# Patient Record
Sex: Female | Born: 1950 | Race: White | Hispanic: No | Marital: Married | Smoking: Current every day smoker
Health system: Southern US, Community
[De-identification: ages and names within clinical notes are randomized; demographics above are authoritative.]

## PROBLEM LIST (undated history)

## (undated) DIAGNOSIS — H9190 Unspecified hearing loss, unspecified ear: Secondary | ICD-10-CM

## (undated) DIAGNOSIS — I6529 Occlusion and stenosis of unspecified carotid artery: Secondary | ICD-10-CM

## (undated) DIAGNOSIS — K219 Gastro-esophageal reflux disease without esophagitis: Secondary | ICD-10-CM

## (undated) DIAGNOSIS — I639 Cerebral infarction, unspecified: Secondary | ICD-10-CM

## (undated) DIAGNOSIS — E785 Hyperlipidemia, unspecified: Secondary | ICD-10-CM

## (undated) DIAGNOSIS — D649 Anemia, unspecified: Secondary | ICD-10-CM

## (undated) DIAGNOSIS — G4730 Sleep apnea, unspecified: Secondary | ICD-10-CM

## (undated) DIAGNOSIS — I739 Peripheral vascular disease, unspecified: Secondary | ICD-10-CM

## (undated) DIAGNOSIS — E559 Vitamin D deficiency, unspecified: Secondary | ICD-10-CM

## (undated) DIAGNOSIS — E538 Deficiency of other specified B group vitamins: Secondary | ICD-10-CM

## (undated) HISTORY — DX: Peripheral vascular disease, unspecified: I73.9

## (undated) HISTORY — DX: Vitamin D deficiency, unspecified: E55.9

## (undated) HISTORY — PX: ABDOMINAL HYSTERECTOMY: SHX81

## (undated) HISTORY — DX: Gastro-esophageal reflux disease without esophagitis: K21.9

## (undated) HISTORY — DX: Anemia, unspecified: D64.9

## (undated) HISTORY — DX: Hyperlipidemia, unspecified: E78.5

## (undated) HISTORY — DX: Sleep apnea, unspecified: G47.30

## (undated) HISTORY — DX: Unspecified hearing loss, unspecified ear: H91.90

## (undated) HISTORY — PX: COLONOSCOPY: SHX174

## (undated) HISTORY — PX: TUBAL LIGATION: SHX77

## (undated) HISTORY — DX: Occlusion and stenosis of unspecified carotid artery: I65.29

## (undated) HISTORY — DX: Cerebral infarction, unspecified: I63.9

## (undated) HISTORY — DX: Deficiency of other specified B group vitamins: E53.8

---

## 2001-11-09 ENCOUNTER — Ambulatory Visit (HOSPITAL_COMMUNITY): Admission: RE | Admit: 2001-11-09 | Discharge: 2001-11-09 | Payer: Self-pay | Admitting: Orthopedic Surgery

## 2001-11-09 ENCOUNTER — Encounter: Payer: Self-pay | Admitting: Orthopedic Surgery

## 2015-05-09 ENCOUNTER — Encounter (HOSPITAL_COMMUNITY): Payer: Self-pay | Admitting: Anesthesiology

## 2015-05-09 ENCOUNTER — Emergency Department (HOSPITAL_COMMUNITY): Payer: Medicaid Other

## 2015-05-09 ENCOUNTER — Inpatient Hospital Stay (HOSPITAL_COMMUNITY): Payer: Medicaid Other

## 2015-05-09 ENCOUNTER — Encounter (HOSPITAL_COMMUNITY)
Admission: EM | Disposition: A | Discharge status: Rehabilitation Facility | Payer: Self-pay | Source: Home / Self Care | Attending: Neurology

## 2015-05-09 ENCOUNTER — Emergency Department (HOSPITAL_COMMUNITY): Payer: Medicaid Other | Admitting: Anesthesiology

## 2015-05-09 ENCOUNTER — Inpatient Hospital Stay (HOSPITAL_COMMUNITY)
Admission: EM | Admit: 2015-05-09 | Discharge: 2015-05-13 | DRG: 023 | Disposition: A | Discharge status: Rehabilitation Facility | Payer: Medicaid Other | Attending: Neurology | Admitting: Neurology

## 2015-05-09 DIAGNOSIS — I63512 Cerebral infarction due to unspecified occlusion or stenosis of left middle cerebral artery: Principal | ICD-10-CM | POA: Diagnosis present

## 2015-05-09 DIAGNOSIS — R29810 Facial weakness: Secondary | ICD-10-CM | POA: Diagnosis present

## 2015-05-09 DIAGNOSIS — I619 Nontraumatic intracerebral hemorrhage, unspecified: Secondary | ICD-10-CM

## 2015-05-09 DIAGNOSIS — I639 Cerebral infarction, unspecified: Secondary | ICD-10-CM

## 2015-05-09 DIAGNOSIS — E876 Hypokalemia: Secondary | ICD-10-CM | POA: Diagnosis not present

## 2015-05-09 DIAGNOSIS — I10 Essential (primary) hypertension: Secondary | ICD-10-CM | POA: Diagnosis present

## 2015-05-09 DIAGNOSIS — E669 Obesity, unspecified: Secondary | ICD-10-CM | POA: Diagnosis present

## 2015-05-09 DIAGNOSIS — I63312 Cerebral infarction due to thrombosis of left middle cerebral artery: Secondary | ICD-10-CM

## 2015-05-09 DIAGNOSIS — G8191 Hemiplegia, unspecified affecting right dominant side: Secondary | ICD-10-CM | POA: Diagnosis present

## 2015-05-09 DIAGNOSIS — J449 Chronic obstructive pulmonary disease, unspecified: Secondary | ICD-10-CM | POA: Diagnosis present

## 2015-05-09 DIAGNOSIS — Z6832 Body mass index (BMI) 32.0-32.9, adult: Secondary | ICD-10-CM | POA: Diagnosis not present

## 2015-05-09 DIAGNOSIS — R32 Unspecified urinary incontinence: Secondary | ICD-10-CM | POA: Diagnosis present

## 2015-05-09 DIAGNOSIS — Z4659 Encounter for fitting and adjustment of other gastrointestinal appliance and device: Secondary | ICD-10-CM

## 2015-05-09 DIAGNOSIS — G936 Cerebral edema: Secondary | ICD-10-CM | POA: Diagnosis present

## 2015-05-09 DIAGNOSIS — R739 Hyperglycemia, unspecified: Secondary | ICD-10-CM | POA: Diagnosis present

## 2015-05-09 DIAGNOSIS — F17210 Nicotine dependence, cigarettes, uncomplicated: Secondary | ICD-10-CM | POA: Diagnosis present

## 2015-05-09 DIAGNOSIS — R1319 Other dysphagia: Secondary | ICD-10-CM | POA: Diagnosis present

## 2015-05-09 DIAGNOSIS — Z789 Other specified health status: Secondary | ICD-10-CM

## 2015-05-09 DIAGNOSIS — J9601 Acute respiratory failure with hypoxia: Secondary | ICD-10-CM | POA: Diagnosis not present

## 2015-05-09 DIAGNOSIS — R4701 Aphasia: Secondary | ICD-10-CM | POA: Diagnosis present

## 2015-05-09 DIAGNOSIS — Z79899 Other long term (current) drug therapy: Secondary | ICD-10-CM

## 2015-05-09 DIAGNOSIS — J9600 Acute respiratory failure, unspecified whether with hypoxia or hypercapnia: Secondary | ICD-10-CM

## 2015-05-09 DIAGNOSIS — F17200 Nicotine dependence, unspecified, uncomplicated: Secondary | ICD-10-CM | POA: Diagnosis present

## 2015-05-09 DIAGNOSIS — J690 Pneumonitis due to inhalation of food and vomit: Secondary | ICD-10-CM | POA: Diagnosis not present

## 2015-05-09 DIAGNOSIS — T41295A Adverse effect of other general anesthetics, initial encounter: Secondary | ICD-10-CM | POA: Diagnosis not present

## 2015-05-09 DIAGNOSIS — R29721 NIHSS score 21: Secondary | ICD-10-CM | POA: Diagnosis present

## 2015-05-09 DIAGNOSIS — R159 Full incontinence of feces: Secondary | ICD-10-CM | POA: Diagnosis present

## 2015-05-09 DIAGNOSIS — J9690 Respiratory failure, unspecified, unspecified whether with hypoxia or hypercapnia: Secondary | ICD-10-CM

## 2015-05-09 DIAGNOSIS — I611 Nontraumatic intracerebral hemorrhage in hemisphere, cortical: Secondary | ICD-10-CM | POA: Diagnosis present

## 2015-05-09 DIAGNOSIS — R9089 Other abnormal findings on diagnostic imaging of central nervous system: Secondary | ICD-10-CM

## 2015-05-09 DIAGNOSIS — I952 Hypotension due to drugs: Secondary | ICD-10-CM | POA: Diagnosis not present

## 2015-05-09 DIAGNOSIS — K219 Gastro-esophageal reflux disease without esophagitis: Secondary | ICD-10-CM | POA: Diagnosis present

## 2015-05-09 DIAGNOSIS — I609 Nontraumatic subarachnoid hemorrhage, unspecified: Secondary | ICD-10-CM | POA: Diagnosis present

## 2015-05-09 DIAGNOSIS — I6523 Occlusion and stenosis of bilateral carotid arteries: Secondary | ICD-10-CM | POA: Diagnosis present

## 2015-05-09 DIAGNOSIS — E872 Acidosis: Secondary | ICD-10-CM | POA: Diagnosis present

## 2015-05-09 DIAGNOSIS — E870 Hyperosmolality and hypernatremia: Secondary | ICD-10-CM | POA: Diagnosis not present

## 2015-05-09 DIAGNOSIS — T45615A Adverse effect of thrombolytic drugs, initial encounter: Secondary | ICD-10-CM | POA: Diagnosis not present

## 2015-05-09 DIAGNOSIS — E785 Hyperlipidemia, unspecified: Secondary | ICD-10-CM | POA: Diagnosis present

## 2015-05-09 DIAGNOSIS — D649 Anemia, unspecified: Secondary | ICD-10-CM | POA: Diagnosis present

## 2015-05-09 DIAGNOSIS — Q251 Coarctation of aorta: Secondary | ICD-10-CM

## 2015-05-09 HISTORY — PX: RADIOLOGY WITH ANESTHESIA: SHX6223

## 2015-05-09 LAB — COMPREHENSIVE METABOLIC PANEL
ALT: 14 U/L (ref 14–54)
AST: 20 U/L (ref 15–41)
Albumin: 3.8 g/dL (ref 3.5–5.0)
Alkaline Phosphatase: 75 U/L (ref 38–126)
Anion gap: 11 (ref 5–15)
BUN: 9 mg/dL (ref 6–20)
CO2: 25 mmol/L (ref 22–32)
Calcium: 9.3 mg/dL (ref 8.9–10.3)
Chloride: 104 mmol/L (ref 101–111)
Creatinine, Ser: 0.85 mg/dL (ref 0.44–1.00)
GFR calc Af Amer: >60 mL/min (ref >60)
GFR calc non Af Amer: >60 mL/min (ref >60)
Glucose, Bld: 84 mg/dL (ref 65–99)
Potassium: 4.3 mmol/L (ref 3.5–5.1)
Sodium: 140 mmol/L (ref 135–145)
Total Bilirubin: 0.8 mg/dL (ref 0.3–1.2)
Total Protein: 6.6 g/dL (ref 6.5–8.1)

## 2015-05-09 LAB — PROTIME-INR
INR: 1.08 (ref 0.00–1.49)
Prothrombin Time: 14.2 seconds (ref 11.6–15.2)

## 2015-05-09 LAB — DIFFERENTIAL
Basophils Absolute: 0 10*3/uL (ref 0.0–0.1)
Basophils Relative: 0 %
Eosinophils Absolute: 0.1 10*3/uL (ref 0.0–0.7)
Eosinophils Relative: 1 %
Lymphocytes Relative: 38 %
Lymphs Abs: 3.5 10*3/uL (ref 0.7–4.0)
Monocytes Absolute: 0.9 10*3/uL (ref 0.1–1.0)
Monocytes Relative: 10 %
Neutro Abs: 4.6 10*3/uL (ref 1.7–7.7)
Neutrophils Relative %: 51 %

## 2015-05-09 LAB — URINALYSIS, ROUTINE W REFLEX MICROSCOPIC
Bilirubin Urine: NEGATIVE
Glucose, UA: NEGATIVE mg/dL
Hgb urine dipstick: NEGATIVE
Ketones, ur: NEGATIVE mg/dL
Leukocytes, UA: NEGATIVE
Nitrite: NEGATIVE
Protein, ur: NEGATIVE mg/dL
Specific Gravity, Urine: 1.01 (ref 1.005–1.030)
pH: 5.5 (ref 5.0–8.0)

## 2015-05-09 LAB — CBC
HCT: 47.1 % — ABNORMAL HIGH (ref 36.0–46.0)
Hemoglobin: 15.7 g/dL — ABNORMAL HIGH (ref 12.0–15.0)
MCH: 31.6 pg (ref 26.0–34.0)
MCHC: 33.3 g/dL (ref 30.0–36.0)
MCV: 94.8 fL (ref 78.0–100.0)
Platelets: 244 10*3/uL (ref 150–400)
RBC: 4.97 MIL/uL (ref 3.87–5.11)
RDW: 13.9 % (ref 11.5–15.5)
WBC: 9.1 10*3/uL (ref 4.0–10.5)

## 2015-05-09 LAB — I-STAT CHEM 8, ED
BUN: 11 mg/dL (ref 6–20)
Calcium, Ion: 1.12 mmol/L — ABNORMAL LOW (ref 1.13–1.30)
Chloride: 103 mmol/L (ref 101–111)
Creatinine, Ser: 0.9 mg/dL (ref 0.44–1.00)
Glucose, Bld: 79 mg/dL (ref 65–99)
HCT: 52 % — ABNORMAL HIGH (ref 36.0–46.0)
Hemoglobin: 17.7 g/dL — ABNORMAL HIGH (ref 12.0–15.0)
Potassium: 4.2 mmol/L (ref 3.5–5.1)
Sodium: 140 mmol/L (ref 135–145)
TCO2: 26 mmol/L (ref 0–100)

## 2015-05-09 LAB — POCT I-STAT 3, ART BLOOD GAS (G3+)
Acid-base deficit: 6 mmol/L — ABNORMAL HIGH (ref 0.0–2.0)
Bicarbonate: 20.9 mEq/L (ref 20.0–24.0)
O2 Saturation: 97 %
Patient temperature: 35.2
TCO2: 22 mmol/L (ref 0–100)
pCO2 arterial: 40.6 mmHg (ref 35.0–45.0)
pH, Arterial: 7.311 — ABNORMAL LOW (ref 7.350–7.450)
pO2, Arterial: 95 mmHg (ref 80.0–100.0)

## 2015-05-09 LAB — APTT: aPTT: 28 seconds (ref 24–37)

## 2015-05-09 LAB — RAPID URINE DRUG SCREEN, HOSP PERFORMED
Amphetamines: NOT DETECTED
Barbiturates: NOT DETECTED
Benzodiazepines: NOT DETECTED
Cocaine: NOT DETECTED
Opiates: NOT DETECTED
Tetrahydrocannabinol: NOT DETECTED

## 2015-05-09 LAB — I-STAT TROPONIN, ED: Troponin i, poc: 0 ng/mL (ref 0.00–0.08)

## 2015-05-09 LAB — MRSA PCR SCREENING: MRSA by PCR: NEGATIVE

## 2015-05-09 LAB — CBG MONITORING, ED: Glucose-Capillary: 76 mg/dL (ref 65–99)

## 2015-05-09 LAB — ETHANOL: Alcohol, Ethyl (B): <5 mg/dL (ref <5)

## 2015-05-09 LAB — GLUCOSE, CAPILLARY
Glucose-Capillary: 67 mg/dL (ref 65–99)
Glucose-Capillary: 81 mg/dL (ref 65–99)
Glucose-Capillary: 95 mg/dL (ref 65–99)

## 2015-05-09 SURGERY — RADIOLOGY WITH ANESTHESIA
Anesthesia: General

## 2015-05-09 MED ORDER — LACTATED RINGERS IV SOLN
INTRAVENOUS | Status: DC | PRN
  Administered 2015-05-09 (×2): via INTRAVENOUS

## 2015-05-09 MED ORDER — PROPOFOL 1000 MG/100ML IV EMUL
5.0000 ug/kg/min | INTRAVENOUS | Status: DC
  Administered 2015-05-09: 30 ug/kg/min via INTRAVENOUS
  Administered 2015-05-09 – 2015-05-10 (×2): 50 ug/kg/min via INTRAVENOUS
  Administered 2015-05-10: 15 ug/kg/min via INTRAVENOUS
  Administered 2015-05-10: 50 ug/kg/min via INTRAVENOUS
  Administered 2015-05-10: 30 ug/kg/min via INTRAVENOUS
  Administered 2015-05-11: 40 ug/kg/min via INTRAVENOUS
  Administered 2015-05-11: 45 ug/kg/min via INTRAVENOUS
  Filled 2015-05-09 (×9): qty 100

## 2015-05-09 MED ORDER — CEFAZOLIN SODIUM-DEXTROSE 2-3 GM-% IV SOLR
INTRAVENOUS | Status: AC
  Filled 2015-05-09: qty 50

## 2015-05-09 MED ORDER — FENTANYL CITRATE (PF) 250 MCG/5ML IJ SOLN
INTRAMUSCULAR | Status: AC
  Filled 2015-05-09: qty 5

## 2015-05-09 MED ORDER — ALTEPLASE (STROKE) FULL DOSE INFUSION
0.9000 mg/kg | Freq: Once | INTRAVENOUS | Status: AC
  Administered 2015-05-09: 75 mg via INTRAVENOUS
  Filled 2015-05-09: qty 100

## 2015-05-09 MED ORDER — ASPIRIN 325 MG PO TABS
325.0000 mg | ORAL_TABLET | Freq: Once | ORAL | Status: AC
  Administered 2015-05-09: 325 mg via ORAL

## 2015-05-09 MED ORDER — CLOPIDOGREL BISULFATE 300 MG PO TABS
300.0000 mg | ORAL_TABLET | Freq: Once | ORAL | Status: AC
  Administered 2015-05-09: 300 mg via ORAL

## 2015-05-09 MED ORDER — FENTANYL CITRATE (PF) 100 MCG/2ML IJ SOLN
25.0000 ug | INTRAMUSCULAR | Status: DC | PRN
  Administered 2015-05-09 – 2015-05-10 (×4): 50 ug via INTRAVENOUS
  Filled 2015-05-09 (×4): qty 2

## 2015-05-09 MED ORDER — ASPIRIN 325 MG PO TABS
ORAL_TABLET | ORAL | Status: AC
  Administered 2015-05-09: 325 mg via ORAL
  Filled 2015-05-09: qty 1

## 2015-05-09 MED ORDER — ACETAMINOPHEN 325 MG PO TABS: 650.0000 mg | ORAL_TABLET | ORAL | Status: DC | PRN

## 2015-05-09 MED ORDER — ETOMIDATE 2 MG/ML IV SOLN
INTRAVENOUS | Status: DC | PRN
  Administered 2015-05-09: 20 mg via INTRAVENOUS

## 2015-05-09 MED ORDER — ANTISEPTIC ORAL RINSE SOLUTION (CORINZ)
7.0000 mL | OROMUCOSAL | Status: DC
  Administered 2015-05-09 – 2015-05-11 (×15): 7 mL via OROMUCOSAL

## 2015-05-09 MED ORDER — CLOPIDOGREL BISULFATE 75 MG PO TABS
75.0000 mg | ORAL_TABLET | Freq: Every day | ORAL | Status: DC
  Administered 2015-05-10 – 2015-05-13 (×3): 75 mg via ORAL
  Filled 2015-05-09 (×4): qty 1

## 2015-05-09 MED ORDER — SODIUM CHLORIDE 0.9 % IV SOLN
INTRAVENOUS | Status: DC
  Administered 2015-05-09: 18:00:00 via INTRAVENOUS
  Administered 2015-05-10: 1000 mL via INTRAVENOUS
  Administered 2015-05-12: 100 mL/h via INTRAVENOUS
  Administered 2015-05-12 (×2): via INTRAVENOUS

## 2015-05-09 MED ORDER — NIMODIPINE 60 MG/20ML PO SOLN
30.0000 mg | Freq: Four times a day (QID) | ORAL | Status: DC
  Administered 2015-05-09 – 2015-05-11 (×7): 30 mg
  Filled 2015-05-09 (×7): qty 20

## 2015-05-09 MED ORDER — ACETAMINOPHEN 650 MG RE SUPP: 650.0000 mg | Freq: Four times a day (QID) | RECTAL | Status: DC | PRN

## 2015-05-09 MED ORDER — EPTIFIBATIDE 20 MG/10ML IV SOLN
INTRAVENOUS | Status: AC
  Administered 2015-05-09: 20 mg
  Filled 2015-05-09: qty 10

## 2015-05-09 MED ORDER — SODIUM CHLORIDE 0.9 % IV SOLN
3.0000 g | Freq: Four times a day (QID) | INTRAVENOUS | Status: DC
  Administered 2015-05-09 – 2015-05-13 (×15): 3 g via INTRAVENOUS
  Filled 2015-05-09 (×20): qty 3

## 2015-05-09 MED ORDER — CHLORHEXIDINE GLUCONATE 0.12% ORAL RINSE (MEDLINE KIT): 15.0000 mL | Freq: Two times a day (BID) | OROMUCOSAL | Status: DC

## 2015-05-09 MED ORDER — ASPIRIN 325 MG PO TABS
325.0000 mg | ORAL_TABLET | Freq: Every day | ORAL | Status: DC
  Administered 2015-05-10 – 2015-05-13 (×3): 325 mg via ORAL
  Filled 2015-05-09 (×4): qty 1

## 2015-05-09 MED ORDER — STROKE: EARLY STAGES OF RECOVERY BOOK
Freq: Once | Status: AC
  Administered 2015-05-09: 18:00:00
  Filled 2015-05-09: qty 1

## 2015-05-09 MED ORDER — IOHEXOL 300 MG/ML  SOLN
400.0000 mL | Freq: Once | INTRAMUSCULAR | Status: AC | PRN
  Administered 2015-05-09: 225 mL via INTRA_ARTERIAL

## 2015-05-09 MED ORDER — DEXTROSE 50 % IV SOLN
25.0000 mL | Freq: Once | INTRAVENOUS | Status: AC
  Administered 2015-05-09: 25 mL via INTRAVENOUS

## 2015-05-09 MED ORDER — ALTEPLASE (STROKE) FULL DOSE INFUSION
0.9000 mg/kg | Freq: Once | INTRAVENOUS | Status: DC
  Filled 2015-05-09: qty 100

## 2015-05-09 MED ORDER — DEXTROSE 5 % IV SOLN
0.0000 ug/min | INTRAVENOUS | Status: DC
  Administered 2015-05-09: 20 ug/min via INTRAVENOUS
  Administered 2015-05-10: 25 ug/min via INTRAVENOUS
  Administered 2015-05-10: 40 ug/min via INTRAVENOUS
  Administered 2015-05-10: 45 ug/min via INTRAVENOUS
  Administered 2015-05-11: 2 ug/min via INTRAVENOUS
  Filled 2015-05-09 (×5): qty 1

## 2015-05-09 MED ORDER — MIDAZOLAM HCL 2 MG/2ML IJ SOLN: 1.0000 mg | INTRAMUSCULAR | Status: DC | PRN

## 2015-05-09 MED ORDER — SODIUM CHLORIDE 0.9 % IV SOLN: INTRAVENOUS | Status: DC

## 2015-05-09 MED ORDER — PROPOFOL 10 MG/ML IV BOLUS
INTRAVENOUS | Status: DC | PRN
  Administered 2015-05-09: 30 mg via INTRAVENOUS

## 2015-05-09 MED ORDER — EPHEDRINE SULFATE 50 MG/ML IJ SOLN
INTRAMUSCULAR | Status: DC | PRN
  Administered 2015-05-09 (×2): 10 mg via INTRAVENOUS
  Administered 2015-05-09 (×3): 5 mg via INTRAVENOUS

## 2015-05-09 MED ORDER — ROCURONIUM BROMIDE 100 MG/10ML IV SOLN
INTRAVENOUS | Status: DC | PRN
  Administered 2015-05-09: 40 mg via INTRAVENOUS
  Administered 2015-05-09: 50 mg via INTRAVENOUS
  Administered 2015-05-09: 10 mg via INTRAVENOUS

## 2015-05-09 MED ORDER — CEFAZOLIN SODIUM-DEXTROSE 2-3 GM-% IV SOLR
INTRAVENOUS | Status: DC | PRN
  Administered 2015-05-09 (×2): 2 g via INTRAVENOUS

## 2015-05-09 MED ORDER — PROPOFOL 500 MG/50ML IV EMUL
INTRAVENOUS | Status: DC | PRN
  Administered 2015-05-09: 50 ug/kg/min via INTRAVENOUS

## 2015-05-09 MED ORDER — ACETAMINOPHEN 500 MG PO TABS: 1000.0000 mg | ORAL_TABLET | Freq: Four times a day (QID) | ORAL | Status: DC | PRN

## 2015-05-09 MED ORDER — IOHEXOL 350 MG/ML SOLN
100.0000 mL | Freq: Once | INTRAVENOUS | Status: AC | PRN
  Administered 2015-05-09: 100 mL via INTRAVENOUS

## 2015-05-09 MED ORDER — DEXTROSE 50 % IV SOLN
INTRAVENOUS | Status: DC | PRN
  Administered 2015-05-09: 25 g via INTRAVENOUS

## 2015-05-09 MED ORDER — ANTISEPTIC ORAL RINSE SOLUTION (CORINZ): 7.0000 mL | Freq: Four times a day (QID) | OROMUCOSAL | Status: DC

## 2015-05-09 MED ORDER — LABETALOL HCL 5 MG/ML IV SOLN: 10.0000 mg | INTRAVENOUS | Status: DC | PRN

## 2015-05-09 MED ORDER — ROCURONIUM BROMIDE 50 MG/5ML IV SOLN
INTRAVENOUS | Status: DC | PRN
  Administered 2015-05-09: 100 mg via INTRAVENOUS

## 2015-05-09 MED ORDER — CEFAZOLIN SODIUM-DEXTROSE 2-3 GM-% IV SOLR
INTRAVENOUS | Status: AC
Start: 2015-05-09 — End: 2015-05-10
  Filled 2015-05-09: qty 50

## 2015-05-09 MED ORDER — NICARDIPINE HCL IN NACL 20-0.86 MG/200ML-% IV SOLN: 5.0000 mg/h | INTRAVENOUS | Status: DC

## 2015-05-09 MED ORDER — PHENYLEPHRINE HCL 10 MG/ML IJ SOLN
10.0000 mg | INTRAVENOUS | Status: DC | PRN
  Administered 2015-05-09: 40 ug/min via INTRAVENOUS

## 2015-05-09 MED ORDER — PANTOPRAZOLE SODIUM 40 MG IV SOLR
40.0000 mg | Freq: Every day | INTRAVENOUS | Status: DC
  Administered 2015-05-09 – 2015-05-12 (×4): 40 mg via INTRAVENOUS
  Filled 2015-05-09 (×4): qty 40

## 2015-05-09 MED ORDER — SODIUM CHLORIDE 0.9 % IV SOLN: 50.0000 mL | Freq: Once | INTRAVENOUS | Status: AC

## 2015-05-09 MED ORDER — NITROGLYCERIN 1 MG/10 ML FOR IR/CATH LAB
INTRA_ARTERIAL | Status: AC
  Administered 2015-05-09: 75 ug
  Administered 2015-05-09 (×3): 25 ug
  Administered 2015-05-09: 50 ug
  Filled 2015-05-09: qty 10

## 2015-05-09 MED ORDER — ALTEPLASE 30 MG/30 ML FOR INTERV. RAD
1.0000 mg | INTRA_ARTERIAL | Status: AC | PRN
  Administered 2015-05-09 (×3): 5 mg via INTRA_ARTERIAL
  Filled 2015-05-09: qty 30

## 2015-05-09 MED ORDER — SENNOSIDES-DOCUSATE SODIUM 8.6-50 MG PO TABS: 1.0000 | ORAL_TABLET | Freq: Every evening | ORAL | Status: DC | PRN

## 2015-05-09 MED ORDER — CHLORHEXIDINE GLUCONATE 0.12% ORAL RINSE (MEDLINE KIT)
15.0000 mL | Freq: Two times a day (BID) | OROMUCOSAL | Status: DC
  Administered 2015-05-09 – 2015-05-13 (×8): 15 mL via OROMUCOSAL

## 2015-05-09 MED ORDER — FENTANYL CITRATE (PF) 100 MCG/2ML IJ SOLN
INTRAMUSCULAR | Status: DC | PRN
  Administered 2015-05-09: 50 ug via INTRAVENOUS
  Administered 2015-05-09: 100 ug via INTRAVENOUS
  Administered 2015-05-09: 50 ug via INTRAVENOUS

## 2015-05-09 MED ORDER — SODIUM CHLORIDE 0.9 % IV BOLUS (SEPSIS)
500.0000 mL | Freq: Once | INTRAVENOUS | Status: AC
  Administered 2015-05-09: 500 mL via INTRAVENOUS

## 2015-05-09 MED ORDER — CLOPIDOGREL BISULFATE 75 MG PO TABS
ORAL_TABLET | ORAL | Status: AC
  Administered 2015-05-09: 300 mg via ORAL
  Filled 2015-05-09: qty 4

## 2015-05-09 MED ORDER — DEXTROSE 50 % IV SOLN
25.0000 mL | Freq: Once | INTRAVENOUS | Status: AC
  Administered 2015-05-10: 25 mL via INTRAVENOUS
  Filled 2015-05-09 (×2): qty 50

## 2015-05-09 MED ORDER — ABCIXIMAB 2 MG/ML IV SOLN
10.0000 mg | INTRAVENOUS | Status: AC
  Administered 2015-05-09: 4 mg via INTRAVENOUS
  Administered 2015-05-09: 2 mg via INTRAVENOUS
  Filled 2015-05-09: qty 5

## 2015-05-09 MED ORDER — ONDANSETRON HCL 4 MG/2ML IJ SOLN
4.0000 mg | Freq: Four times a day (QID) | INTRAMUSCULAR | Status: DC | PRN
Start: 2015-05-09 — End: 2015-05-13
  Administered 2015-05-10: 4 mg via INTRAVENOUS
  Filled 2015-05-09: qty 2

## 2015-05-09 MED ORDER — ACETAMINOPHEN 650 MG RE SUPP
650.0000 mg | RECTAL | Status: DC | PRN
  Administered 2015-05-10: 650 mg via RECTAL
  Filled 2015-05-09: qty 1

## 2015-05-09 NOTE — Procedures (Signed)
S/P innominate artery angiogram,Lt common carotid arteriogram,Lt Vert angiogram,followed by complete revascularization of occluded sup division Lt ICA using 10 mg of superselective intracranial tpa and x 1 pass with Solitaire 51mm x 45mm retrieval device ,and complete revascularization of symptomatic occluded  LT ICA prox with stentassisted angioplasty and rescue fibrinilysis of intrastent thrombus with 20 mg  Of  IA Integrelin ,5mg  of IA TPA and 6 mg of IA Reopro

## 2015-05-09 NOTE — Progress Notes (Signed)
[place] Progress Note Patient Name: Monica Pugh DOB: [birth year] MRN: [id number]   Date of Service  05/09/2015  HPI/Events of Note  Camera check on New Admission to neuro intensive care unit. Patient experienced hypotension with propofol infusion. Only has peripheral IV access at this time. Propofol infusion stopped by nursing staff at bedside.  eICU Interventions  1. Continue to hold propofol infusion 2. Versed & fentanyl IV when necessary for sedation 3. Normal saline 500 mL bolus IV     Intervention Category Major Interventions: Hypotension - evaluation and management  Tera Partridge 05/09/2015, 5:59 PM

## 2015-05-09 NOTE — Transfer of Care (Signed)
Immediate Anesthesia Transfer of Care Note  Patient: Monica Pugh  Procedure(s) Performed: Procedure(s): RADIOLOGY WITH ANESTHESIA (N/A)  Patient Location: ICU  Anesthesia Type:General  Level of Consciousness: sedated, unresponsive and Patient remains intubated per anesthesia plan  Airway & Oxygen Therapy: Patient remains intubated per anesthesia plan and Patient placed on Ventilator (see vital sign flow sheet for setting)  Post-op Assessment: Report given to RN and Post -op Vital signs reviewed and stable  Post vital signs: Reviewed and stable  Last Vitals:  Filed Vitals:   05/09/15 1230 05/09/15 1245  BP: 134/76 110/78  Pulse: 81 88  Temp: 35.7 C   Resp: 17 14    Complications: No apparent anesthesia complications

## 2015-05-09 NOTE — ED Provider Notes (Signed)
CSN: [id number]     Arrival date & time 05/09/15  1137 History   First MD Initiated Contact with Patient 05/09/15 1150     Chief Complaint  Patient presents with  . Code Stroke     (Consider location/radiation/quality/duration/timing/severity/associated sxs/prior Treatment) HPI Comments: Level V caveat for altered mental status. Code stroke brought in by EMS. Last seen normal around 10 AM. Patient is aphasic and not responding to pain. She's had no spontaneous movement for EMS will not follow commands. CBG was 97. Only history is acid reflux. No fever or recent illnesses.  The history is provided by the patient and the EMS personnel. The history is limited by the condition of the patient.    No past medical history on file. No past surgical history on file. No family history on file. Social History  Substance Use Topics  . Smoking status: Not on file  . Smokeless tobacco: Not on file  . Alcohol Use: Not on file   OB History    No data available     Review of Systems  Unable to perform ROS: Acuity of condition      Allergies  Review of patient's allergies indicates not on file.  Home Medications   Prior to Admission medications   Medication Sig Start Date End Date Taking? Authorizing Provider  acetaminophen (TYLENOL) 325 MG tablet Take 650 mg by mouth every 6 (six) hours as needed.   Yes Historical Provider, MD  pantoprazole (PROTONIX) 40 MG tablet Take 40 mg by mouth 2 (two) times daily. 03/24/15  Yes Historical Provider, MD   BP 134/76 mmHg  Pulse 81  Temp(Src) 96.3 F (35.7 C) (Core (Comment))  Resp 17  Ht 5\' 6"  (1.676 m)  Wt 184 lb 1.4 oz (83.5 kg)  BMI 29.73 kg/m2  SpO2 100% Physical Exam  Constitutional: She appears well-developed and well-nourished. No distress.  Obtunded, does not respond to pain or follow commands  HENT:  Head: Normocephalic and atraumatic.  Mouth/Throat: Oropharynx is clear and moist. No oropharyngeal exudate.  Eyes: Conjunctivae and  EOM are normal. Pupils are equal, round, and reactive to light.  Neck: Normal range of motion. Neck supple.  Cardiovascular: Normal rate, regular rhythm and normal heart sounds.   No murmur heard. Pulmonary/Chest: Effort normal and breath sounds normal. She exhibits no tenderness.  Abdominal: Soft. There is no tenderness. There is no rebound and no guarding.  Genitourinary:  Incontinent of stool and urine  Musculoskeletal: Normal range of motion. She exhibits no edema.  Neurological: She is alert.  Aphasic, minimal response to painful stimuli, no spontaneous movement. Does not localize to pain. Does not follow commands    ED Course  .Intubation Date/Time: 05/09/2015 12:13 PM Performed by: Ezequiel Essex Authorized by: Ezequiel Essex Consent: The procedure was performed in an emergent situation. Verbal consent obtained. Risks and benefits: risks, benefits and alternatives were discussed Consent given by: patient and spouse Patient understanding: patient states understanding of the procedure being performed Patient identity confirmed: provided demographic data and verbally with patient Indications: respiratory failure and  airway protection Patient status: paralyzed (RSI) Sedatives: etomidate Paralytic: rocuronium Laryngoscope size: Mac 4 Tube size: 7.5 mm Tube type: cuffed Number of attempts: 1 Cricoid pressure: no Cords visualized: yes Post-procedure assessment: chest rise Breath sounds: equal Cuff inflated: yes ETT to lip: 24 cm Tube secured with: ETT holder Chest x-ray findings: endotracheal tube in appropriate position Patient tolerance: Patient tolerated the procedure well with no immediate complications   (including  critical care time) Labs Review Labs Reviewed  CBC - Abnormal; Notable for the following:    Hemoglobin 15.7 (*)    HCT 47.1 (*)    All other components within normal limits  I-STAT CHEM 8, ED - Abnormal; Notable for the following:    Calcium, Ion  1.12 (*)    Hemoglobin 17.7 (*)    HCT 52.0 (*)    All other components within normal limits  ETHANOL  DIFFERENTIAL  COMPREHENSIVE METABOLIC PANEL  URINE RAPID DRUG SCREEN, HOSP PERFORMED  URINALYSIS, ROUTINE W REFLEX MICROSCOPIC (NOT AT [facility])  PROTIME-INR  APTT  I-STAT TROPOININ, ED  CBG MONITORING, ED    Imaging Review Ct Head Wo Contrast  05/09/2015  CLINICAL DATA:  Unresponsive/altered mental status EXAM: CT HEAD WITHOUT CONTRAST TECHNIQUE: Contiguous axial images were obtained from the base of the skull through the vertex without intravenous contrast. COMPARISON:  June 07, 2013 FINDINGS: The ventricles are normal in size and configuration. There is a focal somewhat ovoid area of decreased attenuation in the medial left frontal region, appreciable on axial slices 21, 22, 23, and 24 series 2. This area measures approximately 2 x 1 cm. It is difficult to ascertain whether this area represents a somewhat atypical infarct versus small mass. This finding was not present on prior study. There is no hemorrhage, extra-axial fluid collection, or midline shift. Elsewhere, there is minimal periventricular small vessel disease in the centra semiovale bilaterally. There is increased attenuation in the left middle cerebral artery compared to the right, concerning for potential thrombus in this area. The bony calvarium appears intact. The mastoid air cells are clear. There is opacification of a posterior ethmoid air cell on the left. No intraorbital lesions are identified. IMPRESSION: Increased attenuation in the left middle cerebral artery compared to the right side. This finding is concerning for early acute infarct in the left middle cerebral artery distribution. Somewhat unusual appearing area of decreased attenuation in the medial left frontal lobe. This area may warrant pre and post intravenous contrast brain MRI to assess for possible atypical mass in this area. Atypical infarct in this area is  possible, but the distribution and appearance is somewhat unusual for infarct. Critical Value/emergent results were called by telephone at the time of interpretation on 05/09/2015 at 12:01 pm to Dr. Silverio Decamp, neurology, who verbally acknowledged these results. Electronically Signed   By: Lowella Grip III M.D.   On: 05/09/2015 12:02   Dg Chest Portable 1 View  05/09/2015  CLINICAL DATA:  Evaluate ETT. EXAM: PORTABLE CHEST 1 VIEW COMPARISON:  June 07, 2013 FINDINGS: The ETT terminates 1 cm above the carina. Recommend withdrawing 1.5 cm. No pneumothorax. NG tube terminates below today's film. No pulmonary nodules or masses. No focal infiltrates. Stable cardiomediastinal silhouette. Mild prominence of the interstitium could be due to low volumes or portable technique. A PA and lateral chest x-ray could better evaluate. IMPRESSION: 1. The ET tube is low lying but does not extend into either mainstem bronchus. Recommend withdrawing 1 or 1.5 cm. 2. Mild interstitial prominence may be due to technique. Recommend attention on follow-up Electronically Signed   By: Dorise Bullion III M.D   On: 05/09/2015 12:54   Dg Abd Portable 1v  05/09/2015  CLINICAL DATA:  Enteric tube placement EXAM: PORTABLE ABDOMEN - 1 VIEW COMPARISON:  01/31/2014 CT abdomen/pelvis FINDINGS: Enteric tube terminates in the gastric fundus. No dilated small bowel loops. No evidence of pneumatosis or pneumoperitoneum. No pathologic soft tissue calcifications. IMPRESSION:  Enteric tube terminates in the gastric fundus. Nonobstructive bowel gas pattern. Electronically Signed   By: Ilona Sorrel M.D.   On: 05/09/2015 12:50   I have personally reviewed and evaluated these images and lab results as part of my medical decision-making.   EKG Interpretation   Date/Time:  Saturday May 09 2015 12:12:52 EDT Ventricular Rate:  105 PR Interval:  166 QRS Duration: 73 QT Interval:  342 QTC Calculation: 452 R Axis:   77 Text Interpretation:  Sinus  tachycardia Probable left atrial enlargement  Repol abnrm suggests ischemia, inferior leads Borderline ST elevation,  lateral leads Baseline wander in lead(s) II III aVF No previous ECGs  available Artifact Confirmed by Wyvonnia Dusky  MD, [place] [phone]) on 05/09/2015  12:27:41 PM      MDM   Final diagnoses:  Acute ischemic left MCA stroke ([place])  Acute respiratory failure with hypoxia ([facility])   Code stroke on arrival. Patient protecting airway on arrival with some localization of pain. Taken to CT scan. there is a acute left MCA stroke without hemorrhage.   Patient is a TPA candidate. LSN 10 am. Dr. Silverio Decamp d/w family at bedside.  They're agreeable to TPA. Patient is less responsive after returning from CT. CBG 79. She is having some trouble with her secretions. She is intubated for airway protection. Neurology plans to take to CT angiogram and then interventional radiology.   Discussed with Dr. Lamonte Sakai from critical care who will admit patient to ICU after she returns from IR. Husband and family updated.  BP stable in the ED.  CRITICAL CARE Performed by: Ezequiel Essex Total critical care time: 42 minutes Critical care time was exclusive of separately billable procedures and treating other patients. Critical care was necessary to treat or prevent imminent or life-threatening deterioration. Critical care was time spent personally by me on the following activities: development of treatment plan with patient and/or surrogate as well as nursing, discussions with consultants, evaluation of patient's response to treatment, examination of patient, obtaining history from patient or surrogate, ordering and performing treatments and interventions, ordering and review of laboratory studies, ordering and review of radiographic studies, pulse oximetry and re-evaluation of patient's condition.   Ezequiel Essex, MD 05/09/15 [phone]

## 2015-05-09 NOTE — Progress Notes (Signed)
Orthopedic Tech Progress Note Patient Details:  Monica Pugh November 07, 1950 [id number]  Ortho Devices Type of Ortho Device: Soft collar Ortho Device/Splint Location: neck Ortho Device/Splint Interventions: Ordered, Application   Braulio Bosch 05/09/2015, 5:54 PM

## 2015-05-09 NOTE — Anesthesia Preprocedure Evaluation (Signed)
Anesthesia Evaluation  Patient identified by MRN, date of birth, ID band Patient unresponsive  Preop documentation limited or incomplete due to emergent nature of procedure.  Airway Mallampati: Intubated       Dental   Pulmonary    breath sounds clear to auscultation       Cardiovascular  Rhythm:Regular Rate:Normal     Neuro/Psych    GI/Hepatic GERD  Medicated,  Endo/Other    Renal/GU      Musculoskeletal   Abdominal   Peds  Hematology   Anesthesia Other Findings Pt brought to the Neuro radiology suite intubated.  Reproductive/Obstetrics                             Anesthesia Physical Anesthesia Plan  ASA: IV  Anesthesia Plan: General   Post-op Pain Management:    Induction: Intravenous  Airway Management Planned: Oral ETT  Additional Equipment: Arterial line  Intra-op Plan:   Post-operative Plan: Post-operative intubation/ventilation  Informed Consent: I have reviewed the patients History and Physical, chart, labs and discussed the procedure including the risks, benefits and alternatives for the proposed anesthesia with the patient or authorized representative who has indicated his/her understanding and acceptance.     Plan Discussed with: CRNA, Anesthesiologist and Surgeon  Anesthesia Plan Comments: (Pt family unavailable. Will proceed with anesthesia plan for emergent intervention.)        Anesthesia Quick Evaluation

## 2015-05-09 NOTE — Progress Notes (Signed)
R. Femoral Art line not transducing properly. Waveform inappropriate. Rezeroed, flushed. No improvement.  Notified Dr. Estanislado Pandy. Site clean/dry/intact. No signs of bleeding. Level 0. R. Dorsalis pedis pulse +1 and leg warm.   Also notified Dr. Estanislado Pandy that a-line and BP cuff not correlating. Orders given to go by a-line at this time.   Will continue to monitor.

## 2015-05-09 NOTE — Progress Notes (Signed)
RT note-ETT original placement 25cm, post xray, ETT pulled back to 23cm. BBS equal, patient taken to IR with ventilator on standby.

## 2015-05-09 NOTE — H&P (Signed)
Requesting Physician: Dr.  Wyvonnia Dusky    Reason for consultation: Stroke code  HPI:                                                                                                                                         Monica Pugh is an 65 y.o. female patient who was brought in via EMS for evaluation of altered mental status and unresponsiveness. Patient with no symptom past medical history. Does not take any medications at home except for occasional acid pills. Her has been last spoke to the patient at 10 AM, which is the time she was last seen normal. When he returned home after some shopping errands, he found her unresponsive, nonverbal and not following commands. EMS were called and brought her to the ER.   EMS Arrival date & time 05/09/15 1137 Patient arrived in the ER room after CT at 1155 IV TPA initiated at 1205   Date last known well:  05/09/2015 Time last known well:  10 am tPA Given: Yes  Stroke Risk Factors - none  Past Medical History: No past medical history on file.  No past surgical history on file.  Family History: No family history on file.  Social History:   has no tobacco, alcohol, and drug history on file.  Allergies:  Allergies not on file   Medications:                                                                                                                         Current facility-administered medications:  .  alteplase (ACTIVASE) 1 mg/mL infusion 75 mg, 0.9 mg/kg, Intravenous, Once, Ram Fuller Mandril, MD  Current outpatient prescriptions:  .  acetaminophen (TYLENOL) 325 MG tablet, Take 650 mg by mouth every 6 (six) hours as needed., Disp: , Rfl:  .  pantoprazole (PROTONIX) 40 MG tablet, Take 40 mg by mouth 2 (two) times daily., Disp: , Rfl: 2   ROS:  History unobtainable from patient due to mental  status  Neurologic Examination:                                                                                                    Today's Vitals   05/09/15 1100  Weight: 83.5 kg (184 lb 1.4 oz)    Evaluation of higher integrative functions including: Level of alertness: Very drowsy, right neglect Not  Oriented to time, place and person Speech: Nonverbal, global aphasia   Test the following cranial nerves: Limited evaluation, no gross abnormality is noted , she does have a left gaze preference.  Motor examination: Moderate severe weakness in right upper extremity, able to sustain antigravity , easily withdraws in in left upper and lower extremities,  asymmetric reduced withdrawal response in the right lower extremity Examination of sensation : Reduced response to stimulation in the right lower extent compared to left side  Lab Results: Basic Metabolic Panel:  Recent Labs Lab 05/09/15 1201  NA 140  K 4.2  CL 103  GLUCOSE 79  BUN 11  CREATININE 0.90    Liver Function Tests: No results for input(s): AST, ALT, ALKPHOS, BILITOT, PROT, ALBUMIN in the last 168 hours. No results for input(s): LIPASE, AMYLASE in the last 168 hours. No results for input(s): AMMONIA in the last 168 hours.  CBC:  Recent Labs Lab 05/09/15 1152 05/09/15 1201  WBC 9.1  --   NEUTROABS 4.6  --   HGB 15.7* 17.7*  HCT 47.1* 52.0*  MCV 94.8  --   PLT 244  --     Cardiac Enzymes: No results for input(s): CKTOTAL, CKMB, CKMBINDEX, TROPONINI in the last 168 hours.  Lipid Panel: No results for input(s): CHOL, TRIG, HDL, CHOLHDL, VLDL, LDLCALC in the last 168 hours.  CBG: No results for input(s): GLUCAP in the last 168 hours.  Microbiology: No results found for this or any previous visit.   Imaging: No results found.    Assessment and plan:   Monica Pugh is an 65 y.o. female patient who presented withWith symptoms of aphasia, right neglect and evidence of right sensorimotor deficits, all of  which are suggestive for left MCA stroke. CT of the head was negative for any acute bleed. No contraindications to IV TPA as per history obtained from her husband.  She is within the TPA window. Risks/benefits were discussed with patient's husband who agreed to proceed with the TPA. Patient had difficulty protecting her airway. She was intubated prior to IV TPA, obtained to peripheral IV lines and Foley was placed. Following these procedures, IV TPA was initiated at 12:05 PM at about 2 hours and 5 minutes since last normal. NIHSS 21. Door to needle time of about 28 minutes.   Following this, she was taken for a stat CT angiogram of the head and neck with perfusion study, which showed chronic right ICA occlusion, possible acute proximal left ICA occlusion with reconstitution of the proximal left MCA and occlusion of the left M2 branch, with a perfusion that should noted on CT perfusion study. She was taken for IR  thrombectomy procedure. Complete recannulization of the left internal carotid and left MCA were obtained with a stent placed in the proximal internal carotid.   Postprocedure CT of the brain showed a left medial frontal intraparenchymal hemorrhage with left frontal subarachnoid hemorrhage.  Patient is transported to neuro ICU, intubated and sedated with propofol goal systolic blood pressure will be between 120 to 130, she is on IV fluids currently Due to subarachnoid hemorrhage noted on CT brain, recommend starting him on to pain 30 mg every 6 hours.  Frequent neuro checks, and post TPA care per protocol.  We will obtain brain MRI study in stable tomorrow, and post TPA 24 hrs CT scan of the head.   Stroke team will continue to follow starting tomorrow morning.

## 2015-05-09 NOTE — Consult Note (Signed)
PULMONARY / CRITICAL CARE MEDICINE   Name: Monica Pugh MRN: [id number] DOB: 1951/01/20    ADMISSION DATE:  05/09/2015 CONSULTATION DATE:  05/09/15  REFERRING MD:  Silverio Decamp  CHIEF COMPLAINT:  VDRF  HISTORY OF PRESENT ILLNESS:   65 yo woman, very little available currently about her hx. She was last seen normal around 10:00, was then found aphasic, unresponsive with facial droop and difficulty managing secretions by husband at 11:15. Was taken to [place] ED and then tx to [facility]. t-PA was started at 12:05. She was intubated in ED for airway protection, went to IR for angio and clot retrieval.   PAST MEDICAL HISTORY :  She  has no past medical history on file.  PAST SURGICAL HISTORY: She  has no past surgical history on file.  Not on File  No current facility-administered medications on file prior to encounter.   No current outpatient prescriptions on file prior to encounter.    FAMILY HISTORY:  Her has no family status information on file.   SOCIAL HISTORY: She    REVIEW OF SYSTEMS:   Unable to obtain  SUBJECTIVE:  Unable to obtain  VITAL SIGNS: BP 111/39 mmHg  Pulse 78  Temp(Src) 95.4 F (35.2 C) (Core (Comment))  Resp 16  Ht 5\' 6"  (1.676 m)  Wt 84.9 kg (187 lb 2.7 oz)  BMI 30.22 kg/m2  SpO2 100%  HEMODYNAMICS:    VENTILATOR SETTINGS: Vent Mode:  [-] PRVC FiO2 (%):  [50 %] 50 % Set Rate:  [14 bmp] 14 bmp Vt Set:  [500 mL] 500 mL PEEP:  [5 cmH20] 5 cmH20 Plateau Pressure:  [17 cmH20-21 cmH20] 21 cmH20  INTAKE / OUTPUT:    PHYSICAL EXAMINATION: General:  Elderly woman,  Neuro:  Sedated, grimace w stim and suctioning HEENT:  Soft C collar on, ETT in place, PERRL Cardiovascular:  Regular, no M Lungs:  Coarse B  Abdomen:  Soft, benign, + BS Musculoskeletal:  No lesions noted Skin:  Warm, no rash  LABS:  BMET  Recent Labs Lab 05/09/15 1152 05/09/15 1201  NA 140 140  K 4.3 4.2  CL 104 103  CO2 25  --   BUN 9 11  CREATININE 0.85 0.90  GLUCOSE  84 79    Electrolytes  Recent Labs Lab 05/09/15 1152  CALCIUM 9.3    CBC  Recent Labs Lab 05/09/15 1152 05/09/15 1201  WBC 9.1  --   HGB 15.7* 17.7*  HCT 47.1* 52.0*  PLT 244  --     Coag's  Recent Labs Lab 05/09/15 1218  APTT 28  INR 1.08    Sepsis Markers No results for input(s): LATICACIDVEN, PROCALCITON, O2SATVEN in the last 168 hours.  ABG  Recent Labs Lab 05/09/15 1801  PHART 7.311*  PCO2ART 40.6  PO2ART 95.0    Liver Enzymes  Recent Labs Lab 05/09/15 1152  AST 20  ALT 14  ALKPHOS 75  BILITOT 0.8  ALBUMIN 3.8    Cardiac Enzymes No results for input(s): TROPONINI, PROBNP in the last 168 hours.  Glucose  Recent Labs Lab 05/09/15 1159  GLUCAP 76    Imaging Ct Angio Head W/cm &/or Wo Cm  05/09/2015  CLINICAL DATA:  Stroke. EXAM: CT ANGIOGRAPHY HEAD AND NECK TECHNIQUE: Multidetector CT imaging of the head and neck was performed using the standard protocol during bolus administration of intravenous contrast. Multiplanar CT image reconstructions and MIPs were obtained to evaluate the vascular anatomy. Carotid stenosis measurements (when applicable) are obtained utilizing NASCET  criteria, using the distal internal carotid diameter as the denominator. CONTRAST:  175mL OMNIPAQUE IOHEXOL 350 MG/ML SOLN COMPARISON:  CT head 05/09/2015 FINDINGS: CTA NECK Aortic arch: Mild atherosclerotic disease aortic arch. No dissection. Critical stenosis of the innominate artery. Right subclavian artery patent. Diffuse disease in the proximal right common carotid artery which occludes proximally. This may be chronic. Proximal left subclavian artery patent. Proximal left common carotid artery patent with mild atherosclerotic disease. Mild atelectasis or infiltrate in the posterior lungs bilaterally. The patient is intubated with the endotracheal tube in the right main bronchus. Recommend withdrawal 3 cm. NG tube in the esophagus. Right carotid system: Right common  carotid artery is extensively diseased and occludes proximally. Right external carotid branches reconstitute via collaterals. Right internal carotid artery is occluded through the cavernous segment. Left carotid system: Left common carotid artery patent with mild atherosclerotic disease. Occlusion of the proximal left internal carotid artery. Moderate stenosis of the origin of the left external carotid artery. Reconstitution of the left internal carotid artery at the skullbase via collaterals. Diffusely diseased left cavernous carotid which is narrowed but patent. Vertebral arteries:Both vertebral arteries patent to the basilar without significant stenosis. Skeleton: No acute skeletal abnormality. Other neck: Negative for mass or adenopathy in the neck. CTA HEAD Anterior circulation: Right internal carotid artery is occluded through the cavernous segment with supraclinoid reconstitution. Right middle cerebral artery and right MCA branches patent. Right anterior cerebral artery patent. Left internal carotid artery occluded in the neck with reconstitution at the skullbase. Left cavernous carotid is narrowed but patent. Left anterior cerebral artery patent. Left M1 segment patent. Early branch to the left parietal lobe patent. Left temporal branch is occluded with irregular thrombus present. This appears acute Posterior circulation: Both vertebral arteries patent to the basilar. PICA patent bilaterally. Basilar patent. Superior cerebellar and posterior cerebral arteries patent bilaterally. Posterior communicating arteries patent bilaterally. Venous sinuses: Patent Anatomic variants: None Delayed phase: Not performed IMPRESSION: Acute thrombus left M2 segment supplying the left temporal lobe. Occlusion of the proximal left internal carotid carotid artery which is presumably acute. 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 This may be chronic. There is  reconstitution of the right internal carotid artery in the supraclinoid segment. Right anterior and middle cerebral arteries are patent bilaterally. Endotracheal tube right main bronchus.  Recommend withdrawal 3 cm. These results were called by telephone at the time of interpretation on 05/09/2015 at 1:28 pm to Dr. Audria Nine , who verbally acknowledged these results. Electronically Signed   By: Franchot Gallo M.D.   On: 05/09/2015 13:28   Ct Head Wo Contrast  05/09/2015  CLINICAL DATA:  Unresponsive/altered mental status EXAM: CT HEAD WITHOUT CONTRAST TECHNIQUE: Contiguous axial images were obtained from the base of the skull through the vertex without intravenous contrast. COMPARISON:  June 07, 2013 FINDINGS: The ventricles are normal in size and configuration. There is a focal somewhat ovoid area of decreased attenuation in the medial left frontal region, appreciable on axial slices 21, 22, 23, and 24 series 2. This area measures approximately 2 x 1 cm. It is difficult to ascertain whether this area represents a somewhat atypical infarct versus small mass. This finding was not present on prior study. There is no hemorrhage, extra-axial fluid collection, or midline shift. Elsewhere, there is minimal periventricular small vessel disease in the centra semiovale bilaterally. There is increased attenuation in the left middle cerebral artery compared to the right, concerning for potential thrombus  in this area. The bony calvarium appears intact. The mastoid air cells are clear. There is opacification of a posterior ethmoid air cell on the left. No intraorbital lesions are identified. IMPRESSION: Increased attenuation in the left middle cerebral artery compared to the right side. This finding is concerning for early acute infarct in the left middle cerebral artery distribution. Somewhat unusual appearing area of decreased attenuation in the medial left frontal lobe. This area may warrant pre and post intravenous  contrast brain MRI to assess for possible atypical mass in this area. Atypical infarct in this area is possible, but the distribution and appearance is somewhat unusual for infarct. Critical Value/emergent results were called by telephone at the time of interpretation on 05/09/2015 at 12:01 pm to Dr. Silverio Decamp, neurology, who verbally acknowledged these results. Electronically Signed   By: Lowella Grip III M.D.   On: 05/09/2015 12:02   Ct Angio Neck W/cm &/or Wo/cm  05/09/2015  CLINICAL DATA:  Stroke. EXAM: CT ANGIOGRAPHY HEAD AND NECK TECHNIQUE: Multidetector CT imaging of the head and neck was performed using the standard protocol during bolus administration of intravenous contrast. Multiplanar CT image reconstructions and MIPs were obtained to evaluate the vascular anatomy. Carotid stenosis measurements (when applicable) are obtained utilizing NASCET criteria, using the distal internal carotid diameter as the denominator. CONTRAST:  175mL OMNIPAQUE IOHEXOL 350 MG/ML SOLN COMPARISON:  CT head 05/09/2015 FINDINGS: CTA NECK Aortic arch: Mild atherosclerotic disease aortic arch. No dissection. Critical stenosis of the innominate artery. Right subclavian artery patent. Diffuse disease in the proximal right common carotid artery which occludes proximally. This may be chronic. Proximal left subclavian artery patent. Proximal left common carotid artery patent with mild atherosclerotic disease. Mild atelectasis or infiltrate in the posterior lungs bilaterally. The patient is intubated with the endotracheal tube in the right main bronchus. Recommend withdrawal 3 cm. NG tube in the esophagus. 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 Right external carotid branches reconstitute via collaterals. Right internal carotid artery is occluded through the cavernous segment. Left carotid system: Left common carotid artery patent with mild atherosclerotic disease.  Occlusion of the proximal left internal carotid artery. Moderate stenosis of the origin of the left external carotid artery. Reconstitution of the left internal carotid artery at the skullbase via collaterals. Diffusely diseased left cavernous carotid which is narrowed but patent. Vertebral arteries:Both vertebral arteries patent to the basilar without significant stenosis. Skeleton: No acute skeletal abnormality. Other neck: Negative for mass or adenopathy in the neck. CTA HEAD Anterior circulation: Right internal carotid artery is occluded through the cavernous segment with supraclinoid reconstitution. Right middle cerebral artery and right MCA branches patent. Right anterior cerebral artery patent. Left internal carotid artery occluded in the neck with reconstitution at the skullbase. Left cavernous carotid is narrowed but patent. Left anterior cerebral artery patent. Left M1 segment patent. Early branch to the left parietal lobe patent. Left temporal branch is occluded with irregular thrombus present. This appears acute Posterior circulation: Both vertebral arteries patent to the basilar. PICA patent bilaterally. Basilar patent. Superior cerebellar and posterior cerebral arteries patent bilaterally. Posterior communicating arteries patent bilaterally. Venous sinuses: Patent Anatomic variants: None Delayed phase: Not performed IMPRESSION: Acute thrombus left M2 segment supplying the left temporal lobe. Occlusion of the proximal left internal carotid carotid artery which is presumably acute. 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 This may be chronic. There is reconstitution of the right internal carotid artery  in the supraclinoid segment. Right anterior and middle cerebral arteries are patent bilaterally. Endotracheal tube right main bronchus.  Recommend withdrawal 3 cm. These results were called by telephone at the time of interpretation on 05/09/2015 at  1:28 pm to Dr. Audria Nine , who verbally acknowledged these results. Electronically Signed   By: Franchot Gallo M.D.   On: 05/09/2015 13:28   Ct Cerebral Perfusion W/cm  05/09/2015  CLINICAL DATA:  Stroke EXAM: CT CEREBRAL PERFUSION WITH CONTRAST TECHNIQUE: CT perfusion performed through the circle Willis during contrast infusion. Post processing performed to evaluate for cerebral blood flow. CONTRAST:  169mL OMNIPAQUE IOHEXOL 350 MG/ML SOLN COMPARISON:  CT head 05/09/2015 FINDINGS: CTA head and neck reported separately. Note is made of acute occlusion left M2 segment supplying the left anterior temporal lobe. Decreased cerebral blood volume in the left basal ganglia and anterior temporal lobe compatible with core infarct. This extends into the frontal lobe. There is an area of at risk penumbra in the left temporal and frontal lobe which is larger than the core infarct. This is less than 1/3 of the left MCA territory. IMPRESSION: Core infarct in the left basal ganglia and frontal temporal lobe. There is an area of penumbra which is larger than the core infarct involves the left temporal and frontal lobe. Less than 1/3 of MCA territory involved. Electronically Signed   By: Franchot Gallo M.D.   On: 05/09/2015 13:31   Portable Chest Xray  05/09/2015  CLINICAL DATA:  Respiratory failure.  CVA. EXAM: PORTABLE CHEST 1 VIEW COMPARISON:  Chest radiograph from earlier today. FINDINGS: Endotracheal tube tip is 4.3 cm above the carina. Enteric tube enters stomach with the tip not seen on this image. Stable cardiomediastinal silhouette with top-normal heart size. No pneumothorax. No pleural effusion. New hazy opacities in the right mid lung. Increased mild opacity at the left lung base. New partially visualized stent graft in the left lower neck. IMPRESSION: 1. Well-positioned support structures as described. 2. New hazy opacity in the right mid lung, probably atelectasis, cannot exclude mild pulmonary edema or  aspiration. 3. Increased left basilar opacity, probably atelectasis. Electronically Signed   By: Ilona Sorrel M.D.   On: 05/09/2015 18:11   Dg Chest Portable 1 View  05/09/2015  CLINICAL DATA:  Evaluate ETT. EXAM: PORTABLE CHEST 1 VIEW COMPARISON:  June 07, 2013 FINDINGS: The ETT terminates 1 cm above the carina. Recommend withdrawing 1.5 cm. No pneumothorax. NG tube terminates below today's film. No pulmonary nodules or masses. No focal infiltrates. Stable cardiomediastinal silhouette. Mild prominence of the interstitium could be due to low volumes or portable technique. A PA and lateral chest x-ray could better evaluate. IMPRESSION: 1. The ET tube is low lying but does not extend into either mainstem bronchus. Recommend withdrawing 1 or 1.5 cm. 2. Mild interstitial prominence may be due to technique. Recommend attention on follow-up Electronically Signed   By: Dorise Bullion III M.D   On: 05/09/2015 12:54   Dg Abd Portable 1v  05/09/2015  CLINICAL DATA:  Enteric tube placement EXAM: PORTABLE ABDOMEN - 1 VIEW COMPARISON:  01/31/2014 CT abdomen/pelvis FINDINGS: Enteric tube terminates in the gastric fundus. No dilated small bowel loops. No evidence of pneumatosis or pneumoperitoneum. No pathologic soft tissue calcifications. IMPRESSION: Enteric tube terminates in the gastric fundus. Nonobstructive bowel gas pattern. Electronically Signed   By: Ilona Sorrel M.D.   On: 05/09/2015 12:50     STUDIES:  Head Ct 3/18 >>  CT angio Head  and neck 3/18 >>   CULTURES: Resp 3/18 >>   ANTIBIOTICS: unasyn 3/18 >>   SIGNIFICANT EVENTS: S/p IR for clot retrieval 3/18  LINES/TUBES: ETT 3/18 >>  Femoral art sheath 3/18 >>  Radial art 3/18 >>   DISCUSSION: 65 yo woman, admitted with acute L temporal CVA, s/p t-PA and then IR retrieval clot L ICA  ASSESSMENT / PLAN:  PULMONARY A: Acute respiratory failure due to neuro status Possible RML aspiration PNA P:   PRVC w 8cc/kg Assess for SBT and  possible extubation after sheath removed, MRI done on 3/19 abx as below   CARDIOVASCULAR A:  Relative hypotension on sedation P:  Start phenylephrine to maintain goal SBP 120-140 TTE on 3/19  RENAL A:   No acute issues P:    GASTROINTESTINAL A:   SUP P:   PPI  HEMATOLOGIC A:   S/p t-PA P:  Bleeding precautions Follow CBC  INFECTIOUS A:   Suspected RML aspiration PNA P:   Start unasyn 3/18 Send resp cx  ENDOCRINE A:   Hyperglycemia  P:   Follow CBG; no information yet on whether she may be a diabetic  NEUROLOGIC A:   Acute L ICA CVA s/p t-PA and IR retrieval P:   RASS goal: -2 Propofol + fentanyl prn   FAMILY  - Updates:   - Inter-disciplinary family meet or Palliative Care meeting due by:  04/18/15   Independent CC time 43 minutes   Baltazar Apo, MD, PhD 05/09/2015, 6:38 PM [facility] [phone] or if no answer [phone]

## 2015-05-09 NOTE — Anesthesia Postprocedure Evaluation (Signed)
Anesthesia Post Note  Patient: Monica Pugh  Procedure(s) Performed: Procedure(s) (LRB): RADIOLOGY WITH ANESTHESIA (N/A)  Patient location during evaluation: ICU Anesthesia Type: General Level of consciousness: sedated and patient remains intubated per anesthesia plan Pain management: pain level controlled Vital Signs Assessment: post-procedure vital signs reviewed and stable Respiratory status: patient remains intubated per anesthesia plan and patient on ventilator - see flowsheet for VS Cardiovascular status: blood pressure returned to baseline and stable Anesthetic complications: no    Last Vitals:  Filed Vitals:   05/09/15 1230 05/09/15 1245  BP: 134/76 110/78  Pulse: 81 88  Temp: 35.7 C   Resp: 17 14    Last Pain: There were no vitals filed for this visit.               CREWS,DAVID A

## 2015-05-09 NOTE — ED Notes (Signed)
No history on file; family not present at this time.

## 2015-05-09 NOTE — ED Notes (Signed)
Going from CT to IR; with RR nurse Langley Gauss and IR team at this time.

## 2015-05-09 NOTE — ED Notes (Signed)
Husband returned from an errand and found her unresponsive. Last seen normal self at 1000. Arrives to Trauma A from CT at 1155.

## 2015-05-09 NOTE — Anesthesia Postprocedure Evaluation (Signed)
Anesthesia Post Note  Patient: Jacalyn Soulsby  Procedure(s) Performed: Procedure(s) (LRB): RADIOLOGY WITH ANESTHESIA (N/A)  Patient location during evaluation: ICU Anesthesia Type: General Level of consciousness: sedated, obtunded/minimal responses and patient remains intubated per anesthesia plan Vital Signs Assessment: post-procedure vital signs reviewed and stable Respiratory status: patient remains intubated per anesthesia plan and patient on ventilator - see flowsheet for VS Cardiovascular status: stable Anesthetic complications: no    Last Vitals:  Filed Vitals:   05/09/15 1230 05/09/15 1245  BP: 134/76 110/78  Pulse: 81 88  Temp: 35.7 C   Resp: 17 14    Last Pain: There were no vitals filed for this visit.               Marinda Elk

## 2015-05-10 ENCOUNTER — Inpatient Hospital Stay (HOSPITAL_COMMUNITY): Payer: Medicaid Other

## 2015-05-10 ENCOUNTER — Encounter (HOSPITAL_COMMUNITY): Payer: Self-pay | Admitting: Radiology

## 2015-05-10 DIAGNOSIS — I63512 Cerebral infarction due to unspecified occlusion or stenosis of left middle cerebral artery: Secondary | ICD-10-CM

## 2015-05-10 DIAGNOSIS — J690 Pneumonitis due to inhalation of food and vomit: Secondary | ICD-10-CM

## 2015-05-10 DIAGNOSIS — I6789 Other cerebrovascular disease: Secondary | ICD-10-CM

## 2015-05-10 LAB — GLUCOSE, CAPILLARY
Glucose-Capillary: 84 mg/dL (ref 65–99)
Glucose-Capillary: 72 mg/dL (ref 65–99)
Glucose-Capillary: 96 mg/dL (ref 65–99)
Glucose-Capillary: 83 mg/dL (ref 65–99)
Glucose-Capillary: 74 mg/dL (ref 65–99)
Glucose-Capillary: 71 mg/dL (ref 65–99)
Glucose-Capillary: 63 mg/dL — ABNORMAL LOW (ref 65–99)
Glucose-Capillary: 94 mg/dL (ref 65–99)

## 2015-05-10 LAB — RENAL FUNCTION PANEL
Albumin: 2.7 g/dL — ABNORMAL LOW (ref 3.5–5.0)
Anion gap: 8 (ref 5–15)
BUN: 6 mg/dL (ref 6–20)
CO2: 20 mmol/L — ABNORMAL LOW (ref 22–32)
Calcium: 7.4 mg/dL — ABNORMAL LOW (ref 8.9–10.3)
Chloride: 110 mmol/L (ref 101–111)
Creatinine, Ser: 0.85 mg/dL (ref 0.44–1.00)
GFR calc Af Amer: >60 mL/min (ref >60)
GFR calc non Af Amer: >60 mL/min (ref >60)
Glucose, Bld: 111 mg/dL — ABNORMAL HIGH (ref 65–99)
Phosphorus: 3.7 mg/dL (ref 2.5–4.6)
Potassium: 3.6 mmol/L (ref 3.5–5.1)
Sodium: 138 mmol/L (ref 135–145)

## 2015-05-10 LAB — CBC WITH DIFFERENTIAL/PLATELET
Basophils Absolute: 0 10*3/uL (ref 0.0–0.1)
Basophils Relative: 0 %
Eosinophils Absolute: 0.1 10*3/uL (ref 0.0–0.7)
Eosinophils Relative: 0 %
HCT: 36.3 % (ref 36.0–46.0)
Hemoglobin: 12.2 g/dL (ref 12.0–15.0)
Lymphocytes Relative: 25 %
Lymphs Abs: 3.2 10*3/uL (ref 0.7–4.0)
MCH: 32.1 pg (ref 26.0–34.0)
MCHC: 33.6 g/dL (ref 30.0–36.0)
MCV: 95.5 fL (ref 78.0–100.0)
Monocytes Absolute: 1.3 10*3/uL — ABNORMAL HIGH (ref 0.1–1.0)
Monocytes Relative: 11 %
Neutro Abs: 8.1 10*3/uL — ABNORMAL HIGH (ref 1.7–7.7)
Neutrophils Relative %: 64 %
Platelets: 217 10*3/uL (ref 150–400)
RBC: 3.8 MIL/uL — ABNORMAL LOW (ref 3.87–5.11)
RDW: 14.5 % (ref 11.5–15.5)
WBC: 12.7 10*3/uL — ABNORMAL HIGH (ref 4.0–10.5)

## 2015-05-10 LAB — LIPID PANEL
Cholesterol: 144 mg/dL (ref 0–200)
HDL: 29 mg/dL — ABNORMAL LOW (ref >40)
LDL Cholesterol: 82 mg/dL (ref 0–99)
Total CHOL/HDL Ratio: 5 RATIO
Triglycerides: 165 mg/dL — ABNORMAL HIGH (ref <150)
VLDL: 33 mg/dL (ref 0–40)

## 2015-05-10 LAB — TRIGLYCERIDES: Triglycerides: 172 mg/dL — ABNORMAL HIGH (ref <150)

## 2015-05-10 LAB — MAGNESIUM: Magnesium: 1.4 mg/dL — ABNORMAL LOW (ref 1.7–2.4)

## 2015-05-10 LAB — PLATELET INHIBITION P2Y12: Platelet Function  P2Y12: 210 [PRU] (ref 194–418)

## 2015-05-10 MED ORDER — DEXTROSE 50 % IV SOLN
INTRAVENOUS | Status: AC
  Administered 2015-05-10: 25 mL
  Filled 2015-05-10: qty 50

## 2015-05-10 MED ORDER — GADOBENATE DIMEGLUMINE 529 MG/ML IV SOLN
18.0000 mL | Freq: Once | INTRAVENOUS | Status: AC | PRN
  Administered 2015-05-10: 18 mL via INTRAVENOUS

## 2015-05-10 NOTE — Progress Notes (Signed)
PT Cancellation Note  Patient Details Name: Monica Pugh MRN: [id number] DOB: [birth year]   Cancelled Treatment:    Reason Eval/Treat Not Completed: Patient not medically ready.  Bedrest orders until pt extubated.  Will hold PT for now.  PT will continue to follow acutely.  Collie Siad PT, DPT  Pager: [phone] Phone: [phone] 05/10/2015, 8:37 AM

## 2015-05-10 NOTE — Progress Notes (Signed)
SLP Cancellation Note  Patient Details Name: Monica Pugh MRN: [id number] DOB: [birth year]   Cancelled treatment:       Reason Eval/Treat Not Completed: Medical issues which prohibited therapy.  The patient is currently intubated and unable to participate in ST evaluation.  ST will continue efforts.    Shelly Flatten, MA, [facility] SLP [phone] Lamar Sprinkles 05/10/2015, 11:41 AM

## 2015-05-10 NOTE — Progress Notes (Signed)
Spoke with Dr. Lamonte Sakai regarding patient's cuff pressure not correlating with the arterial line. Left radial arterial line responds to titration of phenylephrine, while cuff pressure remains unchanged. Was instructed to titrate the phenylephrine and propofol according to the arterial blood pressure.

## 2015-05-10 NOTE — Procedures (Signed)
Central Venous Catheter Insertion Procedure Note Elvera Smaltz [id number] November 21, 1950  Procedure: Insertion of Central Venous Catheter Indications: Drug and/or fluid administration  Procedure Details Consent: Risks of procedure as well as the alternatives and risks of each were explained to the (patient/caregiver).  Consent for procedure obtained. Time Out: Verified patient identification, verified procedure, site/side was marked, verified correct patient position, special equipment/implants available, medications/allergies/relevent history reviewed, required imaging and test results available.  Performed  Maximum sterile technique was used including antiseptics, cap, gloves, gown, hand hygiene, mask and sheet. Skin prep: Chlorhexidine; local anesthetic administered A antimicrobial bonded/coated triple lumen catheter was placed in the left femoral vein (due to poor upper extremity vasculature, soft cervical collar impeding IJ access, positive pressure ventilation and c-collar impeding subclavian access) using the Seldinger technique.  After local anesthesia, the finder needle was advanced into the vessel under ultrasound guidance. Dark red non-pulsatile blood was aspirated. The wire was threaded into the vessel and needle removed. The wire was confirmed in the vein with ultrasound. A nick was made in the skin and the tract was dilated. The catheter was advanced over the wire into the vessel. The wire was removed. All ports aspirated and flushed easily. The catheter was sutured in place and biopatch and dressing were applied. No complications. EBL <5cc.    Evaluation Blood flow good Complications: No apparent complications Patient did tolerate procedure well.  Yisroel Ramming, MD Pulmonary and Critical Care 05/10/2015 11:18 PM

## 2015-05-10 NOTE — Progress Notes (Signed)
[place] Progress Note Patient Name: Annette Plimpton DOB: 09/14/1950 MRN: [id number]   Date of Service  05/10/2015  HPI/Events of Note   Recent Labs Lab 05/09/15 1152 05/09/15 1201  HGB 15.7* 17.7*   Latest hgb is 6.3 per rN but patient not bleeding and on stable neo 46mcg  Doubt true hgb drop  eICU Interventions  Repeat cbc stat  Type and screen     Intervention Category Intermediate Interventions: Diagnostic test evaluation  RAMASWAMY,MURALI 05/10/2015, 5:54 AM

## 2015-05-10 NOTE — Progress Notes (Signed)
Spoke with Dr. Joya Gaskins via [place] regarding patient's blood pressure readings. Titrated down on propofol and had the patient begin to wean on the ventilator. Instructed again to follow the patient's arterial blood pressure reading as the cuff blood pressure readings do not fluctuate with titration of phenylephrine.

## 2015-05-10 NOTE — Progress Notes (Signed)
Referring Physician(s): Ezequiel Essex Code Stroke  Supervising Physician: Public librarian  Chief Complaint:  S/P innominate artery angiogram,Lt common carotid arteriogram,Lt Vert angiogram,followed by complete revascularization of occluded sup division Lt ICA using 10 mg of superselective intracranial tpa and x 1 pass with Solitaire 62mm x 31mm retrieval device ,and complete revascularization of symptomatic occluded LT ICA prox with stent assisted angioplasty and rescue fibrinilysis of intrastent thrombus with 20 mg Of IA Integrelin ,5mg  of IA TPA and 6 mg of IA Reopro  Subjective:  Patient is intubated and sedated.  Allergies: Review of patient's allergies indicates no known allergies.  Medications: Prior to Admission medications   Medication Sig Start Date End Date Taking? Authorizing Provider  acetaminophen (TYLENOL) 325 MG tablet Take 650 mg by mouth every 6 (six) hours as needed.   Yes Historical Provider, MD  pantoprazole (PROTONIX) 40 MG tablet Take 40 mg by mouth 2 (two) times daily. 03/24/15  Yes Historical Provider, MD     Vital Signs: BP 94/45 mmHg  Pulse 56  Temp(Src) 99.5 F (37.5 C) (Core (Comment))  Resp 14  Ht 5\' 6"  (1.676 m)  Wt 187 lb 2.7 oz (84.9 kg)  BMI 30.22 kg/m2  SpO2 100%  Physical Exam  Sedated = unable to perform neuro exam Intubated on vent Sheath remains in place. Heart RRR Lungs with rhonchi bilaterally Abdomen soft.  Imaging: Ct Angio Head W/cm &/or Wo Cm  05/09/2015  CLINICAL DATA:  Stroke. EXAM: CT ANGIOGRAPHY HEAD AND NECK TECHNIQUE: Multidetector CT imaging of the head and neck was performed using the standard protocol during bolus administration of intravenous contrast. Multiplanar CT image reconstructions and MIPs were obtained to evaluate the vascular anatomy. Carotid stenosis measurements (when applicable) are obtained utilizing NASCET criteria, using the distal internal carotid diameter as the denominator. CONTRAST:  179mL  OMNIPAQUE IOHEXOL 350 MG/ML SOLN COMPARISON:  CT head 05/09/2015 FINDINGS: CTA NECK Aortic arch: Mild atherosclerotic disease aortic arch. No dissection. Critical stenosis of the innominate artery. Right subclavian artery patent. Diffuse disease in the proximal right common carotid artery which occludes proximally. This may be chronic. Proximal left subclavian artery patent. Proximal left common carotid artery patent with mild atherosclerotic disease. Mild atelectasis or infiltrate in the posterior lungs bilaterally. The patient is intubated with the endotracheal tube in the right main bronchus. Recommend withdrawal 3 cm. NG tube in the esophagus. 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 Right external carotid branches reconstitute via collaterals. Right internal carotid artery is occluded through the cavernous segment. Left carotid system: Left common carotid artery patent with mild atherosclerotic disease. Occlusion of the proximal left internal carotid artery. Moderate stenosis of the origin of the left external carotid artery. Reconstitution of the left internal carotid artery at the skullbase via collaterals. Diffusely diseased left cavernous carotid which is narrowed but patent. Vertebral arteries:Both vertebral arteries patent to the basilar without significant stenosis. Skeleton: No acute skeletal abnormality. Other neck: Negative for mass or adenopathy in the neck. CTA HEAD Anterior circulation: Right internal carotid artery is occluded through the cavernous segment with supraclinoid reconstitution. Right middle cerebral artery and right MCA branches patent. Right anterior cerebral artery patent. Left internal carotid artery occluded in the neck with reconstitution at the skullbase. Left cavernous carotid is narrowed but patent. Left anterior cerebral artery patent. Left M1 segment patent. Early branch to the left parietal lobe patent. Left temporal  branch is occluded with irregular thrombus present. This appears acute Posterior  circulation: Both vertebral arteries patent to the basilar. PICA patent bilaterally. Basilar patent. Superior cerebellar and posterior cerebral arteries patent bilaterally. Posterior communicating arteries patent bilaterally. Venous sinuses: Patent Anatomic variants: None Delayed phase: Not performed IMPRESSION: Acute thrombus left M2 segment supplying the left temporal lobe. Occlusion of the proximal left internal carotid carotid artery which is presumably acute. 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 This may be chronic. There is reconstitution of the right internal carotid artery in the supraclinoid segment. Right anterior and middle cerebral arteries are patent bilaterally. Endotracheal tube right main bronchus.  Recommend withdrawal 3 cm. These results were called by telephone at the time of interpretation on 05/09/2015 at 1:28 pm to Dr. Audria Nine , who verbally acknowledged these results. Electronically Signed   By: Franchot Gallo M.D.   On: 05/09/2015 13:28   Ct Head Wo Contrast  05/09/2015  CLINICAL DATA:  Stroke. Post IR procedure. EXAM: CT HEAD WITHOUT CONTRAST TECHNIQUE: Contiguous axial images were obtained from the base of the skull through the vertex without intravenous contrast. COMPARISON:  Multiple prior studies FINDINGS: There is high attenuation material throughout the subarachnoid space of the left hemisphere from the level of the ventricles to the vertex. This involves the frontal lobe and high left parietal lobe primarily. A 6 mm oval focus of hyper attenuation left frontal lobe image number 21 is noted. There is increased attenuation in the falx and tentorium. No hydrocephalus. No significant intraventricular hemorrhage. Known left-sided infarct seen on CT perfusion not apparent on the current study. IMPRESSION: Contrast opacified acute left  subarachnoid bleed as described above. Critical Value/emergent results were called by telephone at the time of interpretation on 05/09/2015 at 6:25 pm to [facility], the patient's nurse, who verbally acknowledged these results.She also explained that the referring physician is aware of this finding, having viewed the scan images himself. Electronically Signed   By: Skipper Cliche M.D.   On: 05/09/2015 18:26   Ct Head Wo Contrast  05/09/2015  CLINICAL DATA:  Unresponsive/altered mental status EXAM: CT HEAD WITHOUT CONTRAST TECHNIQUE: Contiguous axial images were obtained from the base of the skull through the vertex without intravenous contrast. COMPARISON:  June 07, 2013 FINDINGS: The ventricles are normal in size and configuration. There is a focal somewhat ovoid area of decreased attenuation in the medial left frontal region, appreciable on axial slices 21, 22, 23, and 24 series 2. This area measures approximately 2 x 1 cm. It is difficult to ascertain whether this area represents a somewhat atypical infarct versus small mass. This finding was not present on prior study. There is no hemorrhage, extra-axial fluid collection, or midline shift. Elsewhere, there is minimal periventricular small vessel disease in the centra semiovale bilaterally. There is increased attenuation in the left middle cerebral artery compared to the right, concerning for potential thrombus in this area. The bony calvarium appears intact. The mastoid air cells are clear. There is opacification of a posterior ethmoid air cell on the left. No intraorbital lesions are identified. IMPRESSION: Increased attenuation in the left middle cerebral artery compared to the right side. This finding is concerning for early acute infarct in the left middle cerebral artery distribution. Somewhat unusual appearing area of decreased attenuation in the medial left frontal lobe. This area may warrant pre and post intravenous contrast brain MRI to assess for  possible atypical mass in this area. Atypical infarct in this area is possible, but the distribution and appearance is somewhat  unusual for infarct. Critical Value/emergent results were called by telephone at the time of interpretation on 05/09/2015 at 12:01 pm to Dr. Silverio Decamp, neurology, who verbally acknowledged these results. Electronically Signed   By: Lowella Grip III M.D.   On: 05/09/2015 12:02   Ct Angio Neck W/cm &/or Wo/cm  05/09/2015  CLINICAL DATA:  Stroke. EXAM: CT ANGIOGRAPHY HEAD AND NECK TECHNIQUE: Multidetector CT imaging of the head and neck was performed using the standard protocol during bolus administration of intravenous contrast. Multiplanar CT image reconstructions and MIPs were obtained to evaluate the vascular anatomy. Carotid stenosis measurements (when applicable) are obtained utilizing NASCET criteria, using the distal internal carotid diameter as the denominator. CONTRAST:  133mL OMNIPAQUE IOHEXOL 350 MG/ML SOLN COMPARISON:  CT head 05/09/2015 FINDINGS: CTA NECK Aortic arch: Mild atherosclerotic disease aortic arch. No dissection. Critical stenosis of the innominate artery. Right subclavian artery patent. Diffuse disease in the proximal right common carotid artery which occludes proximally. This may be chronic. Proximal left subclavian artery patent. Proximal left common carotid artery patent with mild atherosclerotic disease. Mild atelectasis or infiltrate in the posterior lungs bilaterally. The patient is intubated with the endotracheal tube in the right main bronchus. Recommend withdrawal 3 cm. NG tube in the esophagus. 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 Right external carotid branches reconstitute via collaterals. Right internal carotid artery is occluded through the cavernous segment. Left carotid system: Left common carotid artery patent with mild atherosclerotic disease. Occlusion of the proximal left internal  carotid artery. Moderate stenosis of the origin of the left external carotid artery. Reconstitution of the left internal carotid artery at the skullbase via collaterals. Diffusely diseased left cavernous carotid which is narrowed but patent. Vertebral arteries:Both vertebral arteries patent to the basilar without significant stenosis. Skeleton: No acute skeletal abnormality. Other neck: Negative for mass or adenopathy in the neck. CTA HEAD Anterior circulation: Right internal carotid artery is occluded through the cavernous segment with supraclinoid reconstitution. Right middle cerebral artery and right MCA branches patent. Right anterior cerebral artery patent. Left internal carotid artery occluded in the neck with reconstitution at the skullbase. Left cavernous carotid is narrowed but patent. Left anterior cerebral artery patent. Left M1 segment patent. Early branch to the left parietal lobe patent. Left temporal branch is occluded with irregular thrombus present. This appears acute Posterior circulation: Both vertebral arteries patent to the basilar. PICA patent bilaterally. Basilar patent. Superior cerebellar and posterior cerebral arteries patent bilaterally. Posterior communicating arteries patent bilaterally. Venous sinuses: Patent Anatomic variants: None Delayed phase: Not performed IMPRESSION: Acute thrombus left M2 segment supplying the left temporal lobe. Occlusion of the proximal left internal carotid carotid artery which is presumably acute. 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 This may be chronic. There is reconstitution of the right internal carotid artery in the supraclinoid segment. Right anterior and middle cerebral arteries are patent bilaterally. Endotracheal tube right main bronchus.  Recommend withdrawal 3 cm. These results were called by telephone at the time of interpretation on 05/09/2015 at 1:28 pm to Dr. Audria Nine , who  verbally acknowledged these results. Electronically Signed   By: Franchot Gallo M.D.   On: 05/09/2015 13:28   Ct Cerebral Perfusion W/cm  05/09/2015  CLINICAL DATA:  Stroke EXAM: CT CEREBRAL PERFUSION WITH CONTRAST TECHNIQUE: CT perfusion performed through the circle Willis during contrast infusion. Post processing performed to evaluate for cerebral blood flow. CONTRAST:  158mL OMNIPAQUE IOHEXOL  350 MG/ML SOLN COMPARISON:  CT head 05/09/2015 FINDINGS: CTA head and neck reported separately. Note is made of acute occlusion left M2 segment supplying the left anterior temporal lobe. Decreased cerebral blood volume in the left basal ganglia and anterior temporal lobe compatible with core infarct. This extends into the frontal lobe. There is an area of at risk penumbra in the left temporal and frontal lobe which is larger than the core infarct. This is less than 1/3 of the left MCA territory. IMPRESSION: Core infarct in the left basal ganglia and frontal temporal lobe. There is an area of penumbra which is larger than the core infarct involves the left temporal and frontal lobe. Less than 1/3 of MCA territory involved. Electronically Signed   By: Franchot Gallo M.D.   On: 05/09/2015 13:31   Dg Chest Port 1 View  05/10/2015  CLINICAL DATA:  Acute respiratory failure EXAM: PORTABLE CHEST 1 VIEW COMPARISON:  Chest radiograph from one day prior. FINDINGS: Endotracheal tube tip is 5 cm above the carina. Enteric tube enters stomach with the tip not seen on this image. Stable cardiomediastinal silhouette with top-normal heart size. No pneumothorax. Possible trace left pleural effusion. Decreased right mid lung opacity. Stable left lung base opacity. No overt pulmonary edema. IMPRESSION: 1. Endotracheal tube tip is 5 cm above the carina. 2. Possible trace left pleural effusion. 3. Decreased right mid lung opacity, likely decreased atelectasis. 4. Stable left lung base opacity, either atelectasis or pneumonia. Electronically  Signed   By: Ilona Sorrel M.D.   On: 05/10/2015 09:09   Portable Chest Xray  05/09/2015  CLINICAL DATA:  Respiratory failure.  CVA. EXAM: PORTABLE CHEST 1 VIEW COMPARISON:  Chest radiograph from earlier today. FINDINGS: Endotracheal tube tip is 4.3 cm above the carina. Enteric tube enters stomach with the tip not seen on this image. Stable cardiomediastinal silhouette with top-normal heart size. No pneumothorax. No pleural effusion. New hazy opacities in the right mid lung. Increased mild opacity at the left lung base. New partially visualized stent graft in the left lower neck. IMPRESSION: 1. Well-positioned support structures as described. 2. New hazy opacity in the right mid lung, probably atelectasis, cannot exclude mild pulmonary edema or aspiration. 3. Increased left basilar opacity, probably atelectasis. Electronically Signed   By: Ilona Sorrel M.D.   On: 05/09/2015 18:11   Dg Chest Portable 1 View  05/09/2015  CLINICAL DATA:  Evaluate ETT. EXAM: PORTABLE CHEST 1 VIEW COMPARISON:  June 07, 2013 FINDINGS: The ETT terminates 1 cm above the carina. Recommend withdrawing 1.5 cm. No pneumothorax. NG tube terminates below today's film. No pulmonary nodules or masses. No focal infiltrates. Stable cardiomediastinal silhouette. Mild prominence of the interstitium could be due to low volumes or portable technique. A PA and lateral chest x-ray could better evaluate. IMPRESSION: 1. The ET tube is low lying but does not extend into either mainstem bronchus. Recommend withdrawing 1 or 1.5 cm. 2. Mild interstitial prominence may be due to technique. Recommend attention on follow-up Electronically Signed   By: Dorise Bullion III M.D   On: 05/09/2015 12:54   Dg Abd Portable 1v  05/09/2015  CLINICAL DATA:  Orogastric tube placement. EXAM: PORTABLE ABDOMEN - 1 VIEW COMPARISON:  Earlier today. FINDINGS: Paucity of intestinal gas. Orogastric tube tip in the mid stomach. Small amount of excreted contrast in renal  collecting systems. Stable elevated left hemidiaphragm and left basilar opacity. IMPRESSION: Orogastric tube tip in the mid stomach. Electronically Signed   By: Remo Lipps  Joneen Caraway M.D.   On: 05/09/2015 18:15   Dg Abd Portable 1v  05/09/2015  CLINICAL DATA:  Enteric tube placement EXAM: PORTABLE ABDOMEN - 1 VIEW COMPARISON:  01/31/2014 CT abdomen/pelvis FINDINGS: Enteric tube terminates in the gastric fundus. No dilated small bowel loops. No evidence of pneumatosis or pneumoperitoneum. No pathologic soft tissue calcifications. IMPRESSION: Enteric tube terminates in the gastric fundus. Nonobstructive bowel gas pattern. Electronically Signed   By: Ilona Sorrel M.D.   On: 05/09/2015 12:50    Labs:  CBC:  Recent Labs  05/09/15 1152 05/09/15 1201 05/10/15 0525  WBC 9.1  --  12.7*  HGB 15.7* 17.7* 12.2  HCT 47.1* 52.0* 36.3  PLT 244  --  217    COAGS:  Recent Labs  05/09/15 1218  INR 1.08  APTT 28    BMP:  Recent Labs  05/09/15 1152 05/09/15 1201 05/10/15 0525  NA 140 140 138  K 4.3 4.2 3.6  CL 104 103 110  CO2 25  --  20*  GLUCOSE 84 79 111*  BUN 9 11 6   CALCIUM 9.3  --  7.4*  CREATININE 0.85 0.90 0.85  GFRNONAA >60  --  >60  GFRAA >60  --  >60    LIVER FUNCTION TESTS:  Recent Labs  05/09/15 1152 05/10/15 0525  BILITOT 0.8  --   AST 20  --   ALT 14  --   ALKPHOS 75  --   PROT 6.6  --   ALBUMIN 3.8 2.7*    Assessment and Plan:  S/P Cerebral angiogram with left ICA revascularization by Dr. Estanislado Pandy 05/09/2015  Sheath removal orders written  For MRI today.  Will continue to follow.  Electronically Signed: Murrell Redden PA-C 05/10/2015, 10:36 AM   I spent a total of 15 Minutes at the the patient's bedside AND on the patient's hospital floor or unit, greater than 50% of which was counseling/coordinating care for s/p cerebral angio.

## 2015-05-10 NOTE — Progress Notes (Signed)
Right 9 french sheath pull:  Bruising prior to sheath pull, soft and tender.  7 french exoseal deployed with no complications.  Held manual pressure for 25 min with a V pad.  Tegaderm, pressure dressing and sandbag applied.  Verified site with Tammy RN.    Karenann Cai RT R , [place] RT R

## 2015-05-10 NOTE — Progress Notes (Signed)
STROKE TEAM PROGRESS NOTE   HISTORY OF PRESENT ILLNESS Monica Pugh is an 65 y.o. female patient who was brought in via EMS for evaluation of altered mental status and unresponsiveness. Patient with no symptom past medical history. Does not take any medications at home except for occasional acid pills. Her has been last spoke to the patient at 10 AM, which is the time she was last seen normal. When he returned home after some shopping errands, he found her unresponsive, nonverbal and not following commands. EMS were called and brought her to the ER.   EMS Arrival date & time 05/09/15 1137 Patient arrived in the ER room after CT at 1155 IV TPA initiated at 1205   Date last known well: 05/09/2015 Time last known well: 10 am tPA Given: Yes   SUBJECTIVE (INTERVAL HISTORY) No family at bedside. Patient is intubated and sedated.   OBJECTIVE Temp:  [95.4 F (35.2 C)-100.8 F (38.2 C)] 99.1 F (37.3 C) (03/19 1330) Pulse Rate:  [32-93] 75 (03/19 1330) Cardiac Rhythm:  [-] Sinus bradycardia (03/19 1200) Resp:  [12-31] 23 (03/19 1330) BP: (65-223)/(35-182) 85/59 mmHg (03/19 1330) SpO2:  [93 %-100 %] 93 % (03/19 1330) Arterial Line BP: (110-181)/(38-67) 124/49 mmHg (03/19 1330) FiO2 (%):  [40 %-100 %] 40 % (03/19 1200) Weight:  [84.9 kg (187 lb 2.7 oz)] 84.9 kg (187 lb 2.7 oz) (03/18 1749)  CBC:  Recent Labs Lab 05/09/15 1152 05/09/15 1201 05/10/15 0525  WBC 9.1  --  12.7*  NEUTROABS 4.6  --  8.1*  HGB 15.7* 17.7* 12.2  HCT 47.1* 52.0* 36.3  MCV 94.8  --  95.5  PLT 244  --  [id number]    Basic Metabolic Panel:  Recent Labs Lab 05/09/15 1152 05/09/15 1201 05/10/15 0525  NA 140 140 138  K 4.3 4.2 3.6  CL 104 103 110  CO2 25  --  20*  GLUCOSE 84 79 111*  BUN 9 11 6   CREATININE 0.85 0.90 0.85  CALCIUM 9.3  --  7.4*  MG  --   --  1.4*  PHOS  --   --  3.7    Lipid Panel:    Component Value Date/Time   CHOL 144 05/10/2015 0525   TRIG 165* 05/10/2015 0525   TRIG 172*  05/10/2015 0525   HDL 29* 05/10/2015 0525   CHOLHDL 5.0 05/10/2015 0525   VLDL 33 05/10/2015 0525   LDLCALC 82 05/10/2015 0525   HgbA1c: No results found for: HGBA1C Urine Drug Screen:    Component Value Date/Time   LABOPIA NONE DETECTED 05/09/2015 1224   COCAINSCRNUR NONE DETECTED 05/09/2015 1224   LABBENZ NONE DETECTED 05/09/2015 1224   AMPHETMU NONE DETECTED 05/09/2015 1224   THCU NONE DETECTED 05/09/2015 1224   LABBARB NONE DETECTED 05/09/2015 1224      IMAGING  Ct Angio Head and Neck W/cm &/or Wo Cm 05/09/2015   Acute thrombus left M2 segment supplying the left temporal lobe. Occlusion of the proximal left internal carotid carotid artery which is presumably acute. 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 This may be chronic. There is reconstitution of the right internal carotid artery in the supraclinoid segment. Right anterior and middle cerebral arteries are patent bilaterally. Endotracheal tube right main bronchus.  Recommend withdrawal 3 cm.     Ct Head Wo Contrast 05/09/2015   Contrast opacified acute left subarachnoid bleed as described above.     Ct Head Wo Contrast 05/09/2015  Increased attenuation in the left middle cerebral artery compared to the right side. This finding is concerning for early acute infarct in the left middle cerebral artery distribution. Somewhat unusual appearing area of decreased attenuation in the medial left frontal lobe. This area may warrant pre and post intravenous contrast brain MRI to assess for possible atypical mass in this area. Atypical infarct in this area is possible, but the distribution and appearance is somewhat unusual for infarct.     [name] Contrast 05/10/2015   Subarachnoid hemorrhage on the left. Left frontal hematoma likely related to hemorrhagic infarction. Acute infarct in the left caudate and putamen. Scattered small areas of acute infarct involving the  left gyrus rectus and insular cortex. Scattered small areas of acute infarct in the left frontal parietal cortex and in the right frontal cortex. The right internal carotid artery remains occluded. Left internal carotid artery now appears patent through the cavernous segment.     Ct Cerebral Perfusion W/cm 05/09/2015   Core infarct in the left basal ganglia and frontal temporal lobe. There is an area of penumbra which is larger than the core infarct involves the left temporal and frontal lobe. Less than 1/3 of MCA territory involved.     Dg Chest Port 1 View 05/10/2015   1. Endotracheal tube tip is 5 cm above the carina.  2. Possible trace left pleural effusion.  3. Decreased right mid lung opacity, likely decreased atelectasis.  4. Stable left lung base opacity, either atelectasis or pneumonia.    Portable Chest Xray 05/09/2015   1. Well-positioned support structures as described.  2. New hazy opacity in the right mid lung, probably atelectasis, cannot exclude mild pulmonary edema or aspiration.  3. Increased left basilar opacity, probably atelectasis.    Dg Chest Portable 1 View 05/09/2015   1. The ET tube is low lying but does not extend into either mainstem bronchus. Recommend withdrawing 1 or 1.5 cm.  2. Mild interstitial prominence may be due to technique. Recommend attention on follow-up    Dg Abd Portable 1v 05/09/2015   Orogastric tube tip in the mid stomach.    Dg Abd Portable 1v 05/09/2015   Enteric tube terminates in the gastric fundus. Nonobstructive bowel gas pattern.     CEREBRAL ANGIOGRAM [id number] (Custom)]      Expand All Collapse All   S/P innominate artery angiogram,Lt common carotid arteriogram,Lt Vert angiogram,followed by complete revascularization of occluded sup division Lt ICA using 10 mg of superselective intracranial tpa and x 1 pass with Solitaire 19mm x 72mm retrieval device ,and complete revascularization of symptomatic occluded LT ICA prox with  stentassisted angioplasty and rescue fibrinilysis of intrastent thrombus with 20 mg Of IA Integrelin ,5mg  of IA TPA and 6 mg of IA Reopro      PHYSICAL EXAM 65 year old intubated female. Afebrile. Head is nontraumatic. Neck is supple without bruit. Cardiac exam no murmur or gallop.  Skin is warm and distally well perfused. Neurological Exam : Intubated. Does not follow exams. Patient is sedated due to agitation with right groin sheath in place. Does not open eyes to sternal rub but grimaces. Pupils equally round and reactive to light. Conjugate midline gaze. Intact cough and gag. Intact oculocephalic reflex. Difficult to assess facial symmetry due to intubation. Withdraws bilaterally in the lowers but not the uppers. Right toe is up and left toe is down.    ASSESSMENT/PLAN Monica Pugh is a 65 y.o. female with no significant past medical history  presenting with unresponsiveness and aphasia.  She received IV TPA  at 1205 on 02/08/2016 with subsequent hemorrhage.   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  MRI  Subarachnoid hemorrhage on the left. Left frontal hematoma likely related to hemorrhagic infarction.Acute infarct in the left caudate and putamen. Scattered small areas of acute infarct involving the left gyrus rectus and insular cortex. Scattered small areas of acute infarct in the left frontal parietal cortex and in the right frontal cortex. The right internal carotid artery remains occluded. Left internal carotid artery now appears patent through the cavernous segment.     MRA  not performed  Carotid Doppler refer to CTA of the neck  CTA - see above  2D Echo  pending  LDL 82  HgbA1c pending  VTE prophylaxis - SCDs  Diet NPO time specified  No antithrombotic prior to admission, now on aspirin 325 milligrams daily with Plavix 75 mg daily (NPO)  Ongoing  aggressive stroke risk factor management  Therapy recommendations:  Pending  Disposition:  Pending  Hypertension  Blood pressure running low  Permissive hypertension (OK if < 220/120) but gradually normalize in 5-7 days. Due to interventional procedure, blood pressure is recommended 0000000 systolic.   Hyperlipidemia  Home meds:   No lipid lowering medications prior to admission  LDL 82, goal < 70  Add low-dose Lipitor when taking POs  Continue statin at discharge   Other Stroke Risk Factors  Advanced age  Obesity, Body mass index is 30.22 kg/(m^2).    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  IV TPA was administered. Following this she was taken for stat CT angiogram of the head and neck with perfusion study which showed a chronic right ICA occlusion and possible acute left ICA occlusion. She was taken prior thrombectomy procedure was complete revascularization of occluded superior division left ICA. CT of the brain following showed a left medial frontal intraparenchymal hemorrhage with left frontal subarachnoid hemorrhage. She was transported to the neuro ICU. Follow-up MRI of the brain showed that the right internal carotid artery remains occluded. Left internal carotid artery now appears patent through the cavernous segment.   This patient is critically ill and at significant risk of neurological worsening, death and care requires constant monitoring of vital signs, hemodynamics,respiratory and cardiac monitoring,review of multiple databases, neurological assessment, discussion with family, other specialists and medical decision making of high complexity.I  I spent 30 minutes of neurocritical care time in the care of this patient.  Sarina Ill, MD [facility] Pager: [phone] 05/16/2014 5:09 PM     To contact Stroke Continuity provider, please refer to [email or web address] After hours, contact General Neurology

## 2015-05-10 NOTE — Progress Notes (Signed)
RT called to room due to pt vomiting. Pt placed back on full support. ETT advanced to 23 per previous placement. RT suctioned moderate amounts on thick tan secretions. Pt tolerating well at this time

## 2015-05-10 NOTE — Progress Notes (Signed)
Pt vomited a small amount on the way to MRI. Mouth was suctioned as soon as pt was transferred to the MRI table. Pt's OG tube was connected to low continuous suction while MRI was completed in order to prevent another vomiting episode. Pt continuously monitored while MRI was completed. Vital signs and neuro exam remain unchanged.

## 2015-05-10 NOTE — Progress Notes (Signed)
  Echocardiogram 2D Echocardiogram has been performed.  Bobbye Charleston 05/10/2015, 3:25 PM

## 2015-05-10 NOTE — Progress Notes (Signed)
PULMONARY / CRITICAL CARE MEDICINE   Name: Monica Pugh MRN: [id number] DOB: [birth year]    ADMISSION DATE:  05/09/2015 CONSULTATION DATE:  05/09/15  REFERRING MD:  Silverio Decamp  CHIEF COMPLAINT:  VDRF  HISTORY OF PRESENT ILLNESS:   65 yo woman, very little available currently about her hx. She was last seen normal around 10:00, was then found aphasic, unresponsive with facial droop and difficulty managing secretions by husband at 11:15. Was taken to [place] ED and then tx to [facility]. t-PA was started at 12:05. She was intubated in ED for airway protection, went to IR for angio and clot retrieval.   SUBJECTIVE:    VITAL SIGNS: BP 94/45 mmHg  Pulse 56  Temp(Src) 99.5 F (37.5 C) (Core (Comment))  Resp 14  Ht 5\' 6"  (1.676 m)  Wt 84.9 kg (187 lb 2.7 oz)  BMI 30.22 kg/m2  SpO2 100%  HEMODYNAMICS:    VENTILATOR SETTINGS: Vent Mode:  [-] PRVC FiO2 (%):  [40 %-50 %] 40 % Set Rate:  [14 bmp] 14 bmp Vt Set:  [50 mL-500 mL] 500 mL PEEP:  [5 cmH20] 5 cmH20 Plateau Pressure:  [16 cmH20-21 cmH20] 16 cmH20  INTAKE / OUTPUT: I/O last 3 completed shifts: In: 3991.4 [I.V.:3201.4; NG/GT:90; IV Piggyback:700] Out: 1750 [Urine:1750]  PHYSICAL EXAMINATION: General:  Elderly woman,  Neuro:  Sedated, grimace w stim and suctioning HEENT:  Soft C collar on, ETT in place, PERRL Cardiovascular:  Regular, no M Lungs:  Coarse B  Abdomen:  Soft, benign, + BS Musculoskeletal:  No lesions noted Skin:  Warm, no rash  LABS:  BMET  Recent Labs Lab 05/09/15 1152 05/09/15 1201 05/10/15 0525  NA 140 140 138  K 4.3 4.2 3.6  CL 104 103 110  CO2 25  --  20*  BUN 9 11 6   CREATININE 0.85 0.90 0.85  GLUCOSE 84 79 111*    Electrolytes  Recent Labs Lab 05/09/15 1152 05/10/15 0525  CALCIUM 9.3 7.4*  MG  --  1.4*  PHOS  --  3.7    CBC  Recent Labs Lab 05/09/15 1152 05/09/15 1201 05/10/15 0525  WBC 9.1  --  12.7*  HGB 15.7* 17.7* 12.2  HCT 47.1* 52.0* 36.3  PLT 244  --  217     Coag's  Recent Labs Lab 05/09/15 1218  APTT 28  INR 1.08    Sepsis Markers No results for input(s): LATICACIDVEN, PROCALCITON, O2SATVEN in the last 168 hours.  ABG  Recent Labs Lab 05/09/15 1801  PHART 7.311*  PCO2ART 40.6  PO2ART 95.0    Liver Enzymes  Recent Labs Lab 05/09/15 1152 05/10/15 0525  AST 20  --   ALT 14  --   ALKPHOS 75  --   BILITOT 0.8  --   ALBUMIN 3.8 2.7*    Cardiac Enzymes No results for input(s): TROPONINI, PROBNP in the last 168 hours.  Glucose  Recent Labs Lab 05/09/15 2314 05/09/15 2350 05/10/15 0221 05/10/15 0406 05/10/15 0417 05/10/15 0735  GLUCAP 67 95 84 72 96 83    Imaging Ct Angio Head W/cm &/or Wo Cm  05/09/2015  CLINICAL DATA:  Stroke. EXAM: CT ANGIOGRAPHY HEAD AND NECK TECHNIQUE: Multidetector CT imaging of the head and neck was performed using the standard protocol during bolus administration of intravenous contrast. Multiplanar CT image reconstructions and MIPs were obtained to evaluate the vascular anatomy. Carotid stenosis measurements (when applicable) are obtained utilizing NASCET criteria, using the distal internal carotid diameter as the denominator. CONTRAST:  132mL OMNIPAQUE IOHEXOL 350 MG/ML SOLN COMPARISON:  CT head 05/09/2015 FINDINGS: CTA NECK Aortic arch: Mild atherosclerotic disease aortic arch. No dissection. Critical stenosis of the innominate artery. Right subclavian artery patent. Diffuse disease in the proximal right common carotid artery which occludes proximally. This may be chronic. Proximal left subclavian artery patent. Proximal left common carotid artery patent with mild atherosclerotic disease. Mild atelectasis or infiltrate in the posterior lungs bilaterally. The patient is intubated with the endotracheal tube in the right main bronchus. Recommend withdrawal 3 cm. NG tube in the esophagus. 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 Right  external carotid branches reconstitute via collaterals. Right internal carotid artery is occluded through the cavernous segment. Left carotid system: Left common carotid artery patent with mild atherosclerotic disease. Occlusion of the proximal left internal carotid artery. Moderate stenosis of the origin of the left external carotid artery. Reconstitution of the left internal carotid artery at the skullbase via collaterals. Diffusely diseased left cavernous carotid which is narrowed but patent. Vertebral arteries:Both vertebral arteries patent to the basilar without significant stenosis. Skeleton: No acute skeletal abnormality. Other neck: Negative for mass or adenopathy in the neck. CTA HEAD Anterior circulation: Right internal carotid artery is occluded through the cavernous segment with supraclinoid reconstitution. Right middle cerebral artery and right MCA branches patent. Right anterior cerebral artery patent. Left internal carotid artery occluded in the neck with reconstitution at the skullbase. Left cavernous carotid is narrowed but patent. Left anterior cerebral artery patent. Left M1 segment patent. Early branch to the left parietal lobe patent. Left temporal branch is occluded with irregular thrombus present. This appears acute Posterior circulation: Both vertebral arteries patent to the basilar. PICA patent bilaterally. Basilar patent. Superior cerebellar and posterior cerebral arteries patent bilaterally. Posterior communicating arteries patent bilaterally. Venous sinuses: Patent Anatomic variants: None Delayed phase: Not performed IMPRESSION: Acute thrombus left M2 segment supplying the left temporal lobe. Occlusion of the proximal left internal carotid carotid artery which is presumably acute. 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 This may be chronic. There is reconstitution of the right internal carotid artery in the supraclinoid segment.  Right anterior and middle cerebral arteries are patent bilaterally. Endotracheal tube right main bronchus.  Recommend withdrawal 3 cm. These results were called by telephone at the time of interpretation on 05/09/2015 at 1:28 pm to Dr. Audria Nine , who verbally acknowledged these results. Electronically Signed   By: Franchot Gallo M.D.   On: 05/09/2015 13:28   Ct Head Wo Contrast  05/09/2015  CLINICAL DATA:  Stroke. Post IR procedure. EXAM: CT HEAD WITHOUT CONTRAST TECHNIQUE: Contiguous axial images were obtained from the base of the skull through the vertex without intravenous contrast. COMPARISON:  Multiple prior studies FINDINGS: There is high attenuation material throughout the subarachnoid space of the left hemisphere from the level of the ventricles to the vertex. This involves the frontal lobe and high left parietal lobe primarily. A 6 mm oval focus of hyper attenuation left frontal lobe image number 21 is noted. There is increased attenuation in the falx and tentorium. No hydrocephalus. No significant intraventricular hemorrhage. Known left-sided infarct seen on CT perfusion not apparent on the current study. IMPRESSION: Contrast opacified acute left subarachnoid bleed as described above. Critical Value/emergent results were called by telephone at the time of interpretation on 05/09/2015 at 6:25 pm to [facility], the patient's nurse, who verbally acknowledged these results.She also explained that the referring  physician is aware of this finding, having viewed the scan images himself. Electronically Signed   By: Skipper Cliche M.D.   On: 05/09/2015 18:26   Ct Head Wo Contrast  05/09/2015  CLINICAL DATA:  Unresponsive/altered mental status EXAM: CT HEAD WITHOUT CONTRAST TECHNIQUE: Contiguous axial images were obtained from the base of the skull through the vertex without intravenous contrast. COMPARISON:  June 07, 2013 FINDINGS: The ventricles are normal in size and configuration. There is a focal somewhat  ovoid area of decreased attenuation in the medial left frontal region, appreciable on axial slices 21, 22, 23, and 24 series 2. This area measures approximately 2 x 1 cm. It is difficult to ascertain whether this area represents a somewhat atypical infarct versus small mass. This finding was not present on prior study. There is no hemorrhage, extra-axial fluid collection, or midline shift. Elsewhere, there is minimal periventricular small vessel disease in the centra semiovale bilaterally. There is increased attenuation in the left middle cerebral artery compared to the right, concerning for potential thrombus in this area. The bony calvarium appears intact. The mastoid air cells are clear. There is opacification of a posterior ethmoid air cell on the left. No intraorbital lesions are identified. IMPRESSION: Increased attenuation in the left middle cerebral artery compared to the right side. This finding is concerning for early acute infarct in the left middle cerebral artery distribution. Somewhat unusual appearing area of decreased attenuation in the medial left frontal lobe. This area may warrant pre and post intravenous contrast brain MRI to assess for possible atypical mass in this area. Atypical infarct in this area is possible, but the distribution and appearance is somewhat unusual for infarct. Critical Value/emergent results were called by telephone at the time of interpretation on 05/09/2015 at 12:01 pm to Dr. Silverio Decamp, neurology, who verbally acknowledged these results. Electronically Signed   By: Lowella Grip III M.D.   On: 05/09/2015 12:02   Ct Angio Neck W/cm &/or Wo/cm  05/09/2015  CLINICAL DATA:  Stroke. EXAM: CT ANGIOGRAPHY HEAD AND NECK TECHNIQUE: Multidetector CT imaging of the head and neck was performed using the standard protocol during bolus administration of intravenous contrast. Multiplanar CT image reconstructions and MIPs were obtained to evaluate the vascular anatomy. Carotid  stenosis measurements (when applicable) are obtained utilizing NASCET criteria, using the distal internal carotid diameter as the denominator. CONTRAST:  162mL OMNIPAQUE IOHEXOL 350 MG/ML SOLN COMPARISON:  CT head 05/09/2015 FINDINGS: CTA NECK Aortic arch: Mild atherosclerotic disease aortic arch. No dissection. Critical stenosis of the innominate artery. Right subclavian artery patent. Diffuse disease in the proximal right common carotid artery which occludes proximally. This may be chronic. Proximal left subclavian artery patent. Proximal left common carotid artery patent with mild atherosclerotic disease. Mild atelectasis or infiltrate in the posterior lungs bilaterally. The patient is intubated with the endotracheal tube in the right main bronchus. Recommend withdrawal 3 cm. NG tube in the esophagus. 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 Right external carotid branches reconstitute via collaterals. Right internal carotid artery is occluded through the cavernous segment. Left carotid system: Left common carotid artery patent with mild atherosclerotic disease. Occlusion of the proximal left internal carotid artery. Moderate stenosis of the origin of the left external carotid artery. Reconstitution of the left internal carotid artery at the skullbase via collaterals. Diffusely diseased left cavernous carotid which is narrowed but patent. Vertebral arteries:Both vertebral arteries patent to the basilar without significant stenosis. Skeleton: No acute skeletal  abnormality. Other neck: Negative for mass or adenopathy in the neck. CTA HEAD Anterior circulation: Right internal carotid artery is occluded through the cavernous segment with supraclinoid reconstitution. Right middle cerebral artery and right MCA branches patent. Right anterior cerebral artery patent. Left internal carotid artery occluded in the neck with reconstitution at the skullbase. Left  cavernous carotid is narrowed but patent. Left anterior cerebral artery patent. Left M1 segment patent. Early branch to the left parietal lobe patent. Left temporal branch is occluded with irregular thrombus present. This appears acute Posterior circulation: Both vertebral arteries patent to the basilar. PICA patent bilaterally. Basilar patent. Superior cerebellar and posterior cerebral arteries patent bilaterally. Posterior communicating arteries patent bilaterally. Venous sinuses: Patent Anatomic variants: None Delayed phase: Not performed IMPRESSION: Acute thrombus left M2 segment supplying the left temporal lobe. Occlusion of the proximal left internal carotid carotid artery which is presumably acute. 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 This may be chronic. There is reconstitution of the right internal carotid artery in the supraclinoid segment. Right anterior and middle cerebral arteries are patent bilaterally. Endotracheal tube right main bronchus.  Recommend withdrawal 3 cm. These results were called by telephone at the time of interpretation on 05/09/2015 at 1:28 pm to Dr. Audria Nine , who verbally acknowledged these results. Electronically Signed   By: Franchot Gallo M.D.   On: 05/09/2015 13:28   Ct Cerebral Perfusion W/cm  05/09/2015  CLINICAL DATA:  Stroke EXAM: CT CEREBRAL PERFUSION WITH CONTRAST TECHNIQUE: CT perfusion performed through the circle Willis during contrast infusion. Post processing performed to evaluate for cerebral blood flow. CONTRAST:  150mL OMNIPAQUE IOHEXOL 350 MG/ML SOLN COMPARISON:  CT head 05/09/2015 FINDINGS: CTA head and neck reported separately. Note is made of acute occlusion left M2 segment supplying the left anterior temporal lobe. Decreased cerebral blood volume in the left basal ganglia and anterior temporal lobe compatible with core infarct. This extends into the frontal lobe. There is an area of at risk  penumbra in the left temporal and frontal lobe which is larger than the core infarct. This is less than 1/3 of the left MCA territory. IMPRESSION: Core infarct in the left basal ganglia and frontal temporal lobe. There is an area of penumbra which is larger than the core infarct involves the left temporal and frontal lobe. Less than 1/3 of MCA territory involved. Electronically Signed   By: Franchot Gallo M.D.   On: 05/09/2015 13:31   Dg Chest Port 1 View  05/10/2015  CLINICAL DATA:  Acute respiratory failure EXAM: PORTABLE CHEST 1 VIEW COMPARISON:  Chest radiograph from one day prior. FINDINGS: Endotracheal tube tip is 5 cm above the carina. Enteric tube enters stomach with the tip not seen on this image. Stable cardiomediastinal silhouette with top-normal heart size. No pneumothorax. Possible trace left pleural effusion. Decreased right mid lung opacity. Stable left lung base opacity. No overt pulmonary edema. IMPRESSION: 1. Endotracheal tube tip is 5 cm above the carina. 2. Possible trace left pleural effusion. 3. Decreased right mid lung opacity, likely decreased atelectasis. 4. Stable left lung base opacity, either atelectasis or pneumonia. Electronically Signed   By: Ilona Sorrel M.D.   On: 05/10/2015 09:09   Portable Chest Xray  05/09/2015  CLINICAL DATA:  Respiratory failure.  CVA. EXAM: PORTABLE CHEST 1 VIEW COMPARISON:  Chest radiograph from earlier today. FINDINGS: Endotracheal tube tip is 4.3 cm above the carina. Enteric tube enters stomach with the tip not seen  on this image. Stable cardiomediastinal silhouette with top-normal heart size. No pneumothorax. No pleural effusion. New hazy opacities in the right mid lung. Increased mild opacity at the left lung base. New partially visualized stent graft in the left lower neck. IMPRESSION: 1. Well-positioned support structures as described. 2. New hazy opacity in the right mid lung, probably atelectasis, cannot exclude mild pulmonary edema or  aspiration. 3. Increased left basilar opacity, probably atelectasis. Electronically Signed   By: Ilona Sorrel M.D.   On: 05/09/2015 18:11   Dg Chest Portable 1 View  05/09/2015  CLINICAL DATA:  Evaluate ETT. EXAM: PORTABLE CHEST 1 VIEW COMPARISON:  June 07, 2013 FINDINGS: The ETT terminates 1 cm above the carina. Recommend withdrawing 1.5 cm. No pneumothorax. NG tube terminates below today's film. No pulmonary nodules or masses. No focal infiltrates. Stable cardiomediastinal silhouette. Mild prominence of the interstitium could be due to low volumes or portable technique. A PA and lateral chest x-ray could better evaluate. IMPRESSION: 1. The ET tube is low lying but does not extend into either mainstem bronchus. Recommend withdrawing 1 or 1.5 cm. 2. Mild interstitial prominence may be due to technique. Recommend attention on follow-up Electronically Signed   By: Dorise Bullion III M.D   On: 05/09/2015 12:54   Dg Abd Portable 1v  05/09/2015  CLINICAL DATA:  Orogastric tube placement. EXAM: PORTABLE ABDOMEN - 1 VIEW COMPARISON:  Earlier today. FINDINGS: Paucity of intestinal gas. Orogastric tube tip in the mid stomach. Small amount of excreted contrast in renal collecting systems. Stable elevated left hemidiaphragm and left basilar opacity. IMPRESSION: Orogastric tube tip in the mid stomach. Electronically Signed   By: Claudie Revering M.D.   On: 05/09/2015 18:15   Dg Abd Portable 1v  05/09/2015  CLINICAL DATA:  Enteric tube placement EXAM: PORTABLE ABDOMEN - 1 VIEW COMPARISON:  01/31/2014 CT abdomen/pelvis FINDINGS: Enteric tube terminates in the gastric fundus. No dilated small bowel loops. No evidence of pneumatosis or pneumoperitoneum. No pathologic soft tissue calcifications. IMPRESSION: Enteric tube terminates in the gastric fundus. Nonobstructive bowel gas pattern. Electronically Signed   By: Ilona Sorrel M.D.   On: 05/09/2015 12:50     STUDIES:  Head Ct 3/18 >> probable early L MCA infarct CT  angio Head and neck 3/18 >> acute thrombus L M2 segment, occlusion L ICA, prox stenosis innominate, chronic occlusion R ICA with collaterals Brain MRI 3/19 >>   CULTURES: Resp 3/18 >>   ANTIBIOTICS: unasyn 3/18 >>   SIGNIFICANT EVENTS: S/p IR for clot retrieval 3/18  LINES/TUBES: ETT 3/18 >>  Femoral art sheath 3/18 >>  Radial art 3/18 >>   DISCUSSION: 65 yo woman, admitted with acute L temporal CVA, s/p t-PA and then IR retrieval clot L ICA  ASSESSMENT / PLAN:  PULMONARY A: Acute respiratory failure due to neuro status Possible RML aspiration PNA P:   PRVC w 8cc/kg Assess for SBT and possible extubation after sheath removed, MRI done on 3/19 abx as below   CARDIOVASCULAR A:  Relative hypotension on sedation P:  Continue phenylephrine to maintain goal SBP 120-140. Anticipate this will wean to off once propofol lifted TTE on 3/19  RENAL A:   Non-AG acidosis P:   Follow BMP  GASTROINTESTINAL A:   SUP P:   PPI  HEMATOLOGIC A:   S/p t-PA P:  Bleeding precautions Follow CBC  INFECTIOUS A:   Suspected RML aspiration PNA P:   Unasyn 3/18 Follow resp cx  ENDOCRINE A:  Hyperglycemia  P:   Follow CBG  NEUROLOGIC A:   Acute L ICA CVA s/p t-PA and IR retrieval P:   RASS goal: -2 Propofol + fentanyl prn MRI brain planned for 3/19 Sheath out 3/19   FAMILY  - Updates:   - Inter-disciplinary family meet or Palliative Care meeting due by:  04/18/15   Independent CC time 35 minutes   Baltazar Apo, MD, PhD 05/10/2015, 9:46 AM [facility] [phone] or if no answer [phone]

## 2015-05-11 ENCOUNTER — Encounter (HOSPITAL_COMMUNITY): Payer: Self-pay | Admitting: Interventional Radiology

## 2015-05-11 ENCOUNTER — Inpatient Hospital Stay (HOSPITAL_COMMUNITY): Payer: Medicaid Other

## 2015-05-11 DIAGNOSIS — G936 Cerebral edema: Secondary | ICD-10-CM

## 2015-05-11 DIAGNOSIS — I610 Nontraumatic intracerebral hemorrhage in hemisphere, subcortical: Secondary | ICD-10-CM

## 2015-05-11 DIAGNOSIS — J9620 Acute and chronic respiratory failure, unspecified whether with hypoxia or hypercapnia: Secondary | ICD-10-CM

## 2015-05-11 LAB — GLUCOSE, CAPILLARY
Glucose-Capillary: 141 mg/dL — ABNORMAL HIGH (ref 65–99)
Glucose-Capillary: 148 mg/dL — ABNORMAL HIGH (ref 65–99)
Glucose-Capillary: 54 mg/dL — ABNORMAL LOW (ref 65–99)
Glucose-Capillary: 58 mg/dL — ABNORMAL LOW (ref 65–99)
Glucose-Capillary: 62 mg/dL — ABNORMAL LOW (ref 65–99)
Glucose-Capillary: 76 mg/dL (ref 65–99)
Glucose-Capillary: 81 mg/dL (ref 65–99)
Glucose-Capillary: 83 mg/dL (ref 65–99)
Glucose-Capillary: 90 mg/dL (ref 65–99)

## 2015-05-11 LAB — HEMOGLOBIN A1C
Hgb A1c MFr Bld: 5.7 % — ABNORMAL HIGH (ref 4.8–5.6)
Mean Plasma Glucose: 117 mg/dL

## 2015-05-11 LAB — TRIGLYCERIDES: Triglycerides: 149 mg/dL (ref <150)

## 2015-05-11 LAB — POCT ACTIVATED CLOTTING TIME: Activated Clotting Time: 172 seconds

## 2015-05-11 LAB — ECHOCARDIOGRAM COMPLETE
Height: 66 in
Weight: 2994.73 oz

## 2015-05-11 MED ORDER — DEXTROSE 50 % IV SOLN
1.0000 | Freq: Once | INTRAVENOUS | Status: AC
  Administered 2015-05-11: 50 mL via INTRAVENOUS

## 2015-05-11 MED ORDER — DEXTROSE 50 % IV SOLN
50.0000 mL | Freq: Once | INTRAVENOUS | Status: AC
  Administered 2015-05-11: 50 mL via INTRAVENOUS

## 2015-05-11 MED ORDER — DEXTROSE 50 % IV SOLN
INTRAVENOUS | Status: AC
  Filled 2015-05-11: qty 50

## 2015-05-11 MED ORDER — ANTISEPTIC ORAL RINSE SOLUTION (CORINZ)
7.0000 mL | Freq: Four times a day (QID) | OROMUCOSAL | Status: DC
  Administered 2015-05-11 – 2015-05-13 (×10): 7 mL via OROMUCOSAL

## 2015-05-11 NOTE — Clinical Social Work Note (Signed)
Clinical Social Worker received referral for health insurance/financial concerns, however patient currently remains on the ventilator and not following commands.  CSW will notify financial counseling of potential concerns.  Clinical Social Worker will sign off for now as social work intervention is no longer needed. Please consult [place] again if new need arises.  Barbette Or, [place]

## 2015-05-11 NOTE — Progress Notes (Signed)
STROKE TEAM PROGRESS NOTE   HISTORY OF PRESENT ILLNESS Monica Pugh is an 65 y.o. female patient who was brought in via EMS for evaluation of altered mental status and unresponsiveness. Patient with no symptom past medical history. Does not take any medications at home except for occasional acid pills. Her has been last spoke to the patient at 10 AM, which is the time she was last seen normal. When he returned home after some shopping errands, he found her unresponsive, nonverbal and not following commands. EMS were called and brought her to the ER.   EMS Arrival date & time 05/09/15 1137 Patient arrived in the ER room after CT at 1155 IV TPA initiated at 1205   Date last known well: 05/09/2015 Time last known well: 10 am tPA Given: Yes   SUBJECTIVE (INTERVAL HISTORY)  family  Is at bedside. Patient is intubated and sedated And plan for extubation shortly.   OBJECTIVE Temp:  [98.8 F (37.1 C)-100.6 F (38.1 C)] 99.3 F (37.4 C) (03/20 1600) Pulse Rate:  [54-89] 79 (03/20 1600) Cardiac Rhythm:  [-] Normal sinus rhythm;Sinus bradycardia (03/20 0800) Resp:  [14-29] 24 (03/20 1600) BP: (71-106)/(38-82) 84/43 mmHg (03/20 1600) SpO2:  [86 %-100 %] 94 % (03/20 1600) Arterial Line BP: (99-172)/(35-52) 168/41 mmHg (03/20 1600) FiO2 (%):  [40 %] 40 % (03/20 0825)  CBC:   Recent Labs Lab 05/09/15 1152 05/09/15 1201 05/10/15 0525  WBC 9.1  --  12.7*  NEUTROABS 4.6  --  8.1*  HGB 15.7* 17.7* 12.2  HCT 47.1* 52.0* 36.3  MCV 94.8  --  95.5  PLT 244  --  [id number]    Basic Metabolic Panel:   Recent Labs Lab 05/09/15 1152 05/09/15 1201 05/10/15 0525  NA 140 140 138  K 4.3 4.2 3.6  CL 104 103 110  CO2 25  --  20*  GLUCOSE 84 79 111*  BUN 9 11 6   CREATININE 0.85 0.90 0.85  CALCIUM 9.3  --  7.4*  MG  --   --  1.4*  PHOS  --   --  3.7    Lipid Panel:     Component Value Date/Time   CHOL 144 05/10/2015 0525   TRIG 149 05/11/2015 0635   HDL 29* 05/10/2015 0525   CHOLHDL 5.0  05/10/2015 0525   VLDL 33 05/10/2015 0525   LDLCALC 82 05/10/2015 0525   HgbA1c:  Lab Results  Component Value Date   HGBA1C 5.7* 05/10/2015   Urine Drug Screen:     Component Value Date/Time   LABOPIA NONE DETECTED 05/09/2015 1224   COCAINSCRNUR NONE DETECTED 05/09/2015 1224   LABBENZ NONE DETECTED 05/09/2015 1224   AMPHETMU NONE DETECTED 05/09/2015 1224   THCU NONE DETECTED 05/09/2015 1224   LABBARB NONE DETECTED 05/09/2015 1224      IMAGING  Ct Angio Head and Neck W/cm &/or Wo Cm 05/09/2015   Acute thrombus left M2 segment supplying the left temporal lobe. Occlusion of the proximal left internal carotid carotid artery which is presumably acute. 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 This may be chronic. There is reconstitution of the right internal carotid artery in the supraclinoid segment. Right anterior and middle cerebral arteries are patent bilaterally. Endotracheal tube right main bronchus.  Recommend withdrawal 3 cm.     Ct Head Wo Contrast 05/09/2015   Contrast opacified acute left subarachnoid bleed as described above.     Ct Head Wo Contrast 05/09/2015  Increased attenuation in the left middle cerebral artery compared to the right side. This finding is concerning for early acute infarct in the left middle cerebral artery distribution. Somewhat unusual appearing area of decreased attenuation in the medial left frontal lobe. This area may warrant pre and post intravenous contrast brain MRI to assess for possible atypical mass in this area. Atypical infarct in this area is possible, but the distribution and appearance is somewhat unusual for infarct.     [name] Contrast 05/10/2015   Subarachnoid hemorrhage on the left. Left frontal hematoma likely related to hemorrhagic infarction. Acute infarct in the left caudate and putamen. Scattered small areas of acute infarct involving the left gyrus rectus and  insular cortex. Scattered small areas of acute infarct in the left frontal parietal cortex and in the right frontal cortex. The right internal carotid artery remains occluded. Left internal carotid artery now appears patent through the cavernous segment.     Ct Cerebral Perfusion W/cm 05/09/2015   Core infarct in the left basal ganglia and frontal temporal lobe. There is an area of penumbra which is larger than the core infarct involves the left temporal and frontal lobe. Less than 1/3 of MCA territory involved.     Dg Chest Port 1 View 05/10/2015   1. Endotracheal tube tip is 5 cm above the carina.  2. Possible trace left pleural effusion.  3. Decreased right mid lung opacity, likely decreased atelectasis.  4. Stable left lung base opacity, either atelectasis or pneumonia.    Portable Chest Xray 05/09/2015   1. Well-positioned support structures as described.  2. New hazy opacity in the right mid lung, probably atelectasis, cannot exclude mild pulmonary edema or aspiration.  3. Increased left basilar opacity, probably atelectasis.    Dg Chest Portable 1 View 05/09/2015   1. The ET tube is low lying but does not extend into either mainstem bronchus. Recommend withdrawing 1 or 1.5 cm.  2. Mild interstitial prominence may be due to technique. Recommend attention on follow-up    Dg Abd Portable 1v 05/09/2015   Orogastric tube tip in the mid stomach.    Dg Abd Portable 1v 05/09/2015   Enteric tube terminates in the gastric fundus. Nonobstructive bowel gas pattern.     CEREBRAL ANGIOGRAM [id number] (Custom)]      Expand All Collapse All   S/P innominate artery angiogram,Lt common carotid arteriogram,Lt Vert angiogram,followed by complete revascularization of occluded sup division Lt ICA using 10 mg of superselective intracranial tpa and x 1 pass with Solitaire 42mm x 104mm retrieval device ,and complete revascularization of symptomatic occluded LT ICA prox with stentassisted  angioplasty and rescue fibrinilysis of intrastent thrombus with 20 mg Of IA Integrelin ,5mg  of IA TPA and 6 mg of IA Reopro      PHYSICAL EXAM 65 year old intubated female. Afebrile. Head is nontraumatic. Neck is supple without bruit. Cardiac exam no murmur or gallop.  Skin is warm and distally well perfused. Neurological Exam : Intubated. Does   follow commands awake interactive. Pupils equally round and reactive to light. Conjugate midline gaze. Intact cough and gag. Intact oculocephalic reflex. Difficult to assess facial symmetry due to intubation. Withdraws bilaterally in the lowers but not the uppers left more than right side. Right toe is up and left toe is down.    ASSESSMENT/PLAN Ms. Monica Pugh is a 65 y.o. female with no significant past medical history presenting with unresponsiveness and aphasia.  She received IV TPA  at [facility]  on 02/08/2016 with subsequent hemorrhage.   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  MRI  Subarachnoid hemorrhage on the left. Left frontal hematoma likely related to hemorrhagic infarction versus post TPA hemorrhage.Acute infarct in the left caudate and putamen. Scattered small areas of acute infarct involving the left gyrus rectus and insular cortex. Scattered small areas of acute infarct in the left frontal parietal cortex and in the right frontal cortex. The right internal carotid artery remains occluded. Left internal carotid artery now appears patent through the cavernous segment.     MRA  not performed  Carotid Doppler refer to CTA of the neck  CTA - see above 2D Echo   Left ventricle: The cavity size was normal. Wall thickness was  increased in a pattern of mild LVH. Systolic function was normal.  The estimated ejection fraction was in the range of 55% to 60%.  Wall motion was normal; there were no  regional wall motion   abnormalities  LDL 82  HgbA1c pending  VTE prophylaxis - SCDs Diet NPO time specified  No antithrombotic prior to admission, now on aspirin 325 milligrams daily with Plavix 75 mg daily (NPO)  Ongoing aggressive stroke risk factor management  Therapy recommendations:  Pending  Disposition:  Pending  Hypertension  Blood pressure running low  Permissive hypertension (OK if < 220/120) but gradually normalize in 5-7 days. Due to interventional procedure, blood pressure is recommended 0000000 systolic.   Hyperlipidemia  Home meds:   No lipid lowering medications prior to admission  LDL 82, goal < 70  Add low-dose Lipitor when taking POs  Continue statin at discharge   Other Stroke Risk Factors  Advanced age  Obesity, Body mass index is 30.22 kg/(m^2).    Other Active Problems  Mild leukocytosis  VDRF  Hyportension  Suspected aspiration pneumonia   Hospital day # 2  This is a 65 year old female with a chronically occluded right carotid artery who was found aphasic and unresponsive.  IV TPA was administered. Following this she was taken for stat CT angiogram of the head and neck with perfusion study which showed a chronic right ICA occlusion and possible acute left ICA occlusion. She was taken prior thrombectomy procedure was complete revascularization of occluded superior division left ICA. CT of the brain following showed a left medial frontal intraparenchymal hemorrhage with left frontal subarachnoid hemorrhage. She was transported to the neuro ICU. Follow-up MRI of the brain showed that the right internal carotid artery remains occluded. Left internal carotid artery now appears patent through the cavernous segment.   This patient is critically ill and at significant risk of neurological worsening, death and care requires constant monitoring of vital signs, hemodynamics,respiratory and cardiac monitoring,review of multiple databases,  neurological assessment, discussion with family, other specialists and medical decision making of high complexity.I I spent 35 minutes of neurocritical care time in the care of this patient. Plan to extubate the patient today. Check swallow eval after extubation. Physical occupational therapy consults. I had a long discussion with the patient's husband and son at the bedside and answered questions about her prognosis, stroke evaluation and answered questions. Antony Contras, MD [facility] Pager: [phone] 05/16/2014 5:09 PM     To contact Stroke Continuity provider, please refer to [email or web address] After hours, contact General Neurology

## 2015-05-11 NOTE — Progress Notes (Signed)
PULMONARY / CRITICAL CARE MEDICINE   Name: Monica Pugh MRN: [id number] DOB: [birth year]    ADMISSION DATE:  05/09/2015 CONSULTATION DATE:  05/09/15  REFERRING MD:  Silverio Decamp  CHIEF COMPLAINT:  VDRF  HISTORY OF PRESENT ILLNESS:   65 yo woman, very little available currently about her hx. She was last seen normal around 10:00, was then found aphasic, unresponsive with facial droop and difficulty managing secretions by husband at 11:15. Was taken to [place] ED and then tx to [facility]. t-PA was started at 12:05. She was intubated in ED for airway protection, went to IR for angio and clot retrieval.   SUBJECTIVE:    VITAL SIGNS: BP 89/40 mmHg  Pulse 65  Temp(Src) 99.7 F (37.6 C) (Core (Comment))  Resp 21  Ht 5\' 6"  (1.676 m)  Wt 187 lb 2.7 oz (84.9 kg)  BMI 30.22 kg/m2  SpO2 98%  HEMODYNAMICS:    VENTILATOR SETTINGS: Vent Mode:  [-] PSV;CPAP FiO2 (%):  [40 %-100 %] 40 % Set Rate:  [14 bmp] 14 bmp Vt Set:  [500 mL] 500 mL PEEP:  [5 cmH20] 5 cmH20 Pressure Support:  [5 cmH20-10 cmH20] 5 cmH20 Plateau Pressure:  [17 cmH20] 17 cmH20  INTAKE / OUTPUT: I/O last 3 completed shifts: In: 5016.4 [I.V.:4396.4; NG/GT:220; IV Piggyback:400] Out: 1905 [Urine:1905]  PHYSICAL EXAMINATION: General:  Elderly woman,  Neuro:  Sedated, grimace w stim and suctioning HEENT:  Soft C collar on, ETT in place, PERRL Cardiovascular:  Regular, no M Lungs:  Coarse B  Abdomen:  Soft, benign, + BS Musculoskeletal:  No lesions noted Skin:  Warm, no rash  LABS:  BMET  Recent Labs Lab 05/09/15 1152 05/09/15 1201 05/10/15 0525  NA 140 140 138  K 4.3 4.2 3.6  CL 104 103 110  CO2 25  --  20*  BUN 9 11 6   CREATININE 0.85 0.90 0.85  GLUCOSE 84 79 111*    Electrolytes  Recent Labs Lab 05/09/15 1152 05/10/15 0525  CALCIUM 9.3 7.4*  MG  --  1.4*  PHOS  --  3.7    CBC  Recent Labs Lab 05/09/15 1152 05/09/15 1201 05/10/15 0525  WBC 9.1  --  12.7*  HGB 15.7* 17.7* 12.2  HCT 47.1*  52.0* 36.3  PLT 244  --  217    Coag's  Recent Labs Lab 05/09/15 1218  APTT 28  INR 1.08    Sepsis Markers No results for input(s): LATICACIDVEN, PROCALCITON, O2SATVEN in the last 168 hours.  ABG  Recent Labs Lab 05/09/15 1801  PHART 7.311*  PCO2ART 40.6  PO2ART 95.0    Liver Enzymes  Recent Labs Lab 05/09/15 1152 05/10/15 0525  AST 20  --   ALT 14  --   ALKPHOS 75  --   BILITOT 0.8  --   ALBUMIN 3.8 2.7*    Cardiac Enzymes No results for input(s): TROPONINI, PROBNP in the last 168 hours.  Glucose  Recent Labs Lab 05/10/15 1959 05/10/15 2030 05/10/15 2339 05/11/15 0405 05/11/15 0439 05/11/15 0818  GLUCAP 63* 94 90 58* 81 83    Imaging Mr Brain W Wo Contrast  05/10/2015  CLINICAL DATA:  Acute ischemic stroke post revascularization. EXAM: MRI HEAD WITHOUT AND WITH CONTRAST TECHNIQUE: Multiplanar, multiecho pulse sequences of the brain and surrounding structures were obtained without and with intravenous contrast. CONTRAST:  48mL MULTIHANCE GADOBENATE DIMEGLUMINE 529 MG/ML IV SOLN COMPARISON:  CT head 05/09/2015 FINDINGS: Subarachnoid hemorrhage over the left convexity as noted on CT. Left  frontal parenchymal hematoma measuring 30 x 17 mm in the anterior cerebral artery territory. This also is noted on CT and similar in size. Acute infarct involving the left caudate and left putamen. Very small areas of acute infarct involving the insular cortex on the left as well as the left frontal and parietal lobes. 5 mm acute infarct right frontal cortex over the convexity. Brainstem and cerebellum intact. Small area of acute infarct in the gyrus rectus on the left. Ventricle size normal. No shift of the midline structures. No mass lesion. Occluded right internal carotid artery is unchanged. The left internal carotid artery now patent with normal flow void. Both vertebral arteries and the basilar are patent. Postcontrast imaging reveals no enhancing mass lesion. Mild  vascular enhancement in the region of the left frontal hematoma which is likely a hemorrhagic infarction. Dural sinuses are patent. IMPRESSION: Subarachnoid hemorrhage on the left. Left frontal hematoma likely related to hemorrhagic infarction. Acute infarct in the left caudate and putamen. Scattered small areas of acute infarct involving the left gyrus rectus and insular cortex. Scattered small areas of acute infarct in the left frontal parietal cortex and in the right frontal cortex. The right internal carotid artery remains occluded. Left internal carotid artery now appears patent through the cavernous segment. Electronically Signed   By: Franchot Gallo M.D.   On: 05/10/2015 13:49     STUDIES:  Head Ct 3/18 >> probable early L MCA infarct CT angio Head and neck 3/18 >> acute thrombus L M2 segment, occlusion L ICA, prox stenosis innominate, chronic occlusion R ICA with collaterals Brain MRI 3/19 >> Subarachnoid hemorrhage on the left. Left frontal hematoma likely related to hemorrhagic infarction.  Acute infarct in the left caudate and putamen. Scattered small areas of acute infarct  RICA occluded Echo 3/19 nml LV fn  CULTURES: Resp 3/18 >>   ANTIBIOTICS: unasyn 3/18 >>   SIGNIFICANT EVENTS: S/p IR for clot retrieval 3/18  LINES/TUBES: ETT 3/18 >>  Femoral art sheath 3/18 >>  Radial art 3/18 >>   DISCUSSION: 65 yo woman, admitted with acute L temporal CVA, s/p t-PA and then IR retrieval clot L ICA  ASSESSMENT / PLAN:  PULMONARY A: Acute respiratory failure due to neuro status Possible RML aspiration PNA P:    SBts with goal extubation  CARDIOVASCULAR A:  Relative hypotension on sedation - A -line reads much higher than cuff Echo - nml LV fn P:  Continue phenylephrine to maintain goal SBP 120-140. Anticipate this will wean to off once propofol off    RENAL A:   Non-AG acidosis hypomag P:   Replete lytes as needed  GASTROINTESTINAL A:   SUP P:    PPI  HEMATOLOGIC A:   S/p t-PA P:  Bleeding precautions Follow CBC  INFECTIOUS A:   Suspected RML aspiration PNA P:   Unasyn 3/18 Follow resp cx -can stop if neg  ENDOCRINE A:   Hyperglycemia  P:   Follow CBG  NEUROLOGIC A:   Acute L ICA CVA s/p t-PA and IR retrieval P:   RASS goal: 0 Propofol + fentanyl prn    FAMILY  - Updates: husband 3/20  - Inter-disciplinary family meet or Palliative Care meeting due by:  04/18/15   Independent CC time 35 minutes  Kara Mead MD. [facility]. [place] Pulmonary & Critical care Pager [phone] If no response call [phone]   05/11/2015     05/11/2015, 9:31 AM

## 2015-05-11 NOTE — Progress Notes (Signed)
OT Cancellation Note  Patient Details Name: Maraina Grosz MRN: [id number] DOB: August 15, 1950   Cancelled Treatment:    Reason Eval/Treat Not Completed: Patient not medically ready.  Darlina Rumpf Grimsley, OTR/L [id number]  05/11/2015, 9:53 AM

## 2015-05-11 NOTE — Procedures (Signed)
Extubation Procedure Note  Patient Details:   Name: Monica Pugh DOB: 1950/08/01 MRN: [id number]   Airway Documentation:     Evaluation  O2 sats: stable throughout Complications: No apparent complications Patient did tolerate procedure well. Bilateral Breath Sounds: Clear, Diminished Suctioning: Airway, Oral No  Patient tolerated wean. MD ordered to extubate. Positive for cuff leak. Patient extubated to a 3 Lpm Nasal cannula. No signs of dyspnea. A couple of stridorous breaths noted but then subsided. RN and MD aware. Not able to follow commands at this time for IS. Will continue to monitor.   Myrtie Neither 05/11/2015, 11:00 AM

## 2015-05-11 NOTE — Progress Notes (Signed)
PT Cancellation Note  Patient Details Name: Monica Pugh MRN: [id number] DOB: 21-Mar-1950   Cancelled Treatment:    Reason Eval/Treat Not Completed: Patient not medically ready.  Spoke with RN who indicates pt alert and not ready for PT at this time.  Will f/u as appropriate.     Catarina Hartshorn, [place] 05/11/2015, 9:03 AM

## 2015-05-12 ENCOUNTER — Telehealth: Payer: Self-pay | Admitting: *Deleted

## 2015-05-12 ENCOUNTER — Encounter (HOSPITAL_COMMUNITY): Payer: Self-pay

## 2015-05-12 ENCOUNTER — Ambulatory Visit (HOSPITAL_COMMUNITY): Payer: Medicaid Other

## 2015-05-12 DIAGNOSIS — I63512 Cerebral infarction due to unspecified occlusion or stenosis of left middle cerebral artery: Secondary | ICD-10-CM | POA: Insufficient documentation

## 2015-05-12 DIAGNOSIS — I9589 Other hypotension: Secondary | ICD-10-CM

## 2015-05-12 DIAGNOSIS — Z0289 Encounter for other administrative examinations: Secondary | ICD-10-CM

## 2015-05-12 LAB — GLUCOSE, CAPILLARY
Glucose-Capillary: 109 mg/dL — ABNORMAL HIGH (ref 65–99)
Glucose-Capillary: 66 mg/dL (ref 65–99)
Glucose-Capillary: 71 mg/dL (ref 65–99)
Glucose-Capillary: 73 mg/dL (ref 65–99)
Glucose-Capillary: 78 mg/dL (ref 65–99)
Glucose-Capillary: 81 mg/dL (ref 65–99)
Glucose-Capillary: 83 mg/dL (ref 65–99)
Glucose-Capillary: 91 mg/dL (ref 65–99)

## 2015-05-12 LAB — TRIGLYCERIDES: Triglycerides: 88 mg/dL (ref <150)

## 2015-05-12 LAB — CULTURE, RESPIRATORY W GRAM STAIN: Culture: NORMAL

## 2015-05-12 MED ORDER — IPRATROPIUM-ALBUTEROL 0.5-2.5 (3) MG/3ML IN SOLN
3.0000 mL | RESPIRATORY_TRACT | Status: DC | PRN
  Administered 2015-05-13: 3 mL via RESPIRATORY_TRACT
  Filled 2015-05-12: qty 3

## 2015-05-12 MED ORDER — CHLORHEXIDINE GLUCONATE 0.12 % MT SOLN
OROMUCOSAL | Status: AC
  Administered 2015-05-12: 15 mL
  Filled 2015-05-12: qty 15

## 2015-05-12 MED ORDER — CHLORHEXIDINE GLUCONATE 0.12 % MT SOLN
OROMUCOSAL | Status: AC
  Filled 2015-05-12: qty 15

## 2015-05-12 MED ORDER — RESOURCE THICKENUP CLEAR PO POWD
Freq: Once | ORAL | Status: DC
  Filled 2015-05-12: qty 125

## 2015-05-12 MED ORDER — DEXTROSE 50 % IV SOLN: 50.0000 mL | Freq: Once | INTRAVENOUS | Status: AC

## 2015-05-12 MED ORDER — DEXTROSE 50 % IV SOLN
INTRAVENOUS | Status: AC
  Filled 2015-05-12: qty 50

## 2015-05-12 NOTE — Progress Notes (Signed)
Rehab Admissions Coordinator Note:  Patient was screened by Cleatrice Burke for appropriateness for an Inpatient Acute Rehab Consult per OT recommendation.   At this time, we are recommending Inpatient Rehab consult. Please place order when you feel pt medically appropriate.  Cleatrice Burke 05/12/2015, 2:48 PM  I can be reached at [phone].

## 2015-05-12 NOTE — Progress Notes (Addendum)
PULMONARY / CRITICAL CARE MEDICINE   Name: Monica Pugh MRN: [id number] DOB: 06/11/1950    ADMISSION DATE:  05/09/2015 CONSULTATION DATE:  05/09/15  REFERRING MD:  Silverio Decamp  CHIEF COMPLAINT:  VDRF  HISTORY OF PRESENT ILLNESS:   65 yo woman, very little available currently about her hx. She was last seen normal around 10:00, was then found aphasic, unresponsive with facial droop and difficulty managing secretions by husband at 11:15. Was taken to  ED and then tx to [facility]. t-PA was started at 12:05. She was intubated in ED for airway protection, went to IR for angio and clot retrieval.   SUBJECTIVE:  On low dose neo gtt Wheezing overnight  VITAL SIGNS: BP 123/53 mmHg  Pulse 72  Temp(Src) 99 F (37.2 C) (Core (Comment))  Resp 25  Ht 5\' 6"  (1.676 m)  Wt 187 lb 2.7 oz (84.9 kg)  BMI 30.22 kg/m2  SpO2 95%  HEMODYNAMICS:    VENTILATOR SETTINGS:    INTAKE / OUTPUT: I/O last 3 completed shifts: In: 4510.4 [I.V.:4010.4; IV Piggyback:500] Out: 1475 [id number]  PHYSICAL EXAMINATION: General:  Elderly woman,  Neuro:  HEENT:  No JVD, PERRL Cardiovascular:  Regular, no M Lungs:  Coarse B  Abdomen:  Soft, benign, + BS Musculoskeletal:  No lesions noted Skin:  Warm, no rash  LABS:  BMET  Recent Labs Lab 05/09/15 1152 05/09/15 1201 05/10/15 0525  NA 140 140 138  K 4.3 4.2 3.6  CL 104 103 110  CO2 25  --  20*  BUN 9 11 6   CREATININE 0.85 0.90 0.85  GLUCOSE 84 79 111*    Electrolytes  Recent Labs Lab 05/09/15 1152 05/10/15 0525  CALCIUM 9.3 7.4*  MG  --  1.4*  PHOS  --  3.7    CBC  Recent Labs Lab 05/09/15 1152 05/09/15 1201 05/10/15 0525  WBC 9.1  --  12.7*  HGB 15.7* 17.7* 12.2  HCT 47.1* 52.0* 36.3  PLT 244  --  217    Coag's  Recent Labs Lab 05/09/15 1218  APTT 28  INR 1.08    Sepsis Markers No results for input(s): LATICACIDVEN, PROCALCITON, O2SATVEN in the last 168 hours.  ABG  Recent Labs Lab 05/09/15 1801  PHART  7.311*  PCO2ART 40.6  PO2ART 95.0    Liver Enzymes  Recent Labs Lab 05/09/15 1152 05/10/15 0525  AST 20  --   ALT 14  --   ALKPHOS 75  --   BILITOT 0.8  --   ALBUMIN 3.8 2.7*    Cardiac Enzymes No results for input(s): TROPONINI, PROBNP in the last 168 hours.  Glucose  Recent Labs Lab 05/11/15 1609 05/11/15 2004 05/11/15 2038 05/11/15 2339 05/12/15 0331 05/12/15 0808  GLUCAP 148* 54* 141* 78 83 81    Imaging Ct Head Wo Contrast  05/11/2015  CLINICAL DATA:  Continued surveillance intracranial hemorrhage and cerebral infarction. EXAM: CT HEAD WITHOUT CONTRAST TECHNIQUE: Contiguous axial images were obtained from the base of the skull through the vertex without intravenous contrast. COMPARISON:  Multiple priors. Most recent CT 05/09/2015. Most recent MR 05/10/2015. FINDINGS: Nonhemorrhagic cerebral infarction most notable in the LEFT lentiform nucleus. Secondary involvement of the LEFT caudate, and surrounding white matter. Hemorrhagic infarct affects the LEFT medial frontal lobe in conjunction with diffuse subarachnoid hemorrhage. The subarachnoid hemorrhage has normalized slightly in comparison with 3/18, but the cytotoxic edema in the LEFT basal ganglia is worse. The area of cytotoxic edema within the hemorrhagic LEFT ACA territory infarct  is also worse. No new areas of infarction are seen.  Calvarium intact. IMPRESSION: Improving subarachnoid hemorrhage. Progression of cytotoxic edema in the infarcted tissue of the LEFT hemisphere is described. No new areas of involvement. Electronically Signed   By: Staci Righter M.D.   On: 05/11/2015 15:36     STUDIES:  Head Ct 3/18 >> probable early L MCA infarct CT angio Head and neck 3/18 >> acute thrombus L M2 segment, occlusion L ICA, prox stenosis innominate, chronic occlusion R ICA with collaterals Brain MRI 3/19 >> Subarachnoid hemorrhage on the left. Left frontal hematoma likely related to hemorrhagic infarction.  Acute  infarct in the left caudate and putamen. Scattered small areas of acute infarct  RICA occluded Echo 3/19 nml LV fn  CULTURES: Resp 3/18 >> nml flora  ANTIBIOTICS: unasyn 3/18 >>   SIGNIFICANT EVENTS: S/p IR for clot retrieval 3/18  LINES/TUBES: ETT 3/18 >> 3/21 Femoral art sheath 3/18 >>  Radial art 3/18 >>   DISCUSSION: 65 yo woman, admitted with acute L temporal CVA, s/p t-PA and then IR retrieval clot L ICA  ASSESSMENT / PLAN:  PULMONARY A: Acute respiratory failure due to neuro status Possible RML aspiration PNA Heavy smoker, prob COPD P:   duonebs  extubated  CARDIOVASCULAR A:  Relative hypotension on sedation - A -line reads much higher than cuff Echo - nml LV fn P:  Continue phenylephrine to maintain goal SBP 110-140. Anticipate this will wean to off  Now that parameters lowered   RENAL A:   Non-AG acidosis hypomag P:   Replete lytes as needed  GASTROINTESTINAL A:   SUP P:   PPI Swallow eval  HEMATOLOGIC A:   S/p t-PA P:  Bleeding precautions Follow CBC  INFECTIOUS A:   Suspected RML aspiration PNA P:   Unasyn 3/18 Can stop if CXR clear on 3/22  ENDOCRINE A:   Hyperglycemia  P:   Follow CBG  NEUROLOGIC A:   Acute L ICA CVA s/p t-PA and IR retrieval P:   rehab    FAMILY  - Updates: husband 3/20  - Inter-disciplinary family meet or Palliative Care meeting due by: NA     Kara Mead MD. [facility]P. [place] [facility] Pager [phone] If no response call [phone]   05/12/2015     05/12/2015, 10:11 AM

## 2015-05-12 NOTE — Progress Notes (Signed)
eLink Physician-Brief Progress Note Patient Name: Monica Pugh DOB: 05/03/1950 MRN: [id number]   Date of Service  05/12/2015  HPI/Events of Note  Camera check on patient postextubation. Resting comfortably in bed on room air. Saturation 91-94%. Nurse at bedside reports patient is wheezing. Admission H&P shows no history of tobacco use on file. Nurpatient does chronically smoke 1.5 packs per day.  eICU Interventions  Starting DuoNeb every 4 hours when necessary     Intervention Category Major Interventions: Respiratory failure - evaluation and management  Tera Partridge 05/12/2015, 3:03 AM

## 2015-05-12 NOTE — Evaluation (Signed)
Physical Therapy Evaluation Patient Details Name: Monica Pugh MRN: [id number] DOB: 1950/03/20 Today's Date: 05/12/2015   History of Present Illness  65 y.o. female admitted with symptoms of aphasia, Rt neglect, and right sensorimotor deficits.  TPA was administered. She was intubated to protect her airway (extubated 3/20).  CT angio showed Rt ICA occlusion; IR thrombectomy with revascularization of the Lt ICA and MCA, and stent placed proximal ICA.  Post procedure showed left medial frontal intraparenchymal with left frontal subarachnoid hemmorhage.  No pertinent PMH    Clinical Impression  Pt admitted with above diagnosis. Pt currently with functional limitations due to the deficits listed below (see PT Problem List). Monica Pugh was Ind PTA.  She presents w/ impaired balance and cognition as well as decreased strength.  She currently requires +2 assist for transfers.  Pt would be a fantastic candidate for CIR. Pt will benefit from skilled PT to increase their independence and safety with mobility to allow discharge to the venue listed below.      Follow Up Recommendations CIR;Supervision/Assistance - 24 hour    Equipment Recommendations  Other (comment) (TBD)    Recommendations for Other Services Rehab consult     Precautions / Restrictions Precautions Precautions: Fall Restrictions Weight Bearing Restrictions: No      Mobility  Bed Mobility Overal bed mobility: Needs Assistance Bed Mobility: Supine to Sit     Supine to sit: Min assist     General bed mobility comments: requires assist to initiate activity and assist for her balance   Transfers Overall transfer level: Needs assistance Equipment used: 2 person hand held assist Transfers: Sit to/from Stand;Stand Pivot Transfers Sit to Stand: Min assist;+2 physical assistance Stand pivot transfers: Min assist;+2 physical assistance       General transfer comment: Pt with buckling of Rt knee, but she was able to control  adequately to prevent loss of control.  Pt slow to initiate movement on Rt.   Ambulation/Gait             General Gait Details: not assessed  Stairs            Wheelchair Mobility    Modified Rankin (Stroke Patients Only) Modified Rankin (Stroke Patients Only) Pre-Morbid Rankin Score: No symptoms Modified Rankin: Moderately severe disability     Balance Overall balance assessment: Needs assistance Sitting-balance support: Feet supported Sitting balance-Leahy Scale: Fair Sitting balance - Comments: min A initially for sitting EOB.  Progressed to min guard assist.  Able to reach with Lt and Rt UE    Standing balance support: Bilateral upper extremity supported;During functional activity Standing balance-Leahy Scale: Poor Standing balance comment: requires Bil UE support                             Pertinent Vitals/Pain Pain Assessment: No/denies pain    Home Living Family/patient expects to be discharged to:: Private residence Living Arrangements: Spouse/significant other Available Help at Discharge: Family;Available 24 hours/day Type of Home: House Home Access: Level entry     Home Layout: One level Home Equipment: None Additional Comments: Pt retired from factory/assembly type job     Prior Function Level of Independence: Independent               Journalist, newspaper   Dominant Hand: Right    Extremity/Trunk Assessment   Upper Extremity Assessment: Defer to OT evaluation RUE Deficits / Details: Pt initially does not attempt to move Rt UE  on command.  However, spontaneous movement noted when she was moving to EOB.  After that, pt was able to reach to ~80 scaption, and was able to wipe eyes with washcloth using Rt UE.  Decreased flexion/ext of digits noted.  Edema noted          Lower Extremity Assessment: Difficult to assess due to impaired cognition;RLE deficits/detail;LLE deficits/detail RLE Deficits / Details: Pt not following  commands well enough for formal MMT.  However, pt does not require assist for LE management during bed mobility or transfers (although partial Rt knee buckle) and even attempts to assist during pericare by abducting her hips. LLE Deficits / Details: Pt not following commands well enough for formal MMT.  However, pt does not require assist for LE management during bed mobility or transfers (although partial Rt knee buckle) and even attempts to assist during pericare by abducting her hips.  Cervical / Trunk Assessment: Normal  Communication   Communication: HOH  Cognition Arousal/Alertness: Awake/alert Behavior During Therapy: Flat affect Overall Cognitive Status: Impaired/Different from baseline Area of Impairment: Attention;Following commands;Problem solving   Current Attention Level: Sustained   Following Commands: Follows one step commands with increased time;Follows one step commands consistently     Problem Solving: Slow processing;Decreased initiation;Requires verbal cues;Requires tactile cues General Comments: Pt very slow to initiate movement     General Comments General comments (skin integrity, edema, etc.): VSS.  Family present during eval     Exercises        Assessment/Plan    PT Assessment Patient needs continued PT services  PT Diagnosis Difficulty walking;Hemiplegia dominant side;Altered mental status   PT Problem List Decreased strength;Decreased balance;Decreased activity tolerance;Decreased mobility;Decreased cognition;Decreased knowledge of use of DME;Decreased safety awareness;Impaired sensation  PT Treatment Interventions DME instruction;Gait training;Functional mobility training;Therapeutic activities;Therapeutic exercise;Balance training;Neuromuscular re-education;Cognitive remediation;Patient/family education   PT Goals (Current goals can be found in the Care Plan section) Acute Rehab PT Goals Patient Stated Goal: Pt did not state.  Family hopes she will be  able to regain independence  PT Goal Formulation: With family Time For Goal Achievement: 06/02/15 Potential to Achieve Goals: Good    Frequency Min 4X/week   Barriers to discharge        Co-evaluation PT/OT/SLP Co-Evaluation/Treatment: Yes Reason for Co-Treatment: For patient/therapist safety PT goals addressed during session: Mobility/safety with mobility;Balance;Strengthening/ROM OT goals addressed during session: ADL's and self-care;Strengthening/ROM       End of Session Equipment Utilized During Treatment: Gait belt Activity Tolerance: Patient limited by fatigue Patient left: in chair;with call bell/phone within reach;with family/visitor present Nurse Communication: Mobility status;Other (comment) (Pt would like assist to eat lunch)         Time: 1323-1400 PT Time Calculation (min) (ACUTE ONLY): 37 min   Charges:   PT Evaluation $PT Eval High Complexity: 1 Procedure     PT G Codes:       Collie Siad PT, DPT  Pager: [phone] Phone: [phone] 05/12/2015, 3:23 PM

## 2015-05-12 NOTE — Progress Notes (Signed)
STROKE TEAM PROGRESS NOTE   HISTORY OF PRESENT ILLNESS Monica Pugh is an 65 y.o. female patient who was brought in via EMS for evaluation of altered mental status and unresponsiveness. Patient with no symptom past medical history. Does not take any medications at home except for occasional acid pills. Her has been last spoke to the patient at 10 AM, which is the time she was last seen normal. When he returned home after some shopping errands, he found her unresponsive, nonverbal and not following commands. EMS were called and brought her to the ER.   EMS Arrival date & time 05/09/15 1137 Patient arrived in the ER room after CT at 1155 IV TPA initiated at 1205   Date last known well: 05/09/2015 Time last known well: 10 am tPA Given: Yes   SUBJECTIVE (INTERVAL HISTORY)  family  Is at bedside. Patient is awake and more interactive but remains slightly abulic and aphasic    OBJECTIVE Temp:  [99 F (37.2 C)-100.4 F (38 C)] 99 F (37.2 C) (03/21 1000) Pulse Rate:  [55-88] 70 (03/21 1000) Cardiac Rhythm:  [-] Sinus bradycardia (03/21 0800) Resp:  [14-30] 23 (03/21 1000) BP: (84-126)/(43-60) 120/54 mmHg (03/21 1000) SpO2:  [91 %-100 %] 98 % (03/21 1000) Arterial Line BP: (106-174)/(41-58) 123/50 mmHg (03/21 0300)  CBC:   Recent Labs Lab 05/09/15 1152 05/09/15 1201 05/10/15 0525  WBC 9.1  --  12.7*  NEUTROABS 4.6  --  8.1*  HGB 15.7* 17.7* 12.2  HCT 47.1* 52.0* 36.3  MCV 94.8  --  95.5  PLT 244  --  [id number]    Basic Metabolic Panel:   Recent Labs Lab 05/09/15 1152 05/09/15 1201 05/10/15 0525  NA 140 140 138  K 4.3 4.2 3.6  CL 104 103 110  CO2 25  --  20*  GLUCOSE 84 79 111*  BUN 9 11 6   CREATININE 0.85 0.90 0.85  CALCIUM 9.3  --  7.4*  MG  --   --  1.4*  PHOS  --   --  3.7    Lipid Panel:     Component Value Date/Time   CHOL 144 05/10/2015 0525   TRIG 88 05/12/2015 0520   HDL 29* 05/10/2015 0525   CHOLHDL 5.0 05/10/2015 0525   VLDL 33 05/10/2015 0525    LDLCALC 82 05/10/2015 0525   HgbA1c:  Lab Results  Component Value Date   HGBA1C 5.7* 05/10/2015   Urine Drug Screen:     Component Value Date/Time   LABOPIA NONE DETECTED 05/09/2015 1224   COCAINSCRNUR NONE DETECTED 05/09/2015 1224   LABBENZ NONE DETECTED 05/09/2015 1224   AMPHETMU NONE DETECTED 05/09/2015 1224   THCU NONE DETECTED 05/09/2015 1224   LABBARB NONE DETECTED 05/09/2015 1224      IMAGING  Ct Angio Head and Neck W/cm &/or Wo Cm 05/09/2015   Acute thrombus left M2 segment supplying the left temporal lobe. Occlusion of the proximal left internal carotid carotid artery which is presumably acute. 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 This may be chronic. There is reconstitution of the right internal carotid artery in the supraclinoid segment. Right anterior and middle cerebral arteries are patent bilaterally. Endotracheal tube right main bronchus.  Recommend withdrawal 3 cm.     Ct Head Wo Contrast 05/09/2015   Contrast opacified acute left subarachnoid bleed as described above.     Ct Head Wo Contrast 05/09/2015   Increased attenuation in the left middle cerebral  artery compared to the right side. This finding is concerning for early acute infarct in the left middle cerebral artery distribution. Somewhat unusual appearing area of decreased attenuation in the medial left frontal lobe. This area may warrant pre and post intravenous contrast brain MRI to assess for possible atypical mass in this area. Atypical infarct in this area is possible, but the distribution and appearance is somewhat unusual for infarct.     [name] Contrast 05/10/2015   Subarachnoid hemorrhage on the left. Left frontal hematoma likely related to hemorrhagic infarction. Acute infarct in the left caudate and putamen. Scattered small areas of acute infarct involving the left gyrus rectus and insular cortex. Scattered small areas of  acute infarct in the left frontal parietal cortex and in the right frontal cortex. The right internal carotid artery remains occluded. Left internal carotid artery now appears patent through the cavernous segment.     Ct Cerebral Perfusion W/cm 05/09/2015   Core infarct in the left basal ganglia and frontal temporal lobe. There is an area of penumbra which is larger than the core infarct involves the left temporal and frontal lobe. Less than 1/3 of MCA territory involved.     Dg Chest Port 1 View 05/10/2015   1. Endotracheal tube tip is 5 cm above the carina.  2. Possible trace left pleural effusion.  3. Decreased right mid lung opacity, likely decreased atelectasis.  4. Stable left lung base opacity, either atelectasis or pneumonia.    Portable Chest Xray 05/09/2015   1. Well-positioned support structures as described.  2. New hazy opacity in the right mid lung, probably atelectasis, cannot exclude mild pulmonary edema or aspiration.  3. Increased left basilar opacity, probably atelectasis.    Dg Chest Portable 1 View 05/09/2015   1. The ET tube is low lying but does not extend into either mainstem bronchus. Recommend withdrawing 1 or 1.5 cm.  2. Mild interstitial prominence may be due to technique. Recommend attention on follow-up    Dg Abd Portable 1v 05/09/2015   Orogastric tube tip in the mid stomach.    Dg Abd Portable 1v 05/09/2015   Enteric tube terminates in the gastric fundus. Nonobstructive bowel gas pattern.     CEREBRAL ANGIOGRAM [id number] (Custom)]      Expand All Collapse All   S/P innominate artery angiogram,Lt common carotid arteriogram,Lt Vert angiogram,followed by complete revascularization of occluded sup division Lt ICA using 10 mg of superselective intracranial tpa and x 1 pass with Solitaire 12mm x 23mm retrieval device ,and complete revascularization of symptomatic occluded LT ICA prox with stentassisted angioplasty and rescue fibrinilysis of intrastent  thrombus with 20 mg Of IA Integrelin ,5mg  of IA TPA and 6 mg of IA Reopro      PHYSICAL EXAM 65 year old Caucasian female. Afebrile. Head is nontraumatic. Neck is supple without bruit. Cardiac exam no murmur or gallop.  Skin is warm and distally well perfused. Neurological Exam :Awake laert Does   follow commands  Abulic. Expressive greater than receptive aphasia. Pupils equally round and reactive to light. Conjugate midline gaze. Intact cough and gag. Intact oculocephalic reflex.Mild right lower facial symmetry  Mild right hemiparesis. 2/5 RUE and 4/5 RLE strength but effort poor and variable.. Withdraws bilaterally in the lowers but not the uppers left more than right side. Right toe is up and left toe is down.    ASSESSMENT/PLAN Ms. Monica Pugh is a 65 y.o. female with no significant past medical history presenting with unresponsiveness and  aphasia.  She received IV TPA  at 1205 on 02/08/2016 with subsequent hemorrhage.   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  MRI  Subarachnoid hemorrhage on the left. Left frontal hematoma likely related to hemorrhagic infarction versus post TPA hemorrhage.Acute infarct in the left caudate and putamen. Scattered small areas of acute infarct involving the left gyrus rectus and insular cortex. Scattered small areas of acute infarct in the left frontal parietal cortex and in the right frontal cortex. The right internal carotid artery remains occluded. Left internal carotid artery now appears patent through the cavernous segment.     MRA  not performed  Carotid Doppler refer to CTA of the neck  CTA - see above 2D Echo   Left ventricle: The cavity size was normal. Wall thickness was  increased in a pattern of mild LVH. Systolic function was normal.  The estimated ejection fraction was in the range of 55% to  60%.  Wall motion was normal; there were no regional wall motion   abnormalities  LDL 82  HgbA1c 5.7  VTE prophylaxis - SCDs DIET DYS 2 Room service appropriate?: Yes; Fluid consistency:: Nectar Thick  No antithrombotic prior to admission, now on aspirin 325 milligrams daily with Plavix 75 mg daily (NPO)  Ongoing aggressive stroke risk factor management  Therapy recommendations:  Pending  Disposition:  Pending  Hypertension  Blood pressure running low  Permissive hypertension (OK if < 220/120) but gradually normalize in 5-7 days. Due to interventional procedure, blood pressure is recommended 0000000 systolic.   Hyperlipidemia  Home meds:   No lipid lowering medications prior to admission  LDL 82, goal < 70  Add low-dose Lipitor when taking POs  Continue statin at discharge   Other Stroke Risk Factors  Advanced age  Obesity, Body mass index is 30.22 kg/(m^2).    Other Active Problems  Mild leukocytosis  VDRF  Hyportension  Suspected aspiration pneumonia   Hospital day # 3  This is a 65 year old female with a chronically occluded right carotid artery who was found aphasic and unresponsive.  IV TPA was administered. Following this she was taken for stat CT angiogram of the head and neck with perfusion study which showed a chronic right ICA occlusion and possible acute left ICA occlusion. She was taken prior thrombectomy procedure was complete revascularization of occluded superior division left ICA. CT of the brain following showed a left medial frontal intraparenchymal hemorrhage with left frontal subarachnoid hemorrhage. She was transported to the neuro ICU. Follow-up MRI of the brain showed that the right internal carotid artery remains occluded. Left internal carotid artery now appears patent through the cavernous segment.   This patient is critically ill and at significant risk of neurological worsening, death and care requires constant monitoring of  vital signs, hemodynamics,respiratory and cardiac monitoring,review of multiple databases, neurological assessment, discussion with family, other specialists and medical decision making of high complexity.I I spent 35 minutes of neurocritical care time in the care of this patient. Plan  . Check swallow eval by speech therapy. Physical occupational therapy consults. I had a long discussion with the patient's husband and son at the bedside and answered questions about her prognosis, stroke evaluation and answered questions. Antony Contras, MD [facility] Pager: [phone]       To contact Stroke Continuity provider, please refer to [email or web address] After hours, contact General Neurology

## 2015-05-12 NOTE — Progress Notes (Signed)
Carotid Duplex not completed at this time. Patient just got to chair. Spoke with RN Morey Hummingbird. Will try again tomorrow morning.   Landry Mellow, RDMS, RVT Vascular lab

## 2015-05-12 NOTE — Evaluation (Signed)
Speech Language Pathology Evaluation Patient Details Name: Monica Pugh MRN: [id number] DOB: 03/09/1950 Today's Date: 05/12/2015 Time: [id number] SLP Time Calculation (min) (ACUTE ONLY): 26 min  Problem List:  Patient Active Problem List   Diagnosis Date Noted  . 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 Arterial ischemic stroke, MCA (middle cerebral artery), left, acute ([place]) 05/09/2015   Past Medical History: History reviewed. No pertinent past medical history. Past Surgical History:  Past Surgical History  Procedure Laterality Date  . Tubal ligation    . Radiology with anesthesia N/A 05/09/2015    Procedure: RADIOLOGY WITH ANESTHESIA;  Surgeon: Luanne Bras, MD;  Location: [facility];  Service: Radiology;  Laterality: N/A;   HPI:  65 y.o. female patient who presented with symptoms of aphasia, right neglect and evidence of right sensorimotor deficits. Intubated;  TPA administered; CT angiogram showed right ICA occlusion; IR thrombectomy with revascularization of left ICA and MCA, stent placed proximal internal carotid. Post-procedure CT showed left medial frontal intraparenchymal hemorrhage with left frontal subarachnoid hemorrhage.  ETT 3/18-3/20.   Assessment / Plan / Recommendation Clinical Impression  Pt presents with a nonfluent aphasia marked by decreased initiation, delayed response time, impaired naming; mild-mod deficits in comprehension for command following and discrimination.  There is decreased spontaneity of verbal output; speech is clear with no dysarthria; inattention to right side. Pt will benefit from SLP intervention to address language/ communication deficits.  Educated spouse/family re: nature of aphasia. Recommend CIR;  family can provide 24 hour support.     SLP Assessment  Patient needs continued Speech Lanaguage Pathology Services    Follow Up Recommendations  Inpatient Rehab    Frequency and Duration min 2x/week  2 weeks      SLP Evaluation Prior  Functioning  Cognitive/Linguistic Baseline: Within functional limits Type of Home: House  Lives With: Spouse Available Help at Discharge: Family   Cognition  Overall Cognitive Status: Impaired/Different from baseline Arousal/Alertness: Awake/alert Orientation Level: Oriented to person;Oriented to place;Oriented to time Attention: Focused Focused Attention: Impaired Focused Attention Impairment: Verbal basic;Functional basic Safety/Judgment: Impaired    Comprehension  Auditory Comprehension Overall Auditory Comprehension: Impaired Yes/No Questions: Impaired Basic Biographical Questions: 51-75% accurate Commands: Impaired One Step Basic Commands: 75-100% accurate Conversation: Simple Interfering Components: Attention Reading Comprehension Reading Status: Impaired Word level: Impaired    Expression Expression Primary Mode of Expression: Verbal Verbal Expression Overall Verbal Expression: Impaired Initiation: Impaired Automatic Speech: Social Response;Counting;Day of week (able to engage in concert with SLP) Repetition: No impairment Naming: Impairment Responsive: 51-75% accurate Confrontation: Impaired Convergent: 50-74% accurate Written Expression Dominant Hand: Right Written Expression: Not tested   Oral / Motor  Oral Motor/Sensory Function Overall Oral Motor/Sensory Function: Mild impairment Motor Speech Overall Motor Speech: Appears within functional limits for tasks assessed   GO             Amanda L. Tivis Ringer, [place] CCC/SLP Pager [phone]       Juan Quam Laurice 05/12/2015, 10:27 AM

## 2015-05-12 NOTE — Telephone Encounter (Signed)
Patient FMLA form on [place] desk.

## 2015-05-12 NOTE — Evaluation (Signed)
Occupational Therapy Evaluation Patient Details Name: Monica Pugh MRN: [id number] DOB: 1950-05-25 Today's Date: 05/12/2015    History of Present Illness 65 y.o. female admitted with symptoms of aphasia, Rt neglect, and right sensorimotor deficits.  TPA was administered. She was intubated to protect her airway (extubated 3/20).  CT angio showed Rt ICA occlusion; IR thrombectomy with revascularization of the Lt ICA and MCA, and stent placed proximal ICA.  Post procedure showed left medial frontal intraparenchymal with left frontal subarachnoid hemmorhage.  No pertinent PMH   Clinical Impression   Pt admitted with above. She demonstrates the below listed deficits and will benefit from continued OT to maximize safety and independence with BADLs.  Pt presents to OT with Rt hemiparesis - did begin using Rt UE during eval; impaired balance, impaired communication, impaired cognition, apraxia.  Would benefit from further visual assessment.  Currently, she requires max A for ADLs.  She is very motivated to improve and has great family support.  Feel she would be an excellent candidate for CIR      Follow Up Recommendations  CIR;Supervision/Assistance - 24 hour    Equipment Recommendations  3 in 1 bedside comode;Tub/shower bench    Recommendations for Other Services Rehab consult     Precautions / Restrictions Precautions Precautions: Fall      Mobility Bed Mobility Overal bed mobility: Needs Assistance Bed Mobility: Supine to Sit     Supine to sit: Min assist     General bed mobility comments: requires assist to initiate activity and assist for her balance   Transfers Overall transfer level: Needs assistance Equipment used: 2 person hand held assist Transfers: Sit to/from Stand;Stand Pivot Transfers Sit to Stand: Min assist;+2 physical assistance Stand pivot transfers: Min assist;+2 physical assistance       General transfer comment: Pt with buckling of Rt knee, but she was  able to control adequately to prevent loss of control.  Pt slow to initiate movement on Rt.     Balance Overall balance assessment: Needs assistance Sitting-balance support: Feet supported Sitting balance-Leahy Scale: Fair Sitting balance - Comments: min A initially for sitting EOB.  Progressed to min guard assist.  Able to reach with Lt and Rt UE    Standing balance support: Bilateral upper extremity supported Standing balance-Leahy Scale: Poor                              ADL Overall ADL's : Needs assistance/impaired Eating/Feeding: Moderate assistance;Sitting   Grooming: Wash/dry hands;Wash/dry face;Brushing hair;Moderate assistance;Sitting   Upper Body Bathing: Maximal assistance;Sitting   Lower Body Bathing: Maximal assistance;Sit to/from stand   Upper Body Dressing : Maximal assistance   Lower Body Dressing: Sit to/from stand;Total assistance   Toilet Transfer: Minimal assistance;+2 for physical assistance;BSC;Stand-pivot   Toileting- Clothing Manipulation and Hygiene: Maximal assistance;Sit to/from stand Toileting - Clothing Manipulation Details (indicate cue type and reason): Pt incontinent of stool.  Assisted with peri care in standing      Functional mobility during ADLs: Minimal assistance;+2 for physical assistance General ADL Comments: Pt is very motivated.  family present      Vision Additional Comments: Pt initially with eyes closed.  She is able to locate items on Lt and Rt.   She demonstrated difficulty participating in formal visual assessment    Perception Perception Perception Tested?: Yes Perception Deficits: Body part identification Comments: Unable to accurately assess due to communication deficits.  She required max A to  locate body parts    Praxis Praxis Praxis tested?: Deficits Deficits: Initiation;Ideomotor    Pertinent Vitals/Pain Pain Assessment: No/denies pain     Hand Dominance Right   Extremity/Trunk Assessment Upper  Extremity Assessment Upper Extremity Assessment: RUE deficits/detail RUE Deficits / Details: Pt initially does not attempt to move Rt UE on command.  However, spontaneous movement noted when she was moving to EOB.  After that, pt was able to reach to ~80 scaption, and was able to wipe eyes with washcloth using Rt UE.  Decreased flexion/ext of digits noted.  Edema noted  RUE Coordination: decreased fine motor;decreased gross motor   Lower Extremity Assessment Lower Extremity Assessment: Defer to PT evaluation       Communication Communication Communication: HOH   Cognition Arousal/Alertness: Awake/alert Behavior During Therapy: Flat affect Overall Cognitive Status: Impaired/Different from baseline Area of Impairment: Attention;Following commands;Problem solving   Current Attention Level: Sustained   Following Commands: Follows one step commands with increased time;Follows one step commands consistently     Problem Solving: Slow processing;Decreased initiation;Requires verbal cues;Requires tactile cues General Comments: Pt very slow to initiate movement    General Comments       Exercises       Shoulder Instructions      Home Living Family/patient expects to be discharged to:: Private residence Living Arrangements: Spouse/significant other Available Help at Discharge: Family;Available 24 hours/day Type of Home: House Home Access: Level entry     Home Layout: One level     Bathroom Shower/Tub: Tub/shower unit;Curtain Shower/tub characteristics: Architectural technologist: Standard     Home Equipment: None   Additional Comments: Pt retired from [employer] type job   Lives With: Spouse    Prior Functioning/Environment Level of Independence: Independent             OT Diagnosis: Generalized weakness;Cognitive deficits;Hemiplegia dominant side;Disturbance of vision;Apraxia   OT Problem List: Decreased strength;Decreased activity tolerance;Decreased range  of motion;Impaired balance (sitting and/or standing);Impaired vision/perception;Decreased coordination;Decreased cognition;Decreased safety awareness;Decreased knowledge of use of DME or AE;Decreased knowledge of precautions;Impaired tone;Impaired UE functional use;Increased edema   OT Treatment/Interventions: Self-care/ADL training;Neuromuscular education;DME and/or AE instruction;Therapeutic activities;Cognitive remediation/compensation;Visual/perceptual remediation/compensation;Patient/family education;Balance training    OT Goals(Current goals can be found in the care plan section) Acute Rehab OT Goals Patient Stated Goal: Pt did not state.  Family hopes she will be able to regain independence  OT Goal Formulation: With patient/family Time For Goal Achievement: 05/26/15 Potential to Achieve Goals: Good ADL Goals Pt Will Perform Grooming: with min guard assist;standing Pt Will Perform Upper Body Bathing: with set-up;with supervision;sitting Pt Will Perform Lower Body Bathing: with min assist;sit to/from stand Pt Will Perform Upper Body Dressing: with set-up;with supervision;sitting Pt Will Perform Lower Body Dressing: with min assist;sit to/from stand Pt Will Transfer to Toilet: with min guard assist;ambulating;regular height toilet;grab bars Pt Will Perform Toileting - Clothing Manipulation and hygiene: with min guard assist;sit to/from stand Additional ADL Goal #1: Pt will use Rt UE as dominant UE at least 75% of time  Additional ADL Goal #2: Pt will participate in further visual assessment   OT Frequency: Min 3X/week   Barriers to D/C:            Co-evaluation PT/OT/SLP Co-Evaluation/Treatment: Yes Reason for Co-Treatment: For patient/therapist safety   OT goals addressed during session: ADL's and self-care;Strengthening/ROM      End of Session Equipment Utilized During Treatment: Gait belt Nurse Communication: Mobility status  Activity Tolerance: Patient tolerated  treatment well Patient left:  in bed;with call bell/phone within reach;with family/visitor present   Time: 1325-1401 OT Time Calculation (min): 36 min Charges:  OT General Charges $OT Visit: 1 Procedure OT Evaluation $OT Eval Moderate Complexity: 1 Procedure G-Codes:    Conarpe, Ellard Artis M May 17, 2015, 2:42 PM

## 2015-05-12 NOTE — Telephone Encounter (Signed)
Pa 

## 2015-05-12 NOTE — Telephone Encounter (Signed)
Rn call patients son Chrissie Noa about FMLA paperwork he turn into [employer]. PTs son he needs the paperwork by May 25, 2015. PTs son his mom is still in the hospital and may not be discharge until 5 or 7 days later based on her progress. Rn explain that Dr.Sethi is currently in the hospital this week seeing stroke patients. Rn explain he will be in the office next week. Pt son he will come by this week and get the [facility] paperwork for Dr Leonie Man to filled out this week .

## 2015-05-12 NOTE — Evaluation (Signed)
Clinical/Bedside Swallow Evaluation Patient Details  Name: Monica Pugh MRN: [id number] Date of Birth: 04/08/1950  Today's Date: 05/12/2015 Time: SLP Start Time (ACUTE ONLY): 0931 SLP Stop Time (ACUTE ONLY): 0945 SLP Time Calculation (min) (ACUTE ONLY): 14 min  Past Medical History: History reviewed. No pertinent past medical history. Past Surgical History:  Past Surgical History  Procedure Laterality Date  . Tubal ligation    . Radiology with anesthesia N/A 05/09/2015    Procedure: RADIOLOGY WITH ANESTHESIA;  Surgeon: Luanne Bras, MD;  Location: [place];  Service: Radiology;  Laterality: N/A;   HPI:  65 y.o. female patient who presented with symptoms of aphasia, right neglect and evidence of right sensorimotor deficits. Intubated;  TPA administered; CT angiogram showed right ICA occlusion; IR thrombectomy with revascularization of left ICA and MCA, stent placed proximal internal carotid. Post-procedure CT showed left medial frontal intraparenchymal hemorrhage with left frontal subarachnoid hemorrhage.  ETT 3/18-3/20.   Assessment / Plan / Recommendation Clinical Impression  Pt presents with a neurogenic dysphagia marked by decreased attention/initiation of motor movements with prolonged mastication, likely delayed swallow response, overt s/s of aspiration with thin liquids, minimized with consumption of nectars.  Recommend initiating a dysphagia 2 diet with nectar-thick liquids; meds crushed with puree.  SLP will follow for safety, diet advancement.  D/W RN and family.     Aspiration Risk  Moderate aspiration risk    Diet Recommendation     Medication Administration: Crushed with puree    Other  Recommendations Oral Care Recommendations: Oral care BID Other Recommendations: Order thickener from pharmacy   Follow up Recommendations  Inpatient Rehab    Frequency and Duration min 2x/week  2 weeks       Prognosis Prognosis for Safe Diet Advancement: Good      Swallow Study    General Date of Onset: 05/09/15 HPI: 65 y.o. female patient who presented with symptoms of aphasia, right neglect and evidence of right sensorimotor deficits. Intubated;  TPA administered; CT angiogram showed right ICA occlusion; IR thrombectomy with revascularization of left ICA and MCA, stent placed proximal internal carotid. Post-procedure CT showed left medial frontal intraparenchymal hemorrhage with left frontal subarachnoid hemorrhage.  ETT 3/18-3/20. Type of Study: Bedside Swallow Evaluation Diet Prior to this Study: NPO Temperature Spikes Noted: Yes Respiratory Status: Nasal cannula History of Recent Intubation: Yes Length of Intubations (days): 2 days Date extubated: 05/11/15 Behavior/Cognition: Alert;Cooperative Oral Cavity Assessment: Within Functional Limits Oral Care Completed by SLP: Yes Oral Cavity - Dentition: Dentures, top Vision: Functional for self-feeding Self-Feeding Abilities: Able to feed self;Needs assist Patient Positioning: Upright in bed Baseline Vocal Quality: Normal Volitional Cough: Strong Volitional Swallow: Unable to elicit    Oral/Motor/Sensory Function Overall Oral Motor/Sensory Function: Mild impairment   Ice Chips Ice chips: Within functional limits Presentation: Spoon   Thin Liquid Thin Liquid: Impaired Presentation: Cup;Self Fed Pharyngeal  Phase Impairments: Suspected delayed Swallow;Wet Vocal Quality;Cough - Immediate    Nectar Thick Nectar Thick Liquid: Impaired Presentation: Cup Pharyngeal Phase Impairments: Suspected delayed Swallow;Multiple swallows   Honey Thick Honey Thick Liquid: Not tested   Puree Puree: Impaired Presentation: Self Fed;Spoon Pharyngeal Phase Impairments: Suspected delayed Swallow   Solid   GO   Solid: Impaired Presentation: Self Fed Oral Phase Impairments: Impaired mastication Oral Phase Functional Implications: Oral holding Pharyngeal Phase Impairments: Suspected delayed Swallow;Multiple swallows         Couture, Amanda Laurice 05/12/2015,10:17 AM   Estill Bamberg L. Tivis Ringer, [place] CCC/SLP Pager [phone]

## 2015-05-13 ENCOUNTER — Inpatient Hospital Stay (HOSPITAL_COMMUNITY)
Admission: RE | Admit: 2015-05-13 | Discharge: 2015-05-21 | DRG: 056 | Disposition: A | Discharge status: Home Health | Payer: Medicaid Other | Source: Intra-hospital | Attending: Physical Medicine & Rehabilitation | Admitting: Physical Medicine & Rehabilitation

## 2015-05-13 ENCOUNTER — Inpatient Hospital Stay (HOSPITAL_COMMUNITY): Payer: Medicaid Other

## 2015-05-13 ENCOUNTER — Encounter (HOSPITAL_COMMUNITY): Payer: Self-pay | Admitting: Physical Medicine and Rehabilitation

## 2015-05-13 ENCOUNTER — Encounter (HOSPITAL_COMMUNITY): Payer: Self-pay | Admitting: *Deleted

## 2015-05-13 DIAGNOSIS — R0602 Shortness of breath: Secondary | ICD-10-CM

## 2015-05-13 DIAGNOSIS — I6529 Occlusion and stenosis of unspecified carotid artery: Secondary | ICD-10-CM | POA: Diagnosis present

## 2015-05-13 DIAGNOSIS — F17210 Nicotine dependence, cigarettes, uncomplicated: Secondary | ICD-10-CM | POA: Diagnosis present

## 2015-05-13 DIAGNOSIS — I69353 Hemiplegia and hemiparesis following cerebral infarction affecting right non-dominant side: Secondary | ICD-10-CM | POA: Diagnosis present

## 2015-05-13 DIAGNOSIS — Z8249 Family history of ischemic heart disease and other diseases of the circulatory system: Secondary | ICD-10-CM

## 2015-05-13 DIAGNOSIS — I69951 Hemiplegia and hemiparesis following unspecified cerebrovascular disease affecting right dominant side: Secondary | ICD-10-CM | POA: Diagnosis not present

## 2015-05-13 DIAGNOSIS — I63512 Cerebral infarction due to unspecified occlusion or stenosis of left middle cerebral artery: Secondary | ICD-10-CM

## 2015-05-13 DIAGNOSIS — R4701 Aphasia: Secondary | ICD-10-CM | POA: Diagnosis present

## 2015-05-13 DIAGNOSIS — I69359 Hemiplegia and hemiparesis following cerebral infarction affecting unspecified side: Secondary | ICD-10-CM

## 2015-05-13 DIAGNOSIS — I639 Cerebral infarction, unspecified: Secondary | ICD-10-CM

## 2015-05-13 DIAGNOSIS — R1319 Other dysphagia: Secondary | ICD-10-CM | POA: Diagnosis present

## 2015-05-13 DIAGNOSIS — E876 Hypokalemia: Secondary | ICD-10-CM | POA: Diagnosis present

## 2015-05-13 DIAGNOSIS — Z09 Encounter for follow-up examination after completed treatment for conditions other than malignant neoplasm: Secondary | ICD-10-CM

## 2015-05-13 DIAGNOSIS — J690 Pneumonitis due to inhalation of food and vomit: Secondary | ICD-10-CM | POA: Diagnosis present

## 2015-05-13 DIAGNOSIS — I69320 Aphasia following cerebral infarction: Secondary | ICD-10-CM | POA: Diagnosis present

## 2015-05-13 DIAGNOSIS — I69391 Dysphagia following cerebral infarction: Secondary | ICD-10-CM

## 2015-05-13 DIAGNOSIS — R600 Localized edema: Secondary | ICD-10-CM

## 2015-05-13 DIAGNOSIS — R609 Edema, unspecified: Secondary | ICD-10-CM | POA: Diagnosis present

## 2015-05-13 DIAGNOSIS — I82611 Acute embolism and thrombosis of superficial veins of right upper extremity: Secondary | ICD-10-CM | POA: Diagnosis present

## 2015-05-13 DIAGNOSIS — I6522 Occlusion and stenosis of left carotid artery: Secondary | ICD-10-CM

## 2015-05-13 DIAGNOSIS — E870 Hyperosmolality and hypernatremia: Secondary | ICD-10-CM

## 2015-05-13 DIAGNOSIS — Z720 Tobacco use: Secondary | ICD-10-CM | POA: Diagnosis present

## 2015-05-13 DIAGNOSIS — I5032 Chronic diastolic (congestive) heart failure: Secondary | ICD-10-CM | POA: Diagnosis present

## 2015-05-13 LAB — GLUCOSE, CAPILLARY
Glucose-Capillary: 88 mg/dL (ref 65–99)
Glucose-Capillary: 86 mg/dL (ref 65–99)
Glucose-Capillary: 84 mg/dL (ref 65–99)
Glucose-Capillary: 68 mg/dL (ref 65–99)
Glucose-Capillary: 65 mg/dL (ref 65–99)
Glucose-Capillary: 78 mg/dL (ref 65–99)
Glucose-Capillary: 85 mg/dL (ref 65–99)

## 2015-05-13 LAB — BASIC METABOLIC PANEL
Anion gap: 11 (ref 5–15)
BUN: 8 mg/dL (ref 6–20)
CO2: 20 mmol/L — ABNORMAL LOW (ref 22–32)
Calcium: 8.2 mg/dL — ABNORMAL LOW (ref 8.9–10.3)
Chloride: 115 mmol/L — ABNORMAL HIGH (ref 101–111)
Creatinine, Ser: 0.8 mg/dL (ref 0.44–1.00)
GFR calc Af Amer: >60 mL/min (ref >60)
GFR calc non Af Amer: >60 mL/min (ref >60)
Glucose, Bld: 95 mg/dL (ref 65–99)
Potassium: 3.2 mmol/L — ABNORMAL LOW (ref 3.5–5.1)
Sodium: 146 mmol/L — ABNORMAL HIGH (ref 135–145)

## 2015-05-13 LAB — MAGNESIUM: Magnesium: 1.9 mg/dL (ref 1.7–2.4)

## 2015-05-13 LAB — CBC
HCT: 32.6 % — ABNORMAL LOW (ref 36.0–46.0)
Hemoglobin: 10.6 g/dL — ABNORMAL LOW (ref 12.0–15.0)
MCH: 30.8 pg (ref 26.0–34.0)
MCHC: 32.5 g/dL (ref 30.0–36.0)
MCV: 94.8 fL (ref 78.0–100.0)
Platelets: 188 10*3/uL (ref 150–400)
RBC: 3.44 MIL/uL — ABNORMAL LOW (ref 3.87–5.11)
RDW: 14.3 % (ref 11.5–15.5)
WBC: 9.2 10*3/uL (ref 4.0–10.5)

## 2015-05-13 LAB — PHOSPHORUS: Phosphorus: 3.1 mg/dL (ref 2.5–4.6)

## 2015-05-13 MED ORDER — CLONIDINE HCL 0.1 MG PO TABS
0.1000 mg | ORAL_TABLET | Freq: Four times a day (QID) | ORAL | Status: DC | PRN
Start: 2015-05-13 — End: 2015-05-21

## 2015-05-13 MED ORDER — IPRATROPIUM-ALBUTEROL 0.5-2.5 (3) MG/3ML IN SOLN
3.0000 mL | RESPIRATORY_TRACT | Status: DC | PRN
  Administered 2015-05-13 (×2): 3 mL via RESPIRATORY_TRACT
  Filled 2015-05-13 (×2): qty 3

## 2015-05-13 MED ORDER — BISACODYL 10 MG RE SUPP: 10.0000 mg | Freq: Every day | RECTAL | Status: DC | PRN

## 2015-05-13 MED ORDER — PROCHLORPERAZINE EDISYLATE 5 MG/ML IJ SOLN: 5.0000 mg | Freq: Four times a day (QID) | INTRAMUSCULAR | Status: DC | PRN

## 2015-05-13 MED ORDER — ASPIRIN 325 MG PO TABS: 325.0000 mg | ORAL_TABLET | Freq: Every day | ORAL | Status: DC

## 2015-05-13 MED ORDER — GUAIFENESIN-DM 100-10 MG/5ML PO SYRP: 5.0000 mL | ORAL_SOLUTION | Freq: Four times a day (QID) | ORAL | Status: DC | PRN

## 2015-05-13 MED ORDER — CLOPIDOGREL BISULFATE 75 MG PO TABS: 75.0000 mg | ORAL_TABLET | Freq: Every day | ORAL | Status: DC

## 2015-05-13 MED ORDER — ANTISEPTIC ORAL RINSE SOLUTION (CORINZ)
7.0000 mL | Freq: Four times a day (QID) | OROMUCOSAL | Status: DC
  Administered 2015-05-14 – 2015-05-19 (×6): 7 mL via OROMUCOSAL

## 2015-05-13 MED ORDER — ALUM & MAG HYDROXIDE-SIMETH 200-200-20 MG/5ML PO SUSP
30.0000 mL | ORAL | Status: DC | PRN
Start: 2015-05-13 — End: 2015-05-21

## 2015-05-13 MED ORDER — FLEET ENEMA 7-19 GM/118ML RE ENEM: 1.0000 | ENEMA | Freq: Once | RECTAL | Status: DC | PRN

## 2015-05-13 MED ORDER — ACETAMINOPHEN 325 MG PO TABS: 325.0000 mg | ORAL_TABLET | ORAL | Status: DC | PRN

## 2015-05-13 MED ORDER — IPRATROPIUM-ALBUTEROL 0.5-2.5 (3) MG/3ML IN SOLN: 3.0000 mL | RESPIRATORY_TRACT | Status: DC | PRN

## 2015-05-13 MED ORDER — SACCHAROMYCES BOULARDII 250 MG PO CAPS
250.0000 mg | ORAL_CAPSULE | Freq: Two times a day (BID) | ORAL | Status: DC
  Administered 2015-05-13 – 2015-05-21 (×16): 250 mg via ORAL
  Filled 2015-05-13 (×16): qty 1

## 2015-05-13 MED ORDER — CLOPIDOGREL BISULFATE 75 MG PO TABS
75.0000 mg | ORAL_TABLET | Freq: Every day | ORAL | Status: DC
  Administered 2015-05-14 – 2015-05-21 (×8): 75 mg via ORAL
  Filled 2015-05-13 (×8): qty 1

## 2015-05-13 MED ORDER — RESOURCE THICKENUP CLEAR PO POWD
Freq: Once | ORAL | Status: DC
  Filled 2015-05-13: qty 125

## 2015-05-13 MED ORDER — PANTOPRAZOLE SODIUM 20 MG PO TBEC
20.0000 mg | DELAYED_RELEASE_TABLET | Freq: Every day | ORAL | Status: DC
  Administered 2015-05-13: 20 mg via ORAL
  Filled 2015-05-13 (×2): qty 1

## 2015-05-13 MED ORDER — TRAZODONE HCL 50 MG PO TABS: 25.0000 mg | ORAL_TABLET | Freq: Every evening | ORAL | Status: DC | PRN

## 2015-05-13 MED ORDER — SENNOSIDES-DOCUSATE SODIUM 8.6-50 MG PO TABS: 1.0000 | ORAL_TABLET | Freq: Every evening | ORAL | Status: DC | PRN

## 2015-05-13 MED ORDER — PROCHLORPERAZINE MALEATE 5 MG PO TABS: 5.0000 mg | ORAL_TABLET | Freq: Four times a day (QID) | ORAL | Status: DC | PRN

## 2015-05-13 MED ORDER — ONDANSETRON HCL 4 MG/2ML IJ SOLN: 4.0000 mg | Freq: Four times a day (QID) | INTRAMUSCULAR | Status: DC | PRN

## 2015-05-13 MED ORDER — AMPICILLIN-SULBACTAM SODIUM 3 (2-1) G IJ SOLR
3.0000 g | Freq: Four times a day (QID) | INTRAMUSCULAR | Status: DC
  Administered 2015-05-13 – 2015-05-14 (×2): 3 g via INTRAVENOUS
  Filled 2015-05-13 (×5): qty 3

## 2015-05-13 MED ORDER — ASPIRIN 325 MG PO TABS
325.0000 mg | ORAL_TABLET | Freq: Every day | ORAL | Status: DC
  Administered 2015-05-14 – 2015-05-21 (×8): 325 mg via ORAL
  Filled 2015-05-13 (×8): qty 1

## 2015-05-13 MED ORDER — SODIUM CHLORIDE 0.9 % IV SOLN: 3.0000 g | Freq: Four times a day (QID) | INTRAVENOUS | Status: DC

## 2015-05-13 MED ORDER — POTASSIUM CHLORIDE CRYS ER 20 MEQ PO TBCR
40.0000 meq | EXTENDED_RELEASE_TABLET | Freq: Once | ORAL | Status: AC
  Administered 2015-05-13: 40 meq via ORAL
  Filled 2015-05-13: qty 2

## 2015-05-13 MED ORDER — CHLORHEXIDINE GLUCONATE 0.12% ORAL RINSE (MEDLINE KIT)
15.0000 mL | Freq: Two times a day (BID) | OROMUCOSAL | Status: DC
  Administered 2015-05-13 – 2015-05-17 (×8): 15 mL via OROMUCOSAL

## 2015-05-13 MED ORDER — PROCHLORPERAZINE 25 MG RE SUPP: 12.5000 mg | Freq: Four times a day (QID) | RECTAL | Status: DC | PRN

## 2015-05-13 NOTE — Progress Notes (Signed)
Monica Blake, MD Physician Signed Physical Medicine and Rehabilitation Consult Note 05/13/2015 8:40 AM  Related encounter: ED to Hosp-Admission (Discharged) from 05/09/2015 in [place] [facility]    Expand All Collapse All        Physical Medicine and Rehabilitation Consult   Reason for Consult: Right sided weakness, Speech difficulty,  Referring Physician: Dr. Leonie Man   HPI: Monica Pugh is a 65 y.o. female in relatively good health who was admitted on 05/09/15 with left facial droop, difficulty handling secretions, difficulty speaking and unresponsiveness. CT head negative and she was transferred to [facility] for treatment. She was treated with IV tPA and intubated for airway protection. 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 Right ICA remained occluded and left ICA now patent. 2D echo with EF 55-60% with grade 1 diastolic dysfunction and mild LAE. She had difficulty with wean wean with vomiting and possible RML aspiration PNA. She was started on Unasyn for treatment and tolerated extubation on 03/20.  Patient on ASA/Plavix for secondary stroke prevention. 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 Therapy evaluations done yesterday and patient limited by right hemiparesis, apraxia, question of visual deficits and difficulty following commands. 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 CIR recommended for follow up therapy.   Discussed with interventional radiology, we'll be needing follow-up 6 weeks from the time of  intra-arterial lysis of thrombus Discussed with speech therapy apraxia greater than aphasia noted. Patient has intermittent cough which does not appear to be related to meal times Review of Systems  Unable to perform ROS: language  HENT: Negative for hearing loss.  Eyes: Positive for blurred vision (had issues with one of her eyes PTA).  Respiratory: Positive for cough and shortness of breath.   Family reports sleep apnea symptoms--no treatment.  Gastrointestinal: Positive for heartburn.  Neurological: Negative for headaches.  Psychiatric/Behavioral: The patient has insomnia.      History reviewed. No pertinent past medical history.    Past Surgical History  Procedure Laterality Date  . Tubal ligation    . Radiology with anesthesia N/A 05/09/2015    Procedure: RADIOLOGY WITH ANESTHESIA; Surgeon: Luanne Bras, MD; Location: [place]; Service: Radiology; Laterality: N/A;    Family History  Problem Relation Age of Onset  . Cancer Mother   . Heart attack Father       Social History: Married. Was laid off about 18 months ago. Husband retired and can assist after discharge?. Per reports that she has been smoking Cigarettes--1 1/2 PPD. She has a 60 pack-year smoking history. She does not have any smokeless tobacco history on file. Her alcohol and drug histories are not on file.   Allergies: No Known Allergies    Medications Prior to Admission  Medication Sig Dispense Refill  . acetaminophen (TYLENOL) 325 MG tablet Take 650 mg by mouth every 6 (six) hours as needed.    . pantoprazole (PROTONIX) 40 MG tablet Take 40 mg by mouth 2 (two) times daily.  2    Home: Home Living Family/patient expects to be discharged to:: Private residence Living Arrangements: Spouse/significant other Available Help at Discharge:  Family, Available 24 hours/day Type of Home: House Home Access: Level entry Home Layout: One  level Bathroom Shower/Tub: Tub/shower unit, Architectural technologist: Standard Home Equipment: None Additional Comments: Pt retired from Engineer, maintenance type job  Lives With: Spouse  Functional History: Prior Function Level of Independence: Independent Functional Status:  Mobility: Bed Mobility Overal bed mobility: Needs Assistance Bed Mobility: Supine to Sit Supine to sit: Min assist General bed mobility comments: requires assist to initiate activity and assist for her balance  Transfers Overall transfer level: Needs assistance Equipment used: 2 person hand held assist Transfers: Sit to/from Stand, Stand Pivot Transfers Sit to Stand: Min assist, +2 physical assistance Stand pivot transfers: Min assist, +2 physical assistance General transfer comment: Pt with buckling of Rt knee, but she was able to control adequately to prevent loss of control. Pt slow to initiate movement on Rt.  Ambulation/Gait General Gait Details: not assessed    ADL: ADL Overall ADL's : Needs assistance/impaired Eating/Feeding: Moderate assistance, Sitting Grooming: Wash/dry hands, Wash/dry face, Brushing hair, Moderate assistance, Sitting Upper Body Bathing: Maximal assistance, Sitting Lower Body Bathing: Maximal assistance, Sit to/from stand Upper Body Dressing : Maximal assistance Lower Body Dressing: Sit to/from stand, Total assistance Toilet Transfer: Minimal assistance, +2 for physical assistance, BSC, Stand-pivot Toileting- Clothing Manipulation and Hygiene: Maximal assistance, Sit to/from stand Toileting - Clothing Manipulation Details (indicate cue type and reason): Pt incontinent of stool. Assisted with peri care in standing  Functional mobility during ADLs: Minimal assistance, +2 for physical assistance General ADL Comments: Pt is very motivated. family present   Cognition: Cognition Overall Cognitive Status: Impaired/Different from baseline Arousal/Alertness:  Awake/alert Orientation Level: Oriented to person, Oriented to place, Oriented to situation Attention: Focused Focused Attention: Impaired Focused Attention Impairment: Verbal basic, Functional basic Safety/Judgment: Impaired Cognition Arousal/Alertness: Awake/alert Behavior During Therapy: Flat affect Overall Cognitive Status: Impaired/Different from baseline Area of Impairment: Attention, Following commands, Problem solving Current Attention Level: Sustained Following Commands: Follows one step commands with increased time, Follows one step commands consistently Problem Solving: Slow processing, Decreased initiation, Requires verbal cues, Requires tactile cues General Comments: Pt very slow to initiate movement    Blood pressure 153/60, pulse 62, temperature 99 F (37.2 C), temperature source Oral, resp. rate 22, height 5\' 6"  (1.676 m), weight 84.9 kg (187 lb 2.7 oz), SpO2 98 %. Physical Exam  Nursing note and vitals reviewed. Constitutional: She appears well-developed and well-nourished.  Slumped in bed and being fed by husband. Frequent coughing noted.  HENT:  Head: Normocephalic and atraumatic.  Eyes: Conjunctivae are normal. Pupils are equal, round, and reactive to light.  Neck: Normal range of motion. Neck supple.  Cardiovascular: Normal rate and regular rhythm.  No murmur heard. Respiratory: Effort normal. She has rales in the right upper field. She exhibits no tenderness.  Congested cough noted.  GI: Bowel sounds are normal. There is no tenderness. There is no rebound.  Musculoskeletal: She exhibits no tenderness.  Min edema right hand.  Neurological: She is alert.  Flat affect. Did not make eye contact and looked straight ahead--question peripheral visual field deficits. Unable to follow simple motor commands, point to items or verbalize without max cues. She was able to name her husband and son but very distracted needing redirection. Moves left side better than  right--apraxic.   Skin: Skin is warm and dry.  Psychiatric: Her affect is inappropriate. Her speech is delayed. She is slowed. Cognition and memory are impaired. She is noncommunicative. She is inattentive.  Right arm  swelling, discussed with RN, this has been dependent edema no history of an filtration. Motor strength is 3 minus in the right deltoid, bicep, tricep, thick finger flexors and extensors. Limited range of motion and some degree due to swelling in the hand and wrist. Right lower extremity 3 minus in the hip flexor name extensor as well as ankle dorsiflexor Left side is 5/5 in the deltoid, biceps, triceps, grip, 4+ in the hip flexor and extensor ankle dorsiflexor Speech is able to name simple objects with delay. She needs gestural cues to follow certain commands   Lab Results Last 24 Hours    Results for orders placed or performed during the hospital encounter of 05/09/15 (from the past 24 hour(s))  Glucose, capillary Status: Abnormal   Collection Time: 05/12/15 11:21 AM  Result Value Ref Range   Glucose-Capillary 109 (H) 65 - 99 mg/dL   Comment 1 Notify RN    Comment 2 Document in Chart   Glucose, capillary Status: None   Collection Time: 05/12/15 3:20 PM  Result Value Ref Range   Glucose-Capillary 66 65 - 99 mg/dL   Comment 1 Notify RN    Comment 2 Document in Chart   Glucose, capillary Status: None   Collection Time: 05/12/15 3:55 PM  Result Value Ref Range   Glucose-Capillary 71 65 - 99 mg/dL   Comment 1 Notify RN    Comment 2 Document in Chart   Glucose, capillary Status: None   Collection Time: 05/12/15 7:37 PM  Result Value Ref Range   Glucose-Capillary 91 65 - 99 mg/dL  Glucose, capillary Status: None   Collection Time: 05/12/15 11:07 PM  Result Value Ref Range   Glucose-Capillary 73 65 - 99 mg/dL  Glucose, capillary Status: None   Collection Time:  05/13/15 4:12 AM  Result Value Ref Range   Glucose-Capillary 88 65 - 99 mg/dL  Basic metabolic panel Status: Abnormal   Collection Time: 05/13/15 5:34 AM  Result Value Ref Range   Sodium 146 (H) 135 - 145 mmol/L   Potassium 3.2 (L) 3.5 - 5.1 mmol/L   Chloride 115 (H) 101 - 111 mmol/L   CO2 20 (L) 22 - 32 mmol/L   Glucose, Bld 95 65 - 99 mg/dL   BUN 8 6 - 20 mg/dL   Creatinine, Ser 0.80 0.44 - 1.00 mg/dL   Calcium 8.2 (L) 8.9 - 10.3 mg/dL   GFR calc non Af Amer >60 >60 mL/min   GFR calc Af Amer >60 >60 mL/min   Anion gap 11 5 - 15  CBC Status: Abnormal   Collection Time: 05/13/15 5:34 AM  Result Value Ref Range   WBC 9.2 4.0 - 10.5 K/uL   RBC 3.44 (L) 3.87 - 5.11 MIL/uL   Hemoglobin 10.6 (L) 12.0 - 15.0 g/dL   HCT 32.6 (L) 36.0 - 46.0 %   MCV 94.8 78.0 - 100.0 fL   MCH 30.8 26.0 - 34.0 pg   MCHC 32.5 30.0 - 36.0 g/dL   RDW 14.3 11.5 - 15.5 %   Platelets 188 150 - 400 K/uL  Magnesium Status: None   Collection Time: 05/13/15 5:34 AM  Result Value Ref Range   Magnesium 1.9 1.7 - 2.4 mg/dL  Phosphorus Status: None   Collection Time: 05/13/15 5:34 AM  Result Value Ref Range   Phosphorus 3.1 2.5 - 4.6 mg/dL  Glucose, capillary Status: None   Collection Time: 05/13/15 8:01 AM  Result Value Ref Range   Glucose-Capillary 86 65 - 99  mg/dL   Comment 1 Notify RN    Comment 2 Document in Chart       Imaging Results (Last 48 hours)    Ct Head Wo Contrast  05/11/2015 CLINICAL DATA: Continued surveillance intracranial hemorrhage and cerebral infarction. EXAM: CT HEAD WITHOUT CONTRAST TECHNIQUE: Contiguous axial images were obtained from the base of the skull through the vertex without intravenous contrast. COMPARISON: Multiple priors. Most recent CT 05/09/2015. Most recent MR 05/10/2015. FINDINGS: Nonhemorrhagic cerebral  infarction most notable in the LEFT lentiform nucleus. Secondary involvement of the LEFT caudate, and surrounding white matter. Hemorrhagic infarct affects the LEFT medial frontal lobe in conjunction with diffuse subarachnoid hemorrhage. The subarachnoid hemorrhage has normalized slightly in comparison with 3/18, but the cytotoxic edema in the LEFT basal ganglia is worse. The area of cytotoxic edema within the hemorrhagic LEFT ACA territory infarct is also worse. No new areas of infarction are seen. Calvarium intact. IMPRESSION: Improving subarachnoid hemorrhage. Progression of cytotoxic edema in the infarcted tissue of the LEFT hemisphere is described. No new areas of involvement. Electronically Signed By: Staci Righter M.D. 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 Does the need for close, 24 hr/day medical supervision in concert with the patient's rehab needs make it unreasonable for this patient to be served in a less intensive setting? Yes 2. Co-Morbidities requiring supervision/potential complications: Dysphagia, incontinence 3. Due to bladder management, bowel management, safety, skin/wound care, disease management, medication administration, pain management and patient education, does the patient require 24 hr/day rehab nursing? Yes 4. Does the patient require coordinated care of a physician, rehab nurse, PT (1-2 hrs/day, 55 days/week), OT (1-2 hrs/day, 5 days/week) and SLP (0.5-1 hrs/day, 5 days/week) to address physical and functional deficits in the context of the above medical diagnosis(es)? Yes Addressing deficits in the following areas: balance, endurance, locomotion, strength, transferring, bowel/bladder control, bathing, dressing, feeding, grooming, toileting, cognition, speech, language, swallowing and psychosocial  support 5. Can the patient actively participate in an intensive therapy program of at least 3 hrs of therapy per day at least 5 days per week? Yes 6. The potential for patient to make measurable gains while on inpatient rehab is good 7. Anticipated functional outcomes upon discharge from inpatient rehab are min assist with PT, min assist with OT, min assist with SLP. 8. Estimated rehab length of stay to reach the above functional goals is: 18-21 days 9. Does the patient have adequate social supports and living environment to accommodate these discharge functional goals? Yes 10. Anticipated D/C setting: Home 11. Anticipated post D/C treatments: [place] therapy 12. Overall Rehab/Functional Prognosis: good  RECOMMENDATIONS: This patient's condition is appropriate for continued rehabilitative care in the following setting: CIR Patient has agreed to participate in recommended program. Potentially Note that insurance prior authorization may be required for reimbursement for recommended care.  Comment:     05/13/2015       Revision History     Date/Time User Provider Type Action   05/13/2015 10:32 AM Monica Blake, MD Physician Sign   05/13/2015 9:25 AM Bary Leriche, PA-C Physician Assistant Share   View Details Report

## 2015-05-13 NOTE — Progress Notes (Signed)
PULMONARY / CRITICAL CARE MEDICINE   Name: Monica Pugh MRN: [id number] DOB: 1950-03-24    ADMISSION DATE:  05/09/2015 CONSULTATION DATE:  05/09/15  REFERRING MD:  Silverio Decamp  CHIEF COMPLAINT:  VDRF  HISTORY OF PRESENT ILLNESS:   65 yo woman, very little available currently about her hx. She was last seen normal around 10:00, was then found aphasic, unresponsive with facial droop and difficulty managing secretions by husband at 11:15. Was taken to  ED and then tx to [facility]. t-PA was started at 12:05. She was intubated in ED for airway protection, went to IR for angio and clot retrieval.   SUBJECTIVE: No acute events overnight. Patient denies any chest pain or pressure. She denies any difficulty breathing or cough.  REVIEW OF SYSTEMS: No nausea, vomiting, or abdominal pain. No headache.  VITAL SIGNS: BP 153/60 mmHg  Pulse 62  Temp(Src) 99 F (37.2 C) (Oral)  Resp 22  Ht 5\' 6"  (1.676 m)  Wt 84.9 kg (187 lb 2.7 oz)  BMI 30.22 kg/m2  SpO2 98%  HEMODYNAMICS:    VENTILATOR SETTINGS:    INTAKE / OUTPUT: I/O last 3 completed shifts: In: 4679.6 [I.V.:3779.6; IV Piggyback:900] Out: 1160 [id number]  PHYSICAL EXAMINATION: General:  Awake. No acute distress. Husband at bedside.  Integument:  Warm & dry. No rash on exposed skin.  HEENT:  Moist mucus membranes. No oral ulcers. No scleral injection or icterus.  Cardiovascular:  Regular rate. Mild anasarca. No appreciable JVD.  Pulmonary:  Good aeration & clear to auscultation bilaterally. Normal work of breathing on room air. Abdomen: Soft. Normal bowel sounds. Protuberant. Grossly nontender. Neurological: Patient following commands and moving all 4 extremities. Oriented to year, place, and person.  LABS:  BMET  Recent Labs Lab 05/09/15 1152 05/09/15 1201 05/10/15 0525 05/13/15 0534  NA 140 140 138 146*  K 4.3 4.2 3.6 3.2*  CL 104 103 110 115*  CO2 25  --  20* 20*  BUN 9 11 6 8   CREATININE 0.85 0.90 0.85 0.80   GLUCOSE 84 79 111* 95    Electrolytes  Recent Labs Lab 05/09/15 1152 05/10/15 0525 05/13/15 0534  CALCIUM 9.3 7.4* 8.2*  MG  --  1.4* 1.9  PHOS  --  3.7 3.1    CBC  Recent Labs Lab 05/09/15 1152 05/09/15 1201 05/10/15 0525 05/13/15 0534  WBC 9.1  --  12.7* 9.2  HGB 15.7* 17.7* 12.2 10.6*  HCT 47.1* 52.0* 36.3 32.6*  PLT 244  --  217 188    Coag's  Recent Labs Lab 05/09/15 1218  APTT 28  INR 1.08    Sepsis Markers No results for input(s): LATICACIDVEN, PROCALCITON, O2SATVEN in the last 168 hours.  ABG  Recent Labs Lab 05/09/15 1801  PHART 7.311*  PCO2ART 40.6  PO2ART 95.0    Liver Enzymes  Recent Labs Lab 05/09/15 1152 05/10/15 0525  AST 20  --   ALT 14  --   ALKPHOS 75  --   BILITOT 0.8  --   ALBUMIN 3.8 2.7*    Cardiac Enzymes No results for input(s): TROPONINI, PROBNP in the last 168 hours.  Glucose  Recent Labs Lab 05/12/15 1520 05/12/15 1555 05/12/15 1937 05/12/15 2307 05/13/15 0412 05/13/15 0801  GLUCAP 66 71 91 73 88 86    Imaging No results found.   STUDIES:  Head Ct 3/18 >> probable early L MCA infarct CT angio Head and neck 3/18 >> acute thrombus L M2 segment, occlusion L ICA, prox stenosis  innominate, chronic occlusion R ICA with collaterals Brain MRI 3/19 >> Subarachnoid hemorrhage on the left. Left frontal hematoma likely related to hemorrhagic infarction. Acute infarct in the left caudate and putamen. Scattered small areas of acute infarct. RICA occluded Echo 3/19 >> nml LV fn L Carotid Duplex: 50-75% stenosis L proximal ICA & >50% L ECA stenosis  MICROBIOLOGY: Tracheal Asp Ctx 3/18:  Normal oral flora MRSA PCR 3/18:  Negative  ANTIBIOTICS: Unasyn 3/18 >>   SIGNIFICANT EVENTS: 3/18 - S/p IR for clot retrieval   LINES/TUBES: L Femoral CVL 3/19>>> Foley 3/19>>> PIV x1 ETT 3/18 - 3/21  ASSESSMENT / PLAN:  PULMONARY A: Acute Hypoxic Respiratory Failure - Due to neuro status. Extubated  3/21. Possible Aspiration Pneumonia - RML. Heavy smoker - Probable COPD  P:   Duoneb q4hr prn Incentive spirometry  CARDIOVASCULAR A:  Hypotension - Secondary to sedation. Previously arterial line was much higher than cuff. Off phenylephrine as of 8pm 3/21. Echo - nml LV fn  P:  Goal SBP 110-140 Monitor on telemetry Vitals per unit protocol  RENAL A:   Hypokalemia - Mild. Hypernatremia - Mild. Non-AG acidosis Hypomagnesemia - Resolved.  P:   KCl 32mEq PO x1 Monitoring electrolytes & renal function daily Trending UOP with foley  GASTROINTESTINAL A:   Moderate aspiration risk.  P:   Dysphagia diet Protonix po daily  HEMATOLOGIC A:   S/p t-PA Anemia - Mild. No signs of active bleeding.  P:  Trending cell counts daily w/ CBC SCDs  INFECTIOUS A:   Probable Aspiration Pneumonia  P:   Day #5/7 Empiric Unasyn Plan to re-culture for fever  ENDOCRINE A:   Hyperglycemia - BG stable.   P:   D/C accu-checks Monitor BG on daily labs  NEUROLOGIC A:   Acute L ICA CVA - s/p t-PA and IR retrieval  P:   Management & Tx per Neurology ASA & Plavix  FAMILY  - Updates: husband 3/22  - Inter-disciplinary family meet or Palliative Care meeting due by: NA   TODAY'S SUMMARY:  65 yo woman, admitted with acute L temporal CVA, s/p t-PA and then IR retrieval clot L ICA. Patient has been off of vasopressor infusion since the evening of 3/21. Nurse reports plan to transfer patient out of the intensive care unit today. Plan to discontinue central venous line today and establish additional IV access. PCCM signing off.  Sonia Baller Ashok Cordia, M.D. [facility] Pager:  [phone] After 3pm or if no response, call [phone]  05/13/2015, 8:56 AM

## 2015-05-13 NOTE — Consult Note (Signed)
Physical Medicine and Rehabilitation Consult   Reason for Consult: Right sided weakness, Speech difficulty,  Referring Physician: Dr. Leonie Man   HPI: Monica Pugh is a 65 y.o. female in relatively good health who was admitted on 05/09/15 with left facial droop, difficulty handling secretions, difficulty speaking and unresponsiveness. CT head negative and she was transferred to [facility] for treatment. She was treated with IV tPA and intubated for airway protection.  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 Right ICA remained occluded and left ICA now patent. 2D echo with EF 55-60% with grade 1 diastolic dysfunction and mild LAE. She had difficulty with wean wean with vomiting and possible RML aspiration PNA. She was started on Unasyn for treatment and tolerated extubation on 03/20.  Patient on ASA/Plavix for secondary stroke prevention. 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  Therapy evaluations done yesterday and patient limited by right hemiparesis, apraxia, question of visual deficits and difficulty following commands.  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 CIR recommended for follow up therapy.    Discussed with interventional radiology, we'll be needing follow-up 6 weeks from the time of intra-arterial lysis of thrombus Discussed with speech therapy apraxia greater than  aphasia noted. Patient has intermittent cough which does not appear to be related to meal times Review of Systems  Unable to perform ROS: language  HENT: Negative for hearing loss.     Eyes: Positive for blurred vision (had issues with one of her eyes PTA).  Respiratory: Positive for cough and shortness of breath.        Family reports sleep apnea symptoms--no treatment.   Gastrointestinal: Positive for heartburn.  Neurological: Negative for headaches.  Psychiatric/Behavioral: The patient has insomnia.       History reviewed. No pertinent past medical history.    Past Surgical History  Procedure Laterality Date  . Tubal ligation    . Radiology with anesthesia N/A 05/09/2015    Procedure: RADIOLOGY WITH ANESTHESIA;  Surgeon: Luanne Bras, MD;  Location: [place];  Service: Radiology;  Laterality: N/A;    Family History  Problem Relation Age of Onset  . Cancer Mother   . Heart attack Father       Social History:  Married. Was laid off about  18 months ago. Husband retired and can assist after discharge?. Per  reports that she has been smoking Cigarettes--1 1/2 PPD.  She has a 60 pack-year smoking history. She does not have any smokeless tobacco history on file. Her alcohol and drug histories are not on file.   Allergies: No Known Allergies    Medications Prior to Admission  Medication Sig Dispense Refill  . acetaminophen (TYLENOL) 325 MG tablet Take 650 mg by mouth every 6 (six) hours as needed.    . pantoprazole (PROTONIX) 40 MG tablet Take 40 mg by mouth 2 (two) times daily.  2    Home: Home Living Family/patient expects to be discharged to:: Private residence Living Arrangements: Spouse/significant other Available Help at Discharge: Family, Available 24 hours/day Type of Home: House Home Access: Level entry [place]: One level Bathroom Shower/Tub: Tub/shower unit, Curtain  Bathroom Toilet: Standard Home Equipment: None Additional Comments: Pt retired from Engineer, maintenance type job   Lives With: Spouse  Functional History: Prior Function Level of Independence: Independent Functional Status:  Mobility: Bed Mobility Overal bed mobility:  Needs Assistance Bed Mobility: Supine to Sit Supine to sit: Min assist General bed mobility comments: requires assist to initiate activity and assist for her balance  Transfers Overall transfer level: Needs assistance Equipment used: 2 person hand held assist Transfers: Sit to/from Stand, Stand Pivot Transfers Sit to Stand: Min assist, +2 physical assistance Stand pivot transfers: Min assist, +2 physical assistance General transfer comment: Pt with buckling of Rt knee, but she was able to control adequately to prevent loss of control.  Pt slow to initiate movement on Rt.  Ambulation/Gait General Gait Details: not assessed    ADL: ADL Overall ADL's : Needs assistance/impaired Eating/Feeding: Moderate assistance, Sitting Grooming: Wash/dry hands, Wash/dry face, Brushing hair, Moderate assistance, Sitting Upper Body Bathing: Maximal assistance, Sitting Lower Body Bathing: Maximal assistance, Sit to/from stand Upper Body Dressing : Maximal assistance Lower Body Dressing: Sit to/from stand, Total assistance Toilet Transfer: Minimal assistance, +2 for physical assistance, BSC, Stand-pivot Toileting- Clothing Manipulation and Hygiene: Maximal assistance, Sit to/from stand Toileting - Clothing Manipulation Details (indicate cue type and reason): Pt incontinent of stool.  Assisted with peri care in standing  Functional mobility during ADLs: Minimal assistance, +2 for physical assistance General ADL Comments: Pt is very motivated.  family present   Cognition: Cognition Overall Cognitive Status: Impaired/Different from baseline Arousal/Alertness: Awake/alert Orientation Level: Oriented to person, Oriented to place, Oriented to situation Attention: Focused Focused Attention: Impaired Focused Attention Impairment: Verbal basic, Functional basic Safety/Judgment: Impaired Cognition Arousal/Alertness: Awake/alert Behavior During Therapy: Flat affect Overall Cognitive Status:  Impaired/Different from baseline Area of Impairment: Attention, Following commands, Problem solving Current Attention Level: Sustained Following Commands: Follows one step commands with increased time, Follows one step commands consistently Problem Solving: Slow processing, Decreased initiation, Requires verbal cues, Requires tactile cues General Comments: Pt very slow to initiate movement    Blood pressure 153/60, pulse 62, temperature 99 F (37.2 C), temperature source Oral, resp. rate 22, height 5\' 6"  (1.676 m), weight 84.9 kg (187 lb 2.7 oz), SpO2 98 %. Physical Exam  Nursing note and vitals reviewed. Constitutional: She appears well-developed and well-nourished.  Slumped in bed and being fed by husband. Frequent coughing noted.  HENT:  Head: Normocephalic and atraumatic.  Eyes: Conjunctivae are normal. Pupils are equal, round, and reactive to light.  Neck: Normal range of motion. Neck supple.  Cardiovascular: Normal rate and regular rhythm.   No murmur heard. Respiratory: Effort normal. She has rales in the right upper field. She exhibits no tenderness.  Congested cough noted.    GI: Bowel sounds are normal. There is no tenderness. There is no rebound.  Musculoskeletal: She exhibits no tenderness.  Min edema right hand.   Neurological: She is alert.  Flat affect. Did not make eye contact and looked straight ahead--question peripheral visual field deficits.  Unable to follow simple motor commands, point to items or verbalize without max cues.  She was able to name her husband and son but very distracted needing redirection. Moves left side better than right--apraxic.    Skin: Skin is warm and dry.  Psychiatric: Her affect is inappropriate. Her speech is delayed. She is slowed. Cognition and memory are impaired. She is noncommunicative. She is inattentive.  Right arm swelling, discussed with RN, this has been dependent edema no history  of an filtration. Motor strength is 3 minus in  the right deltoid, bicep, tricep, thick finger flexors and extensors. Limited range of motion and some degree due to swelling in the hand and wrist. Right lower extremity 3 minus in the hip flexor name extensor as well as ankle dorsiflexor Left side is 5/5 in the deltoid, biceps, triceps, grip, 4+ in the hip flexor and extensor ankle dorsiflexor Speech is able to name simple objects with delay. She needs gestural cues to follow certain commands  Results for orders placed or performed during the hospital encounter of 05/09/15 (from the past 24 hour(s))  Glucose, capillary     Status: Abnormal   Collection Time: 05/12/15 11:21 AM  Result Value Ref Range   Glucose-Capillary 109 (H) 65 - 99 mg/dL   Comment 1 Notify RN    Comment 2 Document in Chart   Glucose, capillary     Status: None   Collection Time: 05/12/15  3:20 PM  Result Value Ref Range   Glucose-Capillary 66 65 - 99 mg/dL   Comment 1 Notify RN    Comment 2 Document in Chart   Glucose, capillary     Status: None   Collection Time: 05/12/15  3:55 PM  Result Value Ref Range   Glucose-Capillary 71 65 - 99 mg/dL   Comment 1 Notify RN    Comment 2 Document in Chart   Glucose, capillary     Status: None   Collection Time: 05/12/15  7:37 PM  Result Value Ref Range   Glucose-Capillary 91 65 - 99 mg/dL  Glucose, capillary     Status: None   Collection Time: 05/12/15 11:07 PM  Result Value Ref Range   Glucose-Capillary 73 65 - 99 mg/dL  Glucose, capillary     Status: None   Collection Time: 05/13/15  4:12 AM  Result Value Ref Range   Glucose-Capillary 88 65 - 99 mg/dL  Basic metabolic panel     Status: Abnormal   Collection Time: 05/13/15  5:34 AM  Result Value Ref Range   Sodium 146 (H) 135 - 145 mmol/L   Potassium 3.2 (L) 3.5 - 5.1 mmol/L   Chloride 115 (H) 101 - 111 mmol/L   CO2 20 (L) 22 - 32 mmol/L   Glucose, Bld 95 65 - 99 mg/dL   BUN 8 6 - 20 mg/dL   Creatinine, Ser 0.80 0.44 - 1.00 mg/dL   Calcium 8.2 (L) 8.9 - 10.3  mg/dL   GFR calc non Af Amer >60 >60 mL/min   GFR calc Af Amer >60 >60 mL/min   Anion gap 11 5 - 15  CBC     Status: Abnormal   Collection Time: 05/13/15  5:34 AM  Result Value Ref Range   WBC 9.2 4.0 - 10.5 K/uL   RBC 3.44 (L) 3.87 - 5.11 MIL/uL   Hemoglobin 10.6 (L) 12.0 - 15.0 g/dL   HCT 32.6 (L) 36.0 - 46.0 %   MCV 94.8 78.0 - 100.0 fL   MCH 30.8 26.0 - 34.0 pg   MCHC 32.5 30.0 - 36.0 g/dL   RDW 14.3 11.5 - 15.5 %   Platelets 188 150 - 400 K/uL  Magnesium     Status: None   Collection Time: 05/13/15  5:34 AM  Result Value Ref Range   Magnesium 1.9 1.7 - 2.4 mg/dL  Phosphorus     Status: None   Collection Time: 05/13/15  5:34 AM  Result Value Ref Range   Phosphorus  3.1 2.5 - 4.6 mg/dL  Glucose, capillary     Status: None   Collection Time: 05/13/15  8:01 AM  Result Value Ref Range   Glucose-Capillary 86 65 - 99 mg/dL   Comment 1 Notify RN    Comment 2 Document in Chart    Ct Head Wo Contrast  05/11/2015  CLINICAL DATA:  Continued surveillance intracranial hemorrhage and cerebral infarction. EXAM: CT HEAD WITHOUT CONTRAST TECHNIQUE: Contiguous axial images were obtained from the base of the skull through the vertex without intravenous contrast. COMPARISON:  Multiple priors. Most recent CT 05/09/2015. Most recent MR 05/10/2015. FINDINGS: Nonhemorrhagic cerebral infarction most notable in the LEFT lentiform nucleus. Secondary involvement of the LEFT caudate, and surrounding white matter. Hemorrhagic infarct affects the LEFT medial frontal lobe in conjunction with diffuse subarachnoid hemorrhage. The subarachnoid hemorrhage has normalized slightly in comparison with 3/18, but the cytotoxic edema in the LEFT basal ganglia is worse. The area of cytotoxic edema within the hemorrhagic LEFT ACA territory infarct is also worse. No new areas of infarction are seen.  Calvarium intact. IMPRESSION: Improving subarachnoid hemorrhage. Progression of cytotoxic edema in the infarcted tissue of the  LEFT hemisphere is described. No new areas of involvement. Electronically Signed   By: Staci Righter M.D.   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 Does the need for close, 24 hr/day medical supervision in concert with the patient's rehab needs make it unreasonable for this patient to be served in a less intensive setting? Yes 2. Co-Morbidities requiring supervision/potential complications: Dysphagia, incontinence 3. Due to bladder management, bowel management, safety, skin/wound care, disease management, medication administration, pain management and patient education, does the patient require 24 hr/day rehab nursing? Yes 4. Does the patient require coordinated care of a physician, rehab nurse, PT (1-2 hrs/day, 55 days/week), OT (1-2 hrs/day, 5 days/week) and SLP (0.5-1 hrs/day, 5 days/week) to address physical and functional deficits in the context of the above medical diagnosis(es)? Yes Addressing deficits in the following areas: balance, endurance, locomotion, strength, transferring, bowel/bladder control, bathing, dressing, feeding, grooming, toileting, cognition, speech, language, swallowing and psychosocial support 5. Can the patient actively participate in an intensive therapy program of at least 3 hrs of therapy per day at least 5 days per week? Yes 6. The potential for patient to make measurable gains while on inpatient rehab is good 7. Anticipated functional outcomes upon discharge from inpatient rehab are min assist  with PT, min assist with OT, min assist with SLP. 8. Estimated rehab length of stay to reach the above functional goals is: 18-21 days 9. Does the patient have adequate social supports and living environment to accommodate these discharge functional goals? Yes 10. Anticipated D/C setting: Home 11. Anticipated  post D/C treatments: [place] therapy 12. Overall Rehab/Functional Prognosis: good  RECOMMENDATIONS: This patient's condition is appropriate for continued rehabilitative care in the following setting: CIR Patient has agreed to participate in recommended program. Potentially Note that insurance prior authorization may be required for reimbursement for recommended care.  Comment:     05/13/2015

## 2015-05-13 NOTE — Progress Notes (Signed)
Left Carotid Duplex has been completed.  Left carotid stent appears patent, however 50-75% stenosis noted in the left proximal ICA, based on stent criteria. Calcific plaque is noted with acoustic shadowing, therefore higher velocities may not be ruled out. Also, >50% left ECA stenosis.    Landry Mellow, RDMS, RVT 05/13/2015

## 2015-05-13 NOTE — Interval H&P Note (Signed)
Monica Pugh was admitted today to Inpatient Rehabilitation with the diagnosis of left frontal SAH and acute infarct left caudate, putamen and small areas of infarct left gurus and insular cortex.  The patient's history has been reviewed, patient examined, and there is no change in status.  Patient continues to be appropriate for intensive inpatient rehabilitation.  I have reviewed the patient's chart and labs.  Questions were answered to the patient's satisfaction. The PAPE has been reviewed and assessment remains appropriate.  Ankit Lorie Phenix 05/13/2015, 5:55 PM

## 2015-05-13 NOTE — Progress Notes (Signed)
Pt arrived to floor at 18:00. Assessment and VS per flowsheet. Son is with pt answering questions as needed. Pt able to answer some questions but is very aphasic at this time. Paged RT for breathing treatment - pt very wheezy. O2 2L applied for comfort and pt request. sats on RA 95%.

## 2015-05-13 NOTE — Progress Notes (Signed)
Occupational Therapy Treatment Patient Details Name: Monica Pugh MRN: [id number] DOB: May 30, 1950 Today's Date: 05/13/2015    History of present illness 65 y.o. female admitted with symptoms of aphasia, Rt neglect, and right sensorimotor deficits.  TPA was administered. She was intubated to protect her airway (extubated 3/20).  CT angio showed Rt ICA occlusion; IR thrombectomy with revascularization of the Lt ICA and MCA, and stent placed proximal ICA.  Post procedure showed left medial frontal intraparenchymal with left frontal subarachnoid hemmorhage.  No pertinent PMH   OT comments  Pt fatigued this pm.  She is slower to initiate activity, and less Rt UE spontaneous movement noted.   Follow Up Recommendations  CIR;Supervision/Assistance - 24 hour    Equipment Recommendations  3 in 1 bedside comode;Tub/shower bench    Recommendations for Other Services Rehab consult    Precautions / Restrictions Precautions Precautions: Fall Restrictions Weight Bearing Restrictions: No       Mobility Bed Mobility Overal bed mobility: Needs Assistance Bed Mobility: Supine to Sit;Sit to Supine Rolling: Mod assist   Supine to sit: Mod assist     General bed mobility comments: Pt lethargic and slow to initiate activity   Transfers Overall transfer level: Needs assistance Equipment used: 2 person hand held assist Transfers: Sit to/from Stand;Stand Pivot Transfers Sit to Stand: Min assist;+2 physical assistance Stand pivot transfers: Min assist;+2 physical assistance       General transfer comment: pt continues to R knee buckling during weightbearing, but does attempt to move LE through pivotal steps towards recliner.      Balance Overall balance assessment: Needs assistance Sitting-balance support: Feet supported Sitting balance-Leahy Scale: Fair     Standing balance support: During functional activity;Single extremity supported Standing balance-Leahy Scale: Poor                      ADL Overall ADL's : Needs assistance/impaired                         Toilet Transfer: Minimal assistance;+2 for physical assistance;Comfort height toilet;Ambulation   Toileting- Clothing Manipulation and Hygiene: Maximal assistance;Sit to/from stand         General ADL Comments: Pt incontinent of stool  Assisted with peri care       Vision                 Additional Comments: Pt unable to maintain attention to complete visual assessment    Perception     Praxis      Cognition   Behavior During Therapy: Flat affect Overall Cognitive Status: Impaired/Different from baseline Area of Impairment: Attention;Following commands   Current Attention Level: Sustained    Following Commands: Follows one step commands consistently     Problem Solving: Slow processing;Decreased initiation;Difficulty sequencing;Requires verbal cues;Requires tactile cues General Comments: pt with very minimal verbalizations throughout session, but does follow directions.      Extremity/Trunk Assessment               Exercises     Shoulder Instructions       General Comments      Pertinent Vitals/ Pain       Pain Assessment: No/denies pain  Home Living                                          Prior  Functioning/Environment              Frequency Min 3X/week     Progress Toward Goals  OT Goals(current goals can now be found in the care plan section)  Progress towards OT goals: Progressing toward goals  Acute Rehab OT Goals Patient Stated Goal: Pt did not state.  Family hopes she will be able to regain independence   Plan Discharge plan remains appropriate    Co-evaluation                 End of Session     Activity Tolerance Patient tolerated treatment well   Patient Left in bed;with call bell/phone within reach;with bed alarm set   Nurse Communication Mobility status        Time: [id number] OT Time  Calculation (min): 22 min  Charges: OT General Charges $OT Visit: 1 Procedure OT Treatments $Therapeutic Activity: 8-22 mins  Conarpe, Wendi M 05/13/2015, 4:45 PM

## 2015-05-13 NOTE — Progress Notes (Signed)
Call received per Respiratory Therapist regarding Pt increased wheezing and family concerns over breathing. Upon my arrival Pt found resting in bed. No apparent distress, Po2 sats 97-100% on 2 LNC. Lungs sounds with wheezes heard in bilateral upper lobes. Pt awakens to voice, falls asleep easily. Per family Pt has had several visitors this evening. BP 171/64 HR 84, RR 20. Rehab Provider on call paged per floor RN to suggest CXR and PRN for HTN. Pt mild HTN new this afternoon according to Epic charting from 3 M. Orders received per Pam Love P.A  for STAT CXR. Advised RN to call results to P.A when available and check rectal temp on Pt to r/o new fever. RRT will follow

## 2015-05-13 NOTE — Progress Notes (Signed)
[name] Rehab Admission Coordinator Signed Physical Medicine and Rehabilitation PMR Pre-admission 05/13/2015 3:50 PM  Related encounter: ED to Hosp-Admission (Discharged) from 05/09/2015 in [place] ICU    Expand All Collapse All   PMR Admission Coordinator Pre-Admission Assessment  Patient: Monica Pugh is an 65 y.o., female MRN: [id number] DOB: 26-Feb-1950 Height: 5\' 6"  (167.6 cm) Weight: 84.9 kg (187 lb 2.7 oz)  Insurance Information HMO: PPO: PCP: IPA: 80/20: OTHER:  PRIMARY: uninsured Policy#: Subscriber:  CM Name: Phone#: Fax#:  Pre-Cert#: Employer:  Benefits: Phone #: Name:  Eff. 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 female in relatively good health who was admitted on 05/09/15 with left facial droop, difficulty handling secretions, difficulty speaking and unresponsiveness. CT head negative and she was transferred to [facility] for treatment. She was treated with IV tPA and intubated for airway protection. 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 Right ICA remained occluded and left ICA now patent. 2D echo with EF 55-60% with grade 1 diastolic dysfunction and mild LAE. She had difficulty with wean wean with vomiting and possible RML aspiration PNA. She was started on Unasyn for treatment and tolerated extubation on 03/20. Patient on ASA/Plavix for secondary stroke prevention. 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 Therapy evaluations done yesterday and patient limited by right hemiparesis, apraxia, question of visual deficits and difficulty following commands. 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 CIR recommended for follow up therapy and patient admitted today.    NIH Total: 12    Past Medical History  History reviewed. No pertinent past medical history.  Family History  family history includes Cancer in her mother; Heart attack in her father.  Prior Rehab/Hospitalizations:  Has the patient had major surgery during 100 days prior to admission? No  Current Medications   Current facility-administered medications:  . 0.9 % sodium chloride infusion, , Intravenous, Continuous, Garvin Fila, MD, Last Rate: 100 mL/hr at 05/13/15 1500 . acetaminophen (TYLENOL) tablet 650 mg, 650 mg, Oral, Q4H PRN **OR** acetaminophen (TYLENOL) suppository 650 mg, 650 mg, Rectal, Q4H PRN, Ram Fuller Mandril, MD, 650 mg at 05/10/15 0421 . acetaminophen (TYLENOL) tablet 1,000 mg, 1,000 mg, Oral, Q6H PRN **OR** acetaminophen (TYLENOL) suppository 650 mg, 650 mg, Rectal, Q6H PRN, Luanne Bras, MD . Ampicillin-Sulbactam (UNASYN) 3 g in sodium chloride 0.9 % 100 mL IVPB, 3 g,  Intravenous, Q6H, Wynell Balloon, RPH, 3 g at 05/13/15 0928 . antiseptic oral rinse solution (CORINZ), 7 mL, Mouth Rinse, QID, Garvin Fila, MD, 7 mL at 05/13/15 1202 .  aspirin tablet 325 mg, 325 mg, Oral, Q breakfast, Luanne Bras, MD, 325 mg at 05/13/15 0730 . chlorhexidine gluconate (PERIDEX) 0.12 % solution 15 mL, 15 mL, Mouth Rinse, BID, Collene Gobble, MD, 15 mL at 05/13/15 0801 . clopidogrel (PLAVIX) tablet 75 mg, 75 mg, Oral, Q breakfast, Luanne Bras, MD, 75 mg at 05/13/15 0730 . ipratropium-albuterol (DUONEB) 0.5-2.5 (3) MG/3ML nebulizer solution 3 mL, 3 mL, Nebulization, Q4H PRN, Javier Glazier, MD, 3 mL at 05/13/15 0339 . labetalol (NORMODYNE,TRANDATE) injection 10 mg, 10 mg, Intravenous, Q10 min PRN, Ram Fuller Mandril, MD . ondansetron [facility]) injection 4 mg, 4 mg, Intravenous, Q6H PRN, Luanne Bras, MD, 4 mg at 05/10/15 1354 . pantoprazole (PROTONIX) EC tablet 20 mg, 20 mg, Oral, Daily, Javier Glazier, MD . phenylephrine (NEO-SYNEPHRINE) 10 mg in dextrose 5 % 250 mL (0.04 mg/mL) infusion, 0-400 mcg/min, Intravenous, Titrated, Collene Gobble, MD, Stopped at 05/12/15 2100 . RESOURCE THICKENUP CLEAR, , Oral, Once, Rigoberto Noel, MD . senna-docusate (Senokot-S) tablet 1 tablet, 1 tablet, Oral, QHS PRN, Ram Fuller Mandril, MD  Patients Current Diet: DIET DYS 2 Room service appropriate?: Yes; Fluid consistency:: Nectar Thick  Precautions / Restrictions Precautions Precautions: Fall Restrictions Weight Bearing Restrictions: No   Has the patient had 2 or more falls or a fall with injury in the past year?No  Prior Activity Level Community (5-7x/wk): Despite recently retiring patient was active, driving ans out of the house daily. Patient was gettign to start a second job to get out of the house more since retiring from a full time job.  Home Assistive Devices / Equipment Home Assistive Devices/Equipment: None Home Equipment:  None  Prior Device Use: Indicate devices/aids used by the patient prior to current illness, exacerbation or injury? None of the above  Prior Functional Level Prior Function Level of Independence: Independent  Self Care: Did the patient need help bathing, dressing, using the toilet or eating? Independent  Indoor Mobility: Did the patient need assistance with walking from room to room (with or without device)? Independent  Stairs: Did the patient need assistance with internal or external stairs (with or without device)? Independent  Functional Cognition: Did the patient need help planning regular tasks such as shopping or remembering to take medications? Independent  Current Functional Level Cognition  Arousal/Alertness: Awake/alert Overall Cognitive Status: Impaired/Different from baseline Current Attention Level: Sustained Orientation Level: Oriented X4 Following Commands: Follows one step commands with increased time, Follows one step commands consistently General Comments: pt with very minimal verbalizations throughout session, but does follow directions.  Attention: Focused Focused Attention: Impaired Focused Attention Impairment: Verbal basic, Functional basic Safety/Judgment: Impaired   Extremity Assessment (includes Sensation/Coordination)  Upper Extremity Assessment: Defer to OT evaluation RUE Deficits / Details: Pt initially does not attempt to move Rt UE on command. However, spontaneous movement noted when she was moving to EOB. After that, pt was able to reach to ~80 scaption, and was able to wipe eyes with washcloth using Rt UE. Decreased flexion/ext of digits noted. Edema noted  RUE Coordination: decreased fine motor, decreased gross motor  Lower Extremity Assessment: Difficult to assess due to impaired cognition, RLE deficits/detail, LLE deficits/detail RLE Deficits / Details: Pt not following commands well enough for formal MMT. However, pt does not  require assist for LE management during bed mobility or transfers (although partial Rt knee buckle) and even attempts to assist during pericare by abducting her hips. RLE Sensation: decreased light touch (impaired pain sensation) LLE Deficits /  Details: Pt not following commands well enough for formal MMT. However, pt does not require assist for LE management during bed mobility or transfers (although partial Rt knee buckle) and even attempts to assist during pericare by abducting her hips. LLE Sensation: decreased light touch (impaired pain sensation)    ADLs  Overall ADL's : Needs assistance/impaired Eating/Feeding: Moderate assistance, Sitting Grooming: Wash/dry hands, Wash/dry face, Brushing hair, Moderate assistance, Sitting Upper Body Bathing: Maximal assistance, Sitting Lower Body Bathing: Maximal assistance, Sit to/from stand Upper Body Dressing : Maximal assistance Lower Body Dressing: Sit to/from stand, Total assistance Toilet Transfer: Minimal assistance, +2 for physical assistance, BSC, Stand-pivot Toileting- Clothing Manipulation and Hygiene: Maximal assistance, Sit to/from stand Toileting - Clothing Manipulation Details (indicate cue type and reason): Pt incontinent of stool. Assisted with peri care in standing  Functional mobility during ADLs: Minimal assistance, +2 for physical assistance General ADL Comments: Pt is very motivated. family present     Mobility  Overal bed mobility: Needs Assistance, +2 for physical assistance Bed Mobility: Rolling, Supine to Sit Rolling: Min assist Supine to sit: Min assist, HOB elevated General bed mobility comments: A with bringing trunk up to sitting. Increased time needed.     Transfers  Overall transfer level: Needs assistance Equipment used: 2 person hand held assist Transfers: Sit to/from Stand, Stand Pivot Transfers Sit to Stand: Min assist, +2 physical assistance Stand pivot transfers: Min assist, +2 physical  assistance General transfer comment: pt continues to R knee buckling during weightbearing, but does attempt to move LE through pivotal steps towards recliner.     Ambulation / Gait / Stairs / Wheelchair Mobility  Ambulation/Gait General Gait Details: not assessed    Posture / Balance Dynamic Sitting Balance Sitting balance - Comments: min A initially for sitting EOB. Progressed to min guard assist. Able to reach with Lt and Rt UE  Balance Overall balance assessment: Needs assistance Sitting-balance support: Single extremity supported, Feet supported Sitting balance-Leahy Scale: Fair Sitting balance - Comments: min A initially for sitting EOB. Progressed to min guard assist. Able to reach with Lt and Rt UE  Standing balance support: During functional activity Standing balance-Leahy Scale: Poor Standing balance comment: requires Bil UE support    Special needs/care consideration BiPAP/CPAP: No, husband reports suspecting sleep apnea  CPM: No Continuous Drip IV: No Dialysis: No Life Vest: No Oxygen: No Special Bed: No Trach Size: No Wound Vac (area): No Skin: WDL Bowel mgmt: 05/13/15 frequent loose, incontinent stools  Bladder mgmt: inconsistent continence  Diabetic mgmt: HgbA1c 5.7     Previous Home Environment Living Arrangements: Spouse/significant other Lives With: Spouse Available Help at Discharge: Family, Available 24 hours/day Type of Home: House Home Layout: One level Home Access: Level entry Bathroom Shower/Tub: Tub/shower unit, Architectural technologist: Standard Additional Comments: Pt retired from Engineer, maintenance type job   Nurse, learning disability for Discharge Living Setting: Patient's home Type of Home at Discharge: House Discharge Home Layout: One level Discharge Home Access: Level entry Discharge Bathroom Shower/Tub: Tub/shower unit, Curtain Discharge Bathroom Toilet: Standard Discharge Bathroom Accessibility: Yes How  Accessible: Accessible via walker Does the patient have any problems obtaining your medications?: No  Social/Family/Support Systems Patient Roles: Spouse, Parent Anticipated Caregiver: Spouse: Chrissie Noa and daughter: Malachy Mood  Anticipated Caregiver's Contact Information: Chrissie Noa [phone] Ability/Limitations of Caregiver: daughter also available  Caregiver Availability: 24/7 Discharge Plan Discussed with Primary Caregiver: Yes Does Caregiver/Family have Issues with Lodging/Transportation while Pt is in Rehab?: No  Goals/Additional Needs Patient/Family Goal for Rehab:  OT/PT/SLP Min assist  Expected length of stay: 18-21 days Cultural Considerations: None Dietary Needs: None Equipment Needs: TBD Special Service Needs: None Additional Information: Patient's spouse reports that he is already working on disability paperwork with the help of the financial counselor Pt/Family Agrees to Admission and willing to participate: Yes Program Orientation Provided & Reviewed with Pt/Caregiver Including Roles & Responsibilities: Yes Additional Information Needs: None at this time Information Needs to be Provided By: N/A   Decrease burden of Care through IP rehab admission: No  Possible need for SNF placement upon discharge: Not anticipated   Patient Condition: This patient's condition remains as documented in the consult dated 05/13/15, in which the Rehabilitation Physician determined and documented that the patient's condition is appropriate for intensive rehabilitative care in an inpatient rehabilitation facility. Will admit to inpatient rehab today.  Preadmission Screen Completed By: [name], 05/13/2015 4:01 PM ______________________________________________________________________  Discussed status with Dr. Posey Pronto on 05/13/15 at 1557 and received telephone approval for admission today.  Admission Coordinator: [name], time 1557/Date 05/13/15          Cosigned by: Ankit Lorie Phenix, MD at 05/13/2015 4:40 PM  Revision History     Date/Time User Provider Type Action   05/13/2015 4:40 PM Ankit Lorie Phenix, MD Physician Cosign   05/13/2015 4:13 PM [name] Rehab Admission Coordinator Sign

## 2015-05-13 NOTE — H&P (Signed)
Physical Medicine and Rehabilitation Admission H&P    Chief Complaint  Patient presents with  . Right sided weakness, difficulty speaking,     HPI:   Monica Pugh is a 65 y.o. female in relatively good health who was admitted on 05/09/15 with left facial droop, difficulty handling secretions, difficulty speaking and unresponsiveness. History taken from chart review. CT head negative and she was transferred to [facility] for treatment. She was treated with IV tPA and intubated for airway protection. 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 Right ICA remained occluded and left ICA now patent. 2D echo with EF 55-60% with grade 1 diastolic dysfunction and mild LAE. She had difficulty with vent wean with vomiting and possible RML aspiration PNA. She was started on Unasyn for treatment and tolerated extubation on 03/20.  Patient on ASA/Plavix for secondary stroke prevention. 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 swallow evaluation done and she was placed on dysphagia 2, thin liquids due to neurogenic dysphagia. Therapy evaluations done and patient limited by right hemiparesis, apraxia, question of visual deficits and difficulty following commands. 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 CIR recommended for follow up therapy.    ROS  Unable to perform ROS: mental condition  History reviewed. No pertinent past medical history.    Past Surgical History  Procedure Laterality Date  . Tubal ligation    . Radiology with anesthesia N/A 05/09/2015    Procedure:  RADIOLOGY WITH ANESTHESIA;  Surgeon: Luanne Bras, MD;  Location: [facility];  Service: Radiology;  Laterality: N/A;    Family History  Problem Relation Age of Onset  . Cancer Mother   . Heart attack Father     Social History:  Married. Was laid off about 18 months ago. Husband retired and can assist after discharge?. Per reports that she has been smoking Cigarettes--1 1/2 PPD. She has a 60 pack-year smoking history. She does not have any smokeless tobacco history on file. Her alcohol and drug histories are not on file.    Allergies: No Known Allergies    Medications Prior to Admission  Medication Sig Dispense Refill  . acetaminophen (TYLENOL) 325 MG tablet Take 650 mg by mouth every 6 (six) hours as needed.    . pantoprazole (PROTONIX) 40 MG tablet Take 40 mg by mouth 2 (two) times daily.  2    Home: Home Living Family/patient expects to be discharged to:: Private residence Living Arrangements: Spouse/significant other Available Help at Discharge: Family, Available 24 hours/day Type of Home: House Home Access: Level entry [place]: One level Bathroom Shower/Tub: Tub/shower unit, Architectural technologist: Standard Home Equipment: None Additional Comments: Pt retired from Engineer, maintenance type job   Lives With: Spouse   Functional History: Prior Function Level of Independence: Independent  Functional Status:  Mobility: Bed Mobility Overal bed mobility: Needs Assistance, +2 for physical assistance Bed Mobility: Rolling, Supine to Sit Rolling: Min assist Supine to sit: Min assist, HOB elevated General bed mobility comments: A with bringing trunk up to sitting.  Increased time needed.   Transfers Overall transfer level: Needs assistance Equipment used: 2 person hand held assist Transfers:  Sit to/from Stand, Risk manager Sit to Stand: Min assist, +2 physical assistance Stand pivot transfers: Min assist, +2 physical assistance General transfer comment: pt  continues to R knee buckling during weightbearing, but does attempt to move LE through pivotal steps towards recliner.   Ambulation/Gait General Gait Details: not assessed    ADL: ADL Overall ADL's : Needs assistance/impaired Eating/Feeding: Moderate assistance, Sitting Grooming: Wash/dry hands, Wash/dry face, Brushing hair, Moderate assistance, Sitting Upper Body Bathing: Maximal assistance, Sitting Lower Body Bathing: Maximal assistance, Sit to/from stand Upper Body Dressing : Maximal assistance Lower Body Dressing: Sit to/from stand, Total assistance Toilet Transfer: Minimal assistance, +2 for physical assistance, BSC, Stand-pivot Toileting- Clothing Manipulation and Hygiene: Maximal assistance, Sit to/from stand Toileting - Clothing Manipulation Details (indicate cue type and reason): Pt incontinent of stool.  Assisted with peri care in standing  Functional mobility during ADLs: Minimal assistance, +2 for physical assistance General ADL Comments: Pt is very motivated.  family present   Cognition: Cognition Overall Cognitive Status: Impaired/Different from baseline Arousal/Alertness: Awake/alert Orientation Level: Oriented X4 Attention: Focused Focused Attention: Impaired Focused Attention Impairment: Verbal basic, Functional basic Safety/Judgment: Impaired Cognition Arousal/Alertness: Awake/alert Behavior During Therapy: Flat affect Overall Cognitive Status: Impaired/Different from baseline Area of Impairment: Attention, Following commands Current Attention Level: Sustained Following Commands: Follows one step commands with increased time, Follows one step commands consistently Problem Solving: Slow processing, Decreased initiation, Requires verbal cues, Requires tactile cues General Comments: pt with very minimal verbalizations throughout session, but does follow directions.     Blood pressure 154/74, pulse 66, temperature 99.7 F (37.6 C), temperature source Oral, resp.  rate 28, height '5\' 6"'  (1.676 m), weight 84.9 kg (187 lb 2.7 oz), SpO2 97 %. Physical Exam  Nursing note and vitals reviewed. Constitutional: She appears well-developed and well-nourished.  HENT:  Head: Normocephalic and atraumatic.  Mouth/Throat: Oropharynx is clear and moist.  Eyes: Conjunctivae are normal. Pupils are equal, round, and reactive to light.  Neck: Normal range of motion. Neck supple.  Cardiovascular: Normal rate and regular rhythm.   No murmur heard. Respiratory: Effort normal. No respiratory distress. She has rales in the right upper field and the right middle field. She exhibits no tenderness.  Significant upper airway rhonchi --just finished nectar juice.   GI: Soft. Bowel sounds are normal. She exhibits no distension. There is no tenderness.  Musculoskeletal: She exhibits edema (1+ edema RUE). She exhibits no tenderness.  Limited ROM at right wrist  Neurological: She is alert.  Right inattention--question peripheral field deficits.   Aphasic and apraxic with delayed processing.  Minimal verbal output limited.  Motor: RUE 3-/5 proximal to distal. RLE: 3-/5 proximal to distal LUE: 5/5 proximal to distal  LLE: 4+/5 proximal to distal  Skin: Skin is warm and dry.  Psychiatric: Her affect is inappropriate. Her speech is delayed. She is slowed. Cognition and memory are impaired. She is inattentive.   Results for orders placed or performed during the hospital encounter of 05/09/15 (from the past 48 hour(s))  Glucose, capillary     Status: Abnormal   Collection Time: 05/11/15  4:09 PM  Result Value Ref Range   Glucose-Capillary 148 (H) 65 - 99 mg/dL   Comment 1 Notify RN    Comment 2 Document in Chart   Glucose, capillary     Status: Abnormal   Collection Time: 05/11/15  8:04 PM  Result Value Ref Range   Glucose-Capillary 54 (L) 65 - 99 mg/dL  Glucose, capillary  Status: Abnormal   Collection Time: 05/11/15  8:38 PM  Result Value Ref Range   Glucose-Capillary  141 (H) 65 - 99 mg/dL  Glucose, capillary     Status: None   Collection Time: 05/11/15 11:39 PM  Result Value Ref Range   Glucose-Capillary 78 65 - 99 mg/dL  Glucose, capillary     Status: None   Collection Time: 05/12/15  3:31 AM  Result Value Ref Range   Glucose-Capillary 83 65 - 99 mg/dL  Triglycerides     Status: None   Collection Time: 05/12/15  5:20 AM  Result Value Ref Range   Triglycerides 88 <150 mg/dL  Glucose, capillary     Status: None   Collection Time: 05/12/15  8:08 AM  Result Value Ref Range   Glucose-Capillary 81 65 - 99 mg/dL  Glucose, capillary     Status: Abnormal   Collection Time: 05/12/15 11:21 AM  Result Value Ref Range   Glucose-Capillary 109 (H) 65 - 99 mg/dL   Comment 1 Notify RN    Comment 2 Document in Chart   Glucose, capillary     Status: None   Collection Time: 05/12/15  3:20 PM  Result Value Ref Range   Glucose-Capillary 66 65 - 99 mg/dL   Comment 1 Notify RN    Comment 2 Document in Chart   Glucose, capillary     Status: None   Collection Time: 05/12/15  3:55 PM  Result Value Ref Range   Glucose-Capillary 71 65 - 99 mg/dL   Comment 1 Notify RN    Comment 2 Document in Chart   Glucose, capillary     Status: None   Collection Time: 05/12/15  7:37 PM  Result Value Ref Range   Glucose-Capillary 91 65 - 99 mg/dL  Glucose, capillary     Status: None   Collection Time: 05/12/15 11:07 PM  Result Value Ref Range   Glucose-Capillary 73 65 - 99 mg/dL  Glucose, capillary     Status: None   Collection Time: 05/13/15  4:12 AM  Result Value Ref Range   Glucose-Capillary 88 65 - 99 mg/dL  Basic metabolic panel     Status: Abnormal   Collection Time: 05/13/15  5:34 AM  Result Value Ref Range   Sodium 146 (H) 135 - 145 mmol/L   Potassium 3.2 (L) 3.5 - 5.1 mmol/L   Chloride 115 (H) 101 - 111 mmol/L   CO2 20 (L) 22 - 32 mmol/L   Glucose, Bld 95 65 - 99 mg/dL   BUN 8 6 - 20 mg/dL   Creatinine, Ser 0.80 0.44 - 1.00 mg/dL   Calcium 8.2 (L) 8.9 -  10.3 mg/dL   GFR calc non Af Amer >60 >60 mL/min   GFR calc Af Amer >60 >60 mL/min    Comment: (NOTE) The eGFR has been calculated using the CKD EPI equation. This calculation has not been validated in all clinical situations. eGFR's persistently <60 mL/min signify possible Chronic Kidney Disease.    Anion gap 11 5 - 15  CBC     Status: Abnormal   Collection Time: 05/13/15  5:34 AM  Result Value Ref Range   WBC 9.2 4.0 - 10.5 K/uL   RBC 3.44 (L) 3.87 - 5.11 MIL/uL   Hemoglobin 10.6 (L) 12.0 - 15.0 g/dL   HCT 32.6 (L) 36.0 - 46.0 %   MCV 94.8 78.0 - 100.0 fL   MCH 30.8 26.0 - 34.0 pg   MCHC 32.5 30.0 -  36.0 g/dL   RDW 14.3 11.5 - 15.5 %   Platelets 188 150 - 400 K/uL  Magnesium     Status: None   Collection Time: 05/13/15  5:34 AM  Result Value Ref Range   Magnesium 1.9 1.7 - 2.4 mg/dL  Phosphorus     Status: None   Collection Time: 05/13/15  5:34 AM  Result Value Ref Range   Phosphorus 3.1 2.5 - 4.6 mg/dL  Glucose, capillary     Status: None   Collection Time: 05/13/15  8:01 AM  Result Value Ref Range   Glucose-Capillary 86 65 - 99 mg/dL   Comment 1 Notify RN    Comment 2 Document in Chart   Glucose, capillary     Status: None   Collection Time: 05/13/15 11:39 AM  Result Value Ref Range   Glucose-Capillary 84 65 - 99 mg/dL   Comment 1 Notify RN    Comment 2 Document in Chart    No results found.   Medical Problem List and Plan: 1.  Dysphagia, aphasia, weakness secondary to left frontal SAH and acute infarct left caudate, putamen and small areas of infarct left gurus and insular cortex 2.  DVT Prophylaxis/Anticoagulation: Mechanical: Sequential compression devices, below knee Bilateral lower extremities 3. Pain Management: tylenol prn for pain.  4. Mood: LCSW for evaluation and support.  5. Neuropsych: This patient is not capable of making decisions on her own behalf. 6. Skin/Wound Care: routine pressure relief measures 7. Fluids/Electrolytes/Nutrition: Monitor  I/O. Check lytes in am. 8. Aspiration PNA:  Continue Unasyn. Check follow up  9. Dysphagia: Strict aspiration precautions.  10  Tobacco abuse: Question COPD/OSA. Continue oxygen for now and wean as able.  11. Left ICA stent with thrombolysis: continue ASA/plavix and follow up with IVR in 6 weeks?   Post Admission Physician Evaluation: 1. Functional deficits secondary  to frontal SAH and acute infarct left caudate, putamen and small areas of infarct left gurus and insular cortex. 2. Patient is admitted to receive collaborative, interdisciplinary care between the physiatrist, rehab nursing staff, and therapy team. 3. Patient's level of medical complexity and substantial therapy needs in context of that medical necessity cannot be provided at a lesser intensity of care such as a SNF. 4. Patient has experienced substantial functional loss from his/her baseline which was documented above under the "Functional History" and "Functional Status" headings.  Judging by the patient's diagnosis, physical exam, and functional history, the patient has potential for functional progress which will result in measurable gains while on inpatient rehab.  These gains will be of substantial and practical use upon discharge  in facilitating mobility and self-care at the household level. 5. Physiatrist will provide 24 hour management of medical needs as well as oversight of the therapy plan/treatment and provide guidance as appropriate regarding the interaction of the two. 6. 24 hour rehab nursing will assist with bladder management, bowel management, safety, skin/wound care, disease management, medication administration, pain management and patient education and help integrate therapy concepts, techniques,education, etc. 7. PT will assess and treat for/with: Lower extremity strength, range of motion, stamina, balance, functional mobility, safety, adaptive techniques and equipment, coping skills, pain control, stroke  education.   Goals are: Min A/Supervision. 8. OT will assess and treat for/with: ADL's, functional mobility, safety, upper extremity strength, adaptive techniques and equipment, wound mgt, ego support, and community reintegration.   Goals are: Min A/Supervision. Therapy may proceed with showering this patient. 9. SLP will assess and treat for/with: speech, language,  cognition, swallowing.  Goals are: Min A. 10. Case Management and Social Worker will assess and treat for psychological issues and discharge planning. 11. Team conference will be held weekly to assess progress toward goals and to determine barriers to discharge. 12. Patient will receive at least 3 hours of therapy per day at least 5 days per week. 13. ELOS: 16-19 days.       14. Prognosis:  good  Delice Lesch, MD 05/13/2015

## 2015-05-13 NOTE — Progress Notes (Signed)
Inpatient Rehabilitation  Have bed available for admission today and patient has been medically cleared.  Will proceed with IP Rehab admission today.    Carmelia Roller., CCC/SLP Admission Coordinator  [facility]  Cell [phone]

## 2015-05-13 NOTE — H&P (View-Only) (Signed)
Physical Medicine and Rehabilitation Admission H&P    Chief Complaint  Patient presents with  . Right sided weakness, difficulty speaking,     HPI:   Monica Pugh is a 65 y.o. female in relatively good health who was admitted on 05/09/15 with left facial droop, difficulty handling secretions, difficulty speaking and unresponsiveness. History taken from chart review. CT head negative and she was transferred to [facility] for treatment. She was treated with IV tPA and intubated for airway protection. 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 Right ICA remained occluded and left ICA now patent. 2D echo with EF 55-60% with grade 1 diastolic dysfunction and mild LAE. She had difficulty with vent wean with vomiting and possible RML aspiration PNA. She was started on Unasyn for treatment and tolerated extubation on 03/20.  Patient on ASA/Plavix for secondary stroke prevention. 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 swallow evaluation done and she was placed on dysphagia 2, thin liquids due to neurogenic dysphagia. Therapy evaluations done and patient limited by right hemiparesis, apraxia, question of visual deficits and difficulty following commands. 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 CIR recommended for follow up therapy.    ROS  Unable to perform ROS: mental condition  History reviewed. No pertinent past medical history.    Past Surgical History  Procedure Laterality Date  . Tubal ligation    . Radiology with anesthesia N/A 05/09/2015    Procedure:  RADIOLOGY WITH ANESTHESIA;  Surgeon: Luanne Bras, MD;  Location: [employer];  Service: Radiology;  Laterality: N/A;    Family History  Problem Relation Age of Onset  . Cancer Mother   . Heart attack Father     Social History:  Married. Was laid off about 18 months ago. Husband retired and can assist after discharge?. Per reports that she has been smoking Cigarettes--1 1/2 PPD. She has a 60 pack-year smoking history. She does not have any smokeless tobacco history on file. Her alcohol and drug histories are not on file.    Allergies: No Known Allergies    Medications Prior to Admission  Medication Sig Dispense Refill  . acetaminophen (TYLENOL) 325 MG tablet Take 650 mg by mouth every 6 (six) hours as needed.    . pantoprazole (PROTONIX) 40 MG tablet Take 40 mg by mouth 2 (two) times daily.  2    Home: Home Living Family/patient expects to be discharged to:: Private residence Living Arrangements: Spouse/significant other Available Help at Discharge: Family, Available 24 hours/day Type of Home: House Home Access: Level entry [place]: One level Bathroom Shower/Tub: Tub/shower unit, Architectural technologist: Standard Home Equipment: None Additional Comments: Pt retired from Engineer, maintenance type job   Lives With: Spouse   Functional History: Prior Function Level of Independence: Independent  Functional Status:  Mobility: Bed Mobility Overal bed mobility: Needs Assistance, +2 for physical assistance Bed Mobility: Rolling, Supine to Sit Rolling: Min assist Supine to sit: Min assist, HOB elevated General bed mobility comments: A with bringing trunk up to sitting.  Increased time needed.   Transfers Overall transfer level: Needs assistance Equipment used: 2 person hand held assist Transfers:  Sit to/from Stand, Risk manager Sit to Stand: Min assist, +2 physical assistance Stand pivot transfers: Min assist, +2 physical assistance General transfer comment: pt  continues to R knee buckling during weightbearing, but does attempt to move LE through pivotal steps towards recliner.   Ambulation/Gait General Gait Details: not assessed    ADL: ADL Overall ADL's : Needs assistance/impaired Eating/Feeding: Moderate assistance, Sitting Grooming: Wash/dry hands, Wash/dry face, Brushing hair, Moderate assistance, Sitting Upper Body Bathing: Maximal assistance, Sitting Lower Body Bathing: Maximal assistance, Sit to/from stand Upper Body Dressing : Maximal assistance Lower Body Dressing: Sit to/from stand, Total assistance Toilet Transfer: Minimal assistance, +2 for physical assistance, BSC, Stand-pivot Toileting- Clothing Manipulation and Hygiene: Maximal assistance, Sit to/from stand Toileting - Clothing Manipulation Details (indicate cue type and reason): Pt incontinent of stool.  Assisted with peri care in standing  Functional mobility during ADLs: Minimal assistance, +2 for physical assistance General ADL Comments: Pt is very motivated.  family present   Cognition: Cognition Overall Cognitive Status: Impaired/Different from baseline Arousal/Alertness: Awake/alert Orientation Level: Oriented X4 Attention: Focused Focused Attention: Impaired Focused Attention Impairment: Verbal basic, Functional basic Safety/Judgment: Impaired Cognition Arousal/Alertness: Awake/alert Behavior During Therapy: Flat affect Overall Cognitive Status: Impaired/Different from baseline Area of Impairment: Attention, Following commands Current Attention Level: Sustained Following Commands: Follows one step commands with increased time, Follows one step commands consistently Problem Solving: Slow processing, Decreased initiation, Requires verbal cues, Requires tactile cues General Comments: pt with very minimal verbalizations throughout session, but does follow directions.     Blood pressure 154/74, pulse 66, temperature 99.7 F (37.6 C), temperature source Oral, resp.  rate 28, height '5\' 6"'  (1.676 m), weight 84.9 kg (187 lb 2.7 oz), SpO2 97 %. Physical Exam  Nursing note and vitals reviewed. Constitutional: She appears well-developed and well-nourished.  HENT:  Head: Normocephalic and atraumatic.  Mouth/Throat: Oropharynx is clear and moist.  Eyes: Conjunctivae are normal. Pupils are equal, round, and reactive to light.  Neck: Normal range of motion. Neck supple.  Cardiovascular: Normal rate and regular rhythm.   No murmur heard. Respiratory: Effort normal. No respiratory distress. She has rales in the right upper field and the right middle field. She exhibits no tenderness.  Significant upper airway rhonchi --just finished nectar juice.   GI: Soft. Bowel sounds are normal. She exhibits no distension. There is no tenderness.  Musculoskeletal: She exhibits edema (1+ edema RUE). She exhibits no tenderness.  Limited ROM at right wrist  Neurological: She is alert.  Right inattention--question peripheral field deficits.   Aphasic and apraxic with delayed processing.  Minimal verbal output limited.  Motor: RUE 3-/5 proximal to distal. RLE: 3-/5 proximal to distal LUE: 5/5 proximal to distal  LLE: 4+/5 proximal to distal  Skin: Skin is warm and dry.  Psychiatric: Her affect is inappropriate. Her speech is delayed. She is slowed. Cognition and memory are impaired. She is inattentive.   Results for orders placed or performed during the hospital encounter of 05/09/15 (from the past 48 hour(s))  Glucose, capillary     Status: Abnormal   Collection Time: 05/11/15  4:09 PM  Result Value Ref Range   Glucose-Capillary 148 (H) 65 - 99 mg/dL   Comment 1 Notify RN    Comment 2 Document in Chart   Glucose, capillary     Status: Abnormal   Collection Time: 05/11/15  8:04 PM  Result Value Ref Range   Glucose-Capillary 54 (L) 65 - 99 mg/dL  Glucose, capillary  Status: Abnormal   Collection Time: 05/11/15  8:38 PM  Result Value Ref Range   Glucose-Capillary  141 (H) 65 - 99 mg/dL  Glucose, capillary     Status: None   Collection Time: 05/11/15 11:39 PM  Result Value Ref Range   Glucose-Capillary 78 65 - 99 mg/dL  Glucose, capillary     Status: None   Collection Time: 05/12/15  3:31 AM  Result Value Ref Range   Glucose-Capillary 83 65 - 99 mg/dL  Triglycerides     Status: None   Collection Time: 05/12/15  5:20 AM  Result Value Ref Range   Triglycerides 88 <150 mg/dL  Glucose, capillary     Status: None   Collection Time: 05/12/15  8:08 AM  Result Value Ref Range   Glucose-Capillary 81 65 - 99 mg/dL  Glucose, capillary     Status: Abnormal   Collection Time: 05/12/15 11:21 AM  Result Value Ref Range   Glucose-Capillary 109 (H) 65 - 99 mg/dL   Comment 1 Notify RN    Comment 2 Document in Chart   Glucose, capillary     Status: None   Collection Time: 05/12/15  3:20 PM  Result Value Ref Range   Glucose-Capillary 66 65 - 99 mg/dL   Comment 1 Notify RN    Comment 2 Document in Chart   Glucose, capillary     Status: None   Collection Time: 05/12/15  3:55 PM  Result Value Ref Range   Glucose-Capillary 71 65 - 99 mg/dL   Comment 1 Notify RN    Comment 2 Document in Chart   Glucose, capillary     Status: None   Collection Time: 05/12/15  7:37 PM  Result Value Ref Range   Glucose-Capillary 91 65 - 99 mg/dL  Glucose, capillary     Status: None   Collection Time: 05/12/15 11:07 PM  Result Value Ref Range   Glucose-Capillary 73 65 - 99 mg/dL  Glucose, capillary     Status: None   Collection Time: 05/13/15  4:12 AM  Result Value Ref Range   Glucose-Capillary 88 65 - 99 mg/dL  Basic metabolic panel     Status: Abnormal   Collection Time: 05/13/15  5:34 AM  Result Value Ref Range   Sodium 146 (H) 135 - 145 mmol/L   Potassium 3.2 (L) 3.5 - 5.1 mmol/L   Chloride 115 (H) 101 - 111 mmol/L   CO2 20 (L) 22 - 32 mmol/L   Glucose, Bld 95 65 - 99 mg/dL   BUN 8 6 - 20 mg/dL   Creatinine, Ser 0.80 0.44 - 1.00 mg/dL   Calcium 8.2 (L) 8.9 -  10.3 mg/dL   GFR calc non Af Amer >60 >60 mL/min   GFR calc Af Amer >60 >60 mL/min    Comment: (NOTE) The eGFR has been calculated using the CKD EPI equation. This calculation has not been validated in all clinical situations. eGFR's persistently <60 mL/min signify possible Chronic Kidney Disease.    Anion gap 11 5 - 15  CBC     Status: Abnormal   Collection Time: 05/13/15  5:34 AM  Result Value Ref Range   WBC 9.2 4.0 - 10.5 K/uL   RBC 3.44 (L) 3.87 - 5.11 MIL/uL   Hemoglobin 10.6 (L) 12.0 - 15.0 g/dL   HCT 32.6 (L) 36.0 - 46.0 %   MCV 94.8 78.0 - 100.0 fL   MCH 30.8 26.0 - 34.0 pg   MCHC 32.5 30.0 -  36.0 g/dL   RDW 14.3 11.5 - 15.5 %   Platelets 188 150 - 400 K/uL  Magnesium     Status: None   Collection Time: 05/13/15  5:34 AM  Result Value Ref Range   Magnesium 1.9 1.7 - 2.4 mg/dL  Phosphorus     Status: None   Collection Time: 05/13/15  5:34 AM  Result Value Ref Range   Phosphorus 3.1 2.5 - 4.6 mg/dL  Glucose, capillary     Status: None   Collection Time: 05/13/15  8:01 AM  Result Value Ref Range   Glucose-Capillary 86 65 - 99 mg/dL   Comment 1 Notify RN    Comment 2 Document in Chart   Glucose, capillary     Status: None   Collection Time: 05/13/15 11:39 AM  Result Value Ref Range   Glucose-Capillary 84 65 - 99 mg/dL   Comment 1 Notify RN    Comment 2 Document in Chart    No results found.   Medical Problem List and Plan: 1.  Dysphagia, aphasia, weakness secondary to left frontal SAH and acute infarct left caudate, putamen and small areas of infarct left gurus and insular cortex 2.  DVT Prophylaxis/Anticoagulation: Mechanical: Sequential compression devices, below knee Bilateral lower extremities 3. Pain Management: tylenol prn for pain.  4. Mood: LCSW for evaluation and support.  5. Neuropsych: This patient is not capable of making decisions on her own behalf. 6. Skin/Wound Care: routine pressure relief measures 7. Fluids/Electrolytes/Nutrition: Monitor  I/O. Check lytes in am. 8. Aspiration PNA:  Continue Unasyn. Check follow up  9. Dysphagia: Strict aspiration precautions.  10  Tobacco abuse: Question COPD/OSA. Continue oxygen for now and wean as able.  11. Left ICA stent with thrombolysis: continue ASA/plavix and follow up with IVR in 6 weeks?   Post Admission Physician Evaluation: 1. Functional deficits secondary  to frontal SAH and acute infarct left caudate, putamen and small areas of infarct left gurus and insular cortex. 2. Patient is admitted to receive collaborative, interdisciplinary care between the physiatrist, rehab nursing staff, and therapy team. 3. Patient's level of medical complexity and substantial therapy needs in context of that medical necessity cannot be provided at a lesser intensity of care such as a SNF. 4. Patient has experienced substantial functional loss from his/her baseline which was documented above under the "Functional History" and "Functional Status" headings.  Judging by the patient's diagnosis, physical exam, and functional history, the patient has potential for functional progress which will result in measurable gains while on inpatient rehab.  These gains will be of substantial and practical use upon discharge  in facilitating mobility and self-care at the household level. 5. Physiatrist will provide 24 hour management of medical needs as well as oversight of the therapy plan/treatment and provide guidance as appropriate regarding the interaction of the two. 6. 24 hour rehab nursing will assist with bladder management, bowel management, safety, skin/wound care, disease management, medication administration, pain management and patient education and help integrate therapy concepts, techniques,education, etc. 7. PT will assess and treat for/with: Lower extremity strength, range of motion, stamina, balance, functional mobility, safety, adaptive techniques and equipment, coping skills, pain control, stroke  education.   Goals are: Min A/Supervision. 8. OT will assess and treat for/with: ADL's, functional mobility, safety, upper extremity strength, adaptive techniques and equipment, wound mgt, ego support, and community reintegration.   Goals are: Min A/Supervision. Therapy may proceed with showering this patient. 9. SLP will assess and treat for/with: speech, language,  cognition, swallowing.  Goals are: Min A. 10. Case Management and Social Worker will assess and treat for psychological issues and discharge planning. 11. Team conference will be held weekly to assess progress toward goals and to determine barriers to discharge. 12. Patient will receive at least 3 hours of therapy per day at least 5 days per week. 13. ELOS: 16-19 days.       14. Prognosis:  good  Delice Lesch, MD 05/13/2015

## 2015-05-13 NOTE — Progress Notes (Signed)
Inpatient Rehabilitation  Met with patient, husband and son at bedside to discuss team's recommendation for IP Rehab.  Husband and patient in agreement with plan for intensive therapies to maximize patient's function prior to discharge.  Plan to follow up later today regarding rehab bed availability.  Please call with questions.  Carmelia Roller., CCC/SLP Admission Coordinator  [place]  Cell [phone]

## 2015-05-13 NOTE — Progress Notes (Signed)
Physical Therapy Treatment Patient Details Name: Monica Pugh MRN: [id number] DOB: 04/29/1950 Today's Date: 05/13/2015    History of Present Illness 65 y.o. female admitted with symptoms of aphasia, Rt neglect, and right sensorimotor deficits.  TPA was administered. She was intubated to protect her airway (extubated 3/20).  CT angio showed Rt ICA occlusion; IR thrombectomy with revascularization of the Lt ICA and MCA, and stent placed proximal ICA.  Post procedure showed left medial frontal intraparenchymal with left frontal subarachnoid hemmorhage.  No pertinent PMH    PT Comments    Pt with no verbalizations throughout session, but does well following directions for mobility and attempts to A as much as she is able.  Pt with loose BM in bed requiring total A for hygiene prior to OOB to recliner.  Continue to feel pt would benefit from CIR at D/C.    Follow Up Recommendations  CIR     Equipment Recommendations  None recommended by PT    Recommendations for Other Services Rehab consult     Precautions / Restrictions Precautions Precautions: Fall Restrictions Weight Bearing Restrictions: No    Mobility  Bed Mobility Overal bed mobility: Needs Assistance;+2 for physical assistance Bed Mobility: Rolling;Supine to Sit Rolling: Min assist   Supine to sit: Min assist;HOB elevated     General bed mobility comments: A with bringing trunk up to sitting.  Increased time needed.    Transfers Overall transfer level: Needs assistance Equipment used: 2 person hand held assist Transfers: Sit to/from [facility] Sit to Stand: Min assist;+2 physical assistance Stand pivot transfers: Min assist;+2 physical assistance       General transfer comment: pt continues to R knee buckling during weightbearing, but does attempt to move LE through pivotal steps towards recliner.    Ambulation/Gait                 Stairs            Wheelchair Mobility    Modified  Rankin (Stroke Patients Only) Modified Rankin (Stroke Patients Only) Pre-Morbid Rankin Score: No symptoms Modified Rankin: Severe disability     Balance Overall balance assessment: Needs assistance Sitting-balance support: Single extremity supported;Feet supported Sitting balance-Leahy Scale: Fair     Standing balance support: During functional activity Standing balance-Leahy Scale: Poor                      Cognition Arousal/Alertness: Awake/alert Behavior During Therapy: Flat affect Overall Cognitive Status: Impaired/Different from baseline Area of Impairment: Attention;Following commands   Current Attention Level: Sustained   Following Commands: Follows one step commands with increased time;Follows one step commands consistently       General Comments: pt with very minimal verbalizations throughout session, but does follow directions.      Exercises      General Comments        Pertinent Vitals/Pain Pain Assessment: No/denies pain    Home Living                      Prior Function            PT Goals (current goals can now be found in the care plan section) Acute Rehab PT Goals Patient Stated Goal: Pt did not state.  Family hopes she will be able to regain independence  PT Goal Formulation: With family Time For Goal Achievement: 06/02/15 Potential to Achieve Goals: Good Progress towards PT goals: Progressing toward goals  Frequency  Min 4X/week    PT Plan Current plan remains appropriate    Co-evaluation             End of Session Equipment Utilized During Treatment: Gait belt Activity Tolerance: Patient tolerated treatment well Patient left: in chair;with call bell/phone within reach;with chair alarm set     Time: 1135-1159 PT Time Calculation (min) (ACUTE ONLY): 24 min  Charges:  $Therapeutic Activity: 23-37 mins                    G CodesCatarina Hartshorn, [place] 05/13/2015, 3:07 PM

## 2015-05-13 NOTE — PMR Pre-admission (Signed)
PMR Admission Coordinator Pre-Admission Assessment  Patient: Monica Pugh is an 65 y.o., female MRN: [id number] DOB: Aug 25, 1950 Height: 5\' 6"  (167.6 cm) Weight: 84.9 kg (187 lb 2.7 oz)              Insurance Information HMO:     PPO:      PCP:      IPA:      80/20:      OTHER:  PRIMARY: uninsured      Policy#:       Subscriber:  CM Name:       Phone#:      Fax#:  Pre-Cert#:       Employer:  Benefits:  Phone #:      Name:  Eff. 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 female in relatively good health who was admitted on 05/09/15 with left facial droop, difficulty handling secretions, difficulty speaking and unresponsiveness. CT head negative and she was transferred to [facility] for treatment. She was treated with IV tPA and intubated for airway protection. 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 Right ICA remained occluded and left ICA now patent. 2D echo with EF 55-60% with grade 1 diastolic dysfunction and mild LAE. She had difficulty with wean wean with vomiting and possible  RML aspiration PNA. She was started on Unasyn for treatment and tolerated extubation on 03/20.  Patient on ASA/Plavix for secondary stroke prevention. 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 Therapy evaluations done yesterday and patient limited by right hemiparesis, apraxia, question of visual deficits and difficulty following commands. 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 CIR recommended for follow up therapy and patient admitted today.    NIH Total: 12    Past Medical History  History reviewed. No pertinent past medical history.  Family History  family history includes Cancer in her mother; Heart attack in her father.  Prior Rehab/Hospitalizations:  Has the patient had major surgery during 100 days prior to admission? No  Current Medications   Current facility-administered medications:  .  0.9 %  sodium chloride infusion, , Intravenous, Continuous, Garvin Fila, MD, Last Rate: 100 mL/hr at 05/13/15 1500 .  acetaminophen (TYLENOL) tablet 650 mg, 650 mg, Oral, Q4H PRN **OR** acetaminophen (TYLENOL) suppository 650 mg, 650 mg, Rectal, Q4H  PRN, Maisie Fus, MD, 650 mg at 05/10/15 0421 .  acetaminophen (TYLENOL) tablet 1,000 mg, 1,000 mg, Oral, Q6H PRN **OR** acetaminophen (TYLENOL) suppository 650 mg, 650 mg, Rectal, Q6H PRN, Luanne Bras, MD .  Ampicillin-Sulbactam (UNASYN) 3 g in sodium chloride 0.9 % 100 mL IVPB, 3 g, Intravenous, Q6H, Wynell Balloon, RPH, 3 g at 05/13/15 0928 .  antiseptic oral rinse solution (CORINZ), 7 mL, Mouth Rinse, QID, Garvin Fila, MD, 7 mL at 05/13/15 1202 .  aspirin tablet 325 mg, 325 mg, Oral, Q breakfast, Luanne Bras, MD, 325 mg at 05/13/15 0730 .  chlorhexidine gluconate (PERIDEX) 0.12 % solution 15 mL, 15 mL, Mouth Rinse, BID, Collene Gobble, MD, 15 mL at 05/13/15 0801 .  clopidogrel (PLAVIX) tablet 75  mg, 75 mg, Oral, Q breakfast, Luanne Bras, MD, 75 mg at 05/13/15 0730 .  ipratropium-albuterol (DUONEB) 0.5-2.5 (3) MG/3ML nebulizer solution 3 mL, 3 mL, Nebulization, Q4H PRN, Javier Glazier, MD, 3 mL at 05/13/15 0339 .  labetalol (NORMODYNE,TRANDATE) injection 10 mg, 10 mg, Intravenous, Q10 min PRN, Ram Fuller Mandril, MD .  ondansetron [facility]) injection 4 mg, 4 mg, Intravenous, Q6H PRN, Luanne Bras, MD, 4 mg at 05/10/15 1354 .  pantoprazole (PROTONIX) EC tablet 20 mg, 20 mg, Oral, Daily, Javier Glazier, MD .  phenylephrine (NEO-SYNEPHRINE) 10 mg in dextrose 5 % 250 mL (0.04 mg/mL) infusion, 0-400 mcg/min, Intravenous, Titrated, Collene Gobble, MD, Stopped at 05/12/15 2100 .  RESOURCE THICKENUP CLEAR, , Oral, Once, Rigoberto Noel, MD .  senna-docusate (Senokot-S) tablet 1 tablet, 1 tablet, Oral, QHS PRN, Ram Fuller Mandril, MD  Patients Current Diet: DIET DYS 2 Room service appropriate?: Yes; Fluid consistency:: Nectar Thick  Precautions / Restrictions Precautions Precautions: Fall Restrictions Weight Bearing Restrictions: No   Has the patient had 2 or more falls or a fall with injury in the past year?No  Prior Activity Level Community (5-7x/wk): Despite recently retiring patient was active, driving ans out of the house daily.  Patient was gettign to start a second job to get out of the house more since retiring from a full time job.  Home Assistive Devices / Equipment Home Assistive Devices/Equipment: None Home Equipment: None  Prior Device Use: Indicate devices/aids used by the patient prior to current illness, exacerbation or injury? None of the above  Prior Functional Level Prior Function Level of Independence: Independent  Self Care: Did the patient need help bathing, dressing, using the toilet or eating?  Independent  Indoor Mobility: Did the patient need assistance with walking from room to room (with or without device)?  Independent  Stairs: Did the patient need assistance with internal or external stairs (with or without device)? Independent  Functional Cognition: Did the patient need help planning regular tasks such as shopping or remembering to take medications? Independent  Current Functional Level Cognition  Arousal/Alertness: Awake/alert Overall Cognitive Status: Impaired/Different from baseline Current Attention Level: Sustained Orientation Level: Oriented X4 Following Commands: Follows one step commands with increased time, Follows one step commands consistently General Comments: pt with very minimal verbalizations throughout session, but does follow directions.   Attention: Focused Focused Attention: Impaired Focused Attention Impairment: Verbal basic, Functional basic Safety/Judgment: Impaired    Extremity Assessment (includes Sensation/Coordination)  Upper Extremity Assessment: Defer to OT evaluation RUE Deficits / Details: Pt initially does not attempt to move Rt UE on command.  However, spontaneous movement noted when she was moving to EOB.  After that, pt  was able to reach to ~80 scaption, and was able to wipe eyes with washcloth using Rt UE.  Decreased flexion/ext of digits noted.  Edema noted  RUE Coordination: decreased fine motor, decreased gross motor  Lower Extremity Assessment: Difficult to assess due to impaired cognition, RLE deficits/detail, LLE deficits/detail RLE Deficits / Details: Pt not following commands well enough for formal MMT.  However, pt does not require assist for LE management during bed mobility or transfers (although partial Rt knee buckle) and even attempts to assist during pericare by abducting her hips. RLE Sensation: decreased light touch (impaired pain sensation) LLE Deficits / Details: Pt not following commands well enough for formal MMT.  However, pt does not require assist for LE management during bed mobility or transfers (although partial Rt knee buckle)  and even attempts to assist during pericare by abducting her hips. LLE Sensation: decreased light touch (impaired pain sensation)    ADLs  Overall ADL's : Needs assistance/impaired Eating/Feeding: Moderate assistance, Sitting Grooming: Wash/dry hands, Wash/dry face, Brushing hair, Moderate assistance, Sitting Upper Body Bathing: Maximal assistance, Sitting Lower Body Bathing: Maximal assistance, Sit to/from stand Upper Body Dressing : Maximal assistance Lower Body Dressing: Sit to/from stand, Total assistance Toilet Transfer: Minimal assistance, +2 for physical assistance, BSC, Stand-pivot Toileting- Clothing Manipulation and Hygiene: Maximal assistance, Sit to/from stand Toileting - Clothing Manipulation Details (indicate cue type and reason): Pt incontinent of stool.  Assisted with peri care in standing  Functional mobility during ADLs: Minimal assistance, +2 for physical assistance General ADL Comments: Pt is very motivated.  family present     Mobility  Overal bed mobility: Needs Assistance, +2 for physical assistance Bed Mobility: Rolling, Supine to Sit Rolling: Min assist Supine to sit: Min assist, HOB elevated General bed mobility comments: A with bringing trunk up to sitting.  Increased time needed.      Transfers  Overall transfer level: Needs assistance Equipment used: 2 person hand held assist Transfers: Sit to/from Stand, Stand Pivot Transfers Sit to Stand: Min assist, +2 physical assistance Stand pivot transfers: Min assist, +2 physical assistance General transfer comment: pt continues to R knee buckling during weightbearing, but does attempt to move LE through pivotal steps towards recliner.      Ambulation / Gait / Stairs / Wheelchair Mobility  Ambulation/Gait General Gait Details: not assessed    Posture / Balance Dynamic Sitting Balance Sitting balance - Comments: min A initially for sitting EOB.  Progressed to min guard assist.  Able to reach with Lt and Rt UE   Balance Overall balance assessment: Needs assistance Sitting-balance support: Single extremity supported, Feet supported Sitting balance-Leahy Scale: Fair Sitting balance - Comments: min A initially for sitting EOB.  Progressed to min guard assist.  Able to reach with Lt and Rt UE  Standing balance support: During functional activity Standing balance-Leahy Scale: Poor Standing balance comment: requires Bil UE support    Special needs/care consideration BiPAP/CPAP: No, husband reports suspecting sleep apnea  CPM: No Continuous Drip IV: No Dialysis: No Life Vest: No Oxygen: No Special Bed: No Trach Size: No Wound Vac (area): No Skin: WDL Bowel mgmt: 05/13/15 frequent loose, incontinent stools  Bladder mgmt: inconsistent continence  Diabetic mgmt: HgbA1c 5.7     Previous Home Environment Living Arrangements: Spouse/significant other  Lives With: Spouse Available Help at Discharge: Family, Available 24 hours/day Type of Home: House Home Layout: One level Home Access: Level entry Bathroom Shower/Tub: Tub/shower unit, Architectural technologist: Standard Additional Comments: Pt retired from  factory/assembly type job   Discharge Living Setting Plans for Discharge Living Setting: Patient's home Type of Home at Discharge: House Discharge Home Layout: One level Discharge Home Access: Level entry Discharge Bathroom Shower/Tub: Tub/shower unit, Curtain Discharge Bathroom Toilet: Standard Discharge Bathroom Accessibility: Yes How Accessible: Accessible via walker Does the patient have any problems obtaining your medications?: No  Social/Family/Support Systems Patient Roles: Spouse, Parent Anticipated Caregiver: Spouse: Chrissie Noa and daughter: Malachy Mood  Anticipated Caregiver's Contact Information: Chrissie Noa [phone] Ability/Limitations of Caregiver: daughter also available  Caregiver Availability: 24/7 Discharge Plan Discussed with Primary Caregiver: Yes Does Caregiver/Family have  Issues with Lodging/Transportation while Pt is in Rehab?: No  Goals/Additional Needs Patient/Family Goal for Rehab: OT/PT/SLP Min assist  Expected length of stay: 18-21 days Cultural Considerations: None Dietary Needs: None Equipment Needs: TBD Special Service Needs: None Additional Information: Patient's spouse reports that he is already working on disability paperwork with the help of the financial counselor Pt/Family Agrees to Admission and willing to participate: Yes Program Orientation Provided & Reviewed with Pt/Caregiver Including Roles  & Responsibilities: Yes Additional Information Needs: None at this time Information Needs to be Provided By: N/A   Decrease burden of Care through IP rehab admission: No  Possible need for SNF placement upon discharge: Not anticipated   Patient Condition: This patient's condition remains as documented in the consult dated 05/13/15, in which the Rehabilitation Physician determined and documented that the patient's condition is appropriate for intensive rehabilitative care in an inpatient rehabilitation facility. Will admit to inpatient rehab today.  Preadmission Screen Completed By:  [name], 05/13/2015 4:01 PM ______________________________________________________________________   Discussed status with Dr. Posey Pronto on 05/13/15 at 1557 and received telephone approval for admission today.  Admission Coordinator:  [name], time 1557/Date 05/13/15

## 2015-05-13 NOTE — Care Management Note (Signed)
Case Management Note  Patient Details  Name: Monica Pugh MRN: [id number] Date of Birth: October 02, 1950  Subjective/Objective:   Pt admited on 05/09/15 Lt MCA infarct.  PTA, pt resided at home with spouse.                 Action/Plan: Pt medically stable for dc to inpatient rehab today.    Expected Discharge Date:    05/13/15              Expected Discharge Plan:  [place]  In-House Referral:     Discharge planning Services  CM Consult  Post Acute Care Choice:    Choice offered to:     DME Arranged:    DME Agency:     HH Arranged:    HH Agency:     Status of Service:  Completed, signed off  Medicare Important Message Given:    Date Medicare IM Given:    Medicare IM give by:    Date Additional Medicare IM Given:    Additional Medicare Important Message give by:     If discussed at [place] of Stay Meetings, dates discussed:    Additional Comments:  Reinaldo Raddle, RN, BSN  Trauma/Neuro ICU Case Manager [phone]

## 2015-05-13 NOTE — Progress Notes (Signed)
Speech Language Pathology Treatment: Dysphagia  Patient Details Name: Monica Pugh MRN: [id number] DOB: 1950-06-20 Today's Date: 05/13/2015 Time: [id number] SLP Time Calculation (min) (ACUTE ONLY): 17 min  Assessment / Plan / Recommendation Clinical Impression  Treatment focused on dysphagia goals. Patient alert and cooperative. Baseline wheezing noted, ? reklation to smoking at baseline per RN. Po trials provided. SLP provided min-mod verbal and tactile cues for safe swallowing strategies, particularly small sips and slow rate of intake with liquids to decrease aspiration risk. No overt indication of aspiration noted with solids, nectar thick, or thin liquids however patient continues with prolonged oral transit of soft solids and although no overt indication of aspiration with thin liquids, risk remains moderately high given respiratory deficits with increased WOB post intake. Recommend continuation of current diet with close f/u at bedside for potential to advance with improved respiratory status and continued evidence of intact airway protection.    HPI HPI: 65 y.o. female patient who presented with symptoms of aphasia, right neglect and evidence of right sensorimotor deficits. Intubated;  TPA administered; CT angiogram showed right ICA occlusion; IR thrombectomy with revascularization of left ICA and MCA, stent placed proximal internal carotid. Post-procedure CT showed left medial frontal intraparenchymal hemorrhage with left frontal subarachnoid hemorrhage.  ETT 3/18-3/20.      SLP Plan  Continue with current plan of care     Recommendations  Diet recommendations: Dysphagia 2 (fine chop);Nectar-thick liquid Liquids provided via: Cup Medication Administration: Crushed with puree Supervision: Patient able to self feed;Full supervision/cueing for compensatory strategies Compensations: Slow rate;Small sips/bites Postural Changes and/or Swallow Maneuvers: Seated upright 90 degrees             General recommendations: Rehab consult Oral Care Recommendations: Oral care BID Follow up Recommendations: Inpatient Rehab Plan: Continue with current plan of care     [place], [place] [phone]    [place] 05/13/2015, 10:06 AM

## 2015-05-13 NOTE — Progress Notes (Signed)
STROKE TEAM PROGRESS NOTE   HISTORY OF PRESENT ILLNESS Olufunke Blickenstaff is an 65 y.o. female patient who was brought in via EMS for evaluation of altered mental status and unresponsiveness. Patient with no symptom past medical history. Does not take any medications at home except for occasional acid pills. Her has been last spoke to the patient at 10 AM, which is the time she was last seen normal. When he returned home after some shopping errands, he found her unresponsive, nonverbal and not following commands. EMS were called and brought her to the ER.   EMS Arrival date & time 05/09/15 1137 Patient arrived in the ER room after CT at 1155 IV TPA initiated at 1205   Date last known well: 05/09/2015 Time last known well: 10 am tPA Given: Yes   SUBJECTIVE (INTERVAL HISTORY)  family  is at bedside. Patient is awake and much more interactive but remains slightly abulic and aphasic    OBJECTIVE Temp:  [98.6 F (37 C)-100.4 F (38 C)] 98.8 F (37.1 C) (03/22 1400) Pulse Rate:  [53-82] 67 (03/22 1400) Cardiac Rhythm:  [-] Normal sinus rhythm (03/21 2000) Resp:  [17-30] 28 (03/22 1200) BP: (95-158)/(45-70) 143/53 mmHg (03/22 1200) SpO2:  [92 %-100 %] 99 % (03/22 1400)  CBC:   Recent Labs Lab 05/09/15 1152  05/10/15 0525 05/13/15 0534  WBC 9.1  --  12.7* 9.2  NEUTROABS 4.6  --  8.1*  --   HGB 15.7*  < > 12.2 10.6*  HCT 47.1*  < > 36.3 32.6*  MCV 94.8  --  95.5 94.8  PLT 244  --  217 188  < > = values in this interval not displayed.  Basic Metabolic Panel:   Recent Labs Lab 05/10/15 0525 05/13/15 0534  NA 138 146*  K 3.6 3.2*  CL 110 115*  CO2 20* 20*  GLUCOSE 111* 95  BUN 6 8  CREATININE 0.85 0.80  CALCIUM 7.4* 8.2*  MG 1.4* 1.9  PHOS 3.7 3.1    Lipid Panel:     Component Value Date/Time   CHOL 144 05/10/2015 0525   TRIG 88 05/12/2015 0520   HDL 29* 05/10/2015 0525   CHOLHDL 5.0 05/10/2015 0525   VLDL 33 05/10/2015 0525   LDLCALC 82 05/10/2015 0525    HgbA1c:  Lab Results  Component Value Date   HGBA1C 5.7* 05/10/2015   Urine Drug Screen:     Component Value Date/Time   LABOPIA NONE DETECTED 05/09/2015 1224   COCAINSCRNUR NONE DETECTED 05/09/2015 1224   LABBENZ NONE DETECTED 05/09/2015 1224   AMPHETMU NONE DETECTED 05/09/2015 1224   THCU NONE DETECTED 05/09/2015 1224   LABBARB NONE DETECTED 05/09/2015 1224      IMAGING  Ct Angio Head and Neck W/cm &/or Wo Cm 05/09/2015   Acute thrombus left M2 segment supplying the left temporal lobe. Occlusion of the proximal left internal carotid carotid artery which is presumably acute. 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 This may be chronic. There is reconstitution of the right internal carotid artery in the supraclinoid segment. Right anterior and middle cerebral arteries are patent bilaterally. Endotracheal tube right main bronchus.  Recommend withdrawal 3 cm.     Ct Head Wo Contrast 05/09/2015   Contrast opacified acute left subarachnoid bleed as described above.     Ct Head Wo Contrast 05/09/2015   Increased attenuation in the left middle cerebral artery compared to the right side. This finding is  concerning for early acute infarct in the left middle cerebral artery distribution. Somewhat unusual appearing area of decreased attenuation in the medial left frontal lobe. This area may warrant pre and post intravenous contrast brain MRI to assess for possible atypical mass in this area. Atypical infarct in this area is possible, but the distribution and appearance is somewhat unusual for infarct.     [name] Contrast 05/10/2015   Subarachnoid hemorrhage on the left. Left frontal hematoma likely related to hemorrhagic infarction. Acute infarct in the left caudate and putamen. Scattered small areas of acute infarct involving the left gyrus rectus and insular cortex. Scattered small areas of acute infarct in the left  frontal parietal cortex and in the right frontal cortex. The right internal carotid artery remains occluded. Left internal carotid artery now appears patent through the cavernous segment.     Ct Cerebral Perfusion W/cm 05/09/2015   Core infarct in the left basal ganglia and frontal temporal lobe. There is an area of penumbra which is larger than the core infarct involves the left temporal and frontal lobe. Less than 1/3 of MCA territory involved.     Dg Chest Port 1 View 05/10/2015   1. Endotracheal tube tip is 5 cm above the carina.  2. Possible trace left pleural effusion.  3. Decreased right mid lung opacity, likely decreased atelectasis.  4. Stable left lung base opacity, either atelectasis or pneumonia.    Portable Chest Xray 05/09/2015   1. Well-positioned support structures as described.  2. New hazy opacity in the right mid lung, probably atelectasis, cannot exclude mild pulmonary edema or aspiration.  3. Increased left basilar opacity, probably atelectasis.    Dg Chest Portable 1 View 05/09/2015   1. The ET tube is low lying but does not extend into either mainstem bronchus. Recommend withdrawing 1 or 1.5 cm.  2. Mild interstitial prominence may be due to technique. Recommend attention on follow-up    Dg Abd Portable 1v 05/09/2015   Orogastric tube tip in the mid stomach.    Dg Abd Portable 1v 05/09/2015   Enteric tube terminates in the gastric fundus. Nonobstructive bowel gas pattern.     CEREBRAL ANGIOGRAM [id number] (Custom)]      Expand All Collapse All   S/P innominate artery angiogram,Lt common carotid arteriogram,Lt Vert angiogram,followed by complete revascularization of occluded sup division Lt ICA using 10 mg of superselective intracranial tpa and x 1 pass with Solitaire 74mm x 91mm retrieval device ,and complete revascularization of symptomatic occluded LT ICA prox with stentassisted angioplasty and rescue fibrinilysis of intrastent thrombus with 20 mg Of  IA Integrelin ,5mg  of IA TPA and 6 mg of IA Reopro    Carotid dopplers : 05/13/15 :Left carotid stent appears patent, however 50-75% stenosis noted in the left proximal ICA, based on stent criteria. Calcific plaque is noted with acoustic shadowing, therefore higher velocities may not be ruled out. Also, >50% left ECA stenosis   PHYSICAL EXAM 65 year old Caucasian female. Afebrile. Head is nontraumatic. Neck is supple without bruit. Cardiac exam no murmur or gallop.  Skin is warm and distally well perfused. Neurological Exam :Awake laert Does   follow commands  Abulic. Expressive greater than receptive aphasia. Pupils equally round and reactive to light. Conjugate midline gaze. Intact cough and gag. Intact oculocephalic reflex.Mild right lower facial symmetry  Mild right hemiparesis. 2/5 RUE and 4/5 RLE strength but effort poor and variable.. Withdraws bilaterally in the lowers but not the uppers left more than  right side. Right toe is up and left toe is down.    ASSESSMENT/PLAN Ms. Merisha Horgen is a 65 y.o. female with no significant past medical history presenting with unresponsiveness and aphasia.  She received IV TPA  at 1205 on 02/08/2016 with subsequent hemorrhage.   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  MRI  Subarachnoid hemorrhage on the left. Left frontal hematoma likely related to hemorrhagic infarction versus post TPA hemorrhage.Acute infarct in the left caudate and putamen. Scattered small areas of acute infarct involving the left gyrus rectus and insular cortex. Scattered small areas of acute infarct in the left frontal parietal cortex and in the right frontal cortex. The right internal carotid artery remains occluded. Left internal carotid artery now appears patent through the cavernous segment.     MRA  not performed  Carotid Doppler refer  to CTA of the neck  CTA - see above 2D Echo   Left ventricle: The cavity size was normal. Wall thickness was  increased in a pattern of mild LVH. Systolic function was normal.  The estimated ejection fraction was in the range of 55% to 60%.  Wall motion was normal; there were no regional wall motion   abnormalities  LDL 82  HgbA1c 5.7  VTE prophylaxis - SCDs DIET DYS 2 Room service appropriate?: Yes; Fluid consistency:: Nectar Thick  No antithrombotic prior to admission, now on aspirin 325 milligrams daily with Plavix 75 mg daily (NPO)  Ongoing aggressive stroke risk factor management  Therapy recommendations:  Pending  Disposition:  Pending  Hypertension  Blood pressure running low  Permissive hypertension (OK if < 220/120) but gradually normalize in 5-7 days. Due to interventional procedure, blood pressure is recommended 0000000 systolic.   Hyperlipidemia  Home meds:   No lipid lowering medications prior to admission  LDL 82, goal < 70  Add low-dose Lipitor when taking POs  Continue statin at discharge   Other Stroke Risk Factors  Advanced age  Obesity, Body mass index is 30.22 kg/(m^2).    Other Active Problems  Mild leukocytosis  VDRF  Hyportension  Suspected aspiration pneumonia   Hospital day # 4  This is a 66 year old female with a chronically occluded right carotid artery who was found aphasic and unresponsive.  IV TPA was administered. Following this she was taken for stat CT angiogram of the head and neck with perfusion study which showed a chronic right ICA occlusion and possible acute left ICA occlusion. She was taken prior thrombectomy procedure was complete revascularization of occluded superior division left ICA. CT of the brain following showed a left medial frontal intraparenchymal hemorrhage with left frontal subarachnoid hemorrhage. She was transported to the neuro ICU. Follow-up MRI of the brain showed that the right internal  carotid artery remains occluded. Left internal carotid artery now appears patent through the cavernous segment.   This patient is critically ill and at significant risk of neurological worsening, death and care requires constant monitoring of vital signs, hemodynamics,respiratory and cardiac monitoring,review of multiple databases, neurological assessment, discussion with family, other specialists and medical decision making of high complexity.I I spent 30 minutes of neurocritical care time in the care of this patient. Plan  . Mobilize out of bed. Transfer to floor bed and rehabilitation in the next few days I had a long discussion with the patient's husband and son at the bedside and answered questions about her  prognosis, stroke evaluation and answered questions. Antony Contras, MD [facility] Pager: [phone]       To contact Stroke Continuity provider, please refer to [email or web address] After hours, contact General Neurology

## 2015-05-14 ENCOUNTER — Encounter (HOSPITAL_COMMUNITY): Payer: Self-pay

## 2015-05-14 ENCOUNTER — Inpatient Hospital Stay (HOSPITAL_COMMUNITY): Payer: Medicaid Other | Admitting: Speech Pathology

## 2015-05-14 ENCOUNTER — Inpatient Hospital Stay (HOSPITAL_COMMUNITY): Payer: Medicaid Other

## 2015-05-14 ENCOUNTER — Inpatient Hospital Stay (HOSPITAL_COMMUNITY): Payer: Self-pay | Admitting: Occupational Therapy

## 2015-05-14 ENCOUNTER — Inpatient Hospital Stay (HOSPITAL_COMMUNITY): Payer: Self-pay | Admitting: Physical Therapy

## 2015-05-14 LAB — DIFFERENTIAL
Basophils Absolute: 0.1 10*3/uL (ref 0.0–0.1)
Basophils Relative: 1 %
Eosinophils Absolute: 0.2 10*3/uL (ref 0.0–0.7)
Eosinophils Relative: 2 %
Lymphocytes Relative: 23 %
Lymphs Abs: 1.9 10*3/uL (ref 0.7–4.0)
Monocytes Absolute: 0.6 10*3/uL (ref 0.1–1.0)
Monocytes Relative: 7 %
Neutro Abs: 5.5 10*3/uL (ref 1.7–7.7)
Neutrophils Relative %: 67 %

## 2015-05-14 LAB — COMPREHENSIVE METABOLIC PANEL
ALT: 10 U/L — ABNORMAL LOW (ref 14–54)
AST: 14 U/L — ABNORMAL LOW (ref 15–41)
Albumin: 2.5 g/dL — ABNORMAL LOW (ref 3.5–5.0)
Alkaline Phosphatase: 55 U/L (ref 38–126)
Anion gap: 8 (ref 5–15)
BUN: 5 mg/dL — ABNORMAL LOW (ref 6–20)
CO2: 24 mmol/L (ref 22–32)
Calcium: 8.3 mg/dL — ABNORMAL LOW (ref 8.9–10.3)
Chloride: 114 mmol/L — ABNORMAL HIGH (ref 101–111)
Creatinine, Ser: 0.83 mg/dL (ref 0.44–1.00)
GFR calc Af Amer: >60 mL/min (ref >60)
GFR calc non Af Amer: >60 mL/min (ref >60)
Glucose, Bld: 99 mg/dL (ref 65–99)
Potassium: 3.2 mmol/L — ABNORMAL LOW (ref 3.5–5.1)
Sodium: 146 mmol/L — ABNORMAL HIGH (ref 135–145)
Total Bilirubin: 0.9 mg/dL (ref 0.3–1.2)
Total Protein: 5.2 g/dL — ABNORMAL LOW (ref 6.5–8.1)

## 2015-05-14 LAB — CBC
HCT: 32.5 % — ABNORMAL LOW (ref 36.0–46.0)
Hemoglobin: 11 g/dL — ABNORMAL LOW (ref 12.0–15.0)
MCH: 32.3 pg (ref 26.0–34.0)
MCHC: 33.8 g/dL (ref 30.0–36.0)
MCV: 95.3 fL (ref 78.0–100.0)
Platelets: 201 10*3/uL (ref 150–400)
RBC: 3.41 MIL/uL — ABNORMAL LOW (ref 3.87–5.11)
RDW: 14.2 % (ref 11.5–15.5)
WBC: 8.2 10*3/uL (ref 4.0–10.5)

## 2015-05-14 LAB — MAGNESIUM: Magnesium: 1.8 mg/dL (ref 1.7–2.4)

## 2015-05-14 LAB — GLUCOSE, CAPILLARY: Glucose-Capillary: 97 mg/dL (ref 65–99)

## 2015-05-14 LAB — BRAIN NATRIURETIC PEPTIDE: B Natriuretic Peptide: 391.6 pg/mL — ABNORMAL HIGH (ref 0.0–100.0)

## 2015-05-14 MED ORDER — SODIUM CHLORIDE 0.9 % IV SOLN
3.0000 g | Freq: Four times a day (QID) | INTRAVENOUS | Status: AC
  Administered 2015-05-14 – 2015-05-15 (×7): 3 g via INTRAVENOUS
  Filled 2015-05-14 (×7): qty 3

## 2015-05-14 MED ORDER — SODIUM CHLORIDE 0.9% FLUSH
10.0000 mL | INTRAVENOUS | Status: DC | PRN
  Administered 2015-05-14 – 2015-05-15 (×2): 20 mL
  Administered 2015-05-15 (×2): 10 mL
  Filled 2015-05-14 (×4): qty 40

## 2015-05-14 MED ORDER — POTASSIUM CHLORIDE CRYS ER 20 MEQ PO TBCR
20.0000 meq | EXTENDED_RELEASE_TABLET | Freq: Two times a day (BID) | ORAL | Status: DC
  Administered 2015-05-14 – 2015-05-21 (×15): 20 meq via ORAL
  Filled 2015-05-14 (×15): qty 1

## 2015-05-14 MED ORDER — FUROSEMIDE 10 MG/ML IJ SOLN
40.0000 mg | Freq: Two times a day (BID) | INTRAMUSCULAR | Status: AC
  Administered 2015-05-14 (×2): 40 mg via INTRAVENOUS
  Filled 2015-05-14 (×2): qty 4

## 2015-05-14 MED ORDER — VANCOMYCIN HCL IN DEXTROSE 1-5 GM/200ML-% IV SOLN
1000.0000 mg | Freq: Three times a day (TID) | INTRAVENOUS | Status: DC
  Administered 2015-05-14 (×2): 1000 mg via INTRAVENOUS
  Filled 2015-05-14 (×3): qty 200

## 2015-05-14 NOTE — Evaluation (Signed)
Physical Therapy Assessment and Plan  Patient Details  Name: Monica Pugh MRN: [id number] Date of Birth: [birth year]  PT Diagnosis: Abnormality of gait, Coordination disorder, Hemiparesis R side, and Impaired cognition Rehab Potential: Good ELOS: 14 days   Today's Date: 05/14/2015 PT Individual Time: 1003-1103 PT Individual Time Calculation (min): 60 min    Problem List:  Patient Active Problem List   Diagnosis Date Noted  . Hemiparesis, aphasia, and dysphagia as late effect of cerebrovascular accident (CVA) ([place])   . Aspiration pneumonia of right middle lobe due to vomit ([place])   . Tobacco abuse   . Edema   . Internal carotid artery stenosis   . Acute ischemic left MCA stroke ([place])   . Cytotoxic brain edema ([place]) 05/11/2015  . Arterial ischemic stroke, MCA (middle cerebral artery), left, acute ([place]) 05/09/2015    Past Medical History: History reviewed. No pertinent past medical history. Past Surgical History:  Past Surgical History  Procedure Laterality Date  . Tubal ligation    . Radiology with anesthesia N/A 05/09/2015    Procedure: RADIOLOGY WITH ANESTHESIA;  Surgeon: Luanne Bras, MD;  Location: [place];  Service: Radiology;  Laterality: N/A;    Assessment & Plan Clinical Impression: Monica Pugh is a 65 y.o. female in relatively good health who was admitted on 05/09/15 with left facial droop, difficulty handling secretions, difficulty speaking and unresponsiveness. CT head negative and she was transferred to [facility] for treatment. She was treated with IV tPA and intubated for airway protection.  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 Right ICA remained  occluded and left ICA now patent. 2D echo with EF 55-60% with grade 1 diastolic dysfunction and mild LAE. She had difficulty with wean wean with vomiting and possible RML aspiration PNA. She was started on Unasyn for treatment and tolerated extubation on 03/20.  Patient on ASA/Plavix for secondary stroke prevention. 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  Therapy evaluations done yesterday and patient limited by right hemiparesis, apraxia, question of visual deficits and difficulty following commands.  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 CIR recommended for follow up therapy and patient admitted today.  Patient transferred to CIR on 05/13/2015 .   Patient currently requires min with mobility secondary to muscle weakness, decreased cardiorespiratoy endurance, unbalanced muscle activation, decreased coordination and decreased motor planning, decreased attention, decreased awareness and decreased problem solving and decreased sitting balance, decreased standing balance, decreased postural control and decreased balance strategies.  Prior to hospitalization, patient was independent  with mobility and lived with Spouse in a House home.  Home access is  Level entry.  Patient will benefit from skilled PT intervention to maximize safe functional mobility, minimize fall risk and decrease caregiver burden for planned discharge home with 24 hour assist.  Anticipate patient will benefit from follow up [facility] at discharge.  PT - End of Session Activity Tolerance: Tolerates 30+ min activity with multiple rests Endurance Deficit: Yes Endurance Deficit Description: cardiopulmonary endurance deficit PT Assessment Rehab Potential (ACUTE/IP ONLY): Good Barriers to Discharge: Other (comment) Barriers to Discharge Comments: language deficits PT Patient demonstrates impairments in the following area(s):  Balance;Behavior;Endurance;Motor;Perception;Safety  PT Transfers Functional Problem(s): Bed Mobility;Bed to Chair;Car;Furniture PT Locomotion Functional Problem(s): Ambulation;Stairs PT Plan PT Intensity: Minimum of 1-2 x/day ,45 to 90 minutes PT Frequency: 5 out of 7 days PT Duration Estimated Length of Stay: 14 days PT Treatment/Interventions: Ambulation/gait training;Pain management;Stair training;Wheelchair propulsion/positioning;Therapeutic Activities;Patient/family education;DME/adaptive equipment instruction;Balance/vestibular training;Psychosocial support;Therapeutic Exercise;Cognitive remediation/compensation;Community reintegration;Functional mobility training;UE/LE Strength taining/ROM;UE/LE Coordination activities;Neuromuscular re-education;Discharge planning;Splinting/orthotics PT Transfers Anticipated Outcome(s): supervision PT Locomotion Anticipated Outcome(s): supervision PT Recommendation Follow Up Recommendations: Home health PT;24 hour supervision/assistance Patient destination: Home Equipment Recommended: To be determined  Skilled Therapeutic Intervention Pt received resting in w/c and agreeable to therapy session, no c/o pain.  PT provided pt and fam education on role of PT, goals of therapy, and plan of care.  Pt propelled w/c with mod assist and max multimodal cues for attention to RUE.  Gait training x50' with RW and min assist with therapist supporting R hand on RW and mod multimodal cues for weight shift, advancing LEs, and step length.  Stair negotiation x3 steps with 2 hand rails and mod assist for facilitating weight shift and advancing LEs.  Pt limited to 3 steps by IV in LLE, catheter and short O2 tubing.  Car transfer with min assist.  Pt returned to room in w/c at end of session and positioned with family present, call bell in reach, and needs met.   PT Evaluation Precautions/Restrictions Precautions Precautions: Fall Precaution Comments: R hemiparesis  Home  Living/Prior Functioning Home Living Available Help at Discharge: Family;Available 24 hours/day (husband, Chrissie Noa, and daughter (retired care giver)) Type of Home: House Home Access: Level entry [place]: One level Bathroom Accessibility: Yes (husband not sure whether bathroom is w/c accessible) Additional Comments: pt retired   Lives With: Spouse Prior Function Level of Independence: Independent with gait;Independent with transfers  Able to Take Stairs?: Yes Driving: Yes Vocation: Retired Comments: going back to work following retirement to have something to do   Cognition Overall Cognitive Status: Impaired/Different from baseline Arousal/Alertness: Awake/alert Orientation Level: Development worker, international aid (comment) (didn't get last name Marcello Moores) or year (1968)) Attention: Focused;Sustained;Selective Focused Attention: Appears intact Sustained Attention: Impaired Sustained Attention Impairment: Verbal basic;Functional basic Sensation Sensation Light Touch: Appears Intact (pt reports intact sensation and correctly identifies R vs L leg, but unsure how accurate given cognitive/language deficits) Coordination Gross Motor Movements are Fluid and Coordinated: Yes Fine Motor Movements are Fluid and Coordinated: No Motor  Motor Motor: Other (comment) Motor - Skilled Clinical Observations: R hemiparesis, impaired motor planning  Mobility Transfers Transfers: Yes Sit to Stand: 4: Min assist Sit to Stand Details: Verbal cues for technique;Manual facilitation for weight shifting;Verbal cues for safe use of DME/AE Stand to Sit: 4: Min assist Stand to Sit Details (indicate cue type and reason): Verbal cues for technique;Verbal cues for safe use of DME/AE;Manual facilitation for weight shifting Stand Pivot Transfers: 4: Min assist Stand Pivot Transfer Details: Verbal cues for technique;Manual facilitation for weight shifting Locomotion  Ambulation Ambulation: Yes Ambulation/Gait  Assistance: 4: Min assist Ambulation Distance (Feet): 50 Feet Assistive device: Rolling walker Ambulation/Gait Assistance Details: Verbal cues for safe use of DME/AE;Verbal cues for gait pattern;Manual facilitation for weight shifting;Manual facilitation for placement;Verbal cues for technique Ambulation/Gait Assistance Details: decreased Gait Gait: Yes Gait Pattern: Impaired Gait Pattern: Step-to pattern;Poor foot clearance - right;Decreased hip/knee flexion - right;Decreased step length - left Gait velocity: very decreased Stairs / Additional Locomotion Stairs: Yes Stairs Assistance: 3: Mod assist Stairs Assistance Details: Verbal cues for gait pattern;Verbal cues for safe use of  DME/AE;Manual facilitation for weight shifting;Manual facilitation for placement Stair Management Technique: Two rails Number of Stairs: 3 Height of Stairs: 3 Wheelchair Mobility Wheelchair Mobility: Yes Wheelchair Assistance: 3: Mod assist Wheelchair Assistance Details: Verbal cues for safe use of DME/AE;Manual facilitation for placement Wheelchair Propulsion: Both upper extremities Wheelchair Parts Management: Needs assistance Distance: 50  Trunk/Postural Assessment  Cervical Assessment Cervical Assessment:  (forward head) Thoracic Assessment Thoracic Assessment:  (rounded shoulders, sits with flexed posture, able to correct with cues) Lumbar Assessment Lumbar Assessment:  (decreased lumbar lordosis in resting position)  Balance Balance Balance Assessed: Yes Static Sitting Balance Static Sitting - Balance Support: Right upper extremity supported;Left upper extremity supported Static Sitting - Level of Assistance: 5: Stand by assistance Static Standing Balance Static Standing - Balance Support: Right upper extremity supported;Left upper extremity supported Static Standing - Level of Assistance: 4: Min assist Extremity Assessment      RLE AROM (degrees) RLE Overall AROM Comments: WFL  RLE  Strength RLE Overall Strength Comments: hip flexion 3/5, knee extension 3+/5, knee flexion 3+/5, DF/PF 3/5 (? effort vs attention deficits vs weakness) LLE Assessment LLE Assessment: Exceptions to WFL LLE AROM (degrees) LLE Overall AROM Comments: WFL LLE Strength LLE Overall Strength Comments: hip flexion 3/5, knee flexion 3+/5, knee extension 3+/5, DF/PF 3/5 (? effort vs attention deficits vs weakness)   See Function Navigator for Current Functional Status.   Refer to Care Plan for Long Term Goals  Recommendations for other services: None  Discharge Criteria: Patient will be discharged from PT if patient refuses treatment 3 consecutive times without medical reason, if treatment goals not met, if there is a change in medical status, if patient makes no progress towards goals or if patient is discharged from hospital.  The above assessment, treatment plan, treatment alternatives and goals were discussed and mutually agreed upon: by patient and by family  Evie Lacks 05/14/2015, 12:29 PM

## 2015-05-14 NOTE — Progress Notes (Signed)
65 y.o. female in relatively good health who was admitted on 05/09/15 with left facial droop, difficulty handling secretions, difficulty speaking and unresponsiveness. History taken from chart review. CT head negative and she was transferred to [facility] for treatment. She was treated with IV tPA and intubated for airway protection. 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 Right ICA remained occluded and left ICA now patent. 2D echo with EF 55-60% with grade 1 diastolic dysfunction and mild LAE. She had difficulty with vent wean with vomiting and possible RML aspiration PNA. She was started on Unasyn for treatment and tolerated extubation on 03/20.  Patient on ASA/Plavix for secondary stroke prevention.  Subjective/Complaints: Husband notes increasing cough. Remains afebrile Pt aphasic/apraxic  and HOH  Objective: Vital Signs: Blood pressure 157/62, pulse 72, temperature 98.6 F (37 C), temperature source Oral, resp. rate 18, height '5\' 6"'  (1.676 m), weight 87 kg (191 lb 12.8 oz), SpO2 99 %. Dg Chest Port 1 View  05/13/2015  CLINICAL DATA:  Acute onset shortness of breath this evening. Stroke on 03/18. Smoker. EXAM: PORTABLE CHEST 1 VIEW COMPARISON:  05/10/2015 FINDINGS: Interval removal of endotracheal and enteric tubes. Vascular stent demonstrated in the base of the neck on the left. Normal heart size and pulmonary vascularity for technique. There is interval development of infiltration in the right lung base suggesting developing pneumonia or atelectasis. Left lung is clear. No blunting of costophrenic angles. No pneumothorax. Calcification of the aorta. Mediastinal contours appear  intact. IMPRESSION: Developing infiltration or atelectasis in the right lung base. Electronically Signed   By: Lucienne Capers M.D.   On: 05/13/2015 22:45   Results for orders placed or performed during the hospital encounter of 05/13/15 (from the past 72 hour(s))  Glucose, capillary     Status: None   Collection Time: 05/13/15 11:08 PM  Result Value Ref Range   Glucose-Capillary 85 65 - 99 mg/dL  Glucose, capillary     Status: None   Collection Time: 05/14/15  3:59 AM  Result Value Ref Range   Glucose-Capillary 97 65 - 99 mg/dL  Comprehensive metabolic panel     Status: Abnormal   Collection Time: 05/14/15  5:30 AM  Result Value Ref Range   Sodium 146 (H) 135 - 145 mmol/L   Potassium 3.2 (L) 3.5 - 5.1 mmol/L   Chloride 114 (H) 101 - 111 mmol/L   CO2 24 22 - 32 mmol/L   Glucose, Bld 99 65 - 99 mg/dL   BUN 5 (L) 6 - 20 mg/dL   Creatinine, Ser 0.83 0.44 - 1.00 mg/dL   Calcium 8.3 (L) 8.9 - 10.3 mg/dL   Total Protein 5.2 (L) 6.5 - 8.1 g/dL   Albumin 2.5 (L) 3.5 - 5.0 g/dL   AST 14 (L) 15 - 41 U/L   ALT 10 (L) 14 - 54 U/L   Alkaline Phosphatase 55 38 - 126 U/L   Total Bilirubin 0.9 0.3 - 1.2 mg/dL   GFR calc non Af Amer >60 >60 mL/min   GFR calc Af Amer >60 >60 mL/min    Comment: (NOTE) The eGFR has been calculated using the CKD EPI equation. This calculation has not been validated in all clinical situations. eGFR's persistently <  60 mL/min signify possible Chronic Kidney Disease.    Anion gap 8 5 - 15  Magnesium     Status: None   Collection Time: 05/14/15  5:30 AM  Result Value Ref Range   Magnesium 1.8 1.7 - 2.4 mg/dL      General: No acute distress Mood and affect are appropriate Heart: Regular rate and rhythm no rubs murmurs or extra sounds Lungs: Clear to auscultation, breathing unlabored, no rales or wheezes Abdomen: Positive bowel sounds, soft nontender to palpation, nondistended Extremities: No clubbing, cyanosis, or edema Skin: No evidence of breakdown, no  evidence of rash Neurologic: Cranial nerves II through XII intact, motor strength is 5/5 in bilateral deltoid, bicep, tricep, grip, hip flexor, knee extensors, ankle dorsiflexor and plantar flexor Sensory exam normal sensation to light touch and proprioception in bilateral upper and lower extremities Cerebellar exam normal finger to nose to finger as well as heel to shin in bilateral upper and lower extremities Musculoskeletal: Full range of motion in all 4 extremities. No joint swelling  Assessment/Plan: 1. 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 Physiatrist is providing close team supervision and 24 hour management of active medical problems listed below. Physiatrist and rehab team continue to assess barriers to discharge/monitor patient progress toward functional and medical goals. FIM:                                 Medical Problem List and Plan: 1.  Dysphagia, aphasia, weakness secondary to left frontal SAH and acute infarct left caudate, putamen and small areas of infarct left gurus and insular cortex- initiate therapy 2.  DVT Prophylaxis/Anticoagulation: Mechanical: Sequential compression devices, below knee Bilateral lower extremities 3. Pain Management: tylenol prn for pain.   4. Mood: LCSW for evaluation and support.   5. Neuropsych: This patient is not capable of making decisions on her own behalf. 6. Skin/Wound Care: routine pressure relief measures 7. Fluids/Electrolytes/Nutrition: Monitor I/O. Check lytes in am.K is low add KCL 8. Aspiration PNA:  Continue Unasyn. Check follow up  , afebrile 9. Dysphagia: Strict aspiration precautions.   10  Tobacco abuse: Question COPD/OSA. Continue oxygen for now and wean as  able.   11. Left ICA stent with thrombolysis: continue ASA/plavix and follow up with IVR in 6 weeks 12.  Hypokalemia- reviewed labs, add supplement 13.  Increasing pulmonary edema, reviewed ECHO diastolic dysfunction not systolic, will add lasix po, reviewed xray LOS (Days) 1 A FACE TO FACE EVALUATION WAS PERFORMED  KIRSTEINS,ANDREW E 05/14/2015, 7:51 AM

## 2015-05-14 NOTE — Progress Notes (Signed)
Portable chest xray results were report to [place] she had already seen the results new diet order NPO and Vancomycin per pharmacy placed and given blood pressure meds ordered. Pt given Incentive Spirometer and demonstrated how to use.  Will continue to monitor. Arthor Captain LPN

## 2015-05-14 NOTE — Progress Notes (Signed)
Pharmacy Antibiotic Note  Monica Pugh is a 65 y.o. female admitted to rehab on 05/13/2015 after being hospitalized for stroke. Pt now with SOB.  Pharmacy has been consulted for vancomycin dosing for PNA seen on CXR. Pt also on Unasyn (Day #7) for probable asp pna.  Plan: Vancomycin 1 gm IV q8h Will f/u micro data, renal function, and pt's clinical condition Vanc trough prn  Height: 5\' 6"  (167.6 cm) Weight: 191 lb 12.8 oz (87 kg) IBW/kg (Calculated) : 59.3  Temp (24hrs), Avg:99.3 F (37.4 C), Min:98.8 F (37.1 C), Max:100.3 F (37.9 C)   Recent Labs Lab 05/09/15 1152 05/09/15 1201 05/10/15 0525 05/13/15 0534  WBC 9.1  --  12.7* 9.2  CREATININE 0.85 0.90 0.85 0.80    Estimated Creatinine Clearance: 80 mL/min (by C-G formula based on Cr of 0.8).    No Known Allergies  Antimicrobials this admission: 3/18 Unasyn >>  3/22 Vanc >>   Dose adjustments this admission: n/a  Microbiology results: 3/23 BCx:  3/18 Trach asp: neg  Thank you for allowing pharmacy to be a part of this patient's care.  Sherlon Handing, PharmD, BCPS Clinical pharmacist, pager [phone] 05/14/2015 12:16 AM

## 2015-05-14 NOTE — Evaluation (Signed)
Occupational Therapy Assessment and Plan  Patient Details  Name: Monica Pugh MRN: [id number] Date of Birth: April 08, 1950  OT Diagnosis: apraxia, cognitive deficits, hemiplegia affecting dominant side, muscle weakness (generalized) and swelling of limb Rehab Potential: Rehab Potential (ACUTE ONLY): Good ELOS: 12-14 days   Today's Date: 05/14/2015 OT Individual Time: 0900-1000 OT Individual Time Calculation (min): 60 min     Problem List:  Patient Active Problem List   Diagnosis Date Noted  . Hemiparesis, aphasia, and dysphagia as late effect of cerebrovascular accident (CVA) ([place])   . Aspiration pneumonia of right middle lobe due to vomit ([place])   . Tobacco abuse   . Edema   . Internal carotid artery stenosis   . Acute ischemic left MCA stroke ([place])   . Cytotoxic brain edema ([place]) 05/11/2015  . Arterial ischemic stroke, MCA (middle cerebral artery), left, acute ([place]) 05/09/2015    Past Medical History: History reviewed. No pertinent past medical history. Past Surgical History:  Past Surgical History  Procedure Laterality Date  . Tubal ligation    . Radiology with anesthesia N/A 05/09/2015    Procedure: RADIOLOGY WITH ANESTHESIA;  Surgeon: Luanne Bras, MD;  Location: [facility];  Service: Radiology;  Laterality: N/A;    Assessment & Plan Clinical Impression: Patient is a 65 y.o. year old female  in relatively good health who was admitted on 05/09/15 with left facial droop, difficulty handling secretions, difficulty speaking and unresponsiveness. History taken from chart review. CT head negative and she was transferred to [facility] for treatment. She was treated with IV tPA and intubated for airway protection. 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 MRI brain 3/19 showed left frontal SAH likely related to hemorrhagic infarct, acute infarct  left caudate and putamen and small areas of infarct left gurus and insular cortex. Right ICA remained occluded and left ICA now patent. 2D echo with EF 55-60% with grade 1 diastolic dysfunction and mild LAE. She had difficulty with vent wean with vomiting and possible RML aspiration PNA. She was started on Unasyn for treatment and tolerated extubation on 03/20.  Patient on ASA/Plavix for secondary stroke prevention. 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 swallow evaluation done and she was placed on dysphagia 2, thin liquids due to neurogenic dysphagia. Therapy evaluations done and patient limited by right hemiparesis, apraxia, question of visual deficits and difficulty following commands. 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  Patient transferred to CIR on 05/13/2015 .    Patient currently requires mod A for basic ADL tasks (without regular clothes) and min A for basic mobility with extra time secondary to muscle weakness, decreased cardiorespiratoy endurance, decreased oxygen support and all over body edema, impaired timing and sequencing, unbalanced muscle activation, motor apraxia, decreased coordination and decreased motor planning, decreased motor planning and ideational apraxia, decreased initiation, decreased attention, decreased awareness, decreased problem solving and delayed processing and decreased standing balance, decreased postural control, hemiplegia, decreased balance strategies and difficulty maintaining precautions.  Prior to hospitalization, patient could complete ADL with independent .  Patient will benefit from skilled intervention to decrease level of assist with basic self-care skills and increase independence with basic self-care skills prior to discharge home with care partner.  Anticipate patient will require 24 hour supervision and A for IADLs and follow up  outpatient.  OT - End of Session Activity Tolerance: Tolerates 10 - 20 min activity with multiple rests Endurance Deficit: Yes Endurance Deficit Description: cardiopulmonary endurance deficit OT Assessment Rehab Potential (ACUTE ONLY): Good OT Patient demonstrates impairments in the following area(s): Balance;Cognition;Edema;Endurance;Motor;Nutrition;Pain;Perception;Safety;Sensory;Skin Integrity OT Basic ADL's Functional Problem(s): Eating;Grooming;Bathing;Dressing;Toileting OT Transfers Functional Problem(s): Toilet;Tub/Shower OT Additional Impairment(s): Fuctional Use of Upper Extremity OT Plan OT Intensity: Minimum of 1-2 x/day, 45 to 90 minutes OT Frequency: 5 out of 7 days OT Duration/Estimated Length of Stay: 12-14 days OT Treatment/Interventions: Cognitive remediation/compensation;Balance/vestibular training;Community reintegration;Discharge planning;DME/adaptive equipment instruction;Disease mangement/prevention;Functional electrical stimulation;Neuromuscular re-education;Patient/family education;Self Care/advanced ADL retraining;Splinting/orthotics;Therapeutic Exercise;UE/LE Coordination activities;UE/LE Strength taining/ROM;Therapeutic Activities;Skin care/wound managment;Psychosocial support;Pain management;Functional mobility training;Visual/perceptual remediation/compensation OT Self Feeding Anticipated Outcome(s): supervision OT Basic Self-Care Anticipated Outcome(s): supervision OT Toileting Anticipated Outcome(s): supervision OT Bathroom Transfers Anticipated Outcome(s): supervision OT Recommendation Patient destination: Home Follow Up Recommendations: Outpatient OT Equipment Recommended: To be determined   Skilled Therapeutic Intervention Ot eval initiated with OT purpose, role and goals discussed with pt and pt's husband. Self care retraining at sink level.  Pt without clothes in session. Focus on initiation, sustained to selective attention, sit to stands, short  distance functional ambulation, basic stand pivot transfers, functional use of right UE, education for family and pt on edema control; following one step directions, functional problem solving; functional communication at the word level.  Pt's husband did tend to interject and answer for pt; education provided on purpose of allowing extra time for her to communicate verbally. Pt able to perform sit to stand with min A. Pt did require mod VC and environmental cues for sequencing and task organization throughout session. Pt able to ambulate to door of room with min to mod A without UE support and without AD. Pt able to maintain O2 sats >95% on 3 liters of O2.    2nd session: no reports of pain in session.  Applied Kinesotape to pt right forearm dorsal and ventral side of Ue to decr hand edema for increase success with holding items. Pt's U functioning at [facility] level IV but edema is impacting ability to move against gravity and grasp items. Continued education with family (husband and son) on goals of therapy and progress and NPO status.   OT Evaluation Precautions/Restrictions  Precautions Precautions: Fall Precaution Comments: R hemiparesis; apraxia, aphasia; central line in LE Restrictions Weight Bearing Restrictions: No General Chart Reviewed: Yes Family/Caregiver Present: Yes (husband)  Therapy Vitals Temp: 97.5 F (36.4 C) Temp Source: Oral Pulse Rate: 63 Resp: 18 BP: (!) 152/71 mmHg Patient Position (if appropriate): Sitting Oxygen Therapy SpO2: 100 % O2 Device: Nasal Cannula O2 Flow Rate (L/min): 2 L/min Pain  no reports of pain Home Living/Prior Functioning Home Living Family/patient expects to be discharged to:: Private residence Living Arrangements: Spouse/significant other Available Help at Discharge: Family, Available 24 hours/day Type of Home: House Home Access: Level entry Home Layout: One level Bathroom Shower/Tub: Tub/shower unit, Scientist, forensic: Yes Additional Comments: pt retired   Lives With: Spouse Prior Function Level of Independence: Independent with gait, Independent with transfers  Able to Take Stairs?: Yes Driving: Yes Vocation: Retired Comments: going back to work following retirement to have something to do ADL ADL ADL Comments: see functional navigator Vision/Perception  Vision- History Baseline Vision/History: No visual deficits Vision- Assessment Additional Comments: will continue to assess  Cognition Overall Cognitive Status: Impaired/Different from baseline Arousal/Alertness: Awake/alert Orientation Level: Person;Place;Situation Person: Oriented Place: Oriented Situation: Oriented Year: 2017 Month: March Day of Week:  Incorrect (reported Monday) Immediate Memory Recall:  (required max A to repeat words) Memory Recall:  (no recall verbally) Attention: Focused;Sustained;Selective Focused Attention: Appears intact Sustained Attention: Impaired Sustained Attention Impairment: Functional basic Selective Attention: Impaired Selective Attention Impairment: Functional basic Awareness: Impaired Awareness Impairment: Intellectual impairment;Emergent impairment Problem Solving: Impaired Problem Solving Impairment: Functional basic Executive Function: Sequencing;Organizing Sequencing: Impaired Sequencing Impairment: Functional basic Organizing: Impaired Organizing Impairment: Functional basic Safety/Judgment: Impaired Sensation Sensation Light Touch: Impaired by gross assessment Proprioception: Impaired by gross assessment Coordination Gross Motor Movements are Fluid and Coordinated: Yes Fine Motor Movements are Fluid and Coordinated: No Finger Nose Finger Test: delayed and with decr coordination Motor  Motor Motor: Hemiplegia Motor - Skilled Clinical Observations: R hemiparesis, impaired motor planning Mobility  Transfers Transfers: Sit to Stand;Stand to Sit Sit  to Stand: 4: Min assist Sit to Stand Details: Verbal cues for technique;Manual facilitation for weight shifting;Verbal cues for safe use of DME/AE Stand to Sit: 4: Min assist Stand to Sit Details (indicate cue type and reason): Verbal cues for technique;Verbal cues for safe use of DME/AE;Manual facilitation for weight shifting  Trunk/Postural Assessment  Cervical Assessment Cervical Assessment: Exceptions to [facility] (forward head posture) Thoracic Assessment Thoracic Assessment: Exceptions to [facility] (flexed forward) Lumbar Assessment Lumbar Assessment: Exceptions to [facility] (posterior pelvic tilt) Postural Control Postural Control: Deficits on evaluation Righting Reactions: delayed Protective Responses: delayed  Balance Balance Balance Assessed: Yes Static Sitting Balance Static Sitting - Balance Support: Right upper extremity supported;Left upper extremity supported Static Sitting - Level of Assistance: 5: Stand by assistance Dynamic Sitting Balance Sitting balance - Comments: min A initially for sitting EOB.  Progressed to min guard assist.  Able to reach with Lt and Rt UE  Static Standing Balance Static Standing - Balance Support: Right upper extremity supported;Left upper extremity supported Static Standing - Level of Assistance: 4: Min assist Dynamic Standing Balance Dynamic Standing - Balance Support: No upper extremity supported;During functional activity Dynamic Standing - Level of Assistance: 3: Mod assist;4: Min assist Extremity/Trunk Assessment RUE Assessment RUE Assessment: Exceptions to [facility] RUE AROM (degrees) RUE Overall AROM Comments: Brunstrom V; limited by edema LUE Assessment LUE Assessment: Within Functional Limits   See Function Navigator for Current Functional Status.   Refer to Care Plan for Long Term Goals  Recommendations for other services: None  Discharge Criteria: Patient will be discharged from OT if patient refuses treatment 3 consecutive times without  medical reason, if treatment goals not met, if there is a change in medical status, if patient makes no progress towards goals or if patient is discharged from hospital.  The above assessment, treatment plan, treatment alternatives and goals were discussed and mutually agreed upon: by patient and by family  Nicoletta Ba 05/14/2015, 3:26 PM

## 2015-05-14 NOTE — Evaluation (Signed)
Speech Language Pathology Assessment and Plan  Patient Details  Name: Monica Pugh MRN: [id number] Date of Birth: 1950-03-07  SLP Diagnosis: Aphasia;Cognitive Impairments;Speech and Language deficits;Dysphagia  Rehab Potential: Excellent ELOS: 12-14 days    Today's Date: 05/14/2015 SLP Individual Time: 1300-1400 SLP Individual Time Calculation (min): 60 min   Problem List:  Patient Active Problem List   Diagnosis Date Noted  . Hemiparesis, aphasia, and dysphagia as late effect of cerebrovascular accident (CVA) ([place])   . Aspiration pneumonia of right middle lobe due to vomit ([place])   . Tobacco abuse   . Edema   . Internal carotid artery stenosis   . Acute ischemic left MCA stroke ([place])   . Cytotoxic brain edema ([place]) 05/11/2015  . Arterial ischemic stroke, MCA (middle cerebral artery), left, acute ([place]) 05/09/2015   Past Medical History: History reviewed. No pertinent past medical history. Past Surgical History:  Past Surgical History  Procedure Laterality Date  . Tubal ligation    . Radiology with anesthesia N/A 05/09/2015    Procedure: RADIOLOGY WITH ANESTHESIA;  Surgeon: Luanne Bras, MD;  Location: [place];  Service: Radiology;  Laterality: N/A;    Assessment / Plan / Recommendation Clinical Impression     Monica Pugh is a 65 y.o. female in relatively good health who was admitted on 05/09/15 with left facial droop, difficulty handling secretions, difficulty speaking and unresponsiveness. History taken from chart review. CT head negative and she was transferred to [facility] for treatment. She was treated with IV tPA and intubated for airway protection. 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 MRI brain 3/19 showed left frontal SAH likely related to hemorrhagic infarct, acute infarct left caudate and putamen and small areas of infarct  left gurus and insular cortex. Right ICA remained occluded and left ICA now patent. 2D echo with EF 55-60% with grade 1 diastolic dysfunction and mild LAE. She had difficulty with vent wean with vomiting and possible RML aspiration PNA. She was started on Unasyn for treatment and tolerated extubation on 03/20.  Patient on ASA/Plavix for secondary stroke prevention. 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 swallow evaluation done and she was placed on dysphagia 2, thin liquids due to neurogenic dysphagia. Therapy evaluations done and patient limited by right hemiparesis, apraxia, question of visual deficits and difficulty following commands Pt presents with moderate expressive/receptive aphasia and moderate to severe oropharyngeal dysphagia. A modified barium swallow study will be completed next date to further characterize swallow function and determine the most appropriate diet consistency. Pt would benefit from SLP services at [facility] with emphasis on language and dysphagia. Further assessment of cognition may be warranted pending progress with speech and language production. Pt and family are motivated to participate in therapy to increase pt independence with communication and swallowing.   Skilled Therapeutic Interventions          Clinical swallow assessment at bedside was characterized by appearance of decreased pharyngeal coordination and intermittent overt s/s aspiration. Pt will be maintained NPO except for ice chips after oral care and meds crushed with puree. Pt is demonstrating simple expressive and receptive language skills with limited accuracy. Language is typically delivered at single word level. Word-finding limitations noted mostly in conversational speech setting- pt performance was improved with structured speech activities. Voice was hoarse with diminished vocal intensity however this could be secondary  to recent intubation.   SLP  Assessment  Patient will need skilled Speech Lanaguage Pathology Services during CIR admission    Recommendations  SLP Diet Recommendations: NPO (except for ice chips, meds crushed with puree) Medication Administration: Crushed with puree Oral Care Recommendations: Oral care BID;Oral care prior to ice chip/H20 Patient destination: Home Follow up Recommendations: Outpatient SLP;Home Health SLP Equipment Recommended: To be determined    SLP Frequency 3 to 5 out of 7 days   SLP Duration  SLP Intensity  SLP Treatment/Interventions 12-14 days  Minumum of 1-2 x/day, 30 to 90 minutes  Cognitive remediation/compensation;Environmental controls;Speech/Language facilitation;Cueing hierarchy;Multimodal communication approach;Functional tasks;Therapeutic Exercise;Patient/family education;Medication managment;Dysphagia/aspiration precaution training    Pain Pain Assessment Pain Assessment: No/denies pain  Prior Functioning Cognitive/Linguistic Baseline: Within functional limits Type of Home: House  Lives With: Spouse;Family Available Help at Discharge: Family;Available 24 hours/day Education: high school  Function:  Eating Eating Eating activity did not occur: Safety/medical concerns Modified Consistency Diet:  (pt currently NPO, await MBSS)             Cognition Comprehension Comprehension assist level: Understands basic 25 - 49% of the time/ requires cueing 50 - 75% of the time  Expression   Expression assist level: Expresses basic 25 - 49% of the time/requires cueing 50 - 75% of the time. Uses single words/gestures.  Social Interaction Social Interaction assist level: Interacts appropriately 25 - 49% of time - Needs frequent redirection.  Problem Solving Problem solving assist level: Solves basic 25 - 49% of the time - needs direction more than half the time to initiate, plan or complete simple activities  Memory Memory assist level: Recognizes or recalls 75 - 89% of the  time/requires cueing 10 - 24% of the time   Short Term Goals: Week 1: SLP Short Term Goal 1 (Week 1): Pt follow 2-step directives with min A. SLP Short Term Goal 2 (Week 1): Pt demonstrate verbal communication at the sentence level with mod A. SLP Short Term Goal 3 (Week 1): Pt demonstrate reading comprehension at the sentence level with mod A.  SLP Short Term Goal 4 (Week 1): Pt answer moderate level yes/no questions with min A. SLP Short Term Goal 5 (Week 1): Pt tolerate least restrictive PO as determined by MBSS with compensatory strategies with mod A.  Refer to Care Plan for Long Term Goals  Recommendations for other services: None  Discharge Criteria: Patient will be discharged from SLP if patient refuses treatment 3 consecutive times without medical reason, if treatment goals not met, if there is a change in medical status, if patient makes no progress towards goals or if patient is discharged from hospital.  The above assessment, treatment plan, treatment alternatives and goals were discussed and mutually agreed upon: by patient and by family  [name] 05/14/2015, 4:49 PM

## 2015-05-15 ENCOUNTER — Inpatient Hospital Stay (HOSPITAL_COMMUNITY): Payer: Medicaid Other

## 2015-05-15 ENCOUNTER — Inpatient Hospital Stay (HOSPITAL_COMMUNITY): Payer: Medicaid Other | Admitting: Speech Pathology

## 2015-05-15 ENCOUNTER — Inpatient Hospital Stay (HOSPITAL_COMMUNITY): Payer: Medicaid Other | Admitting: Physical Therapy

## 2015-05-15 ENCOUNTER — Encounter (HOSPITAL_COMMUNITY): Payer: Self-pay | Admitting: Occupational Therapy

## 2015-05-15 DIAGNOSIS — R609 Edema, unspecified: Secondary | ICD-10-CM

## 2015-05-15 DIAGNOSIS — E8779 Other fluid overload: Secondary | ICD-10-CM

## 2015-05-15 LAB — BASIC METABOLIC PANEL
Anion gap: 10 (ref 5–15)
BUN: 8 mg/dL (ref 6–20)
CO2: 27 mmol/L (ref 22–32)
Calcium: 8.5 mg/dL — ABNORMAL LOW (ref 8.9–10.3)
Chloride: 108 mmol/L (ref 101–111)
Creatinine, Ser: 0.88 mg/dL (ref 0.44–1.00)
GFR calc Af Amer: >60 mL/min (ref >60)
GFR calc non Af Amer: >60 mL/min (ref >60)
Glucose, Bld: 87 mg/dL (ref 65–99)
Potassium: 3.8 mmol/L (ref 3.5–5.1)
Sodium: 145 mmol/L (ref 135–145)

## 2015-05-15 MED ORDER — FUROSEMIDE 40 MG PO TABS
40.0000 mg | ORAL_TABLET | Freq: Two times a day (BID) | ORAL | Status: DC
  Administered 2015-05-15 – 2015-05-19 (×8): 40 mg via ORAL
  Filled 2015-05-15 (×8): qty 1

## 2015-05-15 MED ORDER — MAGNESIUM OXIDE 400 (241.3 MG) MG PO TABS
200.0000 mg | ORAL_TABLET | Freq: Two times a day (BID) | ORAL | Status: DC
  Administered 2015-05-15 – 2015-05-20 (×11): 200 mg via ORAL
  Filled 2015-05-15 (×10): qty 1

## 2015-05-15 MED ORDER — FUROSEMIDE 40 MG PO TABS
40.0000 mg | ORAL_TABLET | Freq: Two times a day (BID) | ORAL | Status: DC
  Administered 2015-05-15: 40 mg via ORAL
  Filled 2015-05-15: qty 1

## 2015-05-15 NOTE — Progress Notes (Signed)
Occupational Therapy Session Note  Patient Details  Name: Monica Pugh MRN: [id number] Date of Birth: September 24, 1950  Today's Date: 05/15/2015 OT Individual Time: 1100-1200 OT Individual Time Calculation (min): 60 min    Short Term Goals: Week 1:  OT Short Term Goal 1 (Week 1): Pt will transfer to toilet with steadying A OT Short Term Goal 2 (Week 1): Pt will perform 3/3 toileting tasks with mod A OT Short Term Goal 3 (Week 1): Pt will bath 10/10 parts with min A sit to stand OT Short Term Goal 4 (Week 1): Pt will follow a one step command with only one cue for initation of task OT Short Term Goal 5 (Week 1): Pt will perform LB dressing with mod A  Skilled Therapeutic Interventions/Progress Updates:    self care retraining at sink level.  Not able to shower due to running central line IV. Focus on sit to stands, functional use of right Ue at non dominant level, activity tolerance, attempts to wean off O2 in session, vocalizations of needs and wants, following 1-2 step directions, and functional ambulation without AD. PT with improved right hand mobility today. Pt able to stand pivot transfer today with steadying A to close supervision. Pt still required rest breaks throughout session; but was able to maintain O2 sats above 90% on RA in session. Only one time dipped to 89% but able to recover within seconds. Pt ambulated from room to RN station without AD with min A at a slow pace. Pt was with decr right side body awareness- running into doorway on right.  After rest able to ambulate to dayroom to table again with O2 >90% on RA. Pt participate in cognitive task to further evaluate cognition. Pt able to perform 3 basic task of matching, organization of similar items from field of three and simple money counting with coins.   Left pt in recliner in room on 1 lit of O2 RN aware.  Therapy Documentation Precautions:  Precautions Precautions: Fall Precaution Comments: R hemiparesis; apraxia, aphasia;  central line in LE Restrictions Weight Bearing Restrictions: No Pain: Pain Assessment Pain Assessment: No/denies pain Pain Score: 0-No pain ADL: ADL ADL Comments: see functional navigator  See Function Navigator for Current Functional Status.   Therapy/Group: Individual Therapy  Willeen Cass [facility] 05/15/2015, 1:36 PM

## 2015-05-15 NOTE — Progress Notes (Signed)
Speech Language Pathology Note  Patient Details  Name: Monica Pugh MRN: [id number] Date of Birth: 1950/03/14 Today's Date: 05/15/2015  MBSS complete. Full report located under chart review in imaging section.  Weston Anna, [place], [place]     [place], [place] 05/15/2015, 9:42 AM

## 2015-05-15 NOTE — Progress Notes (Signed)
65 y.o. female in relatively good health who was admitted on 05/09/15 with left facial droop, difficulty handling secretions, difficulty speaking and unresponsiveness. History taken from chart review. CT head negative and she was transferred to [facility] for treatment. She was treated with IV tPA and intubated for airway protection. 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 Right ICA remained occluded and left ICA now patent. 2D echo with EF 55-60% with grade 1 diastolic dysfunction and mild LAE. She had difficulty with vent wean with vomiting and possible RML aspiration PNA. She was started on Unasyn for treatment and tolerated extubation on 03/20.  Patient on ASA/Plavix for secondary stroke prevention.  Subjective/Complaints: Slept well, tolerated therapy did steps Pt aphasic/apraxic  and HOH  Objective: Vital Signs: Blood pressure 142/54, pulse 73, temperature 98.4 F (36.9 C), temperature source Oral, resp. rate 22, height '5\' 6"'  (1.676 m), weight 87 kg (191 lb 12.8 oz), SpO2 99 %. Dg Chest 2 View  05/14/2015  CLINICAL DATA:  Short of breath EXAM: CHEST  2 VIEW COMPARISON:  Yesterday FINDINGS: Diffuse pulmonary edema is worse. Low volumes. Normal heart size. No pneumothorax. Small pleural effusions. IMPRESSION: Worsening volume overload. Electronically Signed   By: Marybelle Killings M.D.   On: 05/14/2015 07:52   Dg Chest Port 1 View  05/13/2015  CLINICAL DATA:  Acute onset shortness of breath this evening. Stroke on 03/18. Smoker. EXAM: PORTABLE CHEST 1 VIEW COMPARISON:  05/10/2015 FINDINGS: Interval removal of endotracheal and enteric tubes. Vascular stent demonstrated in the base of  the neck on the left. Normal heart size and pulmonary vascularity for technique. There is interval development of infiltration in the right lung base suggesting developing pneumonia or atelectasis. Left lung is clear. No blunting of costophrenic angles. No pneumothorax. Calcification of the aorta. Mediastinal contours appear intact. IMPRESSION: Developing infiltration or atelectasis in the right lung base. Electronically Signed   By: Lucienne Capers M.D.   On: 05/13/2015 22:45   Results for orders placed or performed during the hospital encounter of 05/13/15 (from the past 72 hour(s))  Glucose, capillary     Status: None   Collection Time: 05/13/15 11:08 PM  Result Value Ref Range   Glucose-Capillary 85 65 - 99 mg/dL  Glucose, capillary     Status: None   Collection Time: 05/14/15  3:59 AM  Result Value Ref Range   Glucose-Capillary 97 65 - 99 mg/dL  Comprehensive metabolic panel     Status: Abnormal   Collection Time: 05/14/15  5:30 AM  Result Value Ref Range   Sodium 146 (H) 135 - 145 mmol/L   Potassium 3.2 (L) 3.5 - 5.1 mmol/L   Chloride 114 (H) 101 - 111 mmol/L   CO2 24 22 - 32 mmol/L   Glucose, Bld 99 65 - 99 mg/dL   BUN 5 (L) 6 - 20 mg/dL   Creatinine, Ser 0.83 0.44 - 1.00 mg/dL   Calcium 8.3 (L) 8.9 - 10.3 mg/dL   Total Protein 5.2 (L) 6.5 - 8.1 g/dL   Albumin 2.5 (L) 3.5 - 5.0 g/dL   AST 14 (L) 15 - 41 U/L   ALT 10 (L) 14 - 54 U/L   Alkaline Phosphatase 55 38 -  126 U/L   Total Bilirubin 0.9 0.3 - 1.2 mg/dL   GFR calc non Af Amer >60 >60 mL/min   GFR calc Af Amer >60 >60 mL/min    Comment: (NOTE) The eGFR has been calculated using the CKD EPI equation. This calculation has not been validated in all clinical situations. eGFR's persistently <60 mL/min signify possible Chronic Kidney Disease.    Anion gap 8 5 - 15  Magnesium     Status: None   Collection Time: 05/14/15  5:30 AM  Result Value Ref Range   Magnesium 1.8 1.7 - 2.4 mg/dL  Brain natriuretic peptide     Status:  Abnormal   Collection Time: 05/14/15 10:15 AM  Result Value Ref Range   B Natriuretic Peptide 391.6 (H) 0.0 - 100.0 pg/mL  CBC     Status: Abnormal   Collection Time: 05/14/15 10:15 AM  Result Value Ref Range   WBC 8.2 4.0 - 10.5 K/uL   RBC 3.41 (L) 3.87 - 5.11 MIL/uL   Hemoglobin 11.0 (L) 12.0 - 15.0 g/dL   HCT 32.5 (L) 36.0 - 46.0 %   MCV 95.3 78.0 - 100.0 fL   MCH 32.3 26.0 - 34.0 pg   MCHC 33.8 30.0 - 36.0 g/dL   RDW 14.2 11.5 - 15.5 %   Platelets 201 150 - 400 K/uL  Differential     Status: None   Collection Time: 05/14/15 10:15 AM  Result Value Ref Range   Neutrophils Relative % 67 %   Neutro Abs 5.5 1.7 - 7.7 K/uL   Lymphocytes Relative 23 %   Lymphs Abs 1.9 0.7 - 4.0 K/uL   Monocytes Relative 7 %   Monocytes Absolute 0.6 0.1 - 1.0 K/uL   Eosinophils Relative 2 %   Eosinophils Absolute 0.2 0.0 - 0.7 K/uL   Basophils Relative 1 %   Basophils Absolute 0.1 0.0 - 0.1 K/uL      General: No acute distress Mood and affect are appropriate Heart: Regular rate and rhythm no rubs murmurs or extra sounds Lungs: Clear to auscultation, breathing unlabored, no rales or wheezes Abdomen: Positive bowel sounds, soft nontender to palpation, nondistended Extremities: No clubbing, cyanosis,2+ edema RUE, 1+ LUE, 1+ BLE Skin: No evidence of breakdown, no evidence of rash Neurologic: Cranial nerves II through XII intact, motor strength is 5/5 in bilateral deltoid, bicep, tricep, grip, hip flexor, knee extensors, ankle dorsiflexor and plantar flexor Sensory exam normal sensation to light touch and proprioception in bilateral upper and lower extremities Cerebellar exam normal finger to nose to finger as well as heel to shin in bilateral upper and lower extremities Musculoskeletal: Full range of motion in all 4 extremities. No joint swelling  Assessment/Plan: 1. Functional deficits secondary to Dysphagia, aphasia, weakness secondary to left frontal SAH and acute infarct left caudate, putamen  and small areas of infarct left gurus and insular cortex which require 3+ hours per day of interdisciplinary therapy in a comprehensive inpatient rehab setting. Physiatrist is providing close team supervision and 24 hour management of active medical problems listed below. Physiatrist and rehab team continue to assess barriers to discharge/monitor patient progress toward functional and medical goals. FIM: Function - Bathing Position: Wheelchair/chair at sink Body parts bathed by patient: Right arm, Chest, Abdomen Body parts bathed by helper: Left arm, Front perineal area, Buttocks, Right upper leg, Left upper leg, Right lower leg, Left lower leg, Back Assist Level: Touching or steadying assistance(Pt > 75%)  Function- Upper Body Dressing/Undressing What is the  patient wearing?: Hospital gown Assist Level: Touching or steadying assistance(Pt > 75%) Function - Lower Body Dressing/Undressing What is the patient wearing?: Hospital Gown, Non-skid slipper socks Position: Wheelchair/chair at sink Non-skid slipper socks- Performed by helper: Don/doff right sock, Don/doff left sock Assist for footwear: Maximal assist Assist for lower body dressing: Touching or steadying assistance (Pt > 75%)  Function - Toileting Toileting activity did not occur: No continent bowel/bladder event  Function - Air cabin crew transfer activity did not occur: N/A (had BM incontient in bed has catheter)  Function - Chair/bed transfer Chair/bed transfer method: Stand pivot Chair/bed transfer assist level: Touching or steadying assistance (Pt > 75%) Chair/bed transfer assistive device: Armrests  Function - Locomotion: Wheelchair Will patient use wheelchair at discharge?:  (tbd) Type: Manual Max wheelchair distance: 50 Assist Level: Moderate assistance (Pt 50 - 74%) Assist Level: Moderate assistance (Pt 50 - 74%) Assist Level: Dependent (Pt equals 0%) Turns around,maneuvers to table,bed, and  toilet,negotiates 3% grade,maneuvers on rugs and over doorsills: No Function - Locomotion: Ambulation Assistive device: Walker-rolling Max distance: 50 Assist level: Touching or steadying assistance (Pt > 75%) Assist level: Touching or steadying assistance (Pt > 75%) Assist level: Touching or steadying assistance (Pt > 75%)  Function - Comprehension Comprehension: Auditory Comprehension assist level: Understands basic 25 - 49% of the time/ requires cueing 50 - 75% of the time  Function - Expression Expression: Verbal Expression assist level: Expresses basic 25 - 49% of the time/requires cueing 50 - 75% of the time. Uses single words/gestures.  Function - Social Interaction Social Interaction assist level: Interacts appropriately 25 - 49% of time - Needs frequent redirection.  Function - Problem Solving Problem solving assist level: Solves basic 25 - 49% of the time - needs direction more than half the time to initiate, plan or complete simple activities  Function - Memory Memory assist level: Recognizes or recalls 75 - 89% of the time/requires cueing 10 - 24% of the time Patient normally able to recall (first 3 days only): Current season, That he or she is in a hospital Medical Problem List and Plan: 1.  Dysphagia, aphasia, weakness secondary to left frontal SAH and acute infarct left caudate, putamen and small areas of infarct left gurus and insular cortex- Cont CIR 2.  DVT Prophylaxis/Anticoagulation: Mechanical: Sequential compression devices, below knee Bilateral lower extremities 3. Pain Management: tylenol prn for pain.   4. Mood: LCSW for evaluation and support.   5. Neuropsych: This patient is not capable of making decisions on her own behalf. 6. Skin/Wound Care: routine pressure relief measures 7. Fluids/Electrolytes/Nutrition: Monitor I/O. Check lytes in am. 8. Aspiration PNA:  Continue Unasyn. Check follow up  , afebrile 9. Dysphagia: Strict aspiration precautions.   10   Tobacco abuse: Question COPD/OSA. Continue oxygen for now and wean as able.   11. Left ICA stent with thrombolysis: continue ASA/plavix and follow up with IVR in 6 weeks 12.  Hypokalemia- reviewed labs, add supplement and recheck serum K+ 13.  Increasing pulmonary edema, reviewed ECHO diastolic dysfunction not systolic, change lasix from IV to po, recheck BMET LOS (Days) 2 A FACE TO FACE EVALUATION WAS PERFORMED  KIRSTEINS,ANDREW E 05/15/2015, 8:09 AM

## 2015-05-15 NOTE — Progress Notes (Signed)
VASCULAR LAB PRELIMINARY  PRELIMINARY  PRELIMINARY  PRELIMINARY  Bilateral lower extremity venous duplex completed.     Bilateral:  No evidence of DVT, superficial thrombosis, or Baker's Cyst.   Janifer Adie, RVT, RDMS 05/15/2015, 4:24 PM

## 2015-05-15 NOTE — Progress Notes (Signed)
Physical Therapy Session Note  Patient Details  Name: Monica Pugh MRN: [id number] Date of Birth: 09-Feb-1951  Today's Date: 05/15/2015 PT Individual Time: 1345-1445 PT Individual Time Calculation (min): 60 min   Short Term Goals: Week 1:  PT Short Term Goal 1 (Week 1): Pt will demonstrate dynamic standing balance with mod assist PT Short Term Goal 2 (Week 1): Pt will ambulate with LRAD x150' PT Short Term Goal 3 (Week 1): Pt will propel w/c x150' PT Short Term Goal 4 (Week 1): Pt will demonstrate selective attention x1 minute with min cues  Skilled Therapeutic Interventions/Progress Updates:    Pt received seated in w/c, denies pain and agreeable to treatment. Gait to gym x150' total with no AD and min guard; short seated rest break after ~75' due to observed SOB, however upon sitting pt reports "I could keep going", and completed trial to gym. Standing balance with BUE pipe tree for forced use RUE, standing balance on foam while naming and matching colored bean bags, reaching to floor to retrieve bean bags. Min guard to S for all standing balance, with increased unsteadiness on balance foam. Standing toe taps to numbered targets on 4" step with min guard, step ups in parallel bars without UE support x10 reps each LE, alternating LEs for balance, strengthening as well as aerobic conditioning. Nustep x8 min with BUE/BLE, min verbal cues for attention to RUE on handle. Returned to room in w/c totalA for energy conservation. Stand pivot transfer to bed with close S. Verbal/tactile cues for hand/foot positioning for bridging to scoot toward HOB. Remained supine in bed with HOB elevated, all needs within reach and alarm intact at completion of session.   Therapy Documentation Precautions:  Precautions Precautions: Fall Precaution Comments: R hemiparesis; apraxia, aphasia; central line in LE Restrictions Weight Bearing Restrictions: No Pain: Pain Assessment Pain Assessment: No/denies pain   See  Function Navigator for Current Functional Status.   Therapy/Group: Individual Therapy  Luberta Mutter 05/15/2015, 3:52 PM

## 2015-05-15 NOTE — Progress Notes (Signed)
Speech Language Pathology Daily Session Note  Patient Details  Name: Monica Pugh MRN: [id number] Date of Birth: 02-09-1951  Today's Date: 05/15/2015 SLP Individual Time: 1300-1345 SLP Individual Time Calculation (min): 45 min  Short Term Goals: Week 1: SLP Short Term Goal 1 (Week 1): Pt follow 2-step directives with min A. SLP Short Term Goal 2 (Week 1): Pt demonstrate verbal communication at the sentence level with mod A. SLP Short Term Goal 3 (Week 1): Pt demonstrate reading comprehension at the sentence level with mod A.  SLP Short Term Goal 4 (Week 1): Pt answer moderate level yes/no questions with min A. SLP Short Term Goal 5 (Week 1): Pt tolerate least restrictive PO as determined by MBSS with compensatory strategies with mod A. SLP Short Term Goal 5 - Progress (Week 1): Updated due to goal met SLP Short Term Goal 6 (Week 1): Pt tolerate regular consistency diet at mod I level.   Skilled Therapeutic Interventions: Pt seen for dysphagia and speech therapy. Pt trialed with dys 3 tray with thins which she managed with min verbal encouragement. Pt able to self-feed observing small bites independently. No s/s aspiration or oral residue noted throughout meal. Changed to intermittent supervision. Object labeling 100% acc. Pt able to produce sentences in a structured task with mod A. Pt frequently attempts to respond with shaking her hean yes or no, but with increased time and verbal encouragement, is typically able to generate a word- phrase level appropriate response.    Function:  Eating Eating   Modified Consistency Diet: Yes Eating Assist Level: Set up assist for   Eating Set Up Assist For: Opening containers       Cognition Comprehension Comprehension assist level: Understands basic 75 - 89% of the time/ requires cueing 10 - 24% of the time  Expression   Expression assist level: Expresses basic 50 - 74% of the time/requires cueing 25 - 49% of the time. Needs to repeat parts of  sentences.  Social Interaction Social Interaction assist level: Interacts appropriately 75 - 89% of the time - Needs redirection for appropriate language or to initiate interaction.  Problem Solving Problem solving assist level: Solves basic 50 - 74% of the time/requires cueing 25 - 49% of the time  Memory Memory assist level: Recognizes or recalls 75 - 89% of the time/requires cueing 10 - 24% of the time    Pain Pain Assessment Pain Assessment: No/denies pain  Therapy/Group: Individual Therapy  [name] 05/15/2015, 4:19 PM

## 2015-05-15 NOTE — Progress Notes (Signed)
Inpatient [facility] Individual Statement of Services  Patient Name:  Monica Pugh  Date:  05/15/2015  Welcome to the [facility].  Our goal is to provide you with an individualized program based on your diagnosis and situation, designed to meet your specific needs.  With this comprehensive rehabilitation program, you will be expected to participate in at least 3 hours of rehabilitation therapies Monday-Friday, with modified therapy programming on the weekends.  Your rehabilitation program will include the following services:  Physical Therapy (PT), Occupational Therapy (OT), Speech Therapy (ST), 24 hour per day rehabilitation nursing, Case Management (Social Worker), Rehabilitation Medicine, Nutrition Services and Pharmacy Services  Weekly team conferences will be held on Wednesdays to discuss your progress.  Your Social Worker will talk with you frequently to get your input and to update you on team discussions.  Team conferences with you and your family in attendance may also be held.  Expected length of stay: 2 weeks  Overall anticipated outcome: Supervision  Depending on your progress and recovery, your program may change. Your Social Worker will coordinate services and will keep you informed of any changes. Your Social Worker's name and contact numbers are listed  below.  The following services may also be recommended but are not provided by the [facility] will be made to provide these services after discharge if needed.  Arrangements include referral to agencies that provide these services.  Your insurance has been verified to be:  You have applied for Medicaid in [place] primary doctor is:  We will work on this together to make sure that you have one when you leave [place]  Pertinent information will be shared with your doctor and your insurance company.  Social Worker:  Alfonse Alpers, LCSW  [phone] or ([phone]  Information discussed with and copy given to patient by: Trey Sailors, 05/15/2015, 2:17 PM

## 2015-05-15 NOTE — Progress Notes (Addendum)
VASCULAR LAB PRELIMINARY  PRELIMINARY  PRELIMINARY  PRELIMINARY  Right upper extremity venous duplex completed.    No evidence of  DVT in the right arm.  Acute thrombus in superificial cephalic vein.   Gave the results to the patients nurse@4 :45.  Janifer Adie, RVT, RDMS 05/15/2015, 4:57 PM

## 2015-05-15 NOTE — IPOC Note (Signed)
Overall Plan of Care [facility]) Patient Details Name: Monica Pugh MRN: [id number] DOB: Jul 15, 1950  Admitting Diagnosis: CVA  Hospital Problems: Active Problems:   Acute ischemic left MCA stroke (HCC)   Hemiparesis, aphasia, and dysphagia as late effect of cerebrovascular accident (CVA) ([place])   Aspiration pneumonia of right middle lobe due to vomit ([place])   Tobacco abuse   Edema   Internal carotid artery stenosis     Functional Problem List: Nursing Bladder, Bowel, Edema, Endurance, Medication Management, Nutrition, Pain, Safety, Sensory, Skin Integrity  PT Balance, Behavior, Endurance, Motor, Perception, Safety  OT Balance, Cognition, Edema, Endurance, Motor, Nutrition, Pain, Perception, Safety, Sensory, Skin Integrity  SLP Linguistic, Nutrition, Cognition  TR         Basic ADL's: OT Eating, Grooming, Bathing, Dressing, Toileting     Advanced  ADL's: OT       Transfers: PT Bed Mobility, Bed to Chair, Car, Manufacturing systems engineer, Metallurgist: PT Ambulation, Stairs     Additional Impairments: OT Fuctional Use of Upper Extremity  SLP Swallowing, Communication, Social Cognition comprehension, expression Social Interaction (cognition to be further assessed as warranted with language limitations)  TR      Anticipated Outcomes Item Anticipated Outcome  Self Feeding supervision  Swallowing  supervision cueing for least restrictive diet   Basic self-care  supervision  Toileting  supervision   Bathroom Transfers supervision  Bowel/Bladder  Mod assist  Transfers  supervision  Locomotion  supervision  Communication  mod I for basic  Cognition  to be assessed  Pain  <3 on a 0-10 scale  Safety/Judgment  mod I   Therapy Plan: PT Intensity: Minimum of 1-2 x/day ,45 to 90 minutes PT Frequency: 5 out of 7 days PT Duration Estimated Length of Stay: 14 days OT Intensity: Minimum of 1-2 x/day, 45 to 90 minutes OT Frequency: 5 out of 7 days OT  Duration/Estimated Length of Stay: 12-14 days SLP Intensity: Minumum of 1-2 x/day, 30 to 90 minutes SLP Frequency: 3 to 5 out of 7 days SLP Duration/Estimated Length of Stay: 12-14 days       Team Interventions: Nursing Interventions Patient/Family Education, Bladder Management, Bowel Management, Disease Management/Prevention, Pain Management, Medication Management, Skin Care/Wound Management, Dysphagia/Aspiration Precaution Training, Discharge Planning, Psychosocial Support  PT interventions Ambulation/gait training, Pain management, Stair training, Wheelchair propulsion/positioning, Therapeutic Activities, Patient/family education, Engineer, drilling, Training and development officer, Psychosocial support, Therapeutic Exercise, Cognitive remediation/compensation, Community reintegration, Functional mobility training, UE/LE Strength taining/ROM, UE/LE Coordination activities, Neuromuscular re-education, Discharge planning, Splinting/orthotics  OT Interventions Cognitive remediation/compensation, Training and development officer, Academic librarian, Discharge planning, Engineer, drilling, Disease mangement/prevention, Functional electrical stimulation, Neuromuscular re-education, Patient/family education, Self Care/advanced ADL retraining, Splinting/orthotics, Therapeutic Exercise, UE/LE Coordination activities, UE/LE Strength taining/ROM, Therapeutic Activities, Skin care/wound managment, Psychosocial support, Pain management, Functional mobility training, Visual/perceptual remediation/compensation  SLP Interventions Cognitive remediation/compensation, Environmental controls, Speech/Language facilitation, Cueing hierarchy, Multimodal communication approach, Functional tasks, Therapeutic Exercise, Patient/family education, Medication managment, Dysphagia/aspiration precaution training  TR Interventions    SW/CM Interventions Discharge Planning, Psychosocial Support,  Patient/Family Education    Team Discharge Planning: Destination: PT-Home ,OT- Home , SLP-Home Projected Follow-up: PT-Home health PT, 24 hour supervision/assistance, OT-  Outpatient OT, SLP-Outpatient SLP, Home Health SLP Projected Equipment Needs: PT-To be determined, OT- To be determined, SLP-To be determined Equipment Details: PT- , OT-  Patient/family involved in discharge planning: PT- Patient, Family member/caregiver,  OT-Patient, Family member/caregiver, SLP-Patient, Family member/caregiver  MD ELOS: 12-14 days Medical Rehab Prognosis:  Excellent Assessment:  The patient has been admitted for CIR therapies with the diagnosis of left CVA. The team will be addressing functional mobility, strength, stamina, balance, safety, adaptive techniques and equipment, self-care, bowel and bladder mgt, patient and caregiver education, NMR, cognitive perceptual awareness, community reintegration, communication, swallowing, orthotic assessment . Goals have been set at supervision for basic mobility and self-care, mod assist for bowel/bladder, supervision to mod I for communication.    Meredith Staggers, MD, FAAPMR      See Team Conference Notes for weekly updates to the plan of care

## 2015-05-16 ENCOUNTER — Inpatient Hospital Stay (HOSPITAL_COMMUNITY): Payer: Self-pay | Admitting: Physical Therapy

## 2015-05-16 ENCOUNTER — Inpatient Hospital Stay (HOSPITAL_COMMUNITY): Payer: Medicaid Other | Admitting: Occupational Therapy

## 2015-05-16 ENCOUNTER — Inpatient Hospital Stay (HOSPITAL_COMMUNITY): Payer: Medicaid Other | Admitting: Speech Pathology

## 2015-05-16 NOTE — Progress Notes (Signed)
Speech Language Pathology Daily Session Note  Patient Details  Name: Monica Pugh MRN: [id number] Date of Birth: Jun 30, 1950  Today's Date: 05/16/2015 SLP Individual Time: 0051-1021 SLP Individual Time Calculation (min): 45 min  Short Term Goals: Week 1: SLP Short Term Goal 1 (Week 1): Pt follow 2-step directives with min A. SLP Short Term Goal 2 (Week 1): Pt demonstrate verbal communication at the sentence level with mod A. SLP Short Term Goal 3 (Week 1): Pt demonstrate reading comprehension at the sentence level with mod A.  SLP Short Term Goal 4 (Week 1): Pt answer moderate level yes/no questions with min A. SLP Short Term Goal 5 (Week 1): Pt tolerate least restrictive PO as determined by MBSS with compensatory strategies with mod A. SLP Short Term Goal 5 - Progress (Week 1): Updated due to goal met SLP Short Term Goal 6 (Week 1): Pt tolerate regular consistency diet at mod I level.   Skilled Therapeutic Interventions:  Pt was seen for skilled ST targeting goals for dysphagia and communication.  SLP completed skilled observations during presentations of pt's currently prescribed diet to assess toleration from upgrade following yesterday's MBS.  Pt demonstrated grossly intact oropharyngeal swallowing function without overt s/s of aspiration with solids or liquids.  Pt was able to masticate and clear solids from the oral cavity in a timely manner without difficulty.  Pt required min-mod assist verbal cues and increased time for initiation of functional communication.  Pt able to answer personal questions at the sentence level with min assist verbal cues, although spontaneous verbalizations without SLP cues remained at the phrase or word level.  Pt able to follow 2 step commands with min assist verbal cues for sequencing and initiation.  Pt was left in wheelchair with husband at bedside.  Continue per current plan of care.    Function:  Eating Eating                  Cognition Comprehension Comprehension assist level: Understands basic 75 - 89% of the time/ requires cueing 10 - 24% of the time  Expression   Expression assist level: Expresses basic 50 - 74% of the time/requires cueing 25 - 49% of the time. Needs to repeat parts of sentences.  Social Interaction Social Interaction assist level: Interacts appropriately 75 - 89% of the time - Needs redirection for appropriate language or to initiate interaction.  Problem Solving Problem solving assist level: Solves basic 50 - 74% of the time/requires cueing 25 - 49% of the time  Memory Memory assist level: Recognizes or recalls 50 - 74% of the time/requires cueing 25 - 49% of the time    Pain Pain Assessment Pain Assessment: No/denies pain Pain Score: 0-No pain  Therapy/Group: Individual Therapy  Page, Selinda Orion 05/16/2015, 12:20 PM

## 2015-05-16 NOTE — Progress Notes (Signed)
Monica Pugh is a 65 y.o. female 1950/08/18 [id number]  Subjective: No new complaints. No new problems. Slept well. Feeling OK. Spouse at side  Objective: Vital signs in last 24 hours: Temp:  [98.4 F (36.9 C)-98.8 F (37.1 C)] 98.4 F (36.9 C) (03/25 0545) Pulse Rate:  [62-76] 76 (03/25 0545) Resp:  [18-20] 18 (03/25 0545) BP: (139-154)/(46-58) 139/46 mmHg (03/25 0545) SpO2:  [94 %-100 %] 94 % (03/25 0545) Weight change:  Last BM Date: 05/16/15  Intake/Output from previous day: 03/24 0701 - 03/25 0700 In: 80 [P.O.:60; I.V.:20] Out: 1850 [Urine:1850]  Physical Exam General: No apparent distress   Slightly HOH - spouse at side to assist Lungs: Normal effort. Lungs clear to auscultation, no crackles or wheezes. Cardiovascular: Regular rate and rhythm, no edema Neuro: continue RHP - no new deficts   Lab Results: BMET    Component Value Date/Time   NA 145 05/15/2015 0831   K 3.8 05/15/2015 0831   CL 108 05/15/2015 0831   CO2 27 05/15/2015 0831   GLUCOSE 87 05/15/2015 0831   BUN 8 05/15/2015 0831   CREATININE 0.88 05/15/2015 0831   CALCIUM 8.5* 05/15/2015 0831   GFRNONAA >60 05/15/2015 0831   GFRAA >60 05/15/2015 0831   CBC    Component Value Date/Time   WBC 8.2 05/14/2015 1015   RBC 3.41* 05/14/2015 1015   HGB 11.0* 05/14/2015 1015   HCT 32.5* 05/14/2015 1015   PLT 201 05/14/2015 1015   MCV 95.3 05/14/2015 1015   MCH 32.3 05/14/2015 1015   MCHC 33.8 05/14/2015 1015   RDW 14.2 05/14/2015 1015   LYMPHSABS 1.9 05/14/2015 1015   MONOABS 0.6 05/14/2015 1015   EOSABS 0.2 05/14/2015 1015   BASOSABS 0.1 05/14/2015 1015   CBG's (last 3):   Recent Labs  05/13/15 1736 05/13/15 2308 05/14/15 0359  GLUCAP 78 85 97   LFT's Lab Results  Component Value Date   ALT 10* 05/14/2015   AST 14* 05/14/2015   ALKPHOS 55 05/14/2015   BILITOT 0.9 05/14/2015    Studies/Results: Dg Chest 2 View  05/15/2015  CLINICAL DATA:  Short of breath EXAM: CHEST  2 VIEW COMPARISON:   Yesterday FINDINGS: Vascular congestion is improved. Edema has improved. Small pleural effusions and bibasilar opacities are stable. No pneumothorax. IMPRESSION: Improved edema and vascular congestion. Stable basilar opacities and small pleural effusions. Electronically Signed   By: Marybelle Killings M.D.   On: 05/15/2015 09:40   Dg Swallowing Func-speech Pathology  05/15/2015  Objective Swallowing Evaluation: Type of Study: MBS-Modified Barium Swallow Study Patient Details Name: Monica Pugh MRN: [id number] Date of Birth: 1950-05-28 Today's Date: 05/15/2015 Time: SLP Start Time (ACUTE ONLY): 0900-SLP Stop Time (ACUTE ONLY): 0930 SLP Time Calculation (min) (ACUTE ONLY): 30 min Past Medical History: No past medical history on file. Past Surgical History: Past Surgical History Procedure Laterality Date . Tubal ligation   . Radiology with anesthesia N/A 05/09/2015   Procedure: RADIOLOGY WITH ANESTHESIA;  Surgeon: Luanne Bras, MD;  Location: [facility];  Service: Radiology;  Laterality: N/A; HPI: 65 y.o. female patient who presented with symptoms of aphasia, right neglect and evidence of right sensorimotor deficits. Intubated;  TPA administered; CT angiogram showed right ICA occlusion; IR thrombectomy with revascularization of left ICA and MCA, stent placed proximal internal carotid. Post-procedure CT showed left medial frontal intraparenchymal hemorrhage with left frontal subarachnoid hemorrhage.  ETT 3/18-3/20. Subjective: alert, beginning to verbalize Assessment / Plan / Recommendation CHL IP CLINICAL IMPRESSIONS 05/15/2015 Therapy Diagnosis [facility]  Clinical Impression Patient's swallowing function is essentially WFL. Patient demonstrates flash penetration with thin liquids via straw with decreased efficient mastication of solid textures, suspect due to missing dentition (dentures not in). Recommend Dys. 3 textures with thin liquids via cup with full supervision to maximize safety due to cognitive-linguistic impairments.  Educated the patient's husband and son of MBS results and recommendations. Both verbalized understanding but will need reinforcement.  Impact on safety and function Mild aspiration risk   CHL IP TREATMENT RECOMMENDATION 05/15/2015 Treatment Recommendations Therapy as outlined in treatment plan below   Prognosis 05/15/2015 Prognosis for Safe Diet Advancement Good Barriers to Reach Goals Language deficits Barriers/Prognosis Comment -- CHL IP DIET RECOMMENDATION 05/15/2015 SLP Diet Recommendations Dysphagia 3 (Mech soft) solids;Thin liquid Liquid Administration via Cup;No straw Medication Administration Whole meds with liquid Compensations Slow rate;Small sips/bites;Minimize environmental distractions Postural Changes Seated upright at 90 degrees   CHL IP OTHER RECOMMENDATIONS 05/15/2015 Recommended Consults -- Oral Care Recommendations Oral care BID Other Recommendations --   CHL IP FOLLOW UP RECOMMENDATIONS 05/15/2015 Follow up Recommendations 24 hour supervision/assistance;Home health SLP;Outpatient SLP   CHL IP FREQUENCY AND DURATION 05/15/2015 Speech Therapy Frequency (ACUTE ONLY) min 5x/week Treatment Duration 2 weeks      CHL IP ORAL PHASE 05/15/2015 Oral Phase Impaired Oral - Pudding Teaspoon -- Oral - Pudding Cup -- Oral - Honey Teaspoon -- Oral - Honey Cup -- Oral - Nectar Teaspoon -- Oral - Nectar Cup -- Oral - Nectar Straw -- Oral - Thin Teaspoon -- Oral - Thin Cup -- Oral - Thin Straw -- Oral - Puree -- Oral - Mech Soft Impaired mastication Oral - Regular -- Oral - Multi-Consistency -- Oral - Pill -- Oral Phase - Comment --  CHL IP PHARYNGEAL PHASE 05/15/2015 Pharyngeal Phase Impaired Pharyngeal- Pudding Teaspoon -- Pharyngeal -- Pharyngeal- Pudding Cup -- Pharyngeal -- Pharyngeal- Honey Teaspoon -- Pharyngeal -- Pharyngeal- Honey Cup -- Pharyngeal -- Pharyngeal- Nectar Teaspoon -- Pharyngeal -- Pharyngeal- Nectar Cup -- Pharyngeal -- Pharyngeal- Nectar Straw -- Pharyngeal -- Pharyngeal- Thin Teaspoon --  Pharyngeal -- Pharyngeal- Thin Cup -- Pharyngeal -- Pharyngeal- Thin Straw Penetration/Aspiration during swallow Pharyngeal Material enters airway, remains ABOVE vocal cords then ejected out Pharyngeal- Puree -- Pharyngeal -- Pharyngeal- Mechanical Soft Delayed swallow initiation-vallecula Pharyngeal -- Pharyngeal- Regular -- Pharyngeal -- Pharyngeal- Multi-consistency -- Pharyngeal -- Pharyngeal- Pill -- Pharyngeal -- Pharyngeal Comment --  CHL IP CERVICAL ESOPHAGEAL PHASE 05/15/2015 Cervical Esophageal Phase WFL Pudding Teaspoon -- Pudding Cup -- Honey Teaspoon -- Honey Cup -- Nectar Teaspoon -- Nectar Cup -- Nectar Straw -- Thin Teaspoon -- Thin Cup -- Thin Straw -- Puree -- Mechanical Soft -- Regular -- Multi-consistency -- Pill -- Cervical Esophageal Comment -- No flowsheet data found. Weston Anna, [place], [address], [place] 05/15/2015, 12:09 PM               Medications:  I have reviewed the patient's current medications. Scheduled Medications: . antiseptic oral rinse  7 mL Mouth Rinse QID  . aspirin  325 mg Oral Q breakfast  . chlorhexidine gluconate (SAGE KIT)  15 mL Mouth Rinse BID  . clopidogrel  75 mg Oral Q breakfast  . furosemide  40 mg Oral BID  . magnesium oxide  200 mg Oral BID  . potassium chloride  20 mEq Oral BID  . [place]   Oral Once  . saccharomyces boulardii  250 mg Oral BID   PRN Medications: acetaminophen, alum & mag hydroxide-simeth, bisacodyl, cloNIDine, guaiFENesin-dextromethorphan,  ipratropium-albuterol, ondansetron (ZOFRAN) IV, prochlorperazine **OR** prochlorperazine **OR** prochlorperazine, senna-docusate, sodium chloride flush, sodium phosphate, traZODone  Assessment/Plan: Active Problems:   Acute ischemic left MCA stroke (HCC)   Hemiparesis, aphasia, and dysphagia as late effect of cerebrovascular accident (CVA) ([place])   Aspiration pneumonia of right middle lobe due to vomit ([place])   Tobacco abuse   Edema   Internal carotid artery  stenosis  1. Dysphagia, aphasia, weakness secondary to left frontal SAH and acute infarct left caudate, putamen and small areas of infarct left gurus and insular cortex- Cont CIR 2. DVT Prophylaxis/Anticoagulation: Mechanical: Sequential compression devices, below knee Bilateral lower extremities 3. Pain Management: tylenol prn for pain.  4. Mood: LCSW for evaluation and support.  5. Neuropsych: This patient is not capable of making decisions on her own behalf. 6. Skin/Wound Care: routine pressure relief measures 7. Fluids/Electrolytes/Nutrition: Monitor I/O. Check lytes in am. 8. Aspiration PNA:s/p Unasyn (DC'd 3/24). afebrile 9. Dysphagia: Strict aspiration precautions.  10 Tobacco abuse PTA - suspect COPD/OSA. Continue oxygen for now and wean as able.  11. Left ICA stent with thrombolysis: continue ASA/plavix and follow up with IVR in 6 weeks 12. Hypokalemia- reviewed labs, resolved 13. acute diastolic CHF w/ Increasing pulmonary edema, reviewed ECHO: diastolic dysfunction (not systolic), changed lasix from IV to po; BMET stable 3/23  Length of stay, days: 3   Valerie A. Asa Lente, MD 05/16/2015, 10:43 AM

## 2015-05-16 NOTE — Plan of Care (Signed)
Problem: RH BLADDER ELIMINATION Goal: RH STG MANAGE BLADDER WITH ASSISTANCE STG Manage Bladder With Assistance  Outcome: Not Progressing Foley cath intact  Goal: RH STG MANAGE BLADDER WITH EQUIPMENT WITH ASSISTANCE STG Manage Bladder With Equipment With Assistance  Outcome: Not Progressing Foley intact

## 2015-05-16 NOTE — Progress Notes (Signed)
Physical Therapy Session Note  Patient Details  Name: Monica Pugh MRN: [id number] Date of Birth: 10-03-1950  Today's Date: 05/16/2015 PT Individual Time:  -      Short Term Goals: Week 1:  PT Short Term Goal 1 (Week 1): Pt will demonstrate dynamic standing balance with mod assist PT Short Term Goal 2 (Week 1): Pt will ambulate with LRAD x150' PT Short Term Goal 3 (Week 1): Pt will propel w/c x150' PT Short Term Goal 4 (Week 1): Pt will demonstrate selective attention x1 minute with min cues  Skilled Therapeutic Interventions/Progress Updates:    Patient received supine in bed with family present. Patient agreeable to PT. Patient performed gait training in hall with no AD for 181ft x2, and min A from PT with cues for obstacle negotiation on the R and decreased gait speed. Cybex seated marches x 3 minutes. Standing marches x 2 minutes and 1 minute. PT decreased O2 to 0L/min and patient was able to remain >94% with all exercises on Cybex. Neuromuscular re-education with weight shifting control on Biodex balane, Lateral and anterior posterior Weight shift x 1 minute each. Catch game on Biodex, random level 1 x 2 minutes. Catch on biodex, maze level 1. Patient demonstrated decreased posterior and R weight shifting compare to other directions, requiring manual facilitation to reach end range targets. Patient returned to room and performed bed mobility with supervision A to supine position. Call bell was left in reach and husband in room.   Therapy Documentation Precautions:  Precautions Precautions: Fall Precaution Comments: R hemiparesis Restrictions Weight Bearing Restrictions: No General:   Vital Signs: Therapy Vitals Temp: 98.6 F (37 C) Temp Source: Oral Pulse Rate: 61 Resp: 17 BP: (!) 145/50 mmHg Patient Position (if appropriate): Lying Oxygen Therapy SpO2: 96 % O2 Device: Nasal Cannula O2 Flow Rate (L/min): 2 L/min Pain: Pain Assessment Pain Assessment: No/denies pain Pain  Score: 0-No pain   See Function Navigator for Current Functional Status.   Therapy/Group: Individual Therapy  Lorie Phenix 05/16/2015, 3:18 PM

## 2015-05-16 NOTE — Progress Notes (Signed)
Physical Therapy Session Note  Patient Details  Name: Monica Pugh MRN: [id number] Date of Birth: Jul 11, 1950  Today's Date: 05/16/2015 PT Individual Time: 1001-1056 PT Individual Time Calculation (min): 55 min   Short Term Goals: Week 1:  PT Short Term Goal 1 (Week 1): Pt will demonstrate dynamic standing balance with mod assist PT Short Term Goal 2 (Week 1): Pt will ambulate with LRAD x150' PT Short Term Goal 3 (Week 1): Pt will propel w/c x150' PT Short Term Goal 4 (Week 1): Pt will demonstrate selective attention x1 minute with min cues Week 2:     Skilled Therapeutic Interventions/Progress Updates:     Gait training without AD for 186f x2 with Min A and cues for R side obstacles.  NMR. Alternating Toe taps in 6 inch cone  Toe taps on 2 6 ich cones with min A.  Step ups on 5 inch step x 5 each LE. With CGA A with LLE, and min A on R LE with HHA. With cues for improved weight shift and miin A x 1 to prevent knee buckle.   Gait training through obstacle course including weave through 3 cones and stepping over 3 bar weights for 20 ft x 2  Dynamic gait training with stepping over 4 inch bolsters for 15 ftwith min A.     Lateral and forward lunges to 4 targets in random order. CGA.  Cues for increased step length and height provided by PT as well as cues for ovoid obstacles on R side   Patient returned to room and performed bed mobility for sit>supine with supervision A and cues for technique and sequencing from PT. Cell bell was within reach and all other needs met.    Therapy Documentation Precautions:  Precautions Precautions: Fall Precaution Comments: R hemiparesis Restrictions Weight Bearing Restrictions: No General:   Vital Signs:  Pain: Pain Assessment Pain Assessment: No/denies pain Pain Score: 0-No pain  See Function Navigator for Current Functional Status.   Therapy/Group: Individual Therapy  ALorie Phenix3/25/2017, 1:01 PM

## 2015-05-16 NOTE — Progress Notes (Signed)
Social Work Assessment and Plan  Patient Details  Name: Monica Pugh MRN: [id number] Date of Birth: Jan 06, 1951  Today's Date: 05/16/2015  Problem List:  Patient Active Problem List   Diagnosis Date Noted  . Hemiparesis, aphasia, and dysphagia as late effect of cerebrovascular accident (CVA) ([place])   . Aspiration pneumonia of right middle lobe due to vomit ([place])   . Tobacco abuse   . Edema   . Internal carotid artery stenosis   . Acute ischemic left MCA stroke ([place])   . Cytotoxic brain edema ([place]) 05/11/2015  . Arterial ischemic stroke, MCA (middle cerebral artery), left, acute ([place]) 05/09/2015   Past Medical History: History reviewed. No pertinent past medical history. Past Surgical History:  Past Surgical History  Procedure Laterality Date  . Tubal ligation    . Radiology with anesthesia N/A 05/09/2015    Procedure: RADIOLOGY WITH ANESTHESIA;  Surgeon: Luanne Bras, MD;  Location: [facility];  Service: Radiology;  Laterality: N/A;   Social History:  reports that she has been smoking Cigarettes.  She has a 60 pack-year smoking history. She does not have any smokeless tobacco history on file. Her alcohol and drug histories are not on file.  Family / Support Systems Marital Status: Married How Long?: 30 years Patient Roles: Spouse, Parent Spouse/Significant Other: Monica Pugh - husband - [phone] (h); [phone] (c) Children: 3 children - Monica Pugh and another son Other Supports: extended family Anticipated Caregiver: Spouse: Monica Pugh and daughter: Monica Pugh  Ability/Limitations of Caregiver: none Caregiver Availability: 24/7 Family Dynamics: close, supportive family  Social History Preferred language: English Religion:  Read: Yes Write: Yes Employment Status: Retired Date Retired/Disabled/Unemployed: a year ago when company closed; 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 Age Retired: 57 Legal History/Current Legal  Issues: none reported Guardian/Conservator: none reported - husband is assisting with decision making at this time.   Abuse/Neglect Physical Abuse: Denies Verbal Abuse: Denies Sexual Abuse: Denies Exploitation of patient/patient's resources: Denies Self-Neglect: Denies  Emotional Status Pt's affect, behavior and adjustment status: Pt is hard of hearing, but she is also not verbalizing much at this time.  She will answer yes/no questions.  She and her son report her Pugh is okay. Recent Psychosocial Issues: Pt was to start a new job about the same time she had the stroke. Psychiatric History: none reported Substance Abuse History: none reported  Patient / Family Perceptions, Expectations & Goals Pt/Family understanding of illness & functional limitations: Son reported that they have gad their questions answered and that family has a good understanding of pt's condition. Premorbid pt/family roles/activities: Pt enjoys her family and was preparing to start a new job. Anticipated changes in roles/activities/participation: Pt would like to get back home to her family. Pt/family expectations/goals: Pt wants to get out of rehab and back home and to regain use of her right arm.  Community Resources [employer] Scripts: None Premorbid Home Care/DME Agencies: None Transportation available at discharge: family Resource referrals recommended: Neuropsychology, Support group (specify)  Discharge Planning Living Arrangements: Spouse/significant other Support Systems: Spouse/significant other, Children, Other relatives Type of Residence: Private residence Insurance Resources: Self-pay (Husband has already applied pt for Medicaid and disability.) Financial Resources: Family Support Financial Screen Referred: Previously completed Money Management: Spouse Does the patient have any problems obtaining your medications?: Yes (Describe) (Pt does not have insurance at this time.) Home Management: Pt's  family can manage this. Patient/Family Preliminary Plans: Pt to return to  her home with her husband and dtr to be with her 24/7. Social Work Anticipated Follow Up Needs: HH/OP, Support Group Expected length of stay: 14 days  Clinical Impression CSW met with pt , her son and son's girlfriend, and eventually pt's husband to introduce self and role of CSW, as well as to complete assessment.  Pt was quiet during the visit, but would answer yes/no questions.  Pt's husband will be with her 24/7 at home and will have his dtr with pt if he needs to leave.Pt was about to start a new job when this happened.  She is uninsured and husband has already submitted an application with DSS in [facility].  Pt does not have a PCP, so husband would like to gt her established with his PCP practice.  CSW will look into this for pt.  CSW will assist with d/c needs and support pt/family during her stay.  Prevatt, Silvestre Mesi 05/16/2015, 10:05 AM

## 2015-05-16 NOTE — Progress Notes (Signed)
Occupational Therapy Session Note  Patient Details  Name: Monica Pugh MRN: [id number] Date of Birth: 10-Apr-1950  Today's Date: 05/16/2015 OT Individual Time: 1340-1500 OT Individual Time Calculation (min): 80 min    Short Term Goals: Week 1:  OT Short Term Goal 1 (Week 1): Pt will transfer to toilet with steadying A OT Short Term Goal 2 (Week 1): Pt will perform 3/3 toileting tasks with mod A OT Short Term Goal 3 (Week 1): Pt will bath 10/10 parts with min A sit to stand OT Short Term Goal 4 (Week 1): Pt will follow a one step command with only one cue for initation of task OT Short Term Goal 5 (Week 1): Pt will perform LB dressing with mod A  Skilled Therapeutic Interventions/Progress Updates: Though about 8 family members present upon approach for therapy, Mr. Theriot asked the family members to exit the patient room until therapy was completed.   Patient participated in skilled OT as follows:       Neuro re-education on right upper extremity  dynamic sitting balance (Supervision to CGA)  maintained focus and sustained attenntion during session (after family left the room) with Min A   Patient required time and much assist with expressive and receptive communication.    Patient and dtr in law were instructed on  Retrograde massage to digits and then volar aspect of patient's swollen right hand.  Following request fromDtr in law, this clinician reviewed the estimated length of stay and goals set by the team and patient's primary therapists  At end of session, patient was assisted back to supine on bed and was able to return demonstation for using call bell to call for help or staff.     Therapy Documentation Precautions:  Precautions Precautions: Fall Precaution Comments: R hemiparesis Restrictions Weight Bearing Restrictions: No   Pain: Pain Assessment Pain Assessment: No/denies pain Pain Score: 0-No pain See Function Navigator for Current Functional  Status.   Therapy/Group: Individual Therapy  Alfredia Ferguson [facility] 05/16/2015, 6:29 PM

## 2015-05-17 ENCOUNTER — Inpatient Hospital Stay (HOSPITAL_COMMUNITY): Payer: Self-pay | Admitting: Physical Therapy

## 2015-05-17 NOTE — Progress Notes (Signed)
Physical Therapy Session Note  Patient Details  Name: Monica Pugh MRN: [id number] Date of Birth: [birth year]  Today's Date: 05/17/2015 PT Individual Time: 1430-1500 PT Individual Time Calculation (min): 30 min   Short Term Goals: Week 1:  PT Short Term Goal 1 (Week 1): Pt will demonstrate dynamic standing balance with mod assist PT Short Term Goal 2 (Week 1): Pt will ambulate with LRAD x150' PT Short Term Goal 3 (Week 1): Pt will propel w/c x150' PT Short Term Goal 4 (Week 1): Pt will demonstrate selective attention x1 minute with min cues  Skilled Therapeutic Interventions/Progress Updates:    Pt trialed on RA in session maintaining SpO2 WFL t/o session. Pt with posterior weight shift when descending stairs adding to instability. Pt would continue to benefit from skilled PT services to increase functional mobility.  Therapy Documentation Precautions:  Precautions Precautions: Fall Precaution Comments: R hemiparesis Restrictions Weight Bearing Restrictions: No Vital Signs: Therapy Vitals Temp: 97.9 F (36.6 C) Temp Source: Oral Pulse Rate: 69 Resp: 18 BP: (!) 152/55 mmHg Patient Position (if appropriate): Lying Oxygen Therapy SpO2: 99 % O2 Device: Not Delivered 1L O2,  Pulse 74 SpO2 96% RA s/p stairs Pain: Pain Assessment Pain Assessment: No/denies pain Mobility:  Min Guard with cues for safety and attention Locomotion :   Min Guard with cues for safety and attention Other Treatments:  Pt trialed on RA at points in session. Pt educated on safety in mobility, rehab plan, and progressing mobility. Pt performs transfers 2x5 in session.Pt performs standing balance tasks in reponse to visual feedback on Biodex. Heel raises 2x10 in session. Pt performs static standing 1'x3 in session. Gait on uneven surfaces performed.   See Function Navigator for Current Functional Status.   Therapy/Group: Individual Therapy  Monica Pugh 05/17/2015, 2:39 PM

## 2015-05-17 NOTE — Progress Notes (Signed)
Monica Pugh is a 65 y.o. female 1950/09/14 [id number]  Subjective: Anxious to gain freedom: walk with spouse, take bath -No new complaints or problems. Slept well. Feeling OK. Spouse at side  Objective: Vital signs in last 24 hours: Temp:  [97.9 F (36.6 C)-98.6 F (37 C)] 97.9 F (36.6 C) (03/26 0506) Pulse Rate:  [61-73] 73 (03/26 0506) Resp:  [17] 17 (03/26 0506) BP: (145-146)/(50-58) 146/58 mmHg (03/26 0506) SpO2:  [96 %-97 %] 97 % (03/26 0506) Weight:  [78.654 kg (173 lb 6.4 oz)] 78.654 kg (173 lb 6.4 oz) (03/26 0506) Weight change:  Last BM Date: 05/16/15  Intake/Output from previous day: 03/25 0701 - 03/26 0700 In: 720 [P.O.:720] Out: 2800 [Urine:2800]  Physical Exam General: No apparent distress   Slightly HOH - spouse at side to assist Lungs: Normal effort. Lungs clear to auscultation, no crackles or wheezes. Cardiovascular: Regular rate and rhythm, no edema Neuro: continued RHP - no new deficts   Lab Results: BMET    Component Value Date/Time   NA 145 05/15/2015 0831   K 3.8 05/15/2015 0831   CL 108 05/15/2015 0831   CO2 27 05/15/2015 0831   GLUCOSE 87 05/15/2015 0831   BUN 8 05/15/2015 0831   CREATININE 0.88 05/15/2015 0831   CALCIUM 8.5* 05/15/2015 0831   GFRNONAA >60 05/15/2015 0831   GFRAA >60 05/15/2015 0831   CBC    Component Value Date/Time   WBC 8.2 05/14/2015 1015   RBC 3.41* 05/14/2015 1015   HGB 11.0* 05/14/2015 1015   HCT 32.5* 05/14/2015 1015   PLT 201 05/14/2015 1015   MCV 95.3 05/14/2015 1015   MCH 32.3 05/14/2015 1015   MCHC 33.8 05/14/2015 1015   RDW 14.2 05/14/2015 1015   LYMPHSABS 1.9 05/14/2015 1015   MONOABS 0.6 05/14/2015 1015   EOSABS 0.2 05/14/2015 1015   BASOSABS 0.1 05/14/2015 1015   CBG's (last 3):  No results for input(s): GLUCAP in the last 72 hours. LFT's Lab Results  Component Value Date   ALT 10* 05/14/2015   AST 14* 05/14/2015   ALKPHOS 55 05/14/2015   BILITOT 0.9 05/14/2015    Studies/Results: No  results found.  Medications:  I have reviewed the patient's current medications. Scheduled Medications: . antiseptic oral rinse  7 mL Mouth Rinse QID  . aspirin  325 mg Oral Q breakfast  . chlorhexidine gluconate (SAGE KIT)  15 mL Mouth Rinse BID  . clopidogrel  75 mg Oral Q breakfast  . furosemide  40 mg Oral BID  . magnesium oxide  200 mg Oral BID  . potassium chloride  20 mEq Oral BID  . [place]   Oral Once  . saccharomyces boulardii  250 mg Oral BID   PRN Medications: acetaminophen, alum & mag hydroxide-simeth, bisacodyl, cloNIDine, guaiFENesin-dextromethorphan, ipratropium-albuterol, ondansetron (ZOFRAN) IV, prochlorperazine **OR** prochlorperazine **OR** prochlorperazine, senna-docusate, sodium chloride flush, sodium phosphate, traZODone  Assessment/Plan: Active Problems:   Acute ischemic left MCA stroke ([facility])   Hemiparesis, aphasia, and dysphagia as late effect of cerebrovascular accident (CVA) ([place])   Aspiration pneumonia of right middle lobe due to vomit ([place])   Tobacco abuse   Edema   Internal carotid artery stenosis  1. Dysphagia, aphasia, weakness secondary to left frontal SAH and acute infarct left caudate, putamen and small areas of infarct left gurus and insular cortex- Cont CIR 2. DVT Prophylaxis/Anticoagulation: Mechanical: Sequential compression devices, below knee Bilateral lower extremities 3. Pain Management: tylenol prn for pain.  4. Mood: LCSW for  evaluation and support.  5. Neuropsych: This patient is not capable of making decisions on her own behalf. 6. Skin/Wound Care: routine pressure relief measures 7. Fluids/Electrolytes/Nutrition: Monitor I/O. Check lytes in am. 8. Aspiration PNA:s/p Unasyn (DC'd 3/24). afebrile 9. Dysphagia: Strict aspiration precautions.  10 Tobacco abuse PTA - suspect COPD/OSA. Continue oxygen for now and wean as able.  11. Left ICA stent with thrombolysis: continue ASA/plavix and follow up with IVR in 6  weeks 12. Hypokalemia- reviewed labs, resolved 13. acute diastolic CHF w/ Increasing pulmonary edema, reviewed ECHO: diastolic dysfunction (not systolic), changed lasix from IV to po; BMET stable 3/23  Length of stay, days: 4   Valerie A. Asa Lente, MD 05/17/2015, 10:41 AM

## 2015-05-18 ENCOUNTER — Inpatient Hospital Stay (HOSPITAL_COMMUNITY): Payer: Medicaid Other | Admitting: Physical Therapy

## 2015-05-18 ENCOUNTER — Inpatient Hospital Stay (HOSPITAL_COMMUNITY): Payer: Medicaid Other | Admitting: Occupational Therapy

## 2015-05-18 ENCOUNTER — Inpatient Hospital Stay (HOSPITAL_COMMUNITY): Payer: Medicaid Other | Admitting: Speech Pathology

## 2015-05-18 NOTE — Progress Notes (Signed)
Speech Language Pathology Daily Session Note  Patient Details  Name: Monica Pugh MRN: [id number] Date of Birth: [birth year]  Today's Date: 05/18/2015 SLP Individual Time: 0935-1030 SLP Individual Time Calculation (min): 55 min  Short Term Goals: Week 1: SLP Short Term Goal 1 (Week 1): Pt follow 2-step directives with min A. SLP Short Term Goal 2 (Week 1): Pt demonstrate verbal communication at the sentence level with mod A. SLP Short Term Goal 3 (Week 1): Pt demonstrate reading comprehension at the sentence level with mod A.  SLP Short Term Goal 4 (Week 1): Pt answer moderate level yes/no questions with min A. SLP Short Term Goal 5 (Week 1): Pt tolerate least restrictive PO as determined by MBSS with compensatory strategies with mod A. SLP Short Term Goal 5 - Progress (Week 1): Updated due to goal met SLP Short Term Goal 6 (Week 1): Pt tolerate regular consistency diet at mod I level.   Skilled Therapeutic Interventions:  Pt was seen for skilled ST targeting communication goals.  Upon arrival, when asked what pt had been doing this morning, pt was noted to perseverate on the words "farm animals," and required mod verbal cues for cessation.  Mod-max assist verbal cues were also needed to verbalize at least 2-3 activities/details from previous therapy session.  SLP facilitated the session with a verbal description task targeting expansion of verbal utterances to the phrase level.  Pt was able to describe written people, places, or objects with overall min-mod assist verbal cues and more than a reasonable amount of time.  Pt ambulated back to her room with overall supervision assist for route recall.  Pt left in bed with family at bedside.  Continue per current plan of care.     Function:  Eating Eating                 Cognition Comprehension Comprehension assist level: Understands basic 75 - 89% of the time/ requires cueing 10 - 24% of the time  Expression   Expression assist level:  Expresses basic 50 - 74% of the time/requires cueing 25 - 49% of the time. Needs to repeat parts of sentences.  Social Interaction Social Interaction assist level: Interacts appropriately 75 - 89% of the time - Needs redirection for appropriate language or to initiate interaction.  Problem Solving Problem solving assist level: Solves basic 75 - 89% of the time/requires cueing 10 - 24% of the time  Memory Memory assist level: Recognizes or recalls 50 - 74% of the time/requires cueing 25 - 49% of the time    Pain Pain Assessment Pain Assessment: No/denies pain Pain Score: 0-No pain  Therapy/Group: Individual Therapy  Monica Pugh, Monica Pugh 05/18/2015, 12:42 PM

## 2015-05-18 NOTE — Progress Notes (Signed)
Physical Therapy Session Note  Patient Details  Name: Monica Pugh MRN: [id number] Date of Birth: [birth year]  Today's Date: 05/18/2015 PT Individual Time: 1531-1700 PT Individual Time Calculation (min): 89 min   Short Term Goals: Week 1:  PT Short Term Goal 1 (Week 1): Pt will demonstrate dynamic standing balance with mod assist PT Short Term Goal 2 (Week 1): Pt will ambulate with LRAD x150' PT Short Term Goal 3 (Week 1): Pt will propel w/c x150' PT Short Term Goal 4 (Week 1): Pt will demonstrate selective attention x1 minute with min cues  Skilled Therapeutic Interventions/Progress Updates:    Pt received sitting on EOB with family present & agreeable to PT. Pt ambulated from [employer] without AD & with supervision as pt demonstrated intermittent lateral sway.  Pt performed car transfer at simulated car height with mod I.  Apartment bed was utilized for pt to perform bed mobility; pt required supervision for rolling L<>R as PT cued pt to cross one LE over another to help increase ease of rolling, and to scoot towards center of bed before rolling to R (edge of bed). Pt able to negotiate 4 steps x 3 consecutive trials with R railing & RUE only, as well as leading with RLE for strengthening purposes; pt required supervision for task. Pt's balance was challenged by standing on airex foam with narrow base of support and no UE support while reaching outside of BOS to obtain & toss horseshoes.  Pt able to retreive objects from floor with good safety awareness and no LOB. Pt completed Berg balance test & scored 50/56; PT educated pt on interpretation of score. Patient demonstrates increased fall risk as noted by score of 50/56 on Berg Balance Scale.  (<36= high risk for falls, close to 100%; 37-45 significant >80%; 46-51 moderate >50%; 52-55 lower >25%). PT noted pt's difficulty with attending to R as well as she does her L & pt agreed. Pt utilized dynavision to help increase R-side awareness. Pt completed  endurance task (4 minutes) with RUE only on dynavision, with slowest reaction time to lower R quadrant. Pt completed task 2 additional times (1 minute each) with focus on lower half of board and slowest reaction time to lower left quadrant. Pt required seated rest breaks between tasks 2/2 RUE & overall fatigue. Pt utilized nu-step on Level 2 x 3 minutes + Level 3 x 9 minutes for cardiovascular endurance training, and halfway through task switched to only using RUE instead of BUE for strengthening. Pt reported 13 on Borg RPE scale during task. Pt then able to complete 5x sit-to-stand from 20 inch height surface without BUE in 16 seconds. Pt ambulated throughout gym searching for small objects while practicing scanning & attending to all directions; pt did not require any cuing to find objects. Pt ambulated back to room & left sitting on EOB and set up with meal try; family present.    Therapy Documentation Precautions:  Precautions Precautions: Fall Precaution Comments: R hemiparesis Restrictions Weight Bearing Restrictions: No  Pain Assessment Pain Assessment: No/denies pain  Balance: Balance Balance Assessed: Yes Standardized Balance Assessment Standardized Balance Assessment: Berg Balance Test Berg Balance Test Sit to Stand: Able to stand without using hands and stabilize independently Standing Unsupported: Able to stand safely 2 minutes Sitting with Back Unsupported but Feet Supported on Floor or Stool: Able to sit safely and securely 2 minutes Stand to Sit: Sits safely with minimal use of hands Transfers: Able to transfer safely, minor use of hands  Standing Unsupported with Eyes Closed: Able to stand 10 seconds safely Standing Ubsupported with Feet Together: Able to place feet together independently and stand 1 minute safely From Standing, Reach Forward with Outstretched Arm: Can reach forward >5 cm safely (2") From Standing Position, Pick up Object from Floor: Able to pick up shoe  safely and easily From Standing Position, Turn to Look Behind Over each Shoulder: Looks behind from both sides and weight shifts well Turn 360 Degrees: Able to turn 360 degrees safely but slowly (12 seconds for both turns) Standing Unsupported, Alternately Place Feet on Step/Stool: Able to stand independently and safely and complete 8 steps in 20 seconds (9 seconds) Standing Unsupported, One Foot in Front: Able to take small step independently and hold 30 seconds Standing on One Leg: Able to lift leg independently and hold > 10 seconds Total Score: 50   See Function Navigator for Current Functional Status.   Therapy/Group: Individual Therapy  Waunita Schooner 05/18/2015, 4:13 PM

## 2015-05-18 NOTE — Progress Notes (Signed)
Occupational Therapy Session Note  Patient Details  Name: Monica Pugh MRN: [id number] Date of Birth: 05/02/1950  Today's Date: 05/18/2015 OT Individual Time: [id number] Individual Time Calculation (min): 60 min    Short Term Goals: Week 1:  OT Short Term Goal 1 (Week 1): Pt will transfer to toilet with steadying A OT Short Term Goal 2 (Week 1): Pt will perform 3/3 toileting tasks with mod A OT Short Term Goal 3 (Week 1): Pt will bath 10/10 parts with min A sit to stand OT Short Term Goal 4 (Week 1): Pt will follow a one step command with only one cue for initation of task OT Short Term Goal 5 (Week 1): Pt will perform LB dressing with mod A  Skilled Therapeutic Interventions/Progress Updates:    ADL retraining with focus on functional mobility, transfers, and functional use of RUE during self-care tasks.  Pt ambulated to room shower without AD and contact guard.  Pt completed bathing at sit > stand level with supervision, omitting washing feet.  Pt demonstrating much improved initiation with self-care tasks and therapeutic activities.  Pt's husband reports having taken pt to bathroom this AM due to urgency, checked pt's husband off to provide recommended supervision for toilet transfers with pt demonstrating and verbalizing competency.  Ambulated to therapy gym without AD with supervision.  Engaged in [place] fine and gross motor tasks with focus on cognitive aspects with matching.  Pt demonstrating difficulty locating 3 matching cards in field of 9, but able to find matches of 2 (Spot It).  Completed 9 hole peg test with Rt: 48 seconds and Lt: 32 seconds.  Discussed results and noted mild uncoordinated movements on Rt.  Engaged in naming and sorting of small animal figurines to address word finding and initiation while sorting based on provided category.  O2 sats monitored throughout with pt maintaining 97% and higher on room air, with exception of dropping to 90% after shower but returning to mid 90s  in seconds.  Therapy Documentation Precautions:  Precautions Precautions: Fall Precaution Comments: R hemiparesis Restrictions Weight Bearing Restrictions: No Pain: Pain Assessment Pain Assessment: No/denies pain Pain Score: 0-No pain ADL: ADL ADL Comments: see functional navigator  See Function Navigator for Current Functional Status.   Therapy/Group: Individual Therapy  Simonne Come 05/18/2015, 12:05 PM

## 2015-05-18 NOTE — Progress Notes (Signed)
.    Subjective/Complaints: Had sensation for BM, still has foley Pt aphasic/apraxic  and HOH  Objective: Vital Signs: Blood pressure 121/49, pulse 74, temperature 97.9 F (36.6 C), temperature source Oral, resp. rate 18, height '5\' 6"'  (1.676 m), weight 78.654 kg (173 lb 6.4 oz), SpO2 95 %. No results found. Results for orders placed or performed during the hospital encounter of 05/13/15 (from the past 72 hour(s))  Basic metabolic panel     Status: Abnormal   Collection Time: 05/15/15  8:31 AM  Result Value Ref Range   Sodium 145 135 - 145 mmol/L   Potassium 3.8 3.5 - 5.1 mmol/L    Comment: SPECIMEN HEMOLYZED. HEMOLYSIS MAY AFFECT INTEGRITY OF RESULTS.   Chloride 108 101 - 111 mmol/L   CO2 27 22 - 32 mmol/L   Glucose, Bld 87 65 - 99 mg/dL   BUN 8 6 - 20 mg/dL   Creatinine, Ser 0.88 0.44 - 1.00 mg/dL   Calcium 8.5 (L) 8.9 - 10.3 mg/dL   GFR calc non Af Amer >60 >60 mL/min   GFR calc Af Amer >60 >60 mL/min    Comment: (NOTE) The eGFR has been calculated using the CKD EPI equation. This calculation has not been validated in all clinical situations. eGFR's persistently <60 mL/min signify possible Chronic Kidney Disease.    Anion gap 10 5 - 15      General: No acute distress Mood and affect are appropriate Heart: Regular rate and rhythm no rubs murmurs or extra sounds Lungs: Clear to auscultation, breathing unlabored, no rales or wheezes Abdomen: Positive bowel sounds, soft nontender to palpation, nondistended Extremities: No clubbing, cyanosis,2+ edema RUE, 1+ LUE, 1+ BLE Skin: No evidence of breakdown, no evidence of rash Neurologic: Cranial nerves II through XII intact, motor strength is 5/5 in bilateral deltoid, bicep, tricep, grip, hip flexor, knee extensors, ankle dorsiflexor and plantar flexor Sensory exam normal sensation to light touch and proprioception in bilateral upper and lower extremities Cerebellar exam normal finger to nose to finger as well as heel to shin  in bilateral upper and lower extremities Musculoskeletal: Full range of motion in all 4 extremities. No joint swelling  Assessment/Plan: 1. 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 Physiatrist is providing close team supervision and 24 hour management of active medical problems listed below. Physiatrist and rehab team continue to assess barriers to discharge/monitor patient progress toward functional and medical goals. FIM: Function - Bathing Position: Wheelchair/chair at sink Body parts bathed by patient: Right arm, Chest, Abdomen, Left arm, Front perineal area, Right upper leg, Left upper leg Body parts bathed by helper: Right lower leg, Left lower leg, Buttocks, Back Assist Level: Touching or steadying assistance(Pt > 75%)  Function- Upper Body Dressing/Undressing What is the patient wearing?: Pull over shirt/dress Pull over shirt/dress - Perfomed by patient: Thread/unthread right sleeve, Thread/unthread left sleeve, Put head through opening, Pull shirt over trunk Assist Level: Supervision or verbal cues, Set up Set up : To obtain clothing/put away Function - Lower Body Dressing/Undressing What is the patient wearing?: Pants, Shoes, Non-skid slipper socks Position: Wheelchair/chair at sink Pants- Performed by patient: Thread/unthread right pants leg, Thread/unthread left pants leg, Pull pants up/down Non-skid slipper socks- Performed by patient: Don/doff right sock, Don/doff left sock (doff) Non-skid slipper socks-  Performed by helper: Don/doff right sock, Don/doff left sock Shoes - Performed by patient: Don/doff right shoe, Don/doff left shoe Assist for footwear: Supervision/touching assist Assist for lower body dressing: Supervision or  verbal cues, Touching or steadying assistance (Pt > 75%)  Function - Toileting Toileting activity did not occur: No continent bowel/bladder event  Function - Air cabin crew transfer activity did not occur: N/A (had BM incontient in bed has catheter)  Function - Chair/bed transfer Chair/bed transfer method: Ambulatory Chair/bed transfer assist level: Touching or steadying assistance (Pt > 75%) Chair/bed transfer assistive device: Armrests Chair/bed transfer details: Verbal cues for sequencing, Verbal cues for technique, Verbal cues for precautions/safety  Function - Locomotion: Wheelchair Will patient use wheelchair at discharge?:  (tbd) Type: Manual Max wheelchair distance: 50 Assist Level: Moderate assistance (Pt 50 - 74%) Assist Level: Moderate assistance (Pt 50 - 74%) Assist Level: Dependent (Pt equals 0%) Turns around,maneuvers to table,bed, and toilet,negotiates 3% grade,maneuvers on rugs and over doorsills: No Function - Locomotion: Ambulation Assistive device: No device Max distance: 153f Assist level: Touching or steadying assistance (Pt > 75%) Assist level: Touching or steadying assistance (Pt > 75%) Assist level: Touching or steadying assistance (Pt > 75%) Assist level: Touching or steadying assistance (Pt > 75%) Assist level: Touching or steadying assistance (Pt > 75%)  Function - Comprehension Comprehension: Auditory Comprehension assist level: Understands basic 75 - 89% of the time/ requires cueing 10 - 24% of the time  Function - Expression Expression: Verbal Expression assist level: Expresses basic 75 - 89% of the time/requires cueing 10 - 24% of the time. Needs helper to occlude trach/needs to repeat words.  Function - Social Interaction Social Interaction assist level: Interacts appropriately 75 - 89% of the time - Needs redirection for appropriate language or to initiate interaction.  Function - Problem Solving Problem solving assist level:  Solves basic problems with no assist  Function - Memory Memory assist level: Recognizes or recalls 50 - 74% of the time/requires cueing 25 - 49% of the time Patient normally able to recall (first 3 days only): Staff names and faces, That he or she is in a hospital Medical Problem List and Plan: 1.  Dysphagia, aphasia, weakness secondary to left frontal SAH and acute infarct left caudate, putamen and small areas of infarct left gurus and insular cortex- Cont CIR 2.  DVT Prophylaxis/Anticoagulation: Mechanical: Sequential compression devices, below knee Bilateral lower extremities, Right cephalic DVT no need for tx , BLE without evidence of DVT 3. Pain Management: tylenol prn for pain.   4. Mood: LCSW for evaluation and support.   5. Neuropsych: This patient is not capable of making decisions on her own behalf. 6. Skin/Wound Care: routine pressure relief measures 7. Fluids/Electrolytes/Nutrition: Monitor I/O. Check lytes in am. 8. Aspiration PNA:  Continue Unasyn. Check follow up  , afebrile 9. Dysphagia: Strict aspiration precautions.   10  Tobacco abuse: Question COPD/OSA. Continue oxygen for now and wean as able.   11. Left ICA stent with thrombolysis: continue ASA/plavix and follow up with IVR in 6 weeks 12.  Hypokalemia- reviewed labs, recheck K 3.8 13.  Peripheral and pulmonary edema, clinically resolving on po lasix will d/c foley, 10 Liters net out, decrease lasix to 260mBID LOS (Days) 5 A FACE TO FACE EVALUATION WAS PERFORMED  KIRSTEINS,ANDREW E 05/18/2015, 8:09 AM

## 2015-05-19 ENCOUNTER — Inpatient Hospital Stay (HOSPITAL_COMMUNITY): Payer: Medicaid Other | Admitting: Speech Pathology

## 2015-05-19 ENCOUNTER — Inpatient Hospital Stay (HOSPITAL_COMMUNITY): Payer: Medicaid Other | Admitting: *Deleted

## 2015-05-19 ENCOUNTER — Inpatient Hospital Stay (HOSPITAL_COMMUNITY): Payer: Medicaid Other | Admitting: Occupational Therapy

## 2015-05-19 ENCOUNTER — Inpatient Hospital Stay (HOSPITAL_COMMUNITY): Payer: Medicaid Other | Admitting: Physical Therapy

## 2015-05-19 MED ORDER — FUROSEMIDE 20 MG PO TABS
20.0000 mg | ORAL_TABLET | Freq: Two times a day (BID) | ORAL | Status: DC
  Administered 2015-05-19 – 2015-05-21 (×4): 20 mg via ORAL
  Filled 2015-05-19 (×4): qty 1

## 2015-05-19 NOTE — Progress Notes (Signed)
Physical Therapy Session Note  Patient Details  Name: Monica Pugh MRN: [id number] Date of Birth: 13-Aug-1950  Today's Date: 05/19/2015 PT Individual Time: 1400-1500 PT Individual Time Calculation (min): 60 min   Short Term Goals: Week 1:  PT Short Term Goal 1 (Week 1): Pt will demonstrate dynamic standing balance with mod assist PT Short Term Goal 2 (Week 1): Pt will ambulate with LRAD x150' PT Short Term Goal 3 (Week 1): Pt will propel w/c x150' PT Short Term Goal 4 (Week 1): Pt will demonstrate selective attention x1 minute with min cues  Skilled Therapeutic Interventions/Progress Updates:    Pt received seated on EOB with husband present, denies pain and agreeable to treatment. Gait throughout session >150' per trial with S. Required mod verbal cues and increased time to complete 20 piece puzzle. Obstacle course with various obstacles including cones, weighted bars to step over, balance foam, 3" and 6" steps; all performed with S including while pt catching/throwing beach ball for cognitive dual task. Standing dynamic balance while playing horseshoes, including reaching to floor to retrieve horseshoes. Rocker board medial/lateral and anterior/posterior while catching/throwing ball with S. Returned to room with gait as above; pt able to verbalize all activities performed during session to husband with min cues. Remained seated in bed with husband present and all needs within reach at completion of session.   Therapy Documentation Precautions:  Precautions Precautions: Fall Precaution Comments: R hemiparesis Restrictions Weight Bearing Restrictions: No Pain: Pain Assessment Pain Assessment: No/denies pain Pain Score: 0-No pain   See Function Navigator for Current Functional Status.   Therapy/Group: Individual Therapy  Luberta Mutter 05/19/2015, 3:55 PM

## 2015-05-19 NOTE — Progress Notes (Signed)
Recreational Therapy Assessment and Plan  Patient Details  Name: Monica Pugh MRN: [id number] Date of Birth: [birth year] Today's Date: 05/19/2015    Assessment Clinical Impression: .  Problem List:  Patient Active Problem List   Diagnosis Date Noted  . Hemiparesis, aphasia, and dysphagia as late effect of cerebrovascular accident (CVA) ([place])   . Aspiration pneumonia of right middle lobe due to vomit ([place])   . Tobacco abuse   . Edema   . Internal carotid artery stenosis   . Acute ischemic left MCA stroke ([place])   . Cytotoxic brain edema ([place]) 05/11/2015  . Arterial ischemic stroke, MCA (middle cerebral artery), left, acute ([place]) 05/09/2015    Past Medical History: History reviewed. No pertinent past medical history. Past Surgical History:  Past Surgical History  Procedure Laterality Date  . Tubal ligation    . Radiology with anesthesia N/A 05/09/2015    Procedure: RADIOLOGY WITH ANESTHESIA; Surgeon: Luanne Bras, MD; Location: [place]; Service: Radiology; Laterality: N/A;    Assessment & Plan Clinical Impression: Patient is a 65 y.o. year old female in relatively good health who was admitted on 05/09/15 with left facial droop, difficulty handling secretions, difficulty speaking and unresponsiveness. History taken from chart review. CT head negative and she was transferred to [facility] for treatment. She was treated with IV tPA and intubated for airway protection. 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 Right ICA remained occluded and left ICA now patent. 2D echo with EF 55-60% with grade 1 diastolic dysfunction and mild  LAE. She had difficulty with vent wean with vomiting and possible RML aspiration PNA. She was started on Unasyn for treatment and tolerated extubation on 03/20.  Patient on ASA/Plavix for secondary stroke prevention. 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 swallow evaluation done and she was placed on dysphagia 2, thin liquids due to neurogenic dysphagia. Therapy evaluations done and patient limited by right hemiparesis, apraxia, question of visual deficits and difficulty following commands. 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 Patient transferred to CIR on 05/13/2015.  Met with pt to discuss TR services, importance of staying active and engaged in leisure activities post discharge.  Also discussed activity analysis in which pt and husband both stated understanding.  No further TR as pt expected to discharge in the next few days.       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, agricultural - Optician, dispensing Other Leisure Interests: Television;Cooking/Baking;Housework Leisure Participation Style: With Family/Friends Awareness of Community Resources: Good-identify 3 post discharge leisure resources Psychosocial / Spiritual Spiritual Interests: Church Patient agreeable to Pet Therapy: No Does patient have pets?: No Social interaction - Mood/Behavior: Cooperative Engineer, drilling for Education?: Yes Recreational Therapy Orientation Orientation -Reviewed with patient: Available activity resources Strengths/Weaknesses Patient Strengths/Abilities: Willingness to participate;Active premorbidly Patient weaknesses: Physical limitations The above assessment, treatment plan, treatment alternatives and goals were discussed and mutually agreed upon: by  patient  [place] 05/19/2015, 12:14 PM

## 2015-05-19 NOTE — Progress Notes (Signed)
Speech Language Pathology Daily Session Note  Patient Details  Name: Monica Pugh MRN: [id number] Date of Birth: [birth year]  Today's Date: 05/19/2015 SLP Individual Time: 1005-1130 SLP Individual Time Calculation (min): 85 min  Short Term Goals: Week 1: SLP Short Term Goal 1 (Week 1): Pt follow 2-step directives with min A. SLP Short Term Goal 2 (Week 1): Pt demonstrate verbal communication at the sentence level with mod A. SLP Short Term Goal 3 (Week 1): Pt demonstrate reading comprehension at the sentence level with mod A.  SLP Short Term Goal 4 (Week 1): Pt answer moderate level yes/no questions with min A. SLP Short Term Goal 5 (Week 1): Pt tolerate least restrictive PO as determined by MBSS with compensatory strategies with mod A. SLP Short Term Goal 5 - Progress (Week 1): Updated due to goal met SLP Short Term Goal 6 (Week 1): Pt tolerate regular consistency diet at mod I level.   Skilled Therapeutic Interventions:  Pt was seen for skilled ST targeting goals for communication and dysphagia.  SLP facilitated the session with a trial of regular textures to continue working towards diet progression.  Pt demonstrated effective mastication and clearance of advanced textures from the oral cavity post swallow without difficulty.  No overt s/s of aspiration evident with solids or liquids.  Recommend a trial meal tray of regular textures at next available appointment prior to advancement.  Pt was able to describe pictures of basic, familiar actions at the phrase level with min assist faded to supervision cues for expansion of utterances.  SLP further increased task challenge with a sentence generation task targeting communication at the sentence level.  Pt was able to generate a grammatically correct and appropriate sentence when given 2 targeted words with max faded to min assist verbal cues and more than a reasonable amount of time.  Pt ambulated back to her room with mod I route recall.  Pt was left  in her room on the bed with husband at bedside.     Function:  Eating Eating                 Cognition Comprehension Comprehension assist level: Understands basic 90% of the time/cues < 10% of the time  Expression   Expression assist level: Expresses basic 75 - 89% of the time/requires cueing 10 - 24% of the time. Needs helper to occlude trach/needs to repeat words.  Social Interaction Social Interaction assist level: Interacts appropriately 75 - 89% of the time - Needs redirection for appropriate language or to initiate interaction.  Problem Solving Problem solving assist level: Solves basic 75 - 89% of the time/requires cueing 10 - 24% of the time  Memory Memory assist level: Recognizes or recalls 75 - 89% of the time/requires cueing 10 - 24% of the time    Pain Pain Assessment Pain Assessment: No/denies pain Pain Score: 0-No pain  Therapy/Group: Individual Therapy  Page, Selinda [place] 05/19/2015, 12:52 PM

## 2015-05-19 NOTE — Progress Notes (Signed)
.    Subjective/Complaints: Foley out and voiding continently Pt aphasic/apraxic  and HOH  Objective: Vital Signs: Blood pressure 137/48, pulse 72, temperature 98.6 F (37 C), temperature source Oral, resp. rate 18, height 5\' 6"  (1.676 m), weight 80.513 kg (177 lb 8 oz), SpO2 94 %. No results found. No results found for this or any previous visit (from the past 72 hour(s)).    General: No acute distress Mood and affect are appropriate Heart: Regular rate and rhythm no rubs murmurs or extra sounds Lungs: Clear to auscultation, breathing unlabored, no rales or wheezes Abdomen: Positive bowel sounds, soft nontender to palpation, nondistended Extremities: No clubbing, cyanosis,1+ edema RUE, trace LUE, trace BLE Skin: No evidence of breakdown, no evidence of rash Neurologic: Cranial nerves II through XII intact, motor strength is 5/5 in bilateral deltoid, bicep, tricep, grip, hip flexor, knee extensors, ankle dorsiflexor and plantar flexor Sensory exam normal sensation to light touch and proprioception in bilateral upper and lower extremities Cerebellar exam normal finger to nose to finger as well as heel to shin in bilateral upper and lower extremities Musculoskeletal: Full range of motion in all 4 extremities. No joint swelling  Assessment/Plan: 1. 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 Physiatrist is providing close team supervision and 24 hour management of active medical problems listed below. Physiatrist and rehab team continue to assess barriers to discharge/monitor patient progress toward functional and medical goals. FIM: Function - Bathing Bathing activity did not occur: Refused Position: Shower Body parts bathed by patient:  Right arm, Chest, Abdomen, Left arm, Front perineal area, Right upper leg, Left upper leg, Buttocks Body parts bathed by helper: Right lower leg, Left lower leg, Buttocks, Back Bathing not applicable: Right lower leg, Left lower leg, Back Assist Level: Supervision or verbal cues  Function- Upper Body Dressing/Undressing What is the patient wearing?: Bra, Pull over shirt/dress Bra - Perfomed by patient: Thread/unthread right bra strap, Thread/unthread left bra strap, Hook/unhook bra (pull down sports bra) Pull over shirt/dress - Perfomed by patient: Thread/unthread right sleeve, Thread/unthread left sleeve, Put head through opening, Pull shirt over trunk Assist Level: Supervision or verbal cues Set up : To obtain clothing/put away Function - Lower Body Dressing/Undressing What is the patient wearing?: Pants, Non-skid slipper socks Position: Sitting EOB Pants- Performed by patient: Thread/unthread right pants leg, Thread/unthread left pants leg, Pull pants up/down Non-skid slipper socks- Performed by patient: Don/doff right sock, Don/doff left sock Non-skid slipper socks- Performed by helper: Don/doff right sock, Don/doff left sock Shoes - Performed by patient: Don/doff right shoe, Don/doff left shoe Assist for footwear: Supervision/touching assist Assist for lower body dressing: Supervision or verbal cues  Function - Toileting Toileting activity did not occur: No continent bowel/bladder event Toileting steps completed by patient: Adjust clothing prior to toileting, Performs perineal hygiene, Adjust clothing after toileting Toileting Assistive Devices: Grab bar or rail Assist level: Supervision or verbal cues  Function Midwife transfer activity did not occur: N/A (had BM incontient in bed has catheter)  Function - Chair/bed transfer Chair/bed transfer method: Ambulatory Chair/bed transfer assist level: Supervision or verbal cues Chair/bed transfer assistive device:  Armrests Chair/bed transfer details: Verbal cues for sequencing, Verbal cues for technique, Verbal cues for precautions/safety  Function - Locomotion: Wheelchair Will patient use wheelchair  at discharge?:  (tbd) Type: Manual Max wheelchair distance: 50 Assist Level: Moderate assistance (Pt 50 - 74%) Assist Level: Moderate assistance (Pt 50 - 74%) Assist Level: Dependent (Pt equals 0%) Turns around,maneuvers to table,bed, and toilet,negotiates 3% grade,maneuvers on rugs and over doorsills: No Function - Locomotion: Ambulation Assistive device: No device Max distance: 150 Assist level: Supervision or verbal cues Assist level: Supervision or verbal cues Assist level: Supervision or verbal cues Assist level: Supervision or verbal cues Assist level: Touching or steadying assistance (Pt > 75%)  Function - Comprehension Comprehension: Auditory Comprehension assist level: Understands basic 90% of the time/cues < 10% of the time  Function - Expression Expression: Verbal Expression assist level: Expresses complex 90% of the time/cues < 10% of the time  Function - Social Interaction Social Interaction assist level: Interacts appropriately 90% of the time - Needs monitoring or encouragement for participation or interaction.  Function - Problem Solving Problem solving assist level: Solves basic 90% of the time/requires cueing < 10% of the time  Function - Memory Memory assist level: Recognizes or recalls 90% of the time/requires cueing < 10% of the time Patient normally able to recall (first 3 days only): Current season Medical Problem List and Plan: 1.  Dysphagia, aphasia, weakness secondary to left frontal SAH and acute infarct left caudate, putamen and small areas of infarct left gurus and insular cortex- Cont CIR, team conf in am 2.  DVT Prophylaxis/Anticoagulation: Mechanical: Sequential compression devices, below knee Bilateral lower extremities, Right cephalic DVT no need for tx ,  BLE without evidence of DVT 3. Pain Management: tylenol prn for pain.   4. Mood: LCSW for evaluation and support.   5. Neuropsych: This patient is not capable of making decisions on her own behalf. 6. Skin/Wound Care: routine pressure relief measures 7. Fluids/Electrolytes/Nutrition: Monitor I/O. Check lytes in am. 8. Aspiration PNA:  Continue Unasyn. Check follow up  , afebrile, lung exam normal 9. Dysphagia: Strict aspiration precautions.   10  Tobacco abuse: Question COPD/OSA. Continue oxygen for now and wean as able.   11. Left ICA stent with thrombolysis: continue ASA/plavix and follow up with IVR in 6 weeks 12.  Hypokalemia- reviewed labs, recheck K 3.8 13.  Peripheral and pulmonary edema, clinically resolving on po lasix will d/c foley, 9.8 Liters net out, decrease lasix to 20mg  daily LOS (Days) 6 A FACE TO FACE EVALUATION WAS PERFORMED  KIRSTEINS,ANDREW E 05/19/2015, 8:15 AM

## 2015-05-19 NOTE — Progress Notes (Signed)
Occupational Therapy Session Note  Patient Details  Name: Monica Pugh MRN: [id number] Date of Birth: 1950-09-19  Today's Date: 05/19/2015 OT Individual Time: 0701-0759 OT Individual Time Calculation (min): 58 min    Short Term Goals: Week 1:  OT Short Term Goal 1 (Week 1): Pt will transfer to toilet with steadying A OT Short Term Goal 2 (Week 1): Pt will perform 3/3 toileting tasks with mod A OT Short Term Goal 3 (Week 1): Pt will bath 10/10 parts with min A sit to stand OT Short Term Goal 4 (Week 1): Pt will follow a one step command with only one cue for initation of task OT Short Term Goal 5 (Week 1): Pt will perform LB dressing with mod A  Skilled Therapeutic Interventions/Progress Updates:  Upon entering the room, pt seated on EOB with breakfast tray and husband present in room. Pt with no c/o pain this session. Pt ambulating without use of AD to sink for grooming tasks in standing with supervision. Pt standing to don bra and pull over shirt with supervision as well. Pt ambulating to gym for 10 reps of sit <>stand with B LEs only this session. Pt performing task without difficulty. OT educated and demonstrated fine motor coordination exercises with pt returning demonstrations with min verbal and tactile cues for proper techniques for palm translation task, opposition, card tasks, and finger isolation activities. Pt returning to room at end of session with husband remaining in room. Call bell and all needed items within reach upon exiting the room.   Therapy Documentation Precautions:  Precautions Precautions: Fall Precaution Comments: R hemiparesis Restrictions Weight Bearing Restrictions: No General:   Vital Signs: Therapy Vitals Temp: 98.6 F (37 C) Temp Source: Oral Pulse Rate: 72 Resp: 18 BP: (!) 137/48 mmHg Patient Position (if appropriate): Lying Oxygen Therapy SpO2: 94 % O2 Device: Not Delivered ADL: ADL ADL Comments: see functional navigator  See Function  Navigator for Current Functional Status.   Therapy/Group: Individual Therapy  [name] 05/19/2015, 7:59 AM

## 2015-05-19 NOTE — Discharge Summary (Signed)
Physician Discharge Summary  Patient ID: Monica Pugh MRN: [id number] DOB/AGE: 05-23-1950 65 y.o.  Admit date: 05/09/2015 Discharge date: 05/13/2015  Admission Diagnoses:Stroke code  Discharge Diagnoses: Left middle cerebral artery distribution infarct treated with TPA with subsequent left medial frontal post tpa  hemorrrhagic transformation Treated with IV and IA tPA with proximal left internal carotid artery angioplasty and stenting and revascularization of the superior division of the left MCA using solitaire device Active Problems:   Arterial ischemic stroke, MCA (middle cerebral artery), left, acute (HCC)   Cytotoxic brain edema (HCC)   Acute ischemic left MCA stroke ([place]) Hypertension Hyperlipidimia Chronic right internal carotid artery occlusion   Discharged Condition: fair  Hospital Course: [name] is an 65 y.o. female patient who was brought in via EMS for evaluation of altered mental status and unresponsiveness. Patient with no symptom past medical history. Does not take any medications at home except for occasional acid pills. Her has been last spoke to the patient at 10 AM, which is the time she was last seen normal. When he returned home after some shopping errands, he found her unresponsive, nonverbal and not following commands. EMS were called and brought her to the ER.  EMS Arrival date & time 05/09/15 1137 Patient arrived in the ER room after CT at 1155 IV TPA initiated at 1205Date last known well: 05/09/2015 Time last known well: 10 am tPA Given: Yes  CT angiogram with perfusion showed a cold area of infarct left basal ganglia and frontal temporal lobe with a penumbra which was larger than the core in less than month or other MCA territory wasn't involved. The patient was intubated in the ED 5 a protection and went to interventional radiology for clot retrieval. Cerebral catheter angiogram showed a high-grade proximal left ICA stenosis with distal occlusion of the  superior division of the left middle cerebral artery which was completely revascularized using 10 mg of superselective intracranial intra-arterial TPA and one pass of the solitaire device. The proximal left ICA stenosis requiring stent assisted angioplasty with IntraStent thrombus treated with 20 mg of intra-arterial Integrilin, family valve intra-arterial TPA and 6 mg of intra-arterial ReoPro. Postprocedure scan showed right basal ganglia hyperdensity raising concern for hematoma. Patient was kept and then developed intermittent blood pressure was tightly controlled. Pulmonary critical care services were consulted to help with ventilatory management. Repeat CT scan showed significant resolution of the right basal ganglia hyperdensity suggesting that it was mostly diet. MRI scan showed mild subarachnoid hemorrhage on the left and hemorrhagic infarct. There are scattered small areas of acute infarct in the left gyrus rectus, insular cortex and left frontoparietal cortex. Right internal carotid artery was occluded which was a chronic finding.Transthoracic echo showed normal ejection fraction. LDL cholesterol was 82 mg percent and hemoglobin A1c was 5.7. Patient was extubated and made gradual improvement. He had right hemiparesis on exam and expressive greater than receptive aphasia. He was seen by physical occupational speech therapy and felt to be a good candidate for inpatient rehabilitation and initial plan was to transfer the patient to the floor but a rehabilitation bed was available and hence was transferred directly from the neuro ICU to rehabilitation on 05/13/15  Consults: pulmonary/intensive care and rehabilitation medicine, interventional neuroradiology  Significant Diagnostic Studies:  Ct Angio Head and Neck W/cm &/or Wo Cm 05/09/2015  Acute thrombus left M2 segment supplying the left temporal lobe. Occlusion of the proximal left internal carotid carotid artery which is presumably acute. Severe  stenosis proximal innominate artery.  Occlusion of the proximal right common carotid artery. This may be chronic. There is reconstitution of the right internal carotid artery in the supraclinoid segment. Right anterior and middle cerebral arteries are patent bilaterally. Endotracheal tube right main bronchus. Recommend withdrawal 3 cm.     Ct Head Wo Contrast 05/09/2015  Contrast opacified acute left subarachnoid bleed as described above.     Ct Head Wo Contrast 05/09/2015  Increased attenuation in the left middle cerebral artery compared to the right side. This finding is concerning for early acute infarct in the left middle cerebral artery distribution. Somewhat unusual appearing area of decreased attenuation in the medial left frontal lobe. This area may warrant pre and post intravenous contrast brain MRI to assess for possible atypical mass in this area. Atypical infarct in this area is possible, but the distribution and appearance is somewhat unusual for infarct.     [name] Contrast 05/10/2015  Subarachnoid hemorrhage on the left. Left frontal hematoma likely related to hemorrhagic infarction. Acute infarct in the left caudate and putamen. Scattered small areas of acute infarct involving the left gyrus rectus and insular cortex. Scattered small areas of acute infarct in the left frontal parietal cortex and in the right frontal cortex. The right internal carotid artery remains occluded. Left internal carotid artery now appears patent through the cavernous segment.     Ct Cerebral Perfusion W/cm 05/09/2015  Core infarct in the left basal ganglia and frontal temporal lobe. There is an area of penumbra which is larger than the core infarct involves the left temporal and frontal lobe. Less than 1/3 of MCA territory involved.     Dg Chest Port 1 View 05/10/2015  1. Endotracheal tube tip is 5 cm above the carina.  2. Possible trace left pleural effusion.  3. Decreased  right mid lung opacity, likely decreased atelectasis.  4. Stable left lung base opacity, either atelectasis or pneumonia.    Portable Chest Xray 05/09/2015  1. Well-positioned support structures as described.  2. New hazy opacity in the right mid lung, probably atelectasis, cannot exclude mild pulmonary edema or aspiration.  3. Increased left basilar opacity, probably atelectasis.   Dg Chest Portable 1 View 05/09/2015  1. The ET tube is low lying but does not extend into either mainstem bronchus. Recommend withdrawing 1 or 1.5 cm.  2. Mild interstitial prominence may be due to technique. Recommend attention on follow-up    Dg Abd Portable 1v 05/09/2015  Orogastric tube tip in the mid stomach.    Dg Abd Portable 1v 05/09/2015  Enteric tube terminates in the gastric fundus. Nonobstructive bowel gas pattern.     CEREBRAL ANGIOGRAM [id number] (Custom)]      Expand All Collapse All  S/P innominate artery angiogram,Lt common carotid arteriogram,Lt Vert angiogram,followed by complete revascularization of occluded sup division Lt ICA using 10 mg of superselective intracranial tpa and x 1 pass with Solitaire 50mm x 63mm retrieval device ,and complete revascularization of symptomatic occluded LT ICA prox with stentassisted angioplasty and rescue fibrinilysis of intrastent thrombus with 20 mg Of IA Integrelin ,5mg  of IA TPA and 6 mg of IA Reopro    Carotid dopplers : 05/13/15 :Left carotid stent appears patent, however 50-75% stenosis noted in the left proximal ICA, based on stent criteria. Calcific plaque is noted with acoustic shadowing, therefore higher velocities may not be ruled out. Also, >50% left ECA stenosis       Discharge Exam: Blood pressure 154/74, pulse 66, temperature 99.7 F (  37.6 C), temperature source Oral, resp. rate 28, height 5\' 6"  (1.676 m), weight 187 lb 2.7 oz (84.9 kg), SpO2 97 %.   PHYSICAL EXAM 65 year old Caucasian female. Afebrile. Head is  nontraumatic. Neck is supple without bruit. Cardiac exam no murmur or gallop. Skin is warm and distally well perfused. Neurological Exam :Awake laert Does follow commands [place]. Expressive greater than receptive aphasia. Pupils equally round and reactive to light. Conjugate midline gaze. Intact cough and gag. Intact oculocephalic reflex.Mild right lower facial symmetry Mild right hemiparesis. 2/5 RUE and 4/5 RLE strength but effort poor and variable.. Withdraws bilaterally in the lowers but not the uppers left more than right side. Right toe is up and left toe is down.   Disposition: 02-Transferred to [facility] for inpatient rehab      Discharge Instructions    Diet - low sodium heart healthy    Complete by:  As directed      Increase activity slowly    Complete by:  As directed            Home Discharge Medications   Medication List    TAKE these medications        acetaminophen 325 MG tablet  Commonly known as:  TYLENOL  Take 650 mg by mouth every 6 (six) hours as needed.     aspirin 325 MG tablet  Take 1 tablet (325 mg total) by mouth daily.     clopidogrel 75 MG tablet  Commonly known as:  PLAVIX  Take 1 tablet (75 mg total) by mouth daily with breakfast.     ipratropium-albuterol 0.5-2.5 (3) MG/3ML Soln  Commonly known as:  DUONEB  Take 3 mLs by nebulization every 4 (four) hours as needed.     pantoprazole 40 MG tablet  Commonly known as:  PROTONIX  Take 40 mg by mouth 2 (two) times daily.        If d/c to Inpatient Rehab, Medications to to continued on Rehab  Follow up in stroke clinic in 2 months  35 minutes spent on discharge summary   Signed: SETHI,PRAMOD 05/19/2015, 9:35 AM

## 2015-05-20 ENCOUNTER — Inpatient Hospital Stay (HOSPITAL_COMMUNITY): Payer: Medicaid Other | Admitting: Physical Therapy

## 2015-05-20 ENCOUNTER — Inpatient Hospital Stay (HOSPITAL_COMMUNITY): Payer: Medicaid Other | Admitting: Occupational Therapy

## 2015-05-20 ENCOUNTER — Inpatient Hospital Stay (HOSPITAL_COMMUNITY): Payer: Medicaid Other | Admitting: Speech Pathology

## 2015-05-20 NOTE — Plan of Care (Signed)
Problem: RH Awareness Goal: LTG: Patient will demonstrate intellectual/emergent (SLP) LTG: Patient will demonstrate intellectual/emergent/anticipatory awareness with assist during a cognitive/linguistic activity (SLP)  Outcome: Not Met (add Reason) Pt currently only demonstrating intellectual and emergent awareness of deficits

## 2015-05-20 NOTE — Progress Notes (Signed)
Occupational Therapy Session Note  Patient Details  Name: Monica Pugh MRN: [id number] Date of Birth: 05/06/1950  Today's Date: 05/20/2015 OT Individual Time: 0915-1000 OT Individual Time Calculation (min): 45 min    Short Term Goals: Week 1:  OT Short Term Goal 1 (Week 1): Pt will transfer to toilet with steadying A OT Short Term Goal 2 (Week 1): Pt will perform 3/3 toileting tasks with mod A OT Short Term Goal 3 (Week 1): Pt will bath 10/10 parts with min A sit to stand OT Short Term Goal 4 (Week 1): Pt will follow a one step command with only one cue for initation of task OT Short Term Goal 5 (Week 1): Pt will perform LB dressing with mod A  Skilled Therapeutic Interventions/Progress Updates:    Treatment session with focus on functional transfers and dynamic standing balance.  Pt declined bathing and dressing this session.  Reiterated OT goals and POC with recommendation to complete shower with therapy tomorrow prior to D/C home with pt reporting understanding.  Ambulated to ADL apt without AD and supervision.  Pt completed tub/shower transfer stepping over tub ledge without assist and stabilizing self against wall for stability.  Recommend use of shower seat in shower when bathing due to decreased single leg stance and balance, pt and husband report understanding of recommendation.  Engaged in balance activity with Wii fit requiring pt to complete weight shifting and static standing, therapist providing supervision with extreme weight shifts but no LOB.  Alternating toe taps on cones with focus on increasing stability in single leg stance for LB bathing and dressing tasks as well as overall mobility.  Close supervision with toe taps.  Returned to room and discussed d/c planning and pt's husband reports daughter is available during day to provide supervision.  Therapy Documentation Precautions:  Precautions Precautions: Fall Precaution Comments: R hemiparesis Restrictions Weight Bearing  Restrictions: No Pain: Pain Assessment Pain Assessment: No/denies pain Pain Score: 0-No pain ADL: ADL ADL Comments: see functional navigator  See Function Navigator for Current Functional Status.   Therapy/Group: Individual Therapy  Simonne Come 05/20/2015, 11:09 AM

## 2015-05-20 NOTE — Progress Notes (Signed)
Physical Therapy Discharge Summary  Patient Details  Name: Monica Pugh MRN: [id number] Date of Birth: 04-18-1950  Today's Date: 05/20/2015 PT Individual Time: 1135-1200 and 1300-1400 PT Individual Time Calculation (min): 25 min and 60 min (total 85 min)    Patient has met 8 of 8 long term goals due to improved activity tolerance, improved balance, improved postural control, increased strength, ability to compensate for deficits, functional use of  right upper extremity and right lower extremity, improved attention, improved awareness and improved coordination.  Patient to discharge at an ambulatory level Supervision.   Patient's care partner is independent to provide the necessary physical and cognitive assistance at discharge.  Reasons goals not met: All goals met   Recommendation:  Patient does not require follow up PT at this time as she is at baseline level for mobility. Have given pt HEP   Equipment: No equipment provided  Reasons for discharge: treatment goals met  Patient/family agrees with progress made and goals achieved: Yes  PT Discharge Precautions/Restrictions Precautions Precautions: Fall Restrictions Weight Bearing Restrictions: No Pain Pain Assessment Pain Assessment: No/denies pain Pain Score: 0-No pain Vision/Perception    [facility] Cognition Orientation Level: Oriented X4 Sensation   Motor  Motor Motor: Within Functional Limits Motor - Discharge Observations: motor WFL  Mobility Bed Mobility Bed Mobility: Supine to Sit;Sit to Supine Supine to Sit: 6: Modified independent (Device/Increase time) Sit to Supine: 6: Modified independent (Device/Increase time) Transfers Transfers: Yes Sit to Stand: 6: Modified independent (Device/Increase time) Stand to Sit: 6: Modified independent (Device/Increase time) Stand Pivot Transfers: 6: Modified independent (Device/Increase time) Locomotion  Ambulation Ambulation: Yes Ambulation/Gait Assistance: 5:  Supervision Ambulation Distance (Feet): 500 Feet Assistive device: None Ambulation/Gait Assistance Details: Verbal cues for precautions/safety Gait Gait: Yes Gait Pattern: Within Functional Limits Gait velocity: WFL for age/gender norms High Level Ambulation High Level Ambulation: Side stepping;Backwards walking;Sudden stops;Direction changes;Head turns Side Stepping: S Backwards Walking: S, increased time Direction Changes: S Sudden Stops: S Head Turns: S Stairs / Additional Locomotion Stairs: Yes Stairs Assistance: 5: Supervision Stairs Assistance Details: Verbal cues for gait pattern;Verbal cues for safe use of DME/AE;Manual facilitation for weight shifting;Manual facilitation for placement Stair Management Technique: No rails Number of Stairs: 12 Height of Stairs: 6 Ramp: 5: Supervision Curb: 5: Supervision Wheelchair Mobility Wheelchair Mobility: No  Trunk/Postural Assessment  Cervical Assessment Cervical Assessment: Within Functional Limits Thoracic Assessment Thoracic Assessment: Within Functional Limits Lumbar Assessment Lumbar Assessment: Within Functional Limits Postural Control Postural Control: Within Functional Limits  Balance Balance Balance Assessed: Yes Standardized Balance Assessment Standardized Balance Assessment: Berg Balance Test;Dynamic Gait Index Berg Balance Test Sit to Stand: Able to stand without using hands and stabilize independently Standing Unsupported: Able to stand safely 2 minutes Sitting with Back Unsupported but Feet Supported on Floor or Stool: Able to sit safely and securely 2 minutes Stand to Sit: Sits safely with minimal use of hands Transfers: Able to transfer safely, minor use of hands Standing Unsupported with Eyes Closed: Able to stand 10 seconds safely Standing Ubsupported with Feet Together: Able to place feet together independently and stand 1 minute safely From Standing, Reach Forward with Outstretched Arm: Can reach  confidently >25 cm (10") From Standing Position, Pick up Object from Floor: Able to pick up shoe safely and easily From Standing Position, Turn to Look Behind Over each Shoulder: Looks behind from both sides and weight shifts well Turn 360 Degrees: Able to turn 360 degrees safely in 4 seconds or less Standing Unsupported, Alternately Place  Feet on Step/Stool: Able to stand independently and safely and complete 8 steps in 20 seconds Standing Unsupported, One Foot in Front: Able to plae foot ahead of the other independently and hold 30 seconds Standing on One Leg: Able to lift leg independently and hold > 10 seconds Total Score: 55 Dynamic Gait Index Level Surface: Normal Change in Gait Speed: Normal Gait with Horizontal Head Turns: Normal Gait with Vertical Head Turns: Normal Gait and Pivot Turn: Mild Impairment Step Over Obstacle: Normal Step Around Obstacles: Normal Steps: Normal Total Score: 23 Static Sitting Balance Static Sitting - Balance Support: No upper extremity supported;Feet supported Static Sitting - Level of Assistance: 6: Modified independent (Device/Increase time) Dynamic Sitting Balance Dynamic Sitting - Balance Support: No upper extremity supported;During functional activity;Feet supported Dynamic Sitting - Level of Assistance: 6: Modified independent (Device/Increase time) Static Standing Balance Static Standing - Balance Support: No upper extremity supported Static Standing - Level of Assistance: 6: Modified independent (Device/Increase time) Static Stance: Eyes closed Static Stance: Eyes Closed: S x30 sec Dynamic Standing Balance Dynamic Standing - Balance Support: No upper extremity supported;During functional activity Dynamic Standing - Level of Assistance: 5: Stand by assistance Dynamic Standing - Balance Activities: Reaching across midline;Reaching for objects;Reaching for weighted objects;Forward lean/weight shifting;Lateral lean/weight shifting Extremity  Assessment  RUE Assessment RUE Assessment: Within Functional Limits LUE Assessment LUE Assessment: Within Functional Limits RLE Assessment RLE Assessment: Within Functional Limits LLE Assessment LLE Assessment: Within Functional Limits  Skilled Therapeutic Intervention: Tx 1: Pt received supine in bed, husband present for session and education prior to d/c. Denies pain and agreeable to treatment. Stairs 2x 12; first trial on corner stairs with no handrails, reciprocal pattern ascent and step-to descent with S. Second trial in stairwell with one handrail, S and gait pattern as above. Educated pt and husband in falls risk, safety in home, and safety in the event of a fall. Pt demonstrates floor >stand transfer with distant S. Gait 2x500' with distant S in community environment. Remained seated on EOB with setup for lunch at completion of session; husband present and all needs within reach.   Tx 2: Pt received seated in bed; denies pain and agreeable to treatment. Assessed remaining mobility as described above in anticipation of d/c home tomorrow; requires distant S overall for mobility due to cognitive impairments. Berg and DGI as assessed above with pt low risk for recurrent falls. Nustep x15 min with BUE/BLE on level 5 for strengthening, aerobic endurance.  Gait in community setting including gift shop and outdoors on level/unlevel surfaces, including stairs. Pt became mildly SOB after ascending two flights of stairs, recovered after short seated rest break. Pt and husband with no questions/concerns regarding d/c, husband feels confident that they can manage pt between himself and family assisting. Pt remained seated on EOB at completion of session, all needs within reach.   See Function Navigator for Current Functional Status.  Benjiman Core Tygielski 05/20/2015, 3:47 PM

## 2015-05-20 NOTE — Progress Notes (Signed)
Speech Language Pathology Discharge Summary  Patient Details  Name: Monica Pugh MRN: [id number] Date of Birth: 1951-01-12  Today's Date: 05/20/2015 SLP Individual Time: 0800-0830; 1433-1500 SLP Individual Time Calculation (min): 30 min; 27 min   Skilled Therapeutic Interventions:   Session 1:  Pt was seen for skilled ST targeting goals for dysphagia and communication.  Pt had already eaten breakfast prior to SLP's arrival; however, SLP facilitated the session with a trial snack of regular textures to continue working towards diet progression.  Pt consumed regular textures and thin liquids with mod I use of swallowing precautions without overt s/s of aspiration.  Pt demonstrated adequate mastication of solids with complete clearance of solids from the oral cavity.  Recommend advancing pt's diet to regular, thin liquids, intermittent supervision for use of swallowing precautions.  Pt's husband was present and SLP provided education regarding compensatory strategies to maximize pt's communication independence and expansion of utterances.  SLP specifically emphasized allowing pt extra time to communicate and providing planned opportunities for failure that force pt to communicate beyond word and phrase level.  All pt's husband's questions were answered to his satisfaction at this time.  Pt was left sitting at edge of bed with husband at bedside. Continue per current plan of care.     Session 2:  Pt was seen for skilled ST targeting goals for dysphagia and completion of family education. Pt's husband reports excellent toleration of recent diet upgrade at lunch today.  SLP facilitated the session with trials of thin liquids via straw to continue working on diet progression.  Pt consumed straw sips of thin liquids without overt s/s of aspiration, despite being challenged to take large, consecutive boluses.  Recommend discharging no straws recommendation.  SLP also completed skilled education with pt's husband  regarding communication strategies to maximize pt's functional independence for conveying needs and wants to familiar and unfamiliar partners.  A handout was provided to facilitated carryover in the home environment.  All pt's and pt's husband's questions were answered to their satisfaction at this time.  Pt was left sitting at edge of bed with husband at bedside.         Patient has met 6 of 7 long term goals.  Patient to discharge at overall Supervision;Min level.  Reasons goals not met: pt only demonstrating intellectual and emergent awarenes of deficits    Clinical Impression/Discharge Summary:  Pt made excellent gains while inpatient and is discharging having met 6 out of 7 long term goals. Pt is back to consuming regular textures and thin liquids with no restrictions, mod I use of universal swallowing precautions.  Pt is overall min assist for expansion of utterances to the phrase and sentence level and is able to follow multi-step commands with supervision cues and extra time.  Pt continues to demonstrate impulsivity with movement so continue to recommend 24/7 supervision which husband and family are willing and able to provide.  Recommend ongoing ST follow up at next level of care to continue to address cognitive-linguistic function in order to maximize functional independence and reduce burden of care.  Pt and family education is complete at this time.    Care Partner:  Caregiver Able to Provide Assistance: Yes  Type of Caregiver Assistance: Physical;Cognitive  Recommendation:  Outpatient SLP;Home Health SLP  Rationale for SLP Follow Up: Maximize functional communication;Maximize cognitive function and independence;Reduce caregiver burden   Equipment: none recommended by SLP    Reasons for discharge: Discharged from hospital   Patient/Family Agrees with  Progress Made and Goals Achieved: Yes   Function:  Eating Eating   Modified Consistency Diet: No Eating Assist Level:  Swallowing techniques: self managed           Cognition Comprehension Comprehension assist level: Follows basic conversation/direction with extra time/assistive device  Expression   Expression assist level: Expresses basic 75 - 89% of the time/requires cueing 10 - 24% of the time. Needs helper to occlude trach/needs to repeat words.  Social Interaction Social Interaction assist level: Interacts appropriately 75 - 89% of the time - Needs redirection for appropriate language or to initiate interaction.  Problem Solving Problem solving assist level: Solves basic 75 - 89% of the time/requires cueing 10 - 24% of the time  Memory Memory assist level: Recognizes or recalls 75 - 89% of the time/requires cueing 10 - 24% of the time   Emilio Math 05/20/2015, 4:23 PM

## 2015-05-20 NOTE — Progress Notes (Signed)
.    Subjective/Complaints: Discussed with SLP diet upgraded to normal consistency Discussed progress with pt and husband Pt aphasic/apraxic  and HOH  Objective: Vital Signs: Blood pressure 123/58, pulse 84, temperature 98.2 F (36.8 C), temperature source Oral, resp. rate 18, height 5' 6" (1.676 m), weight 79.8 kg (175 lb 14.8 oz), SpO2 93 %. No results found. No results found for this or any previous visit (from the past 72 hour(s)).    General: No acute distress Mood and affect are appropriate Heart: Regular rate and rhythm no rubs murmurs or extra sounds Lungs: Clear to auscultation, breathing unlabored, no rales or wheezes Abdomen: Positive bowel sounds, soft nontender to palpation, nondistended Extremities: No clubbing, cyanosis,1+ edema RUE, trace LUE, trace BLE Skin: No evidence of breakdown, no evidence of rash Neurologic: Cranial nerves II through XII intact, motor strength is 5/5 in bilateral deltoid, bicep, tricep, grip, hip flexor, knee extensors, ankle dorsiflexor and plantar flexor Sensory exam normal sensation to light touch and proprioception in bilateral upper and lower extremities Cerebellar exam normal finger to nose to finger as well as heel to shin in bilateral upper and lower extremities Musculoskeletal: Full range of motion in all 4 extremities. No joint swelling  Assessment/Plan: 1. 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 Physiatrist is providing close team supervision and 24 hour management of active medical problems listed below. Physiatrist and rehab team continue to assess barriers to discharge/monitor patient progress toward functional and medical goals. FIM: Function - Bathing Bathing activity did not  occur: Refused Position: Shower Body parts bathed by patient: Right arm, Chest, Abdomen, Left arm, Front perineal area, Right upper leg, Left upper leg, Buttocks Body parts bathed by helper: Right lower leg, Left lower leg, Buttocks, Back Bathing not applicable: Right lower leg, Left lower leg, Back Assist Level: Supervision or verbal cues  Function- Upper Body Dressing/Undressing What is the patient wearing?: Bra, Pull over shirt/dress Bra - Perfomed by patient: Thread/unthread right bra strap, Thread/unthread left bra strap, Hook/unhook bra (pull down sports bra) Pull over shirt/dress - Perfomed by patient: Thread/unthread right sleeve, Thread/unthread left sleeve, Put head through opening, Pull shirt over trunk Assist Level: Supervision or verbal cues Set up : To obtain clothing/put away Function - Lower Body Dressing/Undressing What is the patient wearing?: Pants, Non-skid slipper socks Position: Sitting EOB Pants- Performed by patient: Thread/unthread right pants leg, Thread/unthread left pants leg, Pull pants up/down Non-skid slipper socks- Performed by patient: Don/doff right sock, Don/doff left sock Non-skid slipper socks- Performed by helper: Don/doff right sock, Don/doff left sock Shoes - Performed by patient: Don/doff right shoe, Don/doff left shoe Assist for footwear: Supervision/touching assist Assist for lower body dressing: Supervision or verbal cues  Function - Toileting Toileting activity did not occur: No continent bowel/bladder event Toileting steps completed by patient: Adjust clothing prior to toileting, Performs perineal hygiene, Adjust clothing after toileting Toileting Assistive Devices: Grab bar or rail Assist level: Supervision or verbal cues  Function - Air cabin crew transfer activity did not occur: N/A (had BM incontient in bed has catheter) Toilet transfer assistive device: Bedside commode (incontinent loose stools, toileting attempted per  nsg) Assist level to bedside commode (at bedside): Touching or steadying assistance (Pt > 75%) Assist level from bedside commode (at bedside): Moderate assist (Pt  50 - 74%/lift or lower)  Function - Chair/bed transfer Chair/bed transfer method: Ambulatory Chair/bed transfer assist level: Supervision or verbal cues Chair/bed transfer assistive device: Armrests Chair/bed transfer details: Verbal cues for precautions/safety  Function - Locomotion: Wheelchair Will patient use wheelchair at discharge?:  (tbd) Type: Manual Max wheelchair distance: 50 Assist Level: Moderate assistance (Pt 50 - 74%) Assist Level: Moderate assistance (Pt 50 - 74%) Assist Level: Dependent (Pt equals 0%) Turns around,maneuvers to table,bed, and toilet,negotiates 3% grade,maneuvers on rugs and over doorsills: No Function - Locomotion: Ambulation Assistive device: No device Max distance: 150 Assist level: Supervision or verbal cues Assist level: Supervision or verbal cues Assist level: Supervision or verbal cues Walk 150 feet activity did not occur: Safety/medical concerns (fatigue) Assist level: Supervision or verbal cues Walk 10 feet on uneven surfaces activity did not occur: Safety/medical concerns Assist level: Touching or steadying assistance (Pt > 75%)  Function - Comprehension Comprehension: Auditory Comprehension assist level: Understands basic 90% of the time/cues < 10% of the time  Function - Expression Expression: Verbal Expression assist level: Expresses basic 75 - 89% of the time/requires cueing 10 - 24% of the time. Needs helper to occlude trach/needs to repeat words.  Function - Social Interaction Social Interaction assist level: Interacts appropriately 75 - 89% of the time - Needs redirection for appropriate language or to initiate interaction.  Function - Problem Solving Problem solving assist level: Solves basic 75 - 89% of the time/requires cueing 10 - 24% of the time  Function -  Memory Memory assist level: Recognizes or recalls 75 - 89% of the time/requires cueing 10 - 24% of the time Patient normally able to recall (first 3 days only): Current season Medical Problem List and Plan: 1.  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 Formulized individual treatment plan based on medical history, underlying problem and comorbidities. 2.  DVT Prophylaxis/Anticoagulation: Mechanical: Sequential compression devices, below knee Bilateral lower extremities, Right cephalic DVT no need for tx , BLE without evidence of DVT 3. Pain Management: tylenol prn for pain.   4. Mood: LCSW for evaluation and support.   5. Neuropsych: This patient is not capable of making decisions on her own behalf. 6. Skin/Wound Care: routine pressure relief measures 7. Fluids/Electrolytes/Nutrition: Monitor I/O. Check lytes in am. 8. Aspiration PNA:  Continue Unasyn. Check follow up  , afebrile, lung exam normal 9. Dysphagia: Strict aspiration precautions.   10  Tobacco abuse: Question COPD/OSA. Continue oxygen for now and wean as able.   11. Left ICA stent with thrombolysis: continue ASA/plavix and follow up with IVR in 6 weeks- Discussed that pt will need follow up with Dr Estanislado Pandy 12.  Hypokalemia- reviewed labs, recheck K 3.8 13.  Peripheral and pulmonary edema, clinically resolving on po lasix will d/c foley, 9.2 Liters net out, decrease lasix to 9m daily LOS (Days) 7 A FACE TO FACE EVALUATION WAS PERFORMED  KIRSTEINS,ANDREW E 05/20/2015, 8:25 AM

## 2015-05-21 ENCOUNTER — Inpatient Hospital Stay (HOSPITAL_COMMUNITY): Payer: Self-pay | Admitting: Occupational Therapy

## 2015-05-21 LAB — BASIC METABOLIC PANEL
Anion gap: 11 (ref 5–15)
BUN: 14 mg/dL (ref 6–20)
CO2: 22 mmol/L (ref 22–32)
Calcium: 9.3 mg/dL (ref 8.9–10.3)
Chloride: 107 mmol/L (ref 101–111)
Creatinine, Ser: 1.03 mg/dL — ABNORMAL HIGH (ref 0.44–1.00)
GFR calc Af Amer: >60 mL/min (ref >60)
GFR calc non Af Amer: 57 mL/min — ABNORMAL LOW (ref >60)
Glucose, Bld: 100 mg/dL — ABNORMAL HIGH (ref 65–99)
Potassium: 4.5 mmol/L (ref 3.5–5.1)
Sodium: 140 mmol/L (ref 135–145)

## 2015-05-21 MED ORDER — POTASSIUM CHLORIDE CRYS ER 20 MEQ PO TBCR: 20.0000 meq | EXTENDED_RELEASE_TABLET | Freq: Two times a day (BID) | ORAL | Status: DC

## 2015-05-21 MED ORDER — FUROSEMIDE 20 MG PO TABS: 20.0000 mg | ORAL_TABLET | Freq: Two times a day (BID) | ORAL | Status: DC

## 2015-05-21 MED ORDER — CLOPIDOGREL BISULFATE 75 MG PO TABS: 75.0000 mg | ORAL_TABLET | Freq: Every day | ORAL | Status: DC

## 2015-05-21 MED ORDER — PANTOPRAZOLE SODIUM 40 MG PO TBEC: 40.0000 mg | DELAYED_RELEASE_TABLET | Freq: Two times a day (BID) | ORAL | Status: DC

## 2015-05-21 NOTE — Progress Notes (Signed)
Occupational Therapy Session Note  Patient Details  Name: Monica Pugh MRN: [id number] Date of Birth: 1950-09-03  Today's Date: 05/21/2015 OT Individual Time: [id number] OT Individual Time Calculation (min): 25 min    Short Term Goals: Week 1:  OT Short Term Goal 1 (Week 1): Pt will transfer to toilet with steadying A OT Short Term Goal 2 (Week 1): Pt will perform 3/3 toileting tasks with mod A OT Short Term Goal 3 (Week 1): Pt will bath 10/10 parts with min A sit to stand OT Short Term Goal 4 (Week 1): Pt will follow a one step command with only one cue for initation of task OT Short Term Goal 5 (Week 1): Pt will perform LB dressing with mod A  Skilled Therapeutic Interventions/Progress Updates:    Completed ADL retraining at overall distant supervision/setup level.  Pt's husband setup pt with clothes.  Ambulated to bathroom and completed toilet transfer and doffed clothing while seated on toilet.  Completed bathing at sit > stand level in room shower with use of shower seat for safety when washing BLE due to decreased balance strategies in single leg stance.  Pt completed dressing at EOB with setup of items and min cue for sit > stand with LB dressing to increase safety.  Pt ambulated around room to gather items in preparation to d/c home today.  Pt's husband present throughout session reporting prepared to provide supervision and only questions remaining are regarding medications.  Notified PA of pt ready to d/c.  Therapy Documentation Precautions:  Precautions Precautions: Fall Precaution Comments: R hemiparesis Restrictions Weight Bearing Restrictions: No General:   Vital Signs: Therapy Vitals Temp: 98.4 F (36.9 C) Temp Source: Oral Pulse Rate: 75 BP: 130/60 mmHg Patient Position (if appropriate): Lying Oxygen Therapy SpO2: 95 % O2 Device: Not Delivered Pain:  Pt with no c/o pain ADL: ADL ADL Comments: see functional navigator  See Function Navigator for Current  Functional Status.   Therapy/Group: Individual Therapy  HOXIE, [place] 05/21/2015, 9:30 AM

## 2015-05-21 NOTE — Progress Notes (Addendum)
Occupational Therapy Discharge Summary  Patient Details  Name: Monica Pugh MRN: [id number] Date of Birth: [birth year]  Patient has met 13 of 14 long term goals due to improved activity tolerance, improved balance, ability to compensate for deficits, functional use of  RIGHT upper extremity, improved attention and improved awareness.  Patient to discharge at overall Supervision level.  Patient's care partner is independent to provide the necessary cognitive assistance at discharge.  Patient's husband has been present for majority of therapy sessions and reports understanding of recommendations.  Reasons goals not met: N/A  Recommendation:  Patient will benefit from ongoing skilled OT services in home health setting to continue to advance functional skills in the area of BADL, iADL and Reduce care partner burden.  Equipment: shower seat  Reasons for discharge: treatment goals met and discharge from hospital  Patient/family agrees with progress made and goals achieved: Yes  OT Discharge Precautions/Restrictions  Precautions Precautions: Fall General   Vital Signs Therapy Vitals Temp: 98.4 F (36.9 C) Temp Source: Oral Pulse Rate: 75 BP: 130/60 mmHg Patient Position (if appropriate): Lying Oxygen Therapy SpO2: 95 % O2 Device: Not Delivered Pain Pain Assessment Pain Assessment: No/denies pain ADL ADL ADL Comments: see functional navigator Vision/Perception  Vision- History Baseline Vision/History: No visual deficits Vision- Assessment Vision Assessment?: No apparent visual deficits  Cognition Overall Cognitive Status: Impaired/Different from baseline Arousal/Alertness: Awake/alert Orientation Level: Oriented X4 Attention: Selective Selective Attention: Impaired Selective Attention Impairment: Functional basic Memory: Impaired Memory Impairment: Retrieval deficit;Decreased recall of new information Awareness: Impaired Awareness Impairment: Anticipatory  impairment Problem Solving: Impaired Problem Solving Impairment: Functional basic Executive Function: Initiating;Organizing Organizing: Impaired Organizing Impairment: Functional basic Initiating: Impaired Initiating Impairment: Functional basic;Verbal basic Behaviors: Impulsive Safety/Judgment: Impaired Comments: slightly impulsive with movement Sensation Sensation Light Touch: Impaired by gross assessment Proprioception: Impaired by gross assessment Coordination Fine Motor Movements are Fluid and Coordinated: No Finger Nose Finger Test: delayed and with decreased coordination, mild shakiness 9 Hole Peg Test: Lt: 32, Rt: 42 (compared to Rt: 48 on 3/27) Motor  Motor Motor: Within Functional Limits Motor - Discharge Observations: motor [facility] Extremity/Trunk Assessment RUE Assessment RUE Assessment: Within Functional Limits (strength grossly 4/5) LUE Assessment LUE Assessment: Within Functional Limits (strength grossly 5/5)   See Function Navigator for Current Functional Status.  Simonne Come 05/21/2015, 9:35 AM

## 2015-05-21 NOTE — Progress Notes (Signed)
.    Subjective/Complaints: Foley out and voiding continently Pt aphasic/apraxic  and HOH  Objective: Vital Signs: Blood pressure 130/60, pulse 75, temperature 98.4 F (36.9 C), temperature source Oral, resp. rate 18, height '5\' 6"'  (1.676 m), weight 79.606 kg (175 lb 8 oz), SpO2 95 %. No results found. Results for orders placed or performed during the hospital encounter of 05/13/15 (from the past 72 hour(s))  Basic metabolic panel     Status: Abnormal   Collection Time: 05/21/15  4:24 AM  Result Value Ref Range   Sodium 140 135 - 145 mmol/L   Potassium 4.5 3.5 - 5.1 mmol/L   Chloride 107 101 - 111 mmol/L   CO2 22 22 - 32 mmol/L   Glucose, Bld 100 (H) 65 - 99 mg/dL   BUN 14 6 - 20 mg/dL   Creatinine, Ser 1.03 (H) 0.44 - 1.00 mg/dL   Calcium 9.3 8.9 - 10.3 mg/dL   GFR calc non Af Amer 57 (L) >60 mL/min   GFR calc Af Amer >60 >60 mL/min    Comment: (NOTE) The eGFR has been calculated using the CKD EPI equation. This calculation has not been validated in all clinical situations. eGFR's persistently <60 mL/min signify possible Chronic Kidney Disease.    Anion gap 11 5 - 15      General: No acute distress Mood and affect are appropriate Heart: Regular rate and rhythm no rubs murmurs or extra sounds Lungs: Clear to auscultation, breathing unlabored, no rales or wheezes Abdomen: Positive bowel sounds, soft nontender to palpation, nondistended Extremities: No clubbing, cyanosis,1+ edema RUE, trace LUE, trace BLE Skin: No evidence of breakdown, no evidence of rash Neurologic: Cranial nerves II through XII intact, motor strength is 5/5 in bilateral deltoid, bicep, tricep, grip, hip flexor, knee extensors, ankle dorsiflexor and plantar flexor Sensory exam normal sensation to light touch and proprioception in bilateral upper and lower extremities Cerebellar exam normal finger to nose to finger as well as heel to shin in bilateral upper and lower extremities Musculoskeletal: Full  range of motion in all 4 extremities. No joint swelling  Assessment/Plan: 1. Functional deficits secondary to Dysphagia, aphasia, weakness secondary to left frontal SAH and acute infarct left caudate, putamen and small areas of infarct left gurus and insular cortex  Stable for D/C today F/u PCP in 3-4 weeks, hopes to get in with [facility],  F/u PM&R 2 weeks-transition care See D/C summary See D/C instructions FIM: Function - Bathing Bathing activity did not occur: Refused Position: Research scientist (life sciences) parts bathed by patient: Right arm, Chest, Abdomen, Left arm, Front perineal area, Right upper leg, Left upper leg, Buttocks Body parts bathed by helper: Right lower leg, Left lower leg, Buttocks, Back Bathing not applicable: Right lower leg, Left lower leg, Back Assist Level: Supervision or verbal cues  Function- Upper Body Dressing/Undressing What is the patient wearing?: Bra, Pull over shirt/dress Bra - Perfomed by patient: Thread/unthread right bra strap, Thread/unthread left bra strap, Hook/unhook bra (pull down sports bra) Pull over shirt/dress - Perfomed by patient: Thread/unthread right sleeve, Thread/unthread left sleeve, Put head through opening, Pull shirt over trunk Assist Level: Supervision or verbal cues Set up : To obtain clothing/put away Function - Lower Body Dressing/Undressing What is the patient wearing?: Pants, Non-skid slipper socks Position: Sitting EOB Pants- Performed by patient: Thread/unthread right pants leg, Thread/unthread left pants leg, Pull pants up/down Non-skid slipper socks- Performed by patient: Don/doff right sock, Don/doff left sock Non-skid slipper socks- Performed by  helper: Don/doff right sock, Don/doff left sock Shoes - Performed by patient: Don/doff right shoe, Don/doff left shoe Assist for footwear: Supervision/touching assist Assist for lower body dressing: Supervision or verbal cues  Function - Toileting Toileting activity did  not occur: No continent bowel/bladder event Toileting steps completed by patient: Adjust clothing prior to toileting, Performs perineal hygiene, Adjust clothing after toileting Toileting Assistive Devices: Grab bar or rail Assist level: Supervision or verbal cues  Function - Air cabin crew transfer activity did not occur: N/A (had BM incontient in bed has catheter) Toilet transfer assistive device: Bedside commode (incontinent loose stools, toileting attempted per nsg) Assist level to bedside commode (at bedside): Touching or steadying assistance (Pt > 75%) Assist level from bedside commode (at bedside): Moderate assist (Pt 50 - 74%/lift or lower)  Function - Chair/bed transfer Chair/bed transfer method: Ambulatory Chair/bed transfer assist level: No Help, no cues, assistive device, takes more than a reasonable amount of time Chair/bed transfer assistive device: Armrests Chair/bed transfer details: Verbal cues for precautions/safety  Function - Locomotion: Wheelchair Will patient use wheelchair at discharge?: No Type: Manual Max wheelchair distance: 50 Assist Level: Moderate assistance (Pt 50 - 74%) Assist Level: Moderate assistance (Pt 50 - 74%) Assist Level: Dependent (Pt equals 0%) Turns around,maneuvers to table,bed, and toilet,negotiates 3% grade,maneuvers on rugs and over doorsills: No Function - Locomotion: Ambulation Assistive device: No device Max distance: 150 Assist level: Supervision or verbal cues Assist level: Supervision or verbal cues Assist level: Supervision or verbal cues Walk 150 feet activity did not occur: Safety/medical concerns (fatigue) Assist level: Supervision or verbal cues Walk 10 feet on uneven surfaces activity did not occur: Safety/medical concerns Assist level: Supervision or verbal cues  Function - Comprehension Comprehension: Auditory Comprehension assist level: Follows basic conversation/direction with extra time/assistive  device  Function - Expression Expression: Verbal Expression assist level: Expresses basic 75 - 89% of the time/requires cueing 10 - 24% of the time. Needs helper to occlude trach/needs to repeat words.  Function - Social Interaction Social Interaction assist level: Interacts appropriately 75 - 89% of the time - Needs redirection for appropriate language or to initiate interaction.  Function - Problem Solving Problem solving assist level: Solves basic 75 - 89% of the time/requires cueing 10 - 24% of the time  Function - Memory Memory assist level: Recognizes or recalls 75 - 89% of the time/requires cueing 10 - 24% of the time Patient normally able to recall (first 3 days only): Current season Medical Problem List and Plan: 1.  Dysphagia, aphasia, weakness secondary to left frontal SAH and acute infarct left caudate, putamen and small areas of infarct left gurus and insular cortex- D/C home today 2.  DVT Prophylaxis/Anticoagulation: Mechanical: Sequential compression devices, below knee Bilateral lower extremities, Right cephalic DVT no need for tx , BLE without evidence of DVT 3. Pain Management: tylenol prn for pain.   4. Mood: LCSW for evaluation and support.   5. Neuropsych: This patient is not capable of making decisions on her own behalf. 6. Skin/Wound Care: routine pressure relief measures 7. Fluids/Electrolytes/Nutrition: Monitor I/O. Check lytes in am. 8. Aspiration PNA:  resolved off abx 9. Dysphagia: Strict aspiration precautions.   10  Tobacco abuse: Question COPD/OSA. Continue oxygen for now and wean as able.   11. Left ICA stent with thrombolysis: continue ASA/plavix and follow up with IVR in 6 weeks 12.  Hypokalemia- reviewed labs, recheck K 4.5 13.  Peripheral and pulmonary edema, clinically resolving on po lasix will  d/c foley, 9.8 Liters net out, decrease lasix to 92m daily LOS (Days) 8 A FACE TO FACE EVALUATION WAS PERFORMED  KIRSTEINS,ANDREW E 05/21/2015, 7:56 AM

## 2015-05-21 NOTE — Discharge Instructions (Signed)
Inpatient Rehab Discharge Instructions  Monica Pugh Discharge date and time: No discharge date for patient encounter.   Activities/Precautions/ Functional Status: Activity: activity as tolerated Diet: regular diet Wound Care: none needed Functional status:  ___ No restrictions     ___ Walk up steps independently ___ 24/7 supervision/assistance   ___ Walk up steps with assistance ___ Intermittent supervision/assistance  ___ Bathe/dress independently ___ Walk with walker     __x_ Bathe/dress with assistance ___ Walk Independently    ___ Shower independently ___ Walk with assistance    ___ Shower with assistance ___ No alcohol     ___ Return to work/school ________  COMMUNITY REFERRALS UPON DISCHARGE:   Home Health:   ST    RN     Agency:  [facility] Phone:  [phone] Medical Equipment/Items Ordered:  Shower chair with back  GENERAL COMMUNITY RESOURCES FOR PATIENT/FAMILY: Support Groups:  [employer] Stroke Support Group                              Meets the 2nd Thursday of the month at [facility]                              In the dayroom of Inpatient Rehab at [facility] on [address]  [phone]  Special Instructions:    My questions have been answered and I understand these instructions. I will adhere to these goals and the provided educational materials after my discharge from the hospital.  Patient/Caregiver Signature _______________________________ Date __________  Clinician Signature _______________________________________ Date __________  Please bring this form and your medication list with you to all your follow-up doctor's appointments.  STROKE/TIA DISCHARGE INSTRUCTIONS SMOKING Cigarette smoking nearly doubles your risk of having a stroke & is the single most alterable risk factor  If you smoke or have smoked in the last 12 months, you are advised to quit smoking for your health.  Most of the excess cardiovascular risk related to smoking disappears  within a year of stopping.  Ask you doctor about anti-smoking medications  [place] Quit Line: 1-800-QUIT NOW  Free Smoking Cessation Classes [phone]  CHOLESTEROL Know your levels; limit fat & cholesterol in your diet  Lipid Panel     Component Value Date/Time   CHOL 144 05/10/2015 0525   TRIG 88 05/12/2015 0520   HDL 29* 05/10/2015 0525   CHOLHDL 5.0 05/10/2015 0525   VLDL 33 05/10/2015 0525   LDLCALC 82 05/10/2015 0525      Many patients benefit from treatment even if their cholesterol is at goal.  Goal: Total Cholesterol (CHOL) less than 160  Goal:  Triglycerides (TRIG) less than 150  Goal:  HDL greater than 40  Goal:  LDL (LDLCALC) less than 100   BLOOD PRESSURE American Stroke Association blood pressure target is less that 120/80 mm/Hg  Your discharge blood pressure is:  BP: 130/60 mmHg  Monitor your blood pressure  Limit your salt and alcohol intake  Many individuals will require more than one medication for high blood pressure  DIABETES (A1c is a blood sugar average for last 3 months) Goal HGBA1c is under 7% (HBGA1c is blood sugar average for last 3 months)  Diabetes: No known diagnosis of diabetes    Lab Results  Component Value Date   HGBA1C 5.7* 05/10/2015     Your HGBA1c can be lowered with medications,  healthy diet, and exercise.  Check your blood sugar as directed by your physician  Call your physician if you experience unexplained or low blood sugars.  PHYSICAL ACTIVITY/REHABILITATION Goal is 30 minutes at least 4 days per week  Activity: Increase activity slowly, Therapies: Physical Therapy: Home Health Return to work:   Activity decreases your risk of heart attack and stroke and makes your heart stronger.  It helps control your weight and blood pressure; helps you relax and can improve your mood.  Participate in a regular exercise program.  Talk with your doctor about the best form of exercise for you (dancing, walking, swimming, cycling).    DIET/WEIGHT Goal is to maintain a healthy weight  Your discharge diet is: Diet regular Room service appropriate?: Yes; Fluid consistency:: Thin  liquids Your height is:  Height: 5\' 6"  (167.6 cm) Your current weight is: Weight: 79.606 kg (175 lb 8 oz) Your Body Mass Index (BMI) is:  BMI (Calculated): 31  Following the type of diet specifically designed for you will help prevent another stroke.  Your goal weight range is:    Your goal Body Mass Index (BMI) is 19-24.  Healthy food habits can help reduce 3 risk factors for stroke:  High cholesterol, hypertension, and excess weight.  RESOURCES Stroke/Support Group:  Call [phone]   STROKE EDUCATION PROVIDED/REVIEWED AND GIVEN TO PATIENT Stroke warning signs and symptoms How to activate emergency medical system (call 911). Medications prescribed at discharge. Need for follow-up after discharge. Personal risk factors for stroke. Pneumonia vaccine given:  Flu vaccine given:  My questions have been answered, the writing is legible, and I understand these instructions.  I will adhere to these goals & educational materials that have been provided to me after my discharge from the hospital.

## 2015-05-21 NOTE — Progress Notes (Signed)
Social Work Discharge Note  The overall goal for the admission was met for:   Discharge location: Yes - home  Length of Stay: Yes - 8 days  Discharge activity level: Yes - supervision  Home/community participation: Yes  Services provided included: MD, RD, PT, OT, SLP, RN, TR, Pharmacy and SW  Financial Services: Other: pt has applied for Medicaid  Follow-up services arranged: Home Health: ST/RN, DME: shower seat with back and Patient/Family has no preference for HH/DME agencies  Comments (or additional information): Pt to go home with her husband to provide 24/7 supervision.  He has received family education from [facility] therapists and feels prepared to care for pt at home.  Pt pleased to be going home.  Husband's PCP will accept pt - to see the NP, Danae Chen, and CSW will send notes to them.   CSW was not able to find a [facility] agency to accept pt without insurance.  [facility] outpt rehab will see pt either as self pay (50% discount) or pt can apply for charity care.  Pt's husband to f/u with them.  Patient/Family verbalized understanding of follow-up arrangements: Yes  Individual responsible for coordination of the follow-up plan: pt's husband  Confirmed correct DME delivered: Trey Sailors 05/21/2015    Prevatt, Silvestre Mesi

## 2015-05-21 NOTE — Progress Notes (Signed)
Patient discharged to home. Accompanied by family. Discharge instructions given. No questions noted. Monica Pugh

## 2015-05-22 ENCOUNTER — Telehealth: Payer: Self-pay | Admitting: *Deleted

## 2015-05-22 NOTE — Progress Notes (Signed)
Social Work Patient ID: Monica Pugh, female   DOB: 1950/11/26, 65 y.o.   MRN: [id number]   CSW met with pt and her husband to review team conference discussion.  Pt and husband both feel pt is ready to go and husband will come for family education on 05-21-15 and then pt to d/c after that.  CSW was able to arrange outpt ST through [facility].  Information and order sent.  F/U appt scheduled with pt's husband's MD after she was already d/c'd. CSW called pt's husband x 2 to give him this update.  Pt has shower seat with back.  No other needs.

## 2015-05-22 NOTE — Telephone Encounter (Signed)
Monica Pugh from [facility]  rec'd a call from pt's husband and social worker from [facility] requesting orders for outpatient speech therapy.  A tentative schedule has been setup. All they need now are written orders to proceed with speech therapy....please advise

## 2015-05-22 NOTE — Telephone Encounter (Signed)
Rn call Juliann Pulse in human resources and she receive the update FMLA form. Juliann Pulse stated the form is correct with one to two days  Per episode for pts mom. Juliann Pulse stated the form has already been fax. Juliann Pulse also stated the patient was not going to be fired.

## 2015-05-22 NOTE — Telephone Encounter (Signed)
May order SLP OP eval

## 2015-05-22 NOTE — Telephone Encounter (Signed)
RN call Shelly Bombard payroll benefit admin to get clarification about the FMLA form. Juliann Pulse stated the patients son Monica Pugh is cover while his mom was hospital. Juliann Pulse stated the patients son has a husband at home to care for. Juliann Pulse stated on page 7, The Md just needs to put 1 to 2 day per episode, dont put hours. Rn gave [place] fax number to refax page 7 to re do question 7 for the md.

## 2015-05-22 NOTE — Telephone Encounter (Signed)
LFt vm for patients son about the number of hours he needs for FMLA forms.

## 2015-05-22 NOTE — Telephone Encounter (Signed)
Pt's son Chrissie Noa) called said on FMLA a section was filled out that sts he would need 1 hr 2 times/week. He said it takes 45 minutes to get to [facility]. He said this is not enough time, his job is very strict about attendance and he has been told he could be fired over the attendance. He said paperwork was faxed Wednesday 05/20/15 but it was faxed to the number on [place] website , I gave him Katrina's fax number and he will have HR to refax today. Please call him at [phone] when this is received. He was advised the office closes at 12.

## 2015-05-22 NOTE — Patient Care Conference (Signed)
Inpatient RehabilitationTeam Conference and Plan of Care Update Date: 05/20/2015   Time: 11:05 AM    Patient Name: Monica Pugh      Medical Record Number: [id number]  Date of Birth: 02-26-1950 Sex: Female         Room/Bed: 4W19C/4W19C-01 Payor Info: Payor: MEDICAID POTENTIAL / Plan: MEDICAID POTENTIAL / Product Type: *No Product type* /    Admitting Diagnosis: CVA  Admit Date/Time:  05/13/2015  5:45 PM Admission Comments: No comment available   Primary Diagnosis:  <principal problem not specified> Principal Problem: <principal problem not specified>  Patient Active Problem List   Diagnosis Date Noted  . Hemiparesis, aphasia, and dysphagia as late effect of cerebrovascular accident (CVA) ([place])   . Aspiration pneumonia of right middle lobe due to vomit ([place])   . Tobacco abuse   . Edema   . Internal carotid artery stenosis   . Acute ischemic left MCA stroke ([place])   . Cytotoxic brain edema ([place]) 05/11/2015  . Arterial ischemic stroke, MCA (middle cerebral artery), left, acute ([place]) 05/09/2015    Expected Discharge Date: Expected Discharge Date: 05/21/15  Team Members Present: Physician leading conference: Dr. Alysia Penna Social Worker Present: Alfonse Alpers, LCSW Nurse Present: Dorien Chihuahua, RN PT Present: Kem Parkinson, PT OT Present: Simonne Come, OT SLP Present: Windell Moulding, SLP PPS Coordinator present : Daiva Nakayama, RN, CRRN     Current Status/Progress Goal Weekly Team Focus  Medical   fluid overload improving, hypokalemia  maintain proper fluid balance  d/c planning   Bowel/Bladder   Continent of bowel and bladder; LBM 3/28  Mod I  Assess and treat for constipation/diarrhea as needed   Swallow/Nutrition/ Hydration   advanced to regular textures, thin liquids; intermittent supervision, no straws  supervision   trials of thin liquids via straws then d/c swallowing goals.    ADL's   Supervision with self-care tasks  supervision overall  ADL retraining, RUE NMR,  safety awareness   Mobility   mod I bed mobility, S transfers, gait, dynamic balance  S overall  higher level balance, safety awareness, pt/family education in preparation for d/c   Communication   min assist to expand length of utterances beyond word/phrase level   min assist   family education    Safety/Cognition/ Behavioral Observations  slowed processing and initiation, slight impulsivity during mobility  supervision   family education    Pain   Denies pain  < 3  Assess and treat for pain q shift and prn   Skin   Bruising noted to BUE- no breakdown noted  Mod I  Assess skin q shift and prn    Rehab Goals Patient on target to meet rehab goals: Yes Rehab Goals Revised: none *See Care Plan and progress notes for long and short-term goals.  Barriers to Discharge: none     Possible Resolutions to Barriers:  d/c in am    Discharge Planning/Teaching Needs:  Pt to return to her home with her husband/family to provide 24/7 supervision.  Pt's husband has been present for therapies and has received family education and feels comfortable taking pt home.   Team Discussion:  Pt came in with fluid overload, but has had lots of output with lasix and breathing and swelling is better.  Pt is aphasic with cognitive deficits, but is making good progress in ST with structured tasks.  Will need ongoing ST and is still impulsive.  Pt is supervision with all mobility and has good balance.  Pt is  supervision with ADLs.  Pt will need shower chair and ST after d/c.  Revisions to Treatment Plan:  none   Continued Need for Acute Rehabilitation Level of Care: The patient requires daily medical management by a physician with specialized training in physical medicine and rehabilitation for the following conditions: Daily direction of a multidisciplinary physical rehabilitation program to ensure safe treatment while eliciting the highest outcome that is of practical value to the patient.: Yes Daily medical  management of patient stability for increased activity during participation in an intensive rehabilitation regime.: Yes Daily analysis of laboratory values and/or radiology reports with any subsequent need for medication adjustment of medical intervention for : Neurological problems  Prevatt, Silvestre Mesi 05/22/2015, 10:14 AM

## 2015-05-22 NOTE — Telephone Encounter (Signed)
Monica Pugh inpatient rehab called and said they would fax over the necessary documents to get pt's speech therapy started

## 2015-05-22 NOTE — Telephone Encounter (Signed)
Rn call patients husband and he stated his wife was home now.

## 2015-05-25 NOTE — Discharge Summary (Signed)
Physician Discharge Summary  Patient ID: Tralana Gettelfinger MRN: [id number] DOB/AGE: 63-Jul-1952 63 y.o.  Admit date: 05/13/2015 Discharge date: 05/21/2015  Discharge Diagnoses:  Principal Problem:   Acute ischemic left MCA stroke [facility]) Active Problems:   Hemiparesis, aphasia, and dysphagia as late effect of cerebrovascular accident (CVA) ([place])   Aspiration pneumonia of right middle lobe due to vomit ([place])   Tobacco abuse   Edema   Internal carotid artery stenosis   Discharged Condition: Stable.   Significant Diagnostic Studies: Dg Chest 2 View  05/15/2015  CLINICAL DATA:  Short of breath EXAM: CHEST  2 VIEW COMPARISON:  Yesterday FINDINGS: Vascular congestion is improved. Edema has improved. Small pleural effusions and bibasilar opacities are stable. No pneumothorax. IMPRESSION: Improved edema and vascular congestion. Stable basilar opacities and small pleural effusions. Electronically Signed   By: Marybelle Killings M.D.   On: 05/15/2015 09:40    Dg Chest 2 View  05/14/2015  CLINICAL DATA:  Short of breath EXAM: CHEST  2 VIEW COMPARISON:  Yesterday FINDINGS: Diffuse pulmonary edema is worse. Low volumes. Normal heart size. No pneumothorax. Small pleural effusions. IMPRESSION: Worsening volume overload. Electronically Signed   By: Marybelle Killings M.D.   On: 05/14/2015 07:52    Labs:  Basic Metabolic Panel:  Recent Labs Lab 05/21/15 0424  NA 140  K 4.5  CL 107  CO2 22  GLUCOSE 100*  BUN 14  CREATININE 1.03*  CALCIUM 9.3    CBC: No results for input(s): WBC, NEUTROABS, HGB, HCT, MCV, PLT in the last 168 hours.  CBG: No results for input(s): GLUCAP in the last 168 hours.  Brief HPI:   Monica Pugh is a 65 y.o. female in relatively good health who was admitted on 05/09/15 with left facial droop, difficulty handling secretions, difficulty speaking and unresponsiveness. History taken from chart review. CT head negative and she was transferred to [facility] for treatment. She was treated with IV tPA  and intubated for airway protection. 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 Right ICA remained occluded and left ICA now patent. 2D echo with EF 55-60% with grade 1 diastolic dysfunction and mild LAE. She had difficulty with vent wean with vomiting and possible RML aspiration PNA. She was started on Unasyn for treatment and tolerated extubation on 03/20.  Patient on ASA/Plavix for secondary stroke prevention. 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 swallow evaluation done and she was placed on dysphagia 2, thin liquids due to neurogenic dysphagia. Therapy evaluations done and patient limited by right hemiparesis, apraxia, question of visual deficits and difficulty following commands. 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 CIR recommended for follow up therapy.   Hospital Course: [name] was admitted to rehab 05/13/2015 for inpatient therapies to consist of PT, ST and OT at least three hours five days a week. Past admission physiatrist, therapy team and rehab RN have worked together to provide customized collaborative inpatient rehab. She had issues with SOB and dyspnea evening past admission and was started on IV antibiotics due to concern of aspiration PNA. Follow up CXR next am showed evidence of fluid overload and she was treated  with diuretics with improvement in respiratory status as well as resolution of peripheral edema. Antibiotics were discontinued and she was weaned off oxygen.  Renal status has been monitored with routine check of lytes and mild  hypokalemia has resolved with supplementation. BLE dopplers were negative for DVT.  Dopplers of RUE showed superficial thrombosis of right cephalic vein and no treatment needed.  Foley was discontinued and she is voiding without difficulty. Her blood pressures have been controlled on low dose lasix. Po intake has been good and she has made steady progress with improvement in right hemiparesis.    Rehab course: During patient's stay in rehab weekly team conferences were held to monitor patient's progress, set goals and discuss barriers to discharge. At admission, patient required min assist with mobility and moderate assist with basic self care tasks. She had basic expressive and receptive language skills with verbal output at single word level. Voice was hoarse with decreased volume due to recent intubation. She has had improvement in activity tolerance, balance, postural control, as well as ability to compensate for deficits. She is has had improvement in functional use RUE  and RLE as well as improvement in awareness. She is able to complete ADL tasks with supervision. She is able to ambulate 500' without AD and with supervision due to poor safety awareness. She is tolerating regular diet without s/s of aspiration. She requires min assist for expansion of utterances to the phrase and sentence level and is able to follow multi-step commands with supervision cues and extra time. Family education was done with husband regarding use of compensatory strategies as well as need for supervision for safety. She will continue to receive follow up outpatient speech therapy at [facility] after discharge.     Disposition: 01-Home or Self Care  Diet: Regular.   Special Instructions: 1. No driving or strenuous activity till cleared by MD.        Discharge Instructions    Ambulatory referral to Physical Medicine Rehab    Complete by:  As directed   Moderate complexity follow up 1-2 weeks left [facility]             Medication List    STOP taking these medications        Ampicillin-Sulbactam 3 g in sodium chloride 0.9 % 100 mL     ipratropium-albuterol 0.5-2.5 (3) MG/3ML Soln  Commonly known as:  DUONEB      TAKE these medications        acetaminophen 325 MG tablet  Commonly known as:  TYLENOL  Take 650 mg by mouth every 6 (six) hours as needed.     aspirin 325 MG tablet  Take 1 tablet (325 mg total) by mouth daily.     clopidogrel 75 MG tablet  Commonly known as:  PLAVIX  Take 1 tablet (75 mg total) by mouth daily with breakfast.     furosemide 20 MG tablet  Commonly known as:  LASIX  Take 1 tablet (20 mg total) by mouth 2 (two) times daily.     pantoprazole 40 MG tablet  Commonly known as:  PROTONIX  Take 1 tablet (40 mg total) by mouth 2 (two) times daily.     potassium chloride SA 20 MEQ tablet  Commonly known as:  K-DUR,KLOR-CON  Take 1 tablet (20 mEq total) by mouth 2 (two) times daily.       Follow-up Information    Follow up with HAGUE, Rosalyn Charters, MD.   Specialty:  Internal Medicine  Why:  Social worker is awaiting return call from [facility] and will let you know when we hear back from them.   Contact information:   [address] [place] [address] [phone]       Follow up with Antony Contras, MD.   Specialties:  Neurology, Radiology   Why:  call for appointment one month   Contact information:   [place] [address] [phone]       Follow up with Charlett Blake, MD.   Specialty:  Physical Medicine and Rehabilitation   Why:  office to call for appointment   Contact information:   [place] [address] [phone]       Signed: Bary Leriche 05/27/2015, 6:03 PM

## 2015-06-04 ENCOUNTER — Other Ambulatory Visit: Payer: Self-pay | Admitting: Neurology

## 2015-06-04 DIAGNOSIS — I63039 Cerebral infarction due to thrombosis of unspecified carotid artery: Secondary | ICD-10-CM

## 2015-06-15 ENCOUNTER — Inpatient Hospital Stay: Payer: Self-pay | Admitting: Physical Medicine & Rehabilitation

## 2015-07-03 ENCOUNTER — Inpatient Hospital Stay: Payer: Self-pay | Admitting: Physical Medicine & Rehabilitation

## 2015-07-13 ENCOUNTER — Telehealth: Payer: Self-pay

## 2015-07-13 ENCOUNTER — Ambulatory Visit: Payer: MEDICAID | Admitting: Neurology

## 2015-07-13 NOTE — Telephone Encounter (Addendum)
PT WAS A NO SHOW AT [facility].

## 2015-07-29 ENCOUNTER — Telehealth: Payer: Self-pay

## 2015-07-29 NOTE — Telephone Encounter (Signed)
Patients son had Dr.Sethi fill out FMLA papers while she was hospitalized. Patient was a no show at her appt. No more FMLA papers will be revise or done until patient reschedules appt.

## 2015-08-21 ENCOUNTER — Other Ambulatory Visit: Payer: Self-pay

## 2015-08-21 NOTE — Patient Outreach (Signed)
Callback confirmed; mRS completed.

## 2015-08-21 NOTE — Patient Outreach (Signed)
Outreach attempt made to confirm patient's mRS score. Left voicemail.

## 2017-03-23 IMAGING — MR MR HEAD WO/W CM
10 of 13 series · 34 of 48 positions shown · IV contrast (multihance)
Comparison: CT head 05/09/2015

CLINICAL DATA: Acute ischemic stroke post revascularization.

EXAM:
MRI HEAD WITHOUT AND WITH CONTRAST
TECHNIQUE: Multiplanar, multiecho pulse sequences of the brain and surrounding
structures were obtained without and with intravenous contrast.
CONTRAST:  18mL MULTIHANCE GADOBENATE DIMEGLUMINE 529 MG/ML IV SOLN

[Series 4: DWI · axial · 3.0mm · 1.09mm/px · z∈[-71,+82]mm · 9 of 104 slices shown (1 of 4)]
[im 1/104]
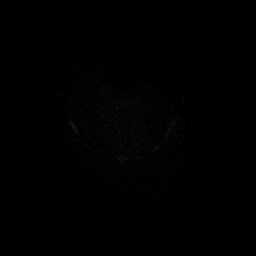
[im 13/104]
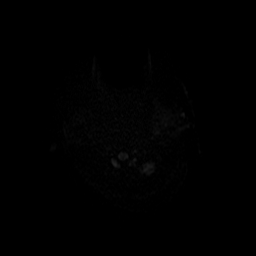
[im 26/104]
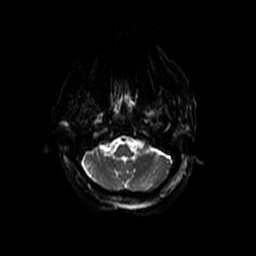
[im 39/104]
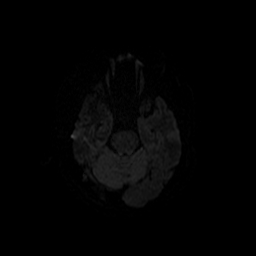
[im 52/104]
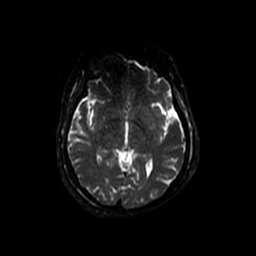
[im 65/104]
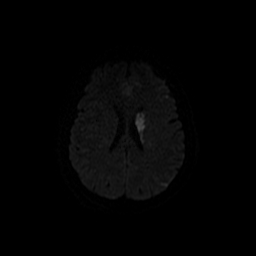
[im 78/104]
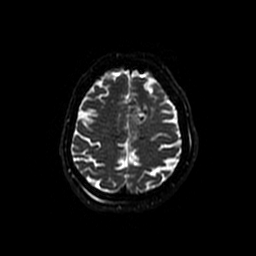
[im 91/104]
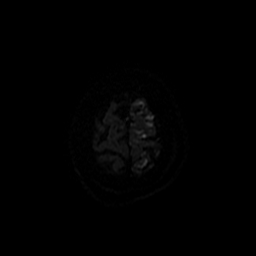
[im 104/104]
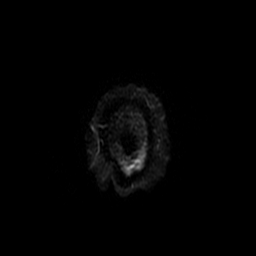

[Series 5: DWI · coronal · 5.0mm · 1.09mm/px · 6 of 74 slices shown (2 of 4)]
[im 1/74]
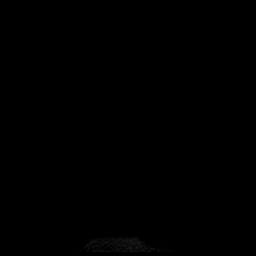
[im 15/74]
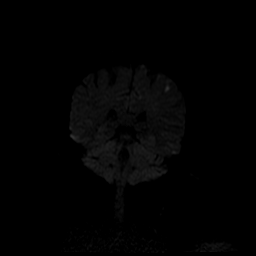
[im 30/74]
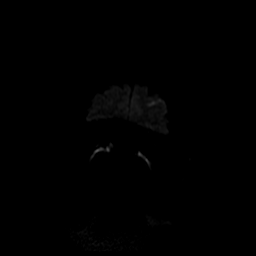
[im 44/74]
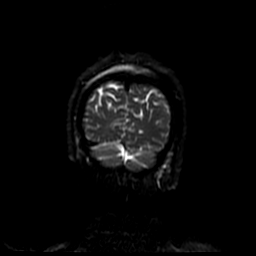
[im 59/74]
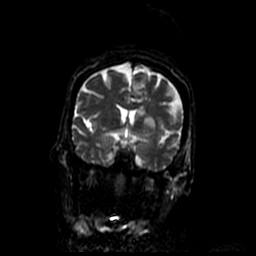
[im 74/74]
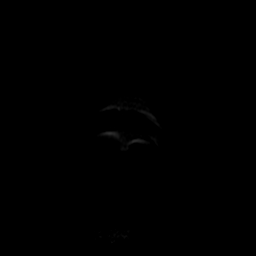

[Series 6: FLAIR · axial · 5.0mm · 0.43mm/px · z∈[-69,+80]mm · 2 of 26 slices shown]
[im 1/26]
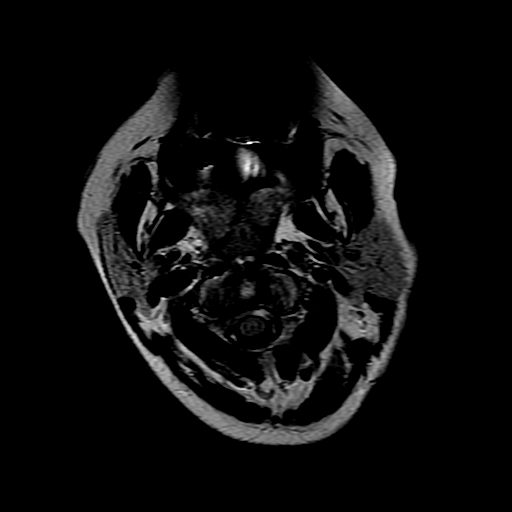
[im 26/26]
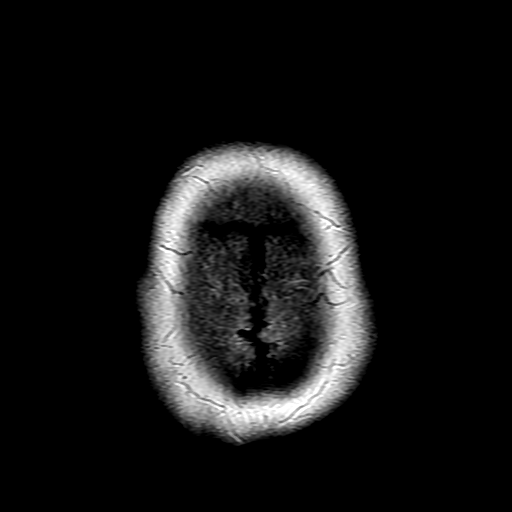

[Series 7: T1 · sagittal · 5.0mm · 0.47mm/px · 2 of 26 slices shown]
[im 1/26]
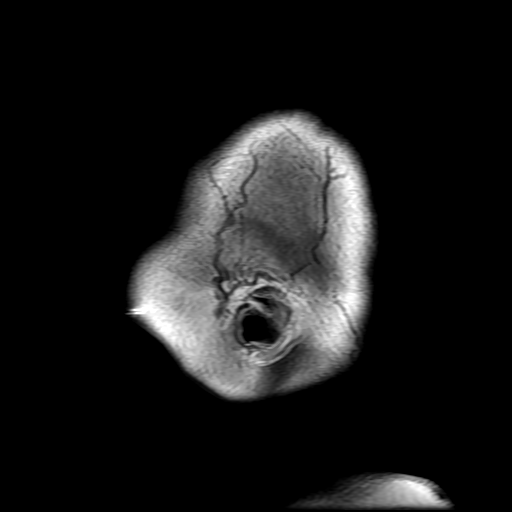
[im 26/26]
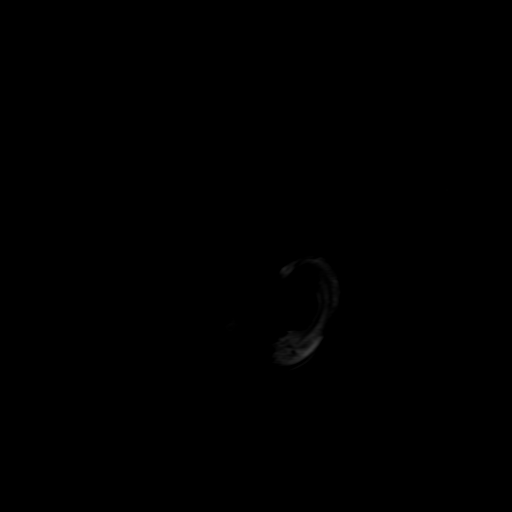

[Series 8: T2 · axial · 5.0mm · 0.43mm/px · z∈[-69,+80]mm · 2 of 26 slices shown]
[im 1/26]
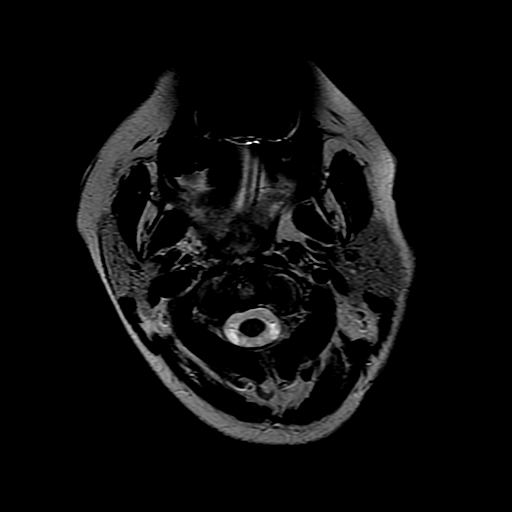
[im 26/26]
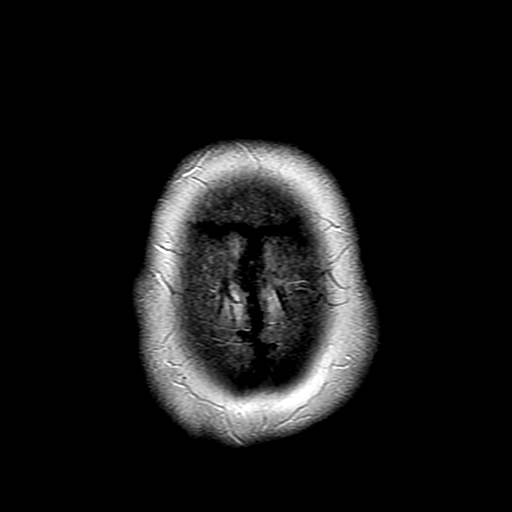

[Series 11: T2 post-contrast · coronal · 5.0mm · 0.39mm/px · 2 of 28 slices shown]
[im 1/28]
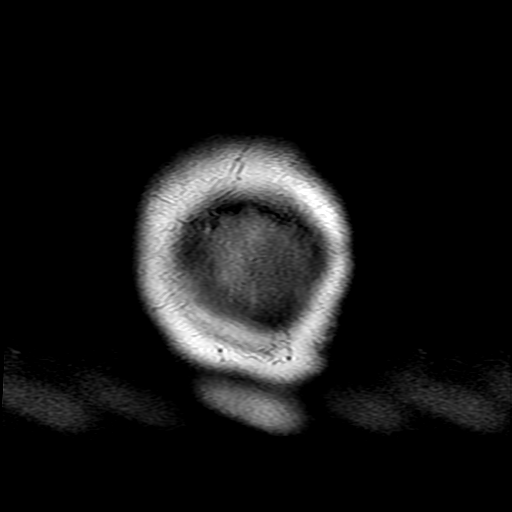
[im 28/28]
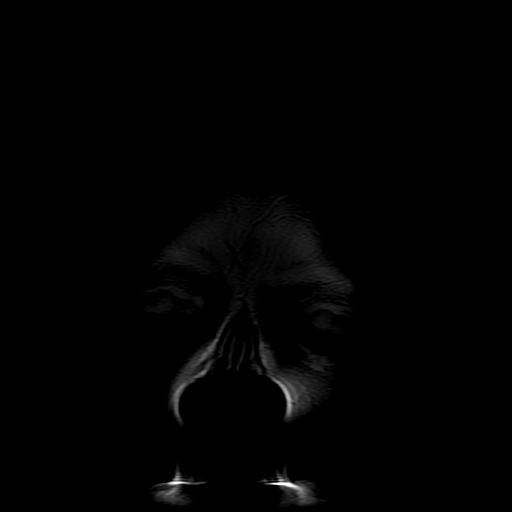

[Series 12: T1 post-contrast · sagittal · 5.0mm · 0.47mm/px · 2 of 26 slices shown (1 of 2)]
[im 1/26]
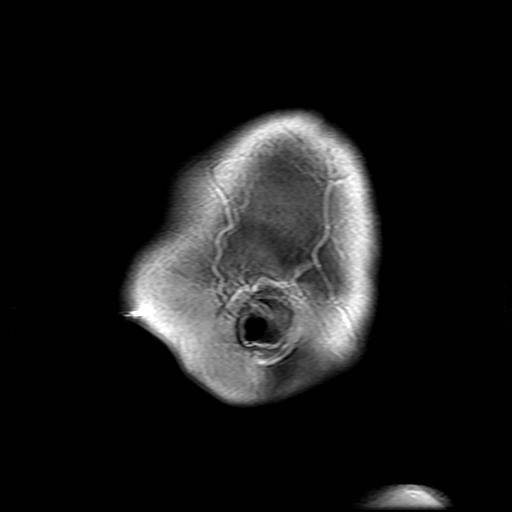
[im 26/26]
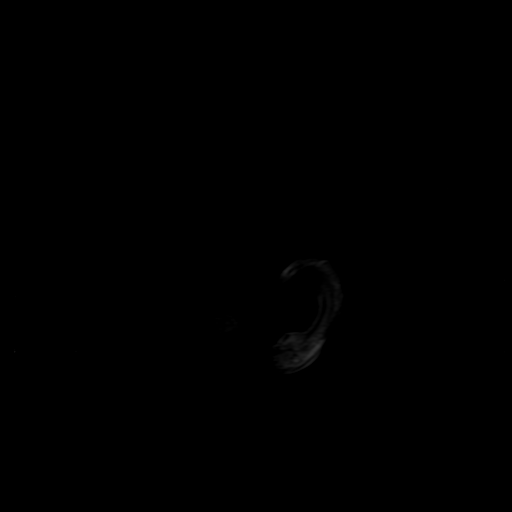

[Series 14: T1 post-contrast · coronal · 5.0mm · 0.39mm/px · 2 of 28 slices shown (2 of 2)]
[im 1/28]
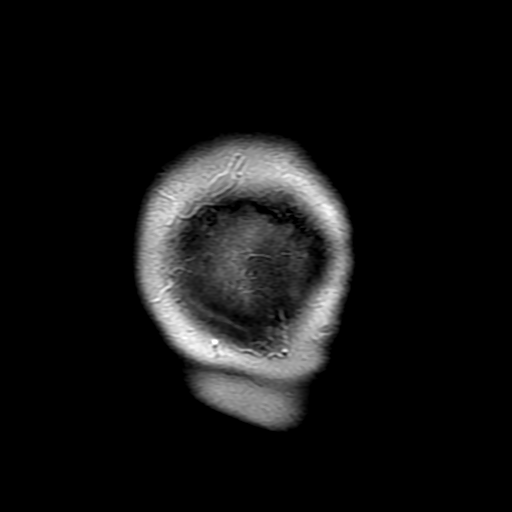
[im 28/28]
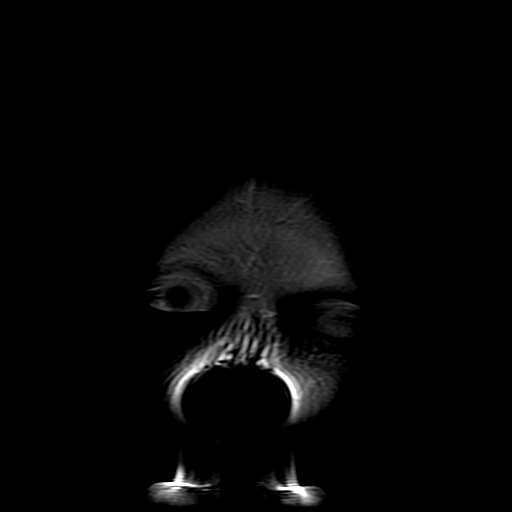

[Series 400: DWI · axial · 3.0mm · 1.09mm/px · z∈[-71,+82]mm · 4 of 52 slices shown (3 of 4)]
[im 1/52]
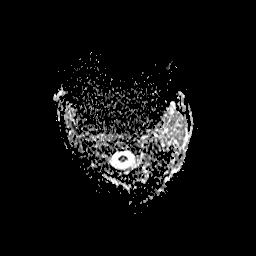
[im 18/52]
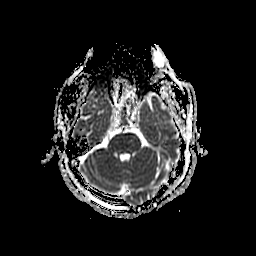
[im 35/52]
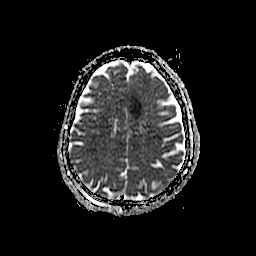
[im 52/52]
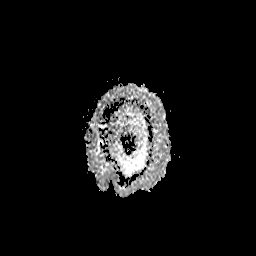

[Series 500: DWI · coronal · 5.0mm · 1.09mm/px · 3 of 37 slices shown (4 of 4)]
[im 1/37]
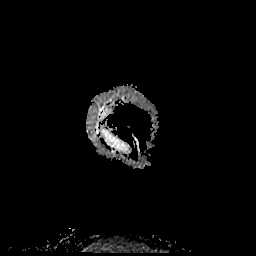
[im 19/37]
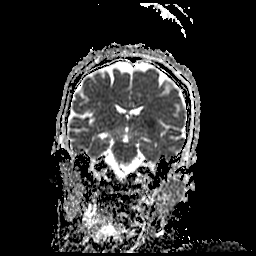
[im 37/37]
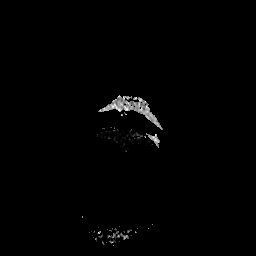

[34 of 48 positions shown; findings below may reference images not displayed]

FINDINGS: Subarachnoid hemorrhage over the left convexity as noted on CT. Left
frontal parenchymal hematoma measuring 30 x 17 mm in the anterior
cerebral artery territory. This also is noted on CT and similar in
size.

Acute infarct involving the left caudate and left putamen. Very
small areas of acute infarct involving the insular cortex on the
left as well as the left frontal and parietal lobes. 5 mm acute
infarct right frontal cortex over the convexity. Brainstem and
cerebellum intact. Small area of acute infarct in the gyrus rectus
on the left.

Ventricle size normal. No shift of the midline structures. No mass
lesion.

Occluded right internal carotid artery is unchanged. The left
internal carotid artery now patent with normal flow void. Both
vertebral arteries and the basilar are patent.

Postcontrast imaging reveals no enhancing mass lesion. Mild vascular
enhancement in the region of the left frontal hematoma which is
likely a hemorrhagic infarction. Dural sinuses are patent.
IMPRESSION: Subarachnoid hemorrhage on the left. Left frontal hematoma likely
related to hemorrhagic infarction.

Acute infarct in the left caudate and putamen. Scattered small areas
of acute infarct involving the left gyrus rectus and insular cortex.
Scattered small areas of acute infarct in the left frontal parietal
cortex and in the right frontal cortex.

The right internal carotid artery remains occluded. Left internal
carotid artery now appears patent through the cavernous segment.

## 2017-03-23 IMAGING — CR DG CHEST 1V PORT
1 series · 1 of 1 positions shown · non-contrast
Comparison: Chest radiograph from one day prior.

CLINICAL DATA: Acute respiratory failure

EXAM:
PORTABLE CHEST 1 VIEW

[AP]
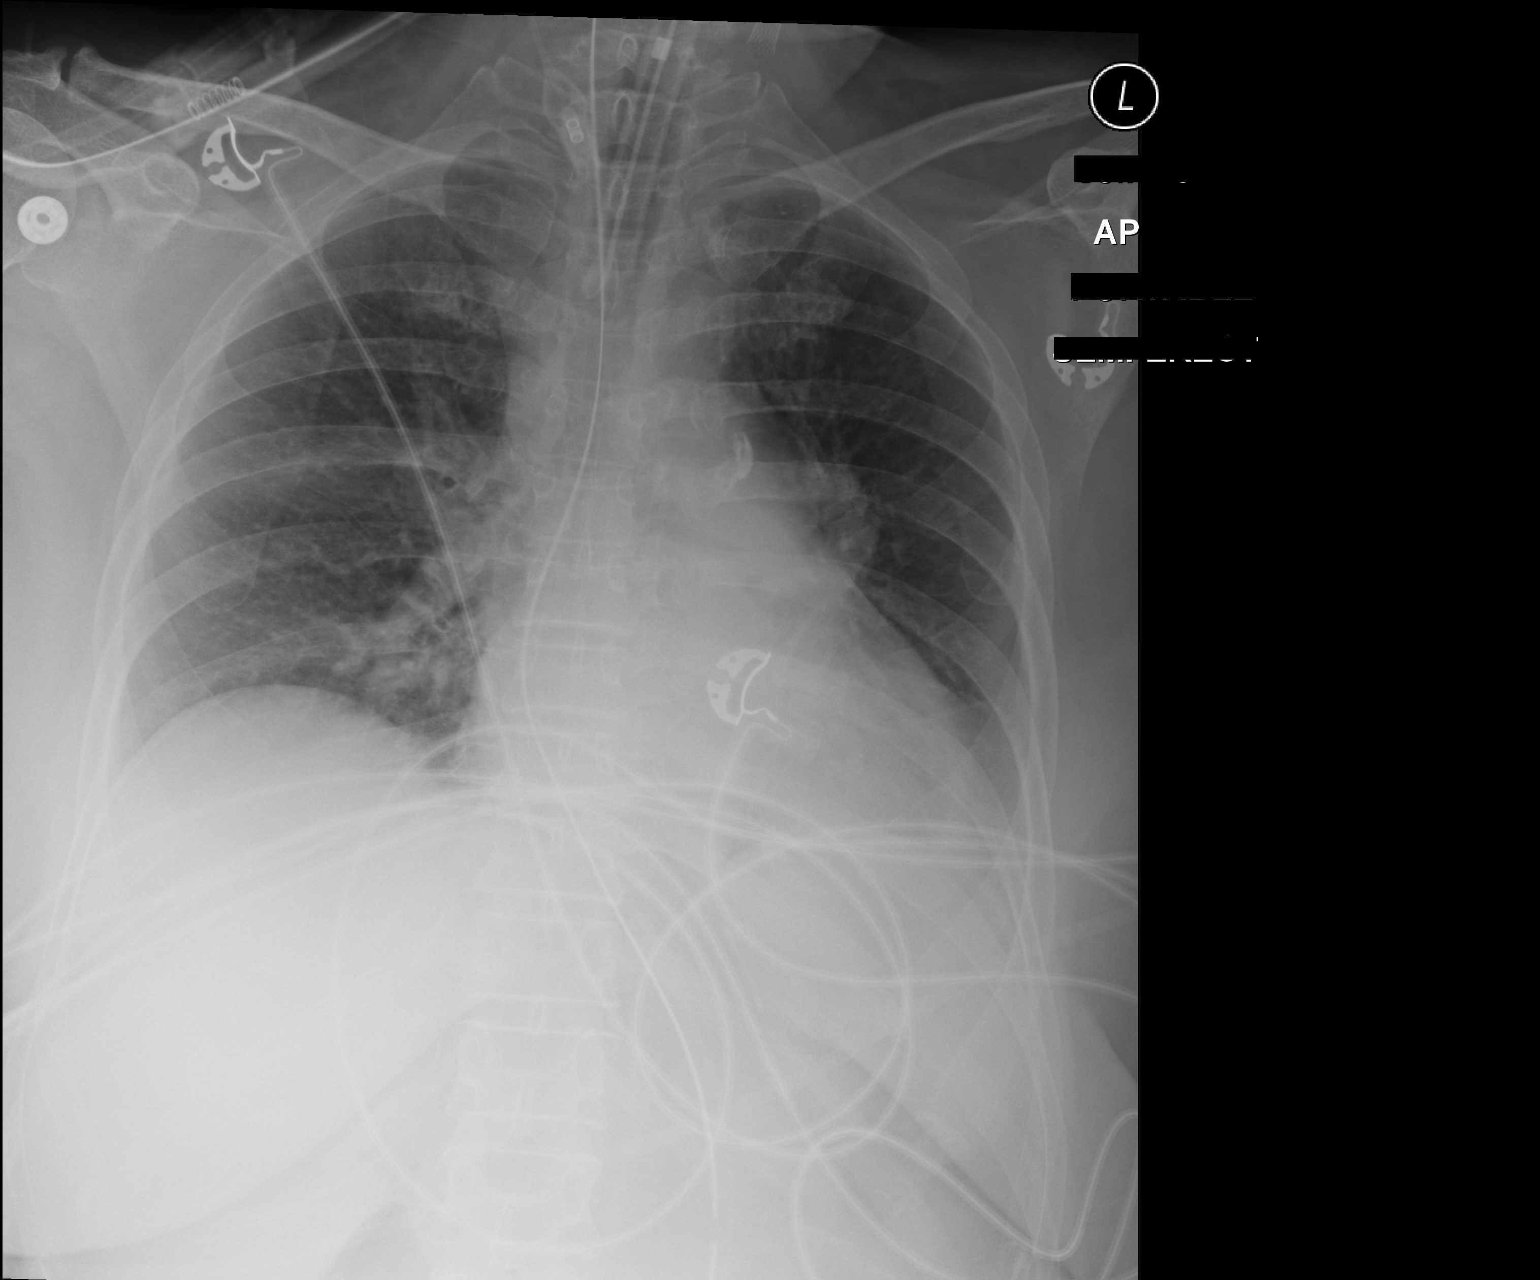

[1 of 1 positions shown; findings below may reference images not displayed]

FINDINGS: Endotracheal tube tip is 5 cm above the carina. Enteric tube enters
stomach with the tip not seen on this image. Stable
cardiomediastinal silhouette with top-normal heart size. No
pneumothorax. Possible trace left pleural effusion. Decreased right
mid lung opacity. Stable left lung base opacity. No overt pulmonary
edema.
IMPRESSION: 1. Endotracheal tube tip is 5 cm above the carina.
2. Possible trace left pleural effusion.
3. Decreased right mid lung opacity, likely decreased atelectasis.
4. Stable left lung base opacity, either atelectasis or pneumonia.

## 2019-03-27 ENCOUNTER — Other Ambulatory Visit: Payer: Self-pay

## 2019-03-27 ENCOUNTER — Telehealth (HOSPITAL_COMMUNITY): Payer: Self-pay

## 2019-03-27 DIAGNOSIS — I6529 Occlusion and stenosis of unspecified carotid artery: Secondary | ICD-10-CM

## 2019-03-27 NOTE — Telephone Encounter (Signed)

## 2019-03-28 ENCOUNTER — Encounter: Payer: Self-pay | Admitting: Vascular Surgery

## 2019-03-28 ENCOUNTER — Ambulatory Visit (HOSPITAL_COMMUNITY)
Admission: RE | Admit: 2019-03-28 | Discharge: 2019-03-28 | Disposition: A | Discharge status: Home / Self Care | Payer: Medicare HMO | Source: Ambulatory Visit | Attending: Vascular Surgery | Admitting: Vascular Surgery

## 2019-03-28 ENCOUNTER — Other Ambulatory Visit: Payer: Self-pay

## 2019-03-28 ENCOUNTER — Ambulatory Visit (INDEPENDENT_AMBULATORY_CARE_PROVIDER_SITE_OTHER): Payer: Self-pay | Admitting: Vascular Surgery

## 2019-03-28 VITALS — BP 144/72 | HR 75 | Temp 97.4°F | Resp 20 | Ht 66.0 in | Wt 175.0 lb

## 2019-03-28 DIAGNOSIS — I6529 Occlusion and stenosis of unspecified carotid artery: Secondary | ICD-10-CM | POA: Insufficient documentation

## 2019-03-28 DIAGNOSIS — I6523 Occlusion and stenosis of bilateral carotid arteries: Secondary | ICD-10-CM

## 2019-03-28 NOTE — Progress Notes (Signed)
Referring Physician: Dr Jannette Fogo  Patient name: Monica Pugh MRN: [id number] DOB: 10/19/1950 Sex: female  REASON FOR CONSULT: Asymptomatic greater than 80% recurrent left internal carotid artery stenosis  HPI: Monica Pugh is a 69 y.o. female, who was recently found to have a left carotid bruit.  She was sent for a carotid duplex exam which showed chronic right internal carotid artery occlusion and recurrent stenosis of a left internal carotid artery stent.  She has no symptoms of TIA amaurosis or stroke recently.  She did have a stroke in 2017 and had a clot retrieval and left internal carotid artery stent placement by interventional radiology.  She states she recovered about 80% of function after the stroke.  Her primary symptom at this point is still some cramping sensation that occurs in her right foot.  She does not describe claudication.  She currently is on aspirin Plavix and a statin.  She is currently smoking about 1 pack of cigarettes per day.  Greater than 3 minutes today spent regarding smoking cessation counseling.  Other medical problems include hyperlipidemia, hearing loss, sleep apnea all of which are currently stable.  Past Medical History:  Diagnosis Date  . Anemia   . Carotid artery occlusion   . GERD (gastroesophageal reflux disease)   . Hearing loss   . Hyperlipidemia   . Peripheral vascular disease ([place])   . Sleep apnea   . Stroke ([place])   . Vitamin B 12 deficiency   . Vitamin D deficiency    Past Surgical History:  Procedure Laterality Date  . RADIOLOGY WITH ANESTHESIA N/A 05/09/2015   Procedure: RADIOLOGY WITH ANESTHESIA;  Surgeon: Luanne Bras, MD;  Location: [facility];  Service: Radiology;  Laterality: N/A;  . TUBAL LIGATION      Family History  Problem Relation Age of Onset  . Cancer Mother   . Heart attack Father     SOCIAL HISTORY: Social History   Socioeconomic History  . Marital status: Married    Spouse name: Not on file  . Number of children: Not  on file  . Years of education: Not on file  . Highest education level: Not on file  Occupational History  . Not on file  Tobacco Use  . Smoking status: Current Every Day Smoker    Packs/day: 1.50    Years: 40.00    Pack years: 60.00    Types: Cigarettes  . Smokeless tobacco: Never Used  Substance and Sexual Activity  . Alcohol use: Not Currently    Alcohol/week: 0.0 standard drinks  . Drug use: Never  . Sexual activity: Not on file  Other Topics Concern  . Not on file  Social History Narrative  . Not on file   Social Determinants of Health   Financial Resource Strain:   . Difficulty of Paying Living Expenses: Not on file  Food Insecurity:   . Worried About Charity fundraiser in the Last Year: Not on file  . Ran Out of Food in the Last Year: Not on file  Transportation Needs:   . Lack of Transportation (Medical): Not on file  . Lack of Transportation (Non-Medical): Not on file  Physical Activity:   . Days of Exercise per Week: Not on file  . Minutes of Exercise per Session: Not on file  Stress:   . Feeling of Stress : Not on file  Social Connections:   . Frequency of Communication with Friends and Family: Not on file  . Frequency of Social  Gatherings with Friends and Family: Not on file  . Attends Religious Services: Not on file  . Active Member of Clubs or Organizations: Not on file  . Attends Archivist Meetings: Not on file  . Marital Status: Not on file  Intimate Partner Violence:   . Fear of Current or Ex-Partner: Not on file  . Emotionally Abused: Not on file  . Physically Abused: Not on file  . Sexually Abused: Not on file    No Known Allergies  Current Outpatient Medications  Medication Sig Dispense Refill  . acetaminophen (TYLENOL) 325 MG tablet Take 650 mg by mouth every 6 (six) hours as needed.    Marland Kitchen aspirin 81 MG EC tablet Take by mouth.    Marland Kitchen atorvastatin (LIPITOR) 40 MG tablet Take by mouth.    . clopidogrel (PLAVIX) 75 MG tablet Take 1  tablet (75 mg total) by mouth daily with breakfast. 30 tablet 1  . pantoprazole (PROTONIX) 40 MG tablet Take 1 tablet (40 mg total) by mouth 2 (two) times daily. 60 tablet 2   No current facility-administered medications for this visit.    ROS:   General:  No weight loss, Fever, chills  HEENT: No recent headaches, no nasal bleeding, no visual changes, no sore throat  Neurologic: No dizziness, blackouts, seizures. No recent symptoms of stroke or mini- stroke. No recent episodes of slurred speech, or temporary blindness.  Cardiac: No recent episodes of chest pain/pressure, no shortness of breath at rest.  + shortness of breath with exertion.  Denies history of atrial fibrillation or irregular heartbeat  Vascular: No history of rest pain in feet.  No history of claudication.  No history of non-healing ulcer, No history of DVT   Pulmonary: No home oxygen, no productive cough, no hemoptysis,  No asthma or wheezing  Musculoskeletal:  [ ]  Arthritis, [ ]  Low back pain,  [ ]  Joint pain  Hematologic:No history of hypercoagulable state.  No history of easy bleeding.  No history of anemia  Gastrointestinal: No hematochezia or melena,  No gastroesophageal reflux, no trouble swallowing  Urinary: [ ]  chronic Kidney disease, [ ]  on HD - [ ]  MWF or [ ]  TTHS, [ ]  Burning with urination, [ ]  Frequent urination, [ ]  Difficulty urinating;   Skin: No rashes  Psychological: No history of anxiety,  No history of depression   Physical Examination  Vitals:   03/28/19 1217 03/28/19 1219  BP: (!) 141/69 (!) 144/72  Pulse: 75   Resp: 20   Temp: (!) 97.4 F (36.3 C)   SpO2: 95%   Weight: 175 lb (79.4 kg)   Height: 5\' 6"  (1.676 m)     Body mass index is 28.25 kg/m.  General:  Alert and oriented, no acute distress HEENT: Normal Neck: No JVD Cardiac: Regular Rate and Rhythm Abdomen: Soft, non-tender, non-distended, no mass, obese Skin: No rash Extremity Pulses:  2+ radial, brachial, femoral,  1+ right absent left dorsalis pedis, absent posterior tibial pulses bilaterally Musculoskeletal: No deformity or edema  Neurologic: Upper and lower extremity motor 5/5 and symmetric, no obvious facial asymmetry  DATA:  Patient had a previous carotid duplex exam at [facility] internal medicine which showed chronic occlusion right internal carotid artery greater than 80% left internal carotid artery stenosis.  We repeated her carotid duplex exam in our office today which confirmed the previous findings.  Lesion was moderately calcified.  There may have been some compensatory flow secondary to the right internal carotid artery  occlusion.  ASSESSMENT: Recurrent stenosis status post left internal carotid artery stenting currently asymptomatic   PLAN: We will obtain a CT angio of the head and neck to more precisely define how much stenosis the left internal carotid artery has compared to previous.  The patient will continue her aspirin statin and Plavix.  She will follow-up with me in a few weeks after her CTA.   Ruta Hinds, MD [facility] Office: [phone] Pager: [phone]

## 2019-04-08 ENCOUNTER — Other Ambulatory Visit: Payer: Medicare HMO

## 2019-04-08 ENCOUNTER — Ambulatory Visit
Admission: RE | Admit: 2019-04-08 | Discharge: 2019-04-08 | Disposition: A | Discharge status: Home / Self Care | Payer: Medicare HMO | Source: Ambulatory Visit | Attending: Vascular Surgery | Admitting: Vascular Surgery

## 2019-04-08 DIAGNOSIS — I6523 Occlusion and stenosis of bilateral carotid arteries: Secondary | ICD-10-CM

## 2019-04-08 DIAGNOSIS — I6529 Occlusion and stenosis of unspecified carotid artery: Secondary | ICD-10-CM

## 2019-04-08 MED ORDER — IOPAMIDOL (ISOVUE-370) INJECTION 76%
75.0000 mL | Freq: Once | INTRAVENOUS | Status: AC | PRN
  Administered 2019-04-08: 75 mL via INTRAVENOUS

## 2019-04-10 ENCOUNTER — Telehealth (HOSPITAL_COMMUNITY): Payer: Self-pay

## 2019-04-10 NOTE — Telephone Encounter (Signed)

## 2019-04-11 ENCOUNTER — Ambulatory Visit: Payer: Medicare HMO | Admitting: Vascular Surgery

## 2019-04-11 ENCOUNTER — Other Ambulatory Visit: Payer: Self-pay

## 2019-04-11 ENCOUNTER — Ambulatory Visit (INDEPENDENT_AMBULATORY_CARE_PROVIDER_SITE_OTHER): Payer: Medicare HMO | Admitting: Vascular Surgery

## 2019-04-11 DIAGNOSIS — I6523 Occlusion and stenosis of bilateral carotid arteries: Secondary | ICD-10-CM | POA: Diagnosis not present

## 2019-04-11 NOTE — Progress Notes (Signed)
    Virtual Visit via Telephone Note  I connected with Monica Pugh on 04/11/2019 using the [email or web address] by telephone and verified that I was speaking with the correct person using two identifiers. Patient was located at home and accompanied by her husband who took most of the call since she has a hearing deficit. I am located at home.   The limitations of evaluation and management by telemedicine and the availability of in person appointments have been previously discussed with the patient and are documented in the patients chart. The patient expressed understanding and consented to proceed.  PCP: Bonnita Nasuti, MD   History of Present Illness: Monica Pugh is a 69 y.o. female with Pt is a 69 yo female last seen on 03/28/19 for possible recurrent left ICA stenosis seen on duplex exam for evaluation of a left side bruit.  She had a left ICA stent placed by Dr Estanislado Pandy for a stroke 2017.  She remains on Plavix and Lipitor.  She has not had any stroke symptoms.  She has follow up today after CT angio of the neck.  She continues to have no symptoms of TIA amaurosis or stroke.  Past Medical History:  Diagnosis Date  . Anemia   . Carotid artery occlusion   . GERD (gastroesophageal reflux disease)   . Hearing loss   . Hyperlipidemia   . Peripheral vascular disease ([place])   . Sleep apnea   . Stroke ([place])   . Vitamin B 12 deficiency   . Vitamin D deficiency     Past Surgical History:  Procedure Laterality Date  . RADIOLOGY WITH ANESTHESIA N/A 05/09/2015   Procedure: RADIOLOGY WITH ANESTHESIA;  Surgeon: Luanne Bras, MD;  Location: [facility];  Service: Radiology;  Laterality: N/A;  . TUBAL LIGATION      No outpatient medications have been marked as taking for the 04/11/19 encounter (Appointment) with Elam Dutch, MD.    ROS: no chest pain.  + shortness of breath with exertion  DATA:CTA images were reviewed from her recent scan.  This shows chronic occlusion of the right ICA.  There is  calcification of the mid stent but no significant stenosis.  There is a slight enlargement of a parotid mass first seen in 2017.     Assessment and Plan:  no significant left ICA stent recurrent stenosis.  Chronic right ICA occlusion.  Will get follow up duplex in 6 months. Try to quit smoking.  Continue Plavix Lipitor  Parotid mass will get ENT evaluation   I discussed the assessment and treatment plan with the patient. The patient was provided an opportunity to ask questions and all were answered. The patient agreed with the plan and demonstrated an understanding of the instructions.   The patient was advised to call back or seek an in-person evaluation if the symptoms worsen or if the condition fails to improve as anticipated.  I spent 10 minutes with the patient via telephone encounter.   Signed, Ruta Hinds [facility] Office: [phone]  04/11/2019, 11:04 AM

## 2019-04-22 DIAGNOSIS — K118 Other diseases of salivary glands: Secondary | ICD-10-CM | POA: Insufficient documentation

## 2019-04-23 ENCOUNTER — Other Ambulatory Visit (HOSPITAL_COMMUNITY): Payer: Self-pay | Admitting: Otolaryngology

## 2019-04-23 ENCOUNTER — Encounter (HOSPITAL_COMMUNITY): Payer: Self-pay | Admitting: Radiology

## 2019-04-23 DIAGNOSIS — K118 Other diseases of salivary glands: Secondary | ICD-10-CM

## 2019-04-23 NOTE — Progress Notes (Signed)
Monica Pugh Female, 69 y.o., 1950-09-21 MRN:  [id number] Phone:  [phone] (H) ... PCP:  Bonnita Nasuti, MD Primary Cvg:  Medicaid Pending/Medicaid Pending  RE: [place] FNA Biopsy Received: Today Message Contents  Corrie Mckusick, DO  Garth Bigness D  OK for US guided biopsy of right parotid mass.   Earleen Newport       Previous Messages   ----- Message -----  From: Garth Bigness D  Sent: 04/23/2019  2:48 PM EST  To: [facility] Procedure Requests  Subject: [place] FNA Biopsy                   Procedure: [place] FNA SALIVARY GLAND/PAROTID GLAND   Reason: Right Parotid Mass seen on CT   History: CT in computer   Provider: Izora Gala   Provider Contact: [phone]

## 2019-05-06 ENCOUNTER — Other Ambulatory Visit: Payer: Self-pay | Admitting: Student

## 2019-05-06 ENCOUNTER — Other Ambulatory Visit: Payer: Self-pay | Admitting: Radiology

## 2019-05-07 ENCOUNTER — Other Ambulatory Visit: Payer: Self-pay

## 2019-05-07 ENCOUNTER — Encounter (HOSPITAL_COMMUNITY): Payer: Self-pay

## 2019-05-07 ENCOUNTER — Ambulatory Visit (HOSPITAL_COMMUNITY)
Admission: RE | Admit: 2019-05-07 | Discharge: 2019-05-07 | Disposition: A | Discharge status: Home / Self Care | Payer: Medicare HMO | Source: Ambulatory Visit | Attending: Otolaryngology | Admitting: Otolaryngology

## 2019-05-07 DIAGNOSIS — K118 Other diseases of salivary glands: Secondary | ICD-10-CM

## 2019-05-07 DIAGNOSIS — E785 Hyperlipidemia, unspecified: Secondary | ICD-10-CM | POA: Diagnosis not present

## 2019-05-07 DIAGNOSIS — F17210 Nicotine dependence, cigarettes, uncomplicated: Secondary | ICD-10-CM | POA: Insufficient documentation

## 2019-05-07 DIAGNOSIS — Z79899 Other long term (current) drug therapy: Secondary | ICD-10-CM | POA: Diagnosis not present

## 2019-05-07 DIAGNOSIS — Z8673 Personal history of transient ischemic attack (TIA), and cerebral infarction without residual deficits: Secondary | ICD-10-CM | POA: Diagnosis not present

## 2019-05-07 DIAGNOSIS — K219 Gastro-esophageal reflux disease without esophagitis: Secondary | ICD-10-CM | POA: Insufficient documentation

## 2019-05-07 DIAGNOSIS — I6521 Occlusion and stenosis of right carotid artery: Secondary | ICD-10-CM | POA: Diagnosis not present

## 2019-05-07 DIAGNOSIS — D110 Benign neoplasm of parotid gland: Secondary | ICD-10-CM | POA: Insufficient documentation

## 2019-05-07 DIAGNOSIS — G4730 Sleep apnea, unspecified: Secondary | ICD-10-CM | POA: Insufficient documentation

## 2019-05-07 DIAGNOSIS — I739 Peripheral vascular disease, unspecified: Secondary | ICD-10-CM | POA: Diagnosis not present

## 2019-05-07 DIAGNOSIS — Z7982 Long term (current) use of aspirin: Secondary | ICD-10-CM | POA: Diagnosis not present

## 2019-05-07 MED ORDER — SODIUM CHLORIDE 0.9 % IV SOLN: INTRAVENOUS | Status: DC

## 2019-05-07 MED ORDER — MIDAZOLAM HCL 2 MG/2ML IJ SOLN
INTRAMUSCULAR | Status: AC
  Filled 2019-05-07: qty 2

## 2019-05-07 MED ORDER — MIDAZOLAM HCL 2 MG/2ML IJ SOLN
INTRAMUSCULAR | Status: AC | PRN
  Administered 2019-05-07: 0.5 mg via INTRAVENOUS

## 2019-05-07 MED ORDER — LIDOCAINE-EPINEPHRINE 1 %-1:100000 IJ SOLN
INTRAMUSCULAR | Status: AC
  Filled 2019-05-07: qty 1

## 2019-05-07 MED ORDER — FENTANYL CITRATE (PF) 100 MCG/2ML IJ SOLN
INTRAMUSCULAR | Status: AC | PRN
  Administered 2019-05-07: 25 ug via INTRAVENOUS

## 2019-05-07 MED ORDER — FENTANYL CITRATE (PF) 100 MCG/2ML IJ SOLN
INTRAMUSCULAR | Status: AC
  Filled 2019-05-07: qty 2

## 2019-05-07 NOTE — Discharge Instructions (Signed)

## 2019-05-07 NOTE — H&P (Signed)
Chief Complaint: Patient was seen in consultation today for right parotid mass biopsy at the request of Rosen,Jefry  Referring Physician(s): Rosen,Jefry  Supervising Physician: Sandi Mariscal  Patient Status: [facility] - Out-pt  History of Present Illness: Monica Pugh is a 69 y.o. female   Hx left MCA and L ICA revascularization with Dr Estanislado Pandy 04/2019 Follow up studies reveal enlarging Rt parotid mass  10.7 x 8.0 mm hyperdense lesion at the tail of the right parotid gland. 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 Lesion has increased in size since 2017. Recommend ENT consultation.  Pt referred to  ENT Dr Constance Holster  Now scheduled for biopsy of mass  Denies pain ++ smoker  Scheduled for biopsy today   Past Medical History:  Diagnosis Date  . Anemia   . Carotid artery occlusion   . GERD (gastroesophageal reflux disease)   . Hearing loss   . Hyperlipidemia   . Peripheral vascular disease ([place])   . Sleep apnea   . Stroke ([place])   . Vitamin B 12 deficiency   . Vitamin D deficiency     Past Surgical History:  Procedure Laterality Date  . RADIOLOGY WITH ANESTHESIA N/A 05/09/2015   Procedure: RADIOLOGY WITH ANESTHESIA;  Surgeon: Luanne Bras, MD;  Location: [place];  Service: Radiology;  Laterality: N/A;  . TUBAL LIGATION      Allergies: Patient has no known allergies.  Medications: Prior to Admission medications   Medication Sig Start Date End Date Taking? Authorizing Provider  aspirin 81 MG EC tablet Take 81 mg by mouth daily.    Yes [provider]  atorvastatin (LIPITOR) 40 MG tablet Take 40 mg by mouth daily.    Yes [provider]  clopidogrel (PLAVIX) 75 MG tablet Take 1 tablet (75 mg total) by mouth daily with breakfast. 05/21/15  Yes Angiulli, Lavon Paganini, PA-C  pantoprazole (PROTONIX) 40 MG tablet Take 1 tablet (40 mg total) by mouth 2 (two) times daily. Patient taking differently: Take 40 mg by mouth See admin instructions.  Take 40 mg daily, may take a second 40 mg dose as needed for acid reflux 05/21/15  Yes Angiulli, Lavon Paganini, PA-C  acetaminophen (TYLENOL) 500 MG tablet Take 500 mg by mouth every 8 (eight) hours as needed for moderate pain or headache.    [provider]     Family History  Problem Relation Age of Onset  . Cancer Mother   . Heart attack Father     Social History   Socioeconomic History  . Marital status: Married    Spouse name: Not on file  . Number of children: Not on file  . Years of education: Not on file  . Highest education level: Not on file  Occupational History  . Not on file  Tobacco Use  . Smoking status: Current Every Day Smoker    Packs/day: 1.50    Years: 40.00    Pack years: 60.00    Types: Cigarettes  . Smokeless tobacco: Never Used  Substance and Sexual Activity  . Alcohol use: Not Currently    Alcohol/week: 0.0 standard drinks  . Drug use: Never  . Sexual activity: Not on file  Other Topics Concern  . Not on file  Social History Narrative  . Not on file   Social Determinants of Health   Financial Resource Strain:   . Difficulty of Paying Living Expenses:   Food Insecurity:   . Worried About Charity fundraiser in  the Last Year:   . [place] in the Last Year:   Transportation Needs:   . Film/video editor (Medical):   Marland Kitchen Lack of Transportation (Non-Medical):   Physical Activity:   . Days of Exercise per Week:   . Minutes of Exercise per Session:   Stress:   . Feeling of Stress :   Social Connections:   . Frequency of Communication with Friends and Family:   . Frequency of Social Gatherings with Friends and Family:   . Attends Religious Services:   . Active Member of Clubs or Organizations:   . Attends Archivist Meetings:   Marland Kitchen Marital Status:      Review of Systems: A 12 point ROS discussed and pertinent positives are indicated in the HPI above.  All other systems are negative.  Review of Systems    Constitutional: Negative for activity change, fatigue and fever.  HENT: Negative for sore throat and trouble swallowing.   Respiratory: Negative for shortness of breath.   Cardiovascular: Negative for chest pain.  Gastrointestinal: Negative for abdominal pain.  Psychiatric/Behavioral: Negative for behavioral problems and confusion.    Vital Signs: BP (!) 176/46   Pulse 66   Temp (!) 96.6 F (35.9 C)   Ht 5\' 6"  (1.676 m)   Wt 172 lb (78 kg)   SpO2 100%   BMI 27.76 kg/m   Physical Exam Cardiovascular:     Rate and Rhythm: Normal rate and regular rhythm.     Heart sounds: Normal heart sounds.  Pulmonary:     Effort: Pulmonary effort is normal.     Breath sounds: Normal breath sounds.  Abdominal:     Palpations: Abdomen is soft.  Musculoskeletal:        General: Normal range of motion.  Skin:    General: Skin is warm and dry.  Neurological:     Mental Status: She is alert and oriented to person, place, and time.  Psychiatric:        Behavior: Behavior normal.        Thought Content: Thought content normal.        Judgment: Judgment normal.     Imaging: CT ANGIO HEAD W OR WO CONTRAST  Result Date: 04/08/2019 CLINICAL DATA:  Carotid artery stenosis. Post revascularization left M2 segment occlusion left carotid bifurcations 10 May 2015. EXAM: CT ANGIOGRAPHY HEAD AND NECK TECHNIQUE: Multidetector CT imaging of the head and neck was performed using the standard protocol during bolus administration of intravenous contrast. Multiplanar CT image reconstructions and MIPs were obtained to evaluate the vascular anatomy. Carotid stenosis measurements (when applicable) are obtained utilizing NASCET criteria, using the distal internal carotid diameter as the denominator. CONTRAST:  85mL ISOVUE-370 IOPAMIDOL (ISOVUE-370) INJECTION 76% COMPARISON:  CT head without contrast 05/11/2015. MR head without and with contrast 05/10/2015. Cerebral arteriogram 04/11/2015. CTA of the head and neck  05/09/2015 FINDINGS: CT HEAD FINDINGS Brain: Remote anterior left MCA infarcts are noted. Remote infarcts of the left basal ganglia are present. Ex vacuo dilation the left lateral ventricle is present. No acute infarct, hemorrhage, or mass lesion is present. Basal ganglia otherwise intact. Hemisphere is unremarkable. The brainstem and cerebellum are within normal limits. No significant extraaxial fluid collection is present. Vascular: Atherosclerotic changes are present within the cavernous internal carotid arteries bilaterally. No hyperdense vessel present. Skull: Calvarium is intact. No focal lytic or blastic lesions are present. No significant extracranial soft tissue lesion is present. Sinuses: The paranasal sinuses and  mastoid air cells are clear. Orbits: The globes and orbits are within normal limits. Review of the MIP images confirms the above findings CTA NECK FINDINGS Aortic arch: 3 vessel arch configuration present. Mural calcifications are present without aneurysm or stenosis. Great vessel origins are within normal limits. Right carotid system: Right common carotid artery is occluded proximally. The right external artery scratched at the right external carotid artery fills retrograde. There is no filling of the right internal carotid artery in the neck. Left carotid system: Left common carotid artery demonstrates mild narrowing of less than 50% at its origin. Left carotid stent is in place. There is no significant stenosis. Cervical left ICA is otherwise normal. Vertebral arteries: The vertebral arteries are codominant. Both vertebral arteries originate from the subclavian arteries without significant stenosis of greater than 50%. There is no significant stenosis or occlusion of either vertebral artery in the neck. Skeleton: Mild degenerative changes are present the cervical spine. There is straightening of the normal cervical lordosis. Endplate changes are greatest at C4-5, C5-6, and C6-7. No focal lytic  or blastic lesions are present. Other neck: Hyperdense lesion at the tail of the right parotid gland measures 10.7 by 8.0 mm if. No other focal salivary lesions are present. Upper chest: Centrilobular emphysematous changes present in the lung apices. No focal nodule or mass lesion present. Thoracic inlet is normal. Review of the MIP images confirms the above findings CTA HEAD FINDINGS Anterior circulation: Minimal atherosclerotic changes are present within the cavernous left internal carotid artery. There is no significant stenosis through the ICA terminus. The right internal carotid artery is reconstituted at the ophthalmic segment. ICA terminus is patent. The A1 and M1 segments are normal. The anterior communicating artery is patent. The ACA and MCA branch vessels are within normal limits. Posterior circulation: The left vertebral artery is slightly dominant to the right. PICA origins are visualized and normal. Basilar artery is normal. Both posterior cerebral arteries originate from basilar tip. Right posterior communicating artery is larger than the left. PCA branch vessels are within normal limits. Venous sinuses: The dural sinuses are patent. The right transverse sinus is dominant. The straight sinus and deep cerebral veins are intact. Cortical veins are unremarkable. Anatomic variants: None Review of the MIP images confirms the above findings IMPRESSION: 1. The right carotid artery is occluded in the common segment proximally. There is no reconstitution in the neck. 2. Reconstitution of the right internal carotid artery at the ophthalmic segment. 3. Left carotid stent in place without significant stenosis. 4. Remote anterior left MCA territory infarcts. 5. 10.7 x 8.0 mm hyperdense lesion at the tail of the right parotid gland. 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 Lesion has increased in size since 2017. Recommend ENT consultation. Electronically Signed   By: San Morelle M.D.    On: 04/08/2019 11:18   CT ANGIO NECK W OR WO CONTRAST  Result Date: 04/08/2019 CLINICAL DATA:  Carotid artery stenosis. Post revascularization left M2 segment occlusion left carotid bifurcations 10 May 2015. EXAM: CT ANGIOGRAPHY HEAD AND NECK TECHNIQUE: Multidetector CT imaging of the head and neck was performed using the standard protocol during bolus administration of intravenous contrast. Multiplanar CT image reconstructions and MIPs were obtained to evaluate the vascular anatomy. Carotid stenosis measurements (when applicable) are obtained utilizing NASCET criteria, using the distal internal carotid diameter as the denominator. CONTRAST:  43mL ISOVUE-370 IOPAMIDOL (ISOVUE-370) INJECTION 76% COMPARISON:  CT head without contrast 05/11/2015. MR head without and with  contrast 05/10/2015. Cerebral arteriogram 04/11/2015. CTA of the head and neck 05/09/2015 FINDINGS: CT HEAD FINDINGS Brain: Remote anterior left MCA infarcts are noted. Remote infarcts of the left basal ganglia are present. Ex vacuo dilation the left lateral ventricle is present. No acute infarct, hemorrhage, or mass lesion is present. Basal ganglia otherwise intact. Hemisphere is unremarkable. The brainstem and cerebellum are within normal limits. No significant extraaxial fluid collection is present. Vascular: Atherosclerotic changes are present within the cavernous internal carotid arteries bilaterally. No hyperdense vessel present. Skull: Calvarium is intact. No focal lytic or blastic lesions are present. No significant extracranial soft tissue lesion is present. Sinuses: The paranasal sinuses and mastoid air cells are clear. Orbits: The globes and orbits are within normal limits. Review of the MIP images confirms the above findings CTA NECK FINDINGS Aortic arch: 3 vessel arch configuration present. Mural calcifications are present without aneurysm or stenosis. Great vessel origins are within normal limits. Right carotid system: Right common  carotid artery is occluded proximally. The right external artery scratched at the right external carotid artery fills retrograde. There is no filling of the right internal carotid artery in the neck. Left carotid system: Left common carotid artery demonstrates mild narrowing of less than 50% at its origin. Left carotid stent is in place. There is no significant stenosis. Cervical left ICA is otherwise normal. Vertebral arteries: The vertebral arteries are codominant. Both vertebral arteries originate from the subclavian arteries without significant stenosis of greater than 50%. There is no significant stenosis or occlusion of either vertebral artery in the neck. Skeleton: Mild degenerative changes are present the cervical spine. There is straightening of the normal cervical lordosis. Endplate changes are greatest at C4-5, C5-6, and C6-7. No focal lytic or blastic lesions are present. Other neck: Hyperdense lesion at the tail of the right parotid gland measures 10.7 by 8.0 mm if. No other focal salivary lesions are present. Upper chest: Centrilobular emphysematous changes present in the lung apices. No focal nodule or mass lesion present. Thoracic inlet is normal. Review of the MIP images confirms the above findings CTA HEAD FINDINGS Anterior circulation: Minimal atherosclerotic changes are present within the cavernous left internal carotid artery. There is no significant stenosis through the ICA terminus. The right internal carotid artery is reconstituted at the ophthalmic segment. ICA terminus is patent. The A1 and M1 segments are normal. The anterior communicating artery is patent. The ACA and MCA branch vessels are within normal limits. Posterior circulation: The left vertebral artery is slightly dominant to the right. PICA origins are visualized and normal. Basilar artery is normal. Both posterior cerebral arteries originate from basilar tip. Right posterior communicating artery is larger than the left. PCA  branch vessels are within normal limits. Venous sinuses: The dural sinuses are patent. The right transverse sinus is dominant. The straight sinus and deep cerebral veins are intact. Cortical veins are unremarkable. Anatomic variants: None Review of the MIP images confirms the above findings IMPRESSION: 1. The right carotid artery is occluded in the common segment proximally. There is no reconstitution in the neck. 2. Reconstitution of the right internal carotid artery at the ophthalmic segment. 3. Left carotid stent in place without significant stenosis. 4. Remote anterior left MCA territory infarcts. 5. 10.7 x 8.0 mm hyperdense lesion at the tail of the right parotid gland. 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 Lesion has increased in size since 2017. Recommend ENT consultation. Electronically Signed   By: Wynetta Fines.D.  On: 04/08/2019 11:18    Labs:  CBC: No results for input(s): WBC, HGB, HCT, PLT in the last 8760 hours.  COAGS: No results for input(s): INR, APTT in the last 8760 hours.  BMP: No results for input(s): NA, K, CL, CO2, GLUCOSE, BUN, CALCIUM, CREATININE, GFRNONAA, GFRAA in the last 8760 hours.  Invalid input(s): CMP  LIVER FUNCTION TESTS: No results for input(s): BILITOT, AST, ALT, ALKPHOS, PROT, ALBUMIN in the last 8760 hours.  TUMOR MARKERS: No results for input(s): AFPTM, CEA, CA199, CHROMGRNA in the last 8760 hours.  Assessment and Plan:  Right parotid mass-- enlarged from 2017 imaging ENT Dr Constance Holster requesting biopsy of same Risks and benefits of right parotid mass biopsy was discussed with the patient and/or patient's family including, but not limited to bleeding, infection, damage to adjacent structures or low yield requiring additional tests.  All of the questions were answered and there is agreement to proceed. Consent signed and in chart.   Thank you for this interesting consult.  I greatly enjoyed meeting Colie Bakeman and look  forward to participating in their care.  A copy of this report was sent to the requesting provider on this date.  Electronically Signed: Lavonia Drafts, PA-C 05/07/2019, 12:55 PM   I spent a total of   30 minutes  in face to face in clinical consultation, greater than 50% of which was counseling/coordinating care for right parotid mass bx

## 2019-05-07 NOTE — Procedures (Signed)
Pre Procedure Dx: Right parotid nodule Post Procedural Dx: Same  Technically successful US guided biopsy of indeterminate nodule within the right parotid gland.  EBL: None No immediate complications.   Ronny Bacon, MD Pager #: [phone]

## 2019-05-08 ENCOUNTER — Other Ambulatory Visit (HOSPITAL_COMMUNITY): Payer: Self-pay | Admitting: Otolaryngology

## 2019-05-08 DIAGNOSIS — K118 Other diseases of salivary glands: Secondary | ICD-10-CM

## 2019-05-08 LAB — SURGICAL PATHOLOGY

## 2020-03-27 ENCOUNTER — Ambulatory Visit: Payer: Medicare HMO

## 2020-03-27 ENCOUNTER — Encounter (HOSPITAL_COMMUNITY): Payer: Medicare HMO

## 2020-04-21 ENCOUNTER — Other Ambulatory Visit: Payer: Self-pay

## 2020-04-21 DIAGNOSIS — I6523 Occlusion and stenosis of bilateral carotid arteries: Secondary | ICD-10-CM

## 2020-04-30 ENCOUNTER — Other Ambulatory Visit: Payer: Self-pay

## 2020-04-30 ENCOUNTER — Ambulatory Visit (HOSPITAL_COMMUNITY)
Admission: RE | Admit: 2020-04-30 | Discharge: 2020-04-30 | Disposition: A | Discharge status: Home / Self Care | Payer: Medicare HMO | Source: Ambulatory Visit | Attending: Vascular Surgery | Admitting: Vascular Surgery

## 2020-04-30 ENCOUNTER — Ambulatory Visit (INDEPENDENT_AMBULATORY_CARE_PROVIDER_SITE_OTHER): Payer: Medicare HMO | Admitting: Physician Assistant

## 2020-04-30 VITALS — BP 156/78 | HR 75 | Temp 97.3°F | Resp 20 | Ht 66.0 in | Wt 186.9 lb

## 2020-04-30 DIAGNOSIS — I872 Venous insufficiency (chronic) (peripheral): Secondary | ICD-10-CM | POA: Insufficient documentation

## 2020-04-30 DIAGNOSIS — E039 Hypothyroidism, unspecified: Secondary | ICD-10-CM | POA: Insufficient documentation

## 2020-04-30 DIAGNOSIS — H900 Conductive hearing loss, bilateral: Secondary | ICD-10-CM | POA: Insufficient documentation

## 2020-04-30 DIAGNOSIS — I6523 Occlusion and stenosis of bilateral carotid arteries: Secondary | ICD-10-CM | POA: Diagnosis not present

## 2020-04-30 DIAGNOSIS — D518 Other vitamin B12 deficiency anemias: Secondary | ICD-10-CM | POA: Insufficient documentation

## 2020-04-30 DIAGNOSIS — I5032 Chronic diastolic (congestive) heart failure: Secondary | ICD-10-CM | POA: Insufficient documentation

## 2020-04-30 DIAGNOSIS — J309 Allergic rhinitis, unspecified: Secondary | ICD-10-CM | POA: Insufficient documentation

## 2020-04-30 DIAGNOSIS — I63329 Cerebral infarction due to thrombosis of unspecified anterior cerebral artery: Secondary | ICD-10-CM | POA: Insufficient documentation

## 2020-04-30 DIAGNOSIS — I739 Peripheral vascular disease, unspecified: Secondary | ICD-10-CM | POA: Insufficient documentation

## 2020-04-30 DIAGNOSIS — I10 Essential (primary) hypertension: Secondary | ICD-10-CM | POA: Insufficient documentation

## 2020-04-30 DIAGNOSIS — E782 Mixed hyperlipidemia: Secondary | ICD-10-CM | POA: Insufficient documentation

## 2020-04-30 DIAGNOSIS — E1151 Type 2 diabetes mellitus with diabetic peripheral angiopathy without gangrene: Secondary | ICD-10-CM | POA: Insufficient documentation

## 2020-04-30 DIAGNOSIS — G4733 Obstructive sleep apnea (adult) (pediatric): Secondary | ICD-10-CM | POA: Insufficient documentation

## 2020-04-30 DIAGNOSIS — I6529 Occlusion and stenosis of unspecified carotid artery: Secondary | ICD-10-CM | POA: Insufficient documentation

## 2020-04-30 DIAGNOSIS — E785 Hyperlipidemia, unspecified: Secondary | ICD-10-CM | POA: Insufficient documentation

## 2020-04-30 DIAGNOSIS — E559 Vitamin D deficiency, unspecified: Secondary | ICD-10-CM | POA: Insufficient documentation

## 2020-04-30 DIAGNOSIS — K219 Gastro-esophageal reflux disease without esophagitis: Secondary | ICD-10-CM | POA: Insufficient documentation

## 2020-04-30 NOTE — Progress Notes (Signed)
Office Note     CC:  follow up Requesting Provider:  Bonnita Nasuti, MD  HPI: Monica Pugh is a 70 y.o. (Jun 25, 1950) female who presents had virtual visit on 04/11/19 for possible recurrent left ICA stenosis seen on duplex exam for evaluation of a left side bruit.  She had had CTA of the neck and was evaluated by Dr. Oneida Alar.  She had evidence of right ICA occlusion.  She was to return in 6 months for follow-up carotid duplex.  She had a left ICA stent placed by Dr Estanislado Pandy for a stroke 2017.    She remains on Plavix and Lipitor.    She states her main deficit is hearing loss and mild right upper and lower extremity weakness.   Denies monocular blindness, slurred speech, facial drooping, extremity paralysis or numbness.  The patient is not diabetic.  She continues to smoke cigarettes.  Her husband accompanies her today.  Past Medical History:  Diagnosis Date  . Anemia   . Carotid artery occlusion   . GERD (gastroesophageal reflux disease)   . Hearing loss   . Hyperlipidemia   . Peripheral vascular disease ([place])   . Sleep apnea   . Stroke ([place])   . Vitamin B 12 deficiency   . Vitamin D deficiency     Past Surgical History:  Procedure Laterality Date  . RADIOLOGY WITH ANESTHESIA N/A 05/09/2015   Procedure: RADIOLOGY WITH ANESTHESIA;  Surgeon: Luanne Bras, MD;  Location: [facility];  Service: Radiology;  Laterality: N/A;  . TUBAL LIGATION      Social History   Socioeconomic History  . Marital status: Married    Spouse name: Not on file  . Number of children: Not on file  . Years of education: Not on file  . Highest education level: Not on file  Occupational History  . Not on file  Tobacco Use  . Smoking status: Current Every Day Smoker    Packs/day: 1.50    Years: 40.00    Pack years: 60.00    Types: Cigarettes  . Smokeless tobacco: Never Used  Vaping Use  . Vaping Use: Never used  Substance and Sexual Activity  . Alcohol use: Not Currently    Alcohol/week: 0.0  standard drinks  . Drug use: Never  . Sexual activity: Not on file  Other Topics Concern  . Not on file  Social History Narrative  . Not on file   Social Determinants of Health   Financial Resource Strain: Not on file  Food Insecurity: Not on file  Transportation Needs: Not on file  Physical Activity: Not on file  Stress: Not on file  Social Connections: Not on file  Intimate Partner Violence: Not on file   Family History  Problem Relation Age of Onset  . Cancer Mother   . Heart attack Father     Current Outpatient Medications  Medication Sig Dispense Refill  . acetaminophen (TYLENOL) 500 MG tablet Take 500 mg by mouth every 8 (eight) hours as needed for moderate pain or headache.    Marland Kitchen atorvastatin (LIPITOR) 40 MG tablet Take 40 mg by mouth daily.     . Cholecalciferol (VITAMIN D3) 1.25 MG (50000 UT) CAPS 1 capsule    . clopidogrel (PLAVIX) 75 MG tablet Take 1 tablet (75 mg total) by mouth daily with breakfast. 30 tablet 1  . pantoprazole (PROTONIX) 40 MG tablet Take 1 tablet (40 mg total) by mouth 2 (two) times daily. (Patient taking differently: Take 40 mg by  mouth See admin instructions. Take 40 mg daily, may take a second 40 mg dose as needed for acid reflux) 60 tablet 2  . vitamin B-12 (CYANOCOBALAMIN) 100 MCG tablet 1 ml    . aspirin 81 MG EC tablet Take 81 mg by mouth daily.  (Patient not taking: Reported on 04/30/2020)     No current facility-administered medications for this visit.    No Known Allergies   REVIEW OF SYSTEMS:   [X]  denotes positive finding, [ ]  denotes negative finding Cardiac  Comments:  Chest pain or chest pressure:    Shortness of breath upon exertion:    Short of breath when lying flat:    Irregular heart rhythm:        Vascular    Pain in calf, thigh, or hip brought on by ambulation:    Pain in feet at night that wakes you up from your sleep:     Blood clot in your veins:    Leg swelling:         Pulmonary    Oxygen at home:     Productive cough:     Wheezing:         Neurologic    Sudden weakness in arms or legs:     Sudden numbness in arms or legs:     Sudden onset of difficulty speaking or slurred speech:    Temporary loss of vision in one eye:     Problems with dizziness:         Gastrointestinal    Blood in stool:     Vomited blood:         Genitourinary    Burning when urinating:     Blood in urine:        Psychiatric    Major depression:         Hematologic    Bleeding problems:    Problems with blood clotting too easily:        Skin    Rashes or ulcers:        Constitutional    Fever or chills:      PHYSICAL EXAMINATION:  Vitals:   04/30/20 1145 04/30/20 1147  BP: (!) 180/71 (!) 156/78  Pulse: 75   Resp: 20   Temp: (!) 97.3 F (36.3 C)   TempSrc: Temporal   SpO2: 97%   Weight: 186 lb 14.4 oz (84.8 kg)   Height: 5\' 6"  (1.676 m)     General:  WDWN in NAD; vital signs documented above Gait: Unaided, no ataxia HENT: WNL, normocephalic Pulmonary: normal non-labored breathing , without Rales, rhonchi,  wheezing Cardiac: regular HR, without  Murmurs with bilateral carotid bruits Abdomen: soft, NT, no masses Skin: without rashes Vascular Exam/Pulses: 2+ radial pulses bilaterally Extremities: without ischemic changes, without Gangrene , without cellulitis; without open wounds;  Musculoskeletal: no muscle wasting or atrophy.  5/5 bilateral upper extremity grip, triceps, biceps strength.  5/5 lower extremity hamstring and quadriceps strength.  Neurologic: A&O X 3;  No focal weakness or paresthesias are detected Psychiatric:  The pt has Normal affect.   Non-Invasive Vascular Imaging:   04/30/2020 Right Carotid: Evidence consistent with a total occlusion of the right  ICA.   Left Carotid: Limited color and spectral Doppler flow observed within the  common carotid and internal carotid arteries. Pre-occlusion thump  noted.   Vertebrals: Bilateral vertebral arteries demonstrate  antegrade flow.   Subclavians: Normal flow hemodynamics were seen in bilateral subclavian arteries.   ASSESSMENT/PLAN::  70 y.o. female here for follow up for carotid artery stenosis.  She has no symptoms referable to carotid artery disease.  Her ultrasound shows likely stenosis of her indwelling left carotid stent.  I discussed these results with Dr. Oneida Alar.  Informed the patient and her husband that we will refer her back to Dr. Estanislado Pandy for evaluation.  Continue Plavix and statin.   Barbie Banner, PA-C [facility] [phone]  Clinic MD:   Oneida Alar

## 2020-05-05 ENCOUNTER — Other Ambulatory Visit (HOSPITAL_COMMUNITY): Payer: Self-pay | Admitting: Interventional Radiology

## 2020-05-05 DIAGNOSIS — I6523 Occlusion and stenosis of bilateral carotid arteries: Secondary | ICD-10-CM

## 2020-05-08 ENCOUNTER — Other Ambulatory Visit: Payer: Self-pay | Admitting: Student

## 2020-05-11 ENCOUNTER — Ambulatory Visit (HOSPITAL_COMMUNITY)
Admission: RE | Admit: 2020-05-11 | Discharge: 2020-05-11 | Disposition: A | Discharge status: Home / Self Care | Payer: Medicare HMO | Source: Ambulatory Visit | Attending: Interventional Radiology | Admitting: Interventional Radiology

## 2020-05-11 ENCOUNTER — Other Ambulatory Visit (HOSPITAL_COMMUNITY): Payer: Self-pay | Admitting: Interventional Radiology

## 2020-05-11 ENCOUNTER — Encounter (HOSPITAL_COMMUNITY): Payer: Self-pay

## 2020-05-11 ENCOUNTER — Other Ambulatory Visit: Payer: Self-pay

## 2020-05-11 DIAGNOSIS — Z7902 Long term (current) use of antithrombotics/antiplatelets: Secondary | ICD-10-CM | POA: Insufficient documentation

## 2020-05-11 DIAGNOSIS — F17210 Nicotine dependence, cigarettes, uncomplicated: Secondary | ICD-10-CM | POA: Insufficient documentation

## 2020-05-11 DIAGNOSIS — I63232 Cerebral infarction due to unspecified occlusion or stenosis of left carotid arteries: Secondary | ICD-10-CM | POA: Diagnosis not present

## 2020-05-11 DIAGNOSIS — Z79899 Other long term (current) drug therapy: Secondary | ICD-10-CM | POA: Diagnosis not present

## 2020-05-11 DIAGNOSIS — I6523 Occlusion and stenosis of bilateral carotid arteries: Secondary | ICD-10-CM

## 2020-05-11 HISTORY — PX: IR US GUIDE VASC ACCESS RIGHT: IMG2390

## 2020-05-11 HISTORY — PX: IR ANGIO INTRA EXTRACRAN SEL COM CAROTID INNOMINATE UNI R MOD SED: IMG5359

## 2020-05-11 HISTORY — PX: IR ANGIO INTRA EXTRACRAN SEL INTERNAL CAROTID UNI L MOD SED: IMG5361

## 2020-05-11 HISTORY — PX: IR ANGIO VERTEBRAL SEL SUBCLAVIAN INNOMINATE BILAT MOD SED: IMG5366

## 2020-05-11 LAB — PROTIME-INR
INR: 1 (ref 0.8–1.2)
Prothrombin Time: 12.3 seconds (ref 11.4–15.2)

## 2020-05-11 LAB — CBC WITH DIFFERENTIAL/PLATELET
Abs Immature Granulocytes: 0.03 10*3/uL (ref 0.00–0.07)
Basophils Absolute: 0.1 10*3/uL (ref 0.0–0.1)
Basophils Relative: 1 %
Eosinophils Absolute: 0.1 10*3/uL (ref 0.0–0.5)
Eosinophils Relative: 1 %
HCT: 48.6 % — ABNORMAL HIGH (ref 36.0–46.0)
Hemoglobin: 15.9 g/dL — ABNORMAL HIGH (ref 12.0–15.0)
Immature Granulocytes: 0 %
Lymphocytes Relative: 28 %
Lymphs Abs: 2.7 10*3/uL (ref 0.7–4.0)
MCH: 31.5 pg (ref 26.0–34.0)
MCHC: 32.7 g/dL (ref 30.0–36.0)
MCV: 96.4 fL (ref 80.0–100.0)
Monocytes Absolute: 0.9 10*3/uL (ref 0.1–1.0)
Monocytes Relative: 9 %
Neutro Abs: 6 10*3/uL (ref 1.7–7.7)
Neutrophils Relative %: 61 %
Platelets: 226 10*3/uL (ref 150–400)
RBC: 5.04 MIL/uL (ref 3.87–5.11)
RDW: 14 % (ref 11.5–15.5)
WBC: 9.7 10*3/uL (ref 4.0–10.5)
nRBC: 0 % (ref 0.0–0.2)

## 2020-05-11 LAB — BASIC METABOLIC PANEL
Anion gap: 6 (ref 5–15)
BUN: 6 mg/dL — ABNORMAL LOW (ref 8–23)
CO2: 29 mmol/L (ref 22–32)
Calcium: 8.8 mg/dL — ABNORMAL LOW (ref 8.9–10.3)
Chloride: 103 mmol/L (ref 98–111)
Creatinine, Ser: 0.88 mg/dL (ref 0.44–1.00)
GFR, Estimated: >60 mL/min (ref >60)
Glucose, Bld: 94 mg/dL (ref 70–99)
Potassium: 4 mmol/L (ref 3.5–5.1)
Sodium: 138 mmol/L (ref 135–145)

## 2020-05-11 MED ORDER — VERAPAMIL HCL 2.5 MG/ML IV SOLN
INTRAVENOUS | Status: AC
  Filled 2020-05-11: qty 2

## 2020-05-11 MED ORDER — HEPARIN SODIUM (PORCINE) 1000 UNIT/ML IJ SOLN
INTRAMUSCULAR | Status: AC
  Filled 2020-05-11: qty 1

## 2020-05-11 MED ORDER — MIDAZOLAM HCL 2 MG/2ML IJ SOLN
INTRAMUSCULAR | Status: AC
  Filled 2020-05-11: qty 2

## 2020-05-11 MED ORDER — SODIUM CHLORIDE 0.9 % IV BOLUS: 250.0000 mL | Freq: Once | INTRAVENOUS | Status: DC

## 2020-05-11 MED ORDER — IOHEXOL 300 MG/ML  SOLN
150.0000 mL | Freq: Once | INTRAMUSCULAR | Status: AC | PRN
  Administered 2020-05-11: 75 mL

## 2020-05-11 MED ORDER — NITROGLYCERIN 1 MG/10 ML FOR IR/CATH LAB
INTRA_ARTERIAL | Status: AC | PRN
  Administered 2020-05-11: 200 ug via INTRA_ARTERIAL

## 2020-05-11 MED ORDER — HEPARIN SODIUM (PORCINE) 1000 UNIT/ML IJ SOLN
INTRAMUSCULAR | Status: AC | PRN
  Administered 2020-05-11: 2000 [IU] via INTRA_ARTERIAL

## 2020-05-11 MED ORDER — SODIUM CHLORIDE 0.9 % IV SOLN: INTRAVENOUS | Status: DC

## 2020-05-11 MED ORDER — NITROGLYCERIN 1 MG/10 ML FOR IR/CATH LAB
INTRA_ARTERIAL | Status: AC
  Filled 2020-05-11: qty 10

## 2020-05-11 MED ORDER — MIDAZOLAM HCL 2 MG/2ML IJ SOLN
INTRAMUSCULAR | Status: AC | PRN
  Administered 2020-05-11: 1 mg via INTRAVENOUS

## 2020-05-11 MED ORDER — FENTANYL CITRATE (PF) 100 MCG/2ML IJ SOLN
INTRAMUSCULAR | Status: AC | PRN
  Administered 2020-05-11: 25 ug via INTRAVENOUS

## 2020-05-11 MED ORDER — VERAPAMIL HCL 2.5 MG/ML IV SOLN: INTRA_ARTERIAL | Status: AC | PRN

## 2020-05-11 MED ORDER — LIDOCAINE HCL 1 % IJ SOLN
INTRAMUSCULAR | Status: AC | PRN
  Administered 2020-05-11: 5 mL

## 2020-05-11 MED ORDER — LIDOCAINE HCL 1 % IJ SOLN
INTRAMUSCULAR | Status: AC
  Filled 2020-05-11: qty 20

## 2020-05-11 MED ORDER — FENTANYL CITRATE (PF) 100 MCG/2ML IJ SOLN
INTRAMUSCULAR | Status: AC
  Filled 2020-05-11: qty 2

## 2020-05-11 NOTE — Procedures (Signed)
S/P 4 vessel cerebral arteriogram RT rad approach. Findings .. 1. Occluded ICAs bilaterally. 2.Robust reconstitution of ACAS and MCAs vis PCOMs bilaterally 3. Occluded Lt ICA /CCA stent. S.Remijio Holleran MD

## 2020-05-11 NOTE — Sedation Documentation (Signed)
Patient transported to short stay. TR band assessed with melanie RN at the bedside. Site is clean, dry and intact. No bleeding noted from puncture site. Radial pulse intact, cap refill intact.

## 2020-05-11 NOTE — Discharge Instructions (Addendum)
Cerebral Angiogram, Care After This sheet gives you information about how to care for yourself after your procedure. Your health care provider may also give you more specific instructions. If you have problems or questions, contact your health care provider. What can I expect after the procedure? After the procedure, it is common to have:  Bruising and tenderness at the catheter insertion site.  A mild headache. Follow these instructions at home: Insertion site care  Follow instructions from your health care provider about how to take care of the insertion site. Make sure you: ? Wash your hands with soap and water before and after you change your bandage (dressing). If soap and water are not available, use hand sanitizer. ? Change your dressing as told by your health care provider.  Do not take baths, swim, or use a hot tub until your health care provider approves. You may shower 24-48 hours after the procedure, or as told by your health care provider.  To clean your insertion site: ? Gently wash the site with plain soap and water. ? Pat the area dry with a clean towel. ? Do not rub the site. This may cause bleeding.  Do not apply powder or lotion to the site. Keep the site clean and dry. Infection signs Check your incision area every day for signs of infection. Check for:  Redness, swelling, or pain.  Fluid or blood.  Warmth.  Pus or a bad smell.   Activity  Do not drive for 24 hours if you were given a sedative during your procedure.  Rest as told by your health care provider.  Do not lift anything that is heavier than 10 lb (4.5 kg), or the limit that you are told, until your health care provider says that it is safe.  Return to your normal activities as told by your health care provider, usually in about a week. Ask your health care provider what activities are safe for you. General instructions  If your insertion site starts to bleed, lie flat and put pressure on the  site. If the bleeding does not stop, get help right away. This is a medical emergency.  Do not use any products that contain nicotine or tobacco, such as cigarettes, e-cigarettes, and chewing tobacco. If you need help quitting, ask your health care provider.  Take over-the-counter and prescription medicines only as told by your health care provider.  Drink enough fluid to keep your urine pale yellow. This helps flush the contrast dye from your body.  Keep all follow-up visits as directed by your health care provider. This is important.   Contact a health care provider if:  You have a fever or chills.  You have redness, swelling, or pain around your insertion site.  You have fluid or blood coming from your insertion site.  The insertion site feels warm to the touch.  You have pus or a bad smell coming from your insertion site.  You notice blood collecting in the tissue around the insertion site (hematoma). The hematoma may be painful to the touch. Get help right away if:  You have chest pain or trouble breathing.  You have severe pain or swelling at the insertion site.  The insertion area bleeds, and bleeding continues after 30 minutes of holding steady pressure on the site.  The arm or leg where the catheter was inserted is pale, cold, numb, tingling, or weak.  You have a rash.  You have any symptoms of a stroke. "BE FAST" is  an easy way to remember the main warning signs of a stroke: ? B - Balance. Signs are dizziness, sudden trouble walking, or loss of balance. ? E - Eyes. Signs are trouble seeing or a sudden change in vision. ? F - Face. Signs are sudden weakness or numbness of the face, or the face or eyelid drooping on one side. ? A - Arms. Signs are weakness or numbness in an arm. This happens suddenly and usually on one side of the body. ? S - Speech. Signs are sudden trouble speaking, slurred speech, or trouble understanding what people say. ? T - Time. Time to call  emergency services. Write down what time symptoms started.  You have other signs of a stroke, such as: ? A sudden, severe headache with no known cause. ? Nausea or vomiting. ? Seizure. These symptoms may represent a serious problem that is an emergency. Do not wait to see if the symptoms will go away. Get medical help right away. Call your local emergency services (911 in the [place] Do not drive yourself to the hospital. Summary  Bruising and tenderness at the insertion site are common.  Follow your health care provider's instructions about caring for your insertion site. Change dressing and clean the area as instructed.  If your insertion site bleeds, apply direct pressure until bleeding stops.  Return to your normal activities as told by your health care provider. Ask what activities are safe.  Rest and drink plenty of fluids. This information is not intended to replace advice given to you by your health care provider. Make sure you discuss any questions you have with your health care provider. Document Revised: 08/28/2018 Document Reviewed: 08/28/2018 Elsevier Patient Education  [place]  This sheet gives you information about how to care for yourself after your procedure. Your health care provider may also give you more specific instructions. If you have problems or questions, contact your health care provider. What can I expect after the procedure? After the procedure, it is common to have:  Bruising and tenderness at the catheter insertion area. Follow these instructions at home: Medicines  Take over-the-counter and prescription medicines only as told by your health care provider. Insertion site care  Follow instructions from your health care provider about how to take care of your insertion site. Make sure you: ? Wash your hands with soap and water before you change your bandage (dressing). If soap and water are not available, use hand  sanitizer. ? Change your dressing as told by your health care provider. ? Leave stitches (sutures), skin glue, or adhesive strips in place. These skin closures may need to stay in place for 2 weeks or longer. If adhesive strip edges start to loosen and curl up, you may trim the loose edges. Do not remove adhesive strips completely unless your health care provider tells you to do that.  Check your insertion site every day for signs of infection. Check for: ? Redness, swelling, or pain. ? Fluid or blood. ? Pus or a bad smell. ? Warmth.  Do not take baths, swim, or use a hot tub until your health care provider approves.  You may shower 24-48 hours after the procedure, or as directed by your health care provider. ? Remove the dressing and gently wash the site with plain soap and water. ? Pat the area dry with a clean towel. ? Do not rub the site. That could cause bleeding.  Do not  apply powder or lotion to the site. Activity  For 24 hours after the procedure, or as directed by your health care provider: ? Do not flex or bend the affected arm. ? Do not push or pull heavy objects with the affected arm. ? Do not drive yourself home from the hospital or clinic. You may drive 24 hours after the procedure unless your health care provider tells you not to. ? Do not operate machinery or power tools.  Do not lift anything that is heavier than 10 lb (4.5 kg), or the limit that you are told, until your health care provider says that it is safe.  Ask your health care provider when it is okay to: ? Return to work or school. ? Resume usual physical activities or sports. ? Resume sexual activity.   General instructions  If the catheter site starts to bleed, raise your arm and put firm pressure on the site. If the bleeding does not stop, get help right away. This is a medical emergency.  If you went home on the same day as your procedure, a responsible adult should be with you for the first 24 hours  after you arrive home.  Keep all follow-up visits as told by your health care provider. This is important. Contact a health care provider if:  You have a fever.  You have redness, swelling, or yellow drainage around your insertion site. Get help right away if:  You have unusual pain at the radial site.  The catheter insertion area swells very fast.  The insertion area is bleeding, and the bleeding does not stop when you hold steady pressure on the area.  Your arm or hand becomes pale, cool, tingly, or numb. These symptoms may represent a serious problem that is an emergency. Do not wait to see if the symptoms will go away. Get medical help right away. Call your local emergency services (911 in the [place] Do not drive yourself to the hospital. Summary  After the procedure, it is common to have bruising and tenderness at the site.  Follow instructions from your health care provider about how to take care of your radial site wound. Check the wound every day for signs of infection.  Do not lift anything that is heavier than 10 lb (4.5 kg), or the limit that you are told, until your health care provider says that it is safe. This information is not intended to replace advice given to you by your health care provider. Make sure you discuss any questions you have with your health care provider. Document Revised: 03/15/2017 Document Reviewed: 03/15/2017 Elsevier Patient Education  2021 [place]. Cerebral Angiogram, Care After This sheet gives you information about how to care for yourself after your procedure. Your health care provider may also give you more specific instructions. If you have problems or questions, contact your health care provider. What can I expect after the procedure? After the procedure, it is common to have:  Bruising and tenderness at the catheter insertion site.  A mild headache. Follow these instructions at home: Insertion site care  Follow instructions  from your health care provider about how to take care of the insertion site. Make sure you: ? Wash your hands with soap and water before and after you change your bandage (dressing). If soap and water are not available, use hand sanitizer. ? Change your dressing as told by your health care provider.  Do not take baths, swim, or use a hot tub until your  health care provider approves. You may shower 24-48 hours after the procedure, or as told by your health care provider.  To clean your insertion site: ? Gently wash the site with plain soap and water. ? Pat the area dry with a clean towel. ? Do not rub the site. This may cause bleeding.  Do not apply powder or lotion to the site. Keep the site clean and dry. Infection signs Check your incision area every day for signs of infection. Check for:  Redness, swelling, or pain.  Fluid or blood.  Warmth.  Pus or a bad smell.   Activity  Do not drive for 24 hours if you were given a sedative during your procedure.  Rest as told by your health care provider.  Do not lift anything that is heavier than 10 lb (4.5 kg), or the limit that you are told, until your health care provider says that it is safe.  Return to your normal activities as told by your health care provider, usually in about a week. Ask your health care provider what activities are safe for you. General instructions  If your insertion site starts to bleed, lie flat and put pressure on the site. If the bleeding does not stop, get help right away. This is a medical emergency.  Do not use any products that contain nicotine or tobacco, such as cigarettes, e-cigarettes, and chewing tobacco. If you need help quitting, ask your health care provider.  Take over-the-counter and prescription medicines only as told by your health care provider.  Drink enough fluid to keep your urine pale yellow. This helps flush the contrast dye from your body.  Keep all follow-up visits as directed by  your health care provider. This is important.   Contact a health care provider if:  You have a fever or chills.  You have redness, swelling, or pain around your insertion site.  You have fluid or blood coming from your insertion site.  The insertion site feels warm to the touch.  You have pus or a bad smell coming from your insertion site.  You notice blood collecting in the tissue around the insertion site (hematoma). The hematoma may be painful to the touch. Get help right away if:  You have chest pain or trouble breathing.  You have severe pain or swelling at the insertion site.  The insertion area bleeds, and bleeding continues after 30 minutes of holding steady pressure on the site.  The arm or leg where the catheter was inserted is pale, cold, numb, tingling, or weak.  You have a rash.  You have any symptoms of a stroke. "BE FAST" is an easy way to remember the main warning signs of a stroke: ? B - Balance. Signs are dizziness, sudden trouble walking, or loss of balance. ? E - Eyes. Signs are trouble seeing or a sudden change in vision. ? F - Face. Signs are sudden weakness or numbness of the face, or the face or eyelid drooping on one side. ? A - Arms. Signs are weakness or numbness in an arm. This happens suddenly and usually on one side of the body. ? S - Speech. Signs are sudden trouble speaking, slurred speech, or trouble understanding what people say. ? T - Time. Time to call emergency services. Write down what time symptoms started.  You have other signs of a stroke, such as: ? A sudden, severe headache with no known cause. ? Nausea or vomiting. ? Seizure. These symptoms may represent  a serious problem that is an emergency. Do not wait to see if the symptoms will go away. Get medical help right away. Call your local emergency services (911 in the [place] Do not drive yourself to the hospital. Summary  Bruising and tenderness at the insertion site are  common.  Follow your health care provider's instructions about caring for your insertion site. Change dressing and clean the area as instructed.  If your insertion site bleeds, apply direct pressure until bleeding stops.  Return to your normal activities as told by your health care provider. Ask what activities are safe.  Rest and drink plenty of fluids. This information is not intended to replace advice given to you by your health care provider. Make sure you discuss any questions you have with your health care provider. Document Revised: 08/28/2018 Document Reviewed: 08/28/2018 Elsevier Patient Education  2021 [place]. Moderate Conscious Sedation, Adult Sedation is the use of medicines to promote relaxation and to relieve discomfort and anxiety. Moderate conscious sedation is a type of sedation. Under moderate conscious sedation, you are less alert than normal, but you are still able to respond to instructions, touch, or both. Moderate conscious sedation is used during short medical and dental procedures. It is milder than deep sedation, which is a type of sedation under which you cannot be easily woken up. It is also milder than general anesthesia, which is the use of medicines to make you unconscious. Moderate conscious sedation allows you to return to your regular activities sooner. Tell a health care provider about:  Any allergies you have.  All medicines you are taking, including vitamins, herbs, eye drops, creams, and over-the-counter medicines.  Any use of steroids. This includes steroids taken by mouth or as a cream.  Any problems you or family members have had with sedatives and anesthetic medicines.  Any blood disorders you have.  Any surgeries you have had.  Any medical conditions you have, such as sleep apnea.  Whether you are pregnant or may be pregnant.  Any use of cigarettes, alcohol, marijuana, or drugs. What are the risks? Generally, this is a safe  procedure. However, problems may occur, including:  Getting too much medicine (oversedation).  Nausea.  Allergic reaction to medicines.  Trouble breathing. If this happens, a breathing tube may be used. It will be removed when you are awake and breathing on your own.  Heart trouble.  Lung trouble.  Confusion that gets better with time (emergence delirium). What happens before the procedure? Staying hydrated Follow instructions from your health care provider about hydration, which may include:  Up to 2 hours before the procedure - you may continue to drink clear liquids, such as water, clear fruit juice, black coffee, and plain tea. Eating and drinking restrictions Follow instructions from your health care provider about eating and drinking, which may include:  8 hours before the procedure - stop eating heavy meals or foods, such as meat, fried foods, or fatty foods.  6 hours before the procedure - stop eating light meals or foods, such as toast or cereal.  6 hours before the procedure - stop drinking milk or drinks that contain milk.  2 hours before the procedure - stop drinking clear liquids. Medicines Ask your health care provider about:  Changing or stopping your regular medicines. This is especially important if you are taking diabetes medicines or blood thinners.  Taking medicines such as aspirin and ibuprofen. These medicines can thin your blood. Do not take these medicines unless  your health care provider tells you to take them.  Taking over-the-counter medicines, vitamins, herbs, and supplements. Tests and exams  You will have a physical exam.  You may have blood tests done to show how well: ? Your kidneys and liver work. ? Your blood clots. General instructions  Plan to have a responsible adult take you home from the hospital or clinic.  If you will be going home right after the procedure, plan to have a responsible adult care for you for the time you are  told. This is important. What happens during the procedure?  You will be given the sedative. The sedative may be given: ? As a pill that you will swallow. It can also be inserted into the rectum. ? As a spray through the nose. ? As an injection into the muscle. ? As an injection into the vein through an IV.  You may be given oxygen as needed.  Your breathing, heart rate, and blood pressure will be monitored during the procedure.  The medical or dental procedure will be done. The procedure may vary among health care providers and hospitals.   What happens after the procedure?  Your blood pressure, heart rate, breathing rate, and blood oxygen level will be monitored until you leave the hospital or clinic.  You will get fluids through your IV if needed.  Do not drive or operate machinery until your health care provider says that it is safe. Summary  Sedation is the use of medicines to promote relaxation and to relieve discomfort and anxiety. Moderate conscious sedation is a type of sedation that is used during short medical and dental procedures.  Tell the health care provider about any medical conditions that you have and about all the medicines that you are taking.  You will be given the sedative as a pill, a spray through the nose, an injection into the muscle, or an injection into the vein through an IV. Vital signs are monitored during the sedation.  Moderate conscious sedation allows you to return to your regular activities sooner. This information is not intended to replace advice given to you by your health care provider. Make sure you discuss any questions you have with your health care provider. Document Revised: 06/07/2019 Document Reviewed: 01/03/2019 Elsevier Patient Education  2021 [employer].

## 2020-05-11 NOTE — H&P (Signed)
Chief Complaint: Patient was seen in consultation today for Cerebral arteriogram    Supervising Physician: Luanne Bras  Patient Status: [facility] - Out-pt  History of Present Illness: Monica Pugh is a 70 y.o. female   Known to NIR L MCA and L ICA revascularization 04/2015 Taking Plavix only daily LD yesterday Also taking Lipitor Pt is HOH-- no other complaints  Most recent Carotid US 04/30/20:   Right Carotid: Evidence consistent with a total occlusion of the right ICA. Left Carotid: Limited color and spectral Doppler flow observed within the common carotid and internal carotid arteries. Pre-occlusion thump noted. Vertebrals: Bilateral vertebral arteries demonstrate antegrade flow. Subclavians: Normal flow hemodynamics were seen in bilateral subclavian arteries.  Was seen by Dr Oneida Alar 04/30/20 in office ASSESSMENT/PLAN:: 70 y.o. female here for follow up for carotid artery stenosis.  She has no symptoms referable to carotid artery disease.  Her ultrasound shows likely stenosis of her indwelling left carotid stent.  I discussed these results with Dr. Oneida Alar.  Informed the patient and her husband that we will refer her back to Dr. Estanislado Pandy for evaluation.  Continue Plavix and statin.  She is scheduled now for Cerebral arteriogram and evaluation of Left ICA stent   Past Medical History:  Diagnosis Date  . Anemia   . Carotid artery occlusion   . GERD (gastroesophageal reflux disease)   . Hearing loss   . Hyperlipidemia   . Peripheral vascular disease ([place])   . Sleep apnea   . Stroke ([place])   . Vitamin B 12 deficiency   . Vitamin D deficiency     Past Surgical History:  Procedure Laterality Date  . RADIOLOGY WITH ANESTHESIA N/A 05/09/2015   Procedure: RADIOLOGY WITH ANESTHESIA;  Surgeon: Luanne Bras, MD;  Location: [place] [facility];  Service: Radiology;  Laterality: N/A;  . TUBAL LIGATION      Allergies: Patient has no known allergies.  Medications: Prior to Admission  medications   Medication Sig Start Date End Date Taking? Authorizing Provider  atorvastatin (LIPITOR) 40 MG tablet Take 40 mg by mouth daily.    Yes [provider]  Cholecalciferol (DIALYVITE VITAMIN D 5000) 125 MCG (5000 UT) capsule Take 5,000 Units by mouth daily.   Yes [provider]  clopidogrel (PLAVIX) 75 MG tablet Take 1 tablet (75 mg total) by mouth daily with breakfast. 05/21/15  Yes Angiulli, Lavon Paganini, PA-C  Cyanocobalamin (B-12) 2500 MCG TABS Take 2,500 mcg by mouth daily.   Yes [provider]  pantoprazole (PROTONIX) 40 MG tablet Take 1 tablet (40 mg total) by mouth 2 (two) times daily. Patient taking differently: Take 40 mg by mouth daily. 05/21/15  Yes Angiulli, Lavon Paganini, PA-C     Family History  Problem Relation Age of Onset  . Cancer Mother   . Heart attack Father     Social History   Socioeconomic History  . Marital status: Married    Spouse name: Not on file  . Number of children: Not on file  . Years of education: Not on file  . Highest education level: Not on file  Occupational History  . Not on file  Tobacco Use  . Smoking status: Current Every Day Smoker    Packs/day: 1.50    Years: 40.00    Pack years: 60.00    Types: Cigarettes  . Smokeless tobacco: Never Used  Vaping Use  . Vaping Use: Never used  Substance and Sexual Activity  . Alcohol use: Not Currently  Alcohol/week: 0.0 standard drinks  . Drug use: Never  . Sexual activity: Not on file  Other Topics Concern  . Not on file  Social History Narrative  . Not on file   Social Determinants of Health   Financial Resource Strain: Not on file  Food Insecurity: Not on file  Transportation Needs: Not on file  Physical Activity: Not on file  Stress: Not on file  Social Connections: Not on file    Review of Systems: A 12 point ROS discussed and pertinent positives are indicated in the HPI above.  All other systems are negative.  Review of Systems  Constitutional:  Negative for activity change, appetite change, fatigue and fever.  HENT: Positive for hearing loss. Negative for tinnitus and trouble swallowing.   Eyes: Negative for visual disturbance.  Respiratory: Negative for cough and shortness of breath.   Cardiovascular: Negative for chest pain.  Gastrointestinal: Negative for abdominal pain, nausea and vomiting.  Musculoskeletal: Negative for back pain and gait problem.  Neurological: Negative for dizziness, tremors, seizures, syncope, facial asymmetry, speech difficulty, weakness, light-headedness, numbness and headaches.  Psychiatric/Behavioral: Negative for behavioral problems and confusion.    Vital Signs: BP (!) 155/60   Pulse 73   Temp 97.9 F (36.6 C) (Oral)   Resp 16   Ht 5' 3.5" (1.613 m)   Wt 185 lb (83.9 kg)   SpO2 95%   BMI 32.26 kg/m   Physical Exam Vitals reviewed.  Eyes:     Extraocular Movements: Extraocular movements intact.  Cardiovascular:     Rate and Rhythm: Normal rate and regular rhythm.     Heart sounds: Normal heart sounds.  Pulmonary:     Effort: Pulmonary effort is normal.     Breath sounds: Normal breath sounds.  Abdominal:     Palpations: Abdomen is soft.  Musculoskeletal:        General: Normal range of motion.     Right lower leg: No edema.     Left lower leg: No edema.  Skin:    General: Skin is warm.  Neurological:     Mental Status: She is alert and oriented to person, place, and time.  Psychiatric:        Behavior: Behavior normal.     Imaging: VAS US CAROTID  Result Date: 04/30/2020 Carotid Arterial Duplex Study Indications:       Carotid artery disease.                     Left carotid stent 2017 by Dr. Estanislado Pandy Risk Factors:      Current smoker. Limitations        Today's exam was limited due to the patient's respiratory                    variation. Comparison Study:  Duplex 03/28/2019 R=occlusion, L=80-99% (distal stent)                    CTA 04/08/2019 no significant stenosis of the  stent Performing Technologist: Ronal Fear RVS, RCS  Examination Guidelines: A complete evaluation includes B-mode imaging, spectral Doppler, color Doppler, and power Doppler as needed of all accessible portions of each vessel. Bilateral testing is considered an integral part of a complete examination. Limited examinations for reoccurring indications may be performed as noted.  Right Carotid Findings: +----------+--------+--------+--------+------------------+-------------------+           PSV cm/sEDV cm/sStenosisPlaque DescriptionComments            +----------+--------+--------+--------+------------------+-------------------+  CCA Prox  19              Occluded                                      +----------+--------+--------+--------+------------------+-------------------+ CCA Mid                   Occluded                                      +----------+--------+--------+--------+------------------+-------------------+ CCA Distal                Occluded                                      +----------+--------+--------+--------+------------------+-------------------+ ICA Prox                  Occluded                                      +----------+--------+--------+--------+------------------+-------------------+ ICA Mid                   Occluded                                      +----------+--------+--------+--------+------------------+-------------------+ ICA Distal                Occluded                                      +----------+--------+--------+--------+------------------+-------------------+ ECA       47      10                                occluded proximally +----------+--------+--------+--------+------------------+-------------------+ +----------+--------+-------+----------------+-------------------+           PSV cm/sEDV cmsDescribe        Arm Pressure (mmHG)  +----------+--------+-------+----------------+-------------------+ [id number]            Multiphasic, WNL                    +----------+--------+-------+----------------+-------------------+ +---------+--------+---+--------+--+---------+ VertebralPSV cm/s125EDV cm/s35Antegrade +---------+--------+---+--------+--+---------+  Left Carotid Findings: +----------+--------+--------+--------+------------------+---------------------+           PSV cm/sEDV cm/sStenosisPlaque DescriptionComments              +----------+--------+--------+--------+------------------+---------------------+ CCA Prox  41                                        pre-occlusive         +----------+--------+--------+--------+------------------+---------------------+ CCA Mid   15                                        (stent) pre-occlusive +----------+--------+--------+--------+------------------+---------------------+ CCA Distal11  calcific          (stent) pre-occlusive +----------+--------+--------+--------+------------------+---------------------+ ICA Prox  12                      calcific          (stent) pre-occlusive +----------+--------+--------+--------+------------------+---------------------+ ICA Mid   10                                        pre-occlusive         +----------+--------+--------+--------+------------------+---------------------+ ICA Distal0                                         no-flow observed      +----------+--------+--------+--------+------------------+---------------------+ ECA       25      6                                                       +----------+--------+--------+--------+------------------+---------------------+ +----------+--------+--------+----------------+-------------------+           PSV cm/sEDV cm/sDescribe        Arm Pressure (mmHG) +----------+--------+--------+----------------+-------------------+  [id number]             Multiphasic, WNL                    +----------+--------+--------+----------------+-------------------+ +---------+--------+---+--------+--+---------+ VertebralPSV cm/s174EDV cm/s50Antegrade +---------+--------+---+--------+--+---------+   Summary: Right Carotid: Evidence consistent with a total occlusion of the right ICA. Left Carotid: Limited color and spectral Doppler flow observed within the common               carotid and internal carotid arteries. Pre-occlusion thump noted. Vertebrals:  Bilateral vertebral arteries demonstrate antegrade flow. Subclavians: Normal flow hemodynamics were seen in bilateral subclavian              arteries. *See table(s) above for measurements and observations.  Electronically signed by Ruta Hinds MD on 04/30/2020 at 4:01:25 PM.    Final     Labs:  CBC: Recent Labs    05/11/20 0645  WBC 9.7  HGB 15.9*  HCT 48.6*  PLT 226    COAGS: No results for input(s): INR, APTT in the last 8760 hours.  BMP: No results for input(s): NA, K, CL, CO2, GLUCOSE, BUN, CALCIUM, CREATININE, GFRNONAA, GFRAA in the last 8760 hours.  Invalid input(s): CMP  LIVER FUNCTION TESTS: No results for input(s): BILITOT, AST, ALT, ALKPHOS, PROT, ALBUMIN in the last 8760 hours.  TUMOR MARKERS: No results for input(s): AFPTM, CEA, CA199, CHROMGRNA in the last 8760 hours.  Assessment and Plan:  Previous CVA 04/2015 Left MCA and L ICA revascularization with Dr Estanislado Pandy Plavix and Lipitor daily No Neuro cpmplaints [place] 04/30/20 revealing L ICA stenosis Scheduled for Cerebral arteriogram for evaluation today Risks and benefits of cerebral angiogram with intervention were discussed with the patient including, but not limited to bleeding, infection, vascular injury, contrast induced renal failure, stroke or even death.  This interventional procedure involves the use of X-rays and because of the nature of the planned procedure, it is possible that  we will have prolonged use of X-ray fluoroscopy.  Potential radiation risks to you include (but  are not limited to) the following: - A slightly elevated risk for cancer  several years later in life. This risk is typically less than 0.5% percent. This risk is low in comparison to the normal incidence of human cancer, which is 33% for women and 50% for men according to the [place]. - Radiation induced injury can include skin redness, resembling a rash, tissue breakdown / ulcers and hair loss (which can be temporary or permanent).   The likelihood of either of these occurring depends on the difficulty of the procedure and whether you are sensitive to radiation due to previous procedures, disease, or genetic conditions.   IF your procedure requires a prolonged use of radiation, you will be notified and given written instructions for further action.  It is your responsibility to monitor the irradiated area for the 2 weeks following the procedure and to notify your physician if you are concerned that you have suffered a radiation induced injury.    All of the patient's questions were answered, patient is agreeable to proceed. Consent signed and in chart.  Thank you for this interesting consult.  I greatly enjoyed meeting Ramiah Helfrich and look forward to participating in their care.  A copy of this report was sent to the requesting provider on this date.  Electronically Signed: Lavonia Drafts, PA-C 05/11/2020, 7:55 AM   I spent a total of    25 Minutes in face to face in clinical consultation, greater than 50% of which was counseling/coordinating care for cerebral arteriogram

## 2020-05-13 ENCOUNTER — Telehealth (HOSPITAL_COMMUNITY): Payer: Self-pay

## 2020-05-13 ENCOUNTER — Other Ambulatory Visit (HOSPITAL_COMMUNITY): Payer: Self-pay | Admitting: Interventional Radiology

## 2020-05-13 DIAGNOSIS — I6523 Occlusion and stenosis of bilateral carotid arteries: Secondary | ICD-10-CM

## 2020-05-13 NOTE — Telephone Encounter (Signed)
Called to schedule consult, no answer, no vm. AW  

## 2020-05-27 ENCOUNTER — Other Ambulatory Visit: Payer: Self-pay

## 2020-05-27 ENCOUNTER — Ambulatory Visit (HOSPITAL_COMMUNITY)
Admission: RE | Admit: 2020-05-27 | Discharge: 2020-05-27 | Disposition: A | Discharge status: Home / Self Care | Payer: Medicare HMO | Source: Ambulatory Visit | Attending: Interventional Radiology | Admitting: Interventional Radiology

## 2020-05-27 DIAGNOSIS — I6523 Occlusion and stenosis of bilateral carotid arteries: Secondary | ICD-10-CM

## 2020-05-28 HISTORY — PX: IR RADIOLOGIST EVAL & MGMT: IMG5224

## 2020-06-01 ENCOUNTER — Other Ambulatory Visit (HOSPITAL_COMMUNITY): Payer: Self-pay | Admitting: Interventional Radiology

## 2020-06-01 DIAGNOSIS — I639 Cerebral infarction, unspecified: Secondary | ICD-10-CM

## 2020-06-04 ENCOUNTER — Other Ambulatory Visit: Payer: Self-pay | Admitting: Radiology

## 2020-06-05 ENCOUNTER — Other Ambulatory Visit: Payer: Self-pay | Admitting: Radiology

## 2020-06-05 ENCOUNTER — Encounter (HOSPITAL_COMMUNITY): Payer: Self-pay | Admitting: Interventional Radiology

## 2020-06-05 ENCOUNTER — Other Ambulatory Visit (HOSPITAL_COMMUNITY): Payer: Self-pay

## 2020-06-05 ENCOUNTER — Other Ambulatory Visit (HOSPITAL_COMMUNITY)
Admission: RE | Admit: 2020-06-05 | Discharge: 2020-06-05 | Disposition: A | Discharge status: Home / Self Care | Payer: Medicare HMO | Source: Ambulatory Visit | Attending: Interventional Radiology | Admitting: Interventional Radiology

## 2020-06-05 ENCOUNTER — Other Ambulatory Visit: Payer: Self-pay

## 2020-06-05 DIAGNOSIS — Z20822 Contact with and (suspected) exposure to covid-19: Secondary | ICD-10-CM | POA: Insufficient documentation

## 2020-06-05 DIAGNOSIS — Z01812 Encounter for preprocedural laboratory examination: Secondary | ICD-10-CM | POA: Insufficient documentation

## 2020-06-05 DIAGNOSIS — Z7902 Long term (current) use of antithrombotics/antiplatelets: Secondary | ICD-10-CM | POA: Diagnosis not present

## 2020-06-05 DIAGNOSIS — I639 Cerebral infarction, unspecified: Secondary | ICD-10-CM

## 2020-06-05 DIAGNOSIS — F17210 Nicotine dependence, cigarettes, uncomplicated: Secondary | ICD-10-CM | POA: Diagnosis not present

## 2020-06-05 DIAGNOSIS — I63232 Cerebral infarction due to unspecified occlusion or stenosis of left carotid arteries: Secondary | ICD-10-CM | POA: Diagnosis not present

## 2020-06-05 DIAGNOSIS — Z79899 Other long term (current) drug therapy: Secondary | ICD-10-CM | POA: Diagnosis not present

## 2020-06-05 DIAGNOSIS — Z7982 Long term (current) use of aspirin: Secondary | ICD-10-CM | POA: Diagnosis not present

## 2020-06-05 LAB — PLATELET INHIBITION P2Y12: Platelet Function  P2Y12: 5 [PRU] — ABNORMAL LOW (ref 182–335)

## 2020-06-05 LAB — SARS CORONAVIRUS 2 (TAT 6-24 HRS): SARS Coronavirus 2: NEGATIVE

## 2020-06-05 NOTE — Anesthesia Preprocedure Evaluation (Addendum)
Anesthesia Evaluation  Patient identified by MRN, date of birth, ID band Patient awake    Reviewed: Allergy & Precautions, NPO status , Patient's Chart, lab work & pertinent test results  Airway Mallampati: III  TM Distance: >3 FB Neck ROM: Full    Dental  (+) Edentulous Upper, Upper Dentures, Missing, Partial Lower   Pulmonary sleep apnea , pneumonia, Current Smoker and Patient abstained from smoking.,    Pulmonary exam normal breath sounds clear to auscultation       Cardiovascular hypertension, + Peripheral Vascular Disease   Rhythm:Regular Rate:Normal + Systolic murmurs Echo 0174 - Left ventricle: The cavity size was normal. Wall thickness wasincreased in a pattern of mild LVH. Systolic function was normal. The estimated ejection fraction was in the range of 55% to 60%.Wall motion was normal; there were no regional wall motionabnormalities. Doppler parameters are consistent with abnormalleft ventricular relaxation (grade 1 diastolic dysfunction).  - Left atrium: The atrium was mildly dilated.  - Pulmonary arteries: Systolic pressure was mildly increased. PA peak pressure: 34 mm Hg (S).   Impressions:  - Normal LV systolic function; mild LVH; grade 1 diastolic  dysfunction; mild LAE; trace TR with mildly elevated pulmonarypressure.    Neuro/Psych CVA    GI/Hepatic Neg liver ROS, GERD  ,  Endo/Other  diabetesHypothyroidism   Renal/GU negative Renal ROS     Musculoskeletal negative musculoskeletal ROS (+)   Abdominal   Peds  Hematology  (+) Blood dyscrasia, anemia ,   Anesthesia Other Findings   Reproductive/Obstetrics                                                            Anesthesia Evaluation  Patient identified by MRN, date of birth, ID band Patient unresponsive  Preop documentation limited or incomplete due to emergent nature of procedure.  Airway Mallampati:  Intubated       Dental   Pulmonary    breath sounds clear to auscultation       Cardiovascular  Rhythm:Regular Rate:Normal     Neuro/Psych    GI/Hepatic GERD  Medicated,  Endo/Other    Renal/GU      Musculoskeletal   Abdominal   Peds  Hematology   Anesthesia Other Findings Pt brought to the Neuro radiology suite intubated.  Reproductive/Obstetrics                             Anesthesia Physical Anesthesia Plan  ASA: IV  Anesthesia Plan: General   Post-op Pain Management:    Induction: Intravenous  Airway Management Planned: Oral ETT  Additional Equipment: Arterial line  Intra-op Plan:   Post-operative Plan: Post-operative intubation/ventilation  Informed Consent: I have reviewed the patients History and Physical, chart, labs and discussed the procedure including the risks, benefits and alternatives for the proposed anesthesia with the patient or authorized representative who has indicated his/her understanding and acceptance.     Plan Discussed with: CRNA, Anesthesiologist and Surgeon  Anesthesia Plan Comments: (Pt family unavailable. Will proceed with anesthesia plan for emergent intervention.)        Anesthesia Quick Evaluation  Anesthesia Physical Anesthesia Plan  ASA: III  Anesthesia Plan: MAC   Post-op Pain Management:    Induction: Intravenous  PONV Risk Score and Plan:  1 and Ondansetron, Treatment may vary due to age or medical condition and Dexamethasone  Airway Management Planned: Natural Airway  Additional Equipment:   Intra-op Plan:   Post-operative Plan:   Informed Consent: I have reviewed the patients History and Physical, chart, labs and discussed the procedure including the risks, benefits and alternatives for the proposed anesthesia with the patient or authorized representative who has indicated his/her understanding and acceptance.     Dental advisory given  Plan Discussed  with: CRNA  Anesthesia Plan Comments: (Dr. Estanislado Pandy requested MAC and no arterial line.  PAT note written 06/05/2020 by Myra Gianotti, PA-C. )     Anesthesia Quick Evaluation

## 2020-06-05 NOTE — Progress Notes (Signed)
PCP - Irven Shelling, FNP-C   Cardiologist - n/a  Chest x-ray - n/a EKG - 05/09/15, 12/19/17 CE Stress Test - n/a ECHO - 05/10/15 Cardiac Cath - n/a  Sleep Study -  Yes CPAP - does not use cpap  Anesthesia review: Yes  STOP now taking any Aspirin (unless otherwise instructed by your surgeon), Aleve, Naproxen, Ibuprofen, Motrin, Advil, Goody's, BC's, all herbal medications, fish oil, and all vitamins.   Coronavirus Screening Covid test is scheduled for 06/05/20. Do you have any of the following symptoms:  Cough yes/no: No Fever (>100.32F)  yes/no: No Runny nose yes/no: No Sore throat yes/no: No Difficulty breathing/shortness of breath  yes/no: No  Have you traveled in the last 14 days and where? yes/no: No  Patient verbalized understanding of instructions that were given via phone.

## 2020-06-05 NOTE — Progress Notes (Signed)
Anesthesia Chart Review: Monica Pugh   Case: [id number] Date/Time: 06/08/20 0815   Procedure: IR WITH ANESTHESIA ANGIOPLASTY (N/A )   Anesthesia type: General   Pre-op diagnosis: STENOSIS   Location: [facility] OR RADIOLOGY ROOM / [facility]   Surgeons: Luanne Bras, MD      DISCUSSION: Patient is a 70 year old female scheduled for cerebral arteriogram with possible left internal carotid artery angioplasty and stenting by Dr. Estanislado Pandy on 06/08/20. S/p left ICA stent-assisted angioplasty 05/09/15 in setting of acute CVA. Recent arteriogram showed occluded stented segment.  History includes smoking, CVA (acute left MCA infarct with occlusion bilateral proximal ICAs 05/09/15, s/p tPA with left MCA and LICA revascularization with proximal left ICA stent 05/09/15, post procedure left SAH), carotid artery stenosis (bilateral proximal ICA occlusion, s/p stent assisted angioplasty left ICA 05/09/15 in setting of acute CVA), HLD, PVD, OSA, anemia, hearing loss, GERD.   Last EKG noted is > 1 year ago. She is a same day work-up, so she is for labs and anesthesia team evaluation on the day of procedure. Medication list includes ASA 81 mg and Plavix 75 mg daily.   Preprocedure COVID-19 test is scheduled for 06/05/20. Anesthesia team to evaluate on the day of procedure.   VS:  BP Readings from Last 3 Encounters:  05/11/20 (!) 143/64  04/30/20 (!) 156/78  05/07/19 (!) 138/52   Pulse Readings from Last 3 Encounters:  05/11/20 77  04/30/20 75  05/07/19 66    PROVIDERS: [place], Rosalyn Charters, MD is listed as PCP  - Ruta Hinds, MD is vascular surgeon. Last evaluation 04/30/20 by Risa Grill, PA-C. Patient referred back to IR given US showed likely stenosis of her previously placed left carotid stent. - Izora Gala, MD is ENT. 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   LABS: For day of procedure. As of 05/11/20, lab results include: Lab Results  Component Value Date   WBC 9.7 05/11/2020   HGB 15.9 (H) 05/11/2020   HCT 48.6 (H) 05/11/2020   PLT 226 05/11/2020   GLUCOSE 94 05/11/2020   NA 138 05/11/2020   K 4.0 05/11/2020   CL 103 05/11/2020   CREATININE 0.88 05/11/2020   BUN 6 (L) 05/11/2020   CO2 29 05/11/2020   INR 1.0 05/11/2020     IMAGES: Cerebral arteriogram 05/11/20:  IMPRESSION: - Angiographic occlusion of the left internal carotid artery stented segment without evidence of a delayed string sign in the cervical left internal carotid artery. - Angiographically occluded right common carotid artery in its mid third extending into the internal carotid artery in the cervical region. Again without evidence of an angiographic string sign. - Prompt opacification of the distal internal carotid arteries in the supraclinoid regions retrogradely via the posterior communicating arteries from the vertebral arteries, with subsequent opacification of the middle cerebral arteries and the anterior cerebral arteries bilaterally. PLAN: Consultation to discuss management of the recently occluded left internal carotid artery stented segment.   EKG: Last EKG noted are from 05/09/15 and 12/19/17 ([facility]).   CV: Carotid US 04/30/20: Summary:  - Right Carotid: Evidence consistent with a total occlusion of the right ICA.  - Left Carotid: Limited color and spectral Doppler flow observed within the common carotid and internal carotid arteries. Pre-occlusion thump noted.  - Vertebrals: Bilateral  vertebral arteries demonstrate antegrade flow.  - Subclavians: Normal flow hemodynamics were seen in bilateral subclavian arteries.    Echo 05/10/15: Study Conclusions  - Left ventricle: The cavity size was normal. Wall thickness was  increased in a pattern of mild LVH.  Systolic function was normal.  The estimated ejection fraction was in the range of 55% to 60%.  Wall motion was normal; there were no regional wall motion  abnormalities. Doppler parameters are consistent with abnormal  left ventricular relaxation (grade 1 diastolic dysfunction).  - Left atrium: The atrium was mildly dilated.  - Pulmonary arteries: Systolic pressure was mildly increased. PA  peak pressure: 34 mm Hg (S).  Impressions:  - Normal LV systolic function; mild LVH; grade 1 diastolic  dysfunction; mild LAE; trace TR with mildly elevated pulmonary  pressure.    Past Medical History:  Diagnosis Date  . Anemia   . Carotid artery occlusion   . GERD (gastroesophageal reflux disease)   . Hearing loss   . Hyperlipidemia   . Peripheral vascular disease ([place])   . Sleep apnea   . Stroke ([place])   . Vitamin B 12 deficiency   . Vitamin D deficiency     Past Surgical History:  Procedure Laterality Date  . IR ANGIO INTRA EXTRACRAN SEL COM CAROTID INNOMINATE UNI R MOD SED  05/11/2020  . IR ANGIO INTRA EXTRACRAN SEL INTERNAL CAROTID UNI L MOD SED  05/11/2020  . IR ANGIO VERTEBRAL SEL SUBCLAVIAN INNOMINATE BILAT MOD SED  05/11/2020  . IR RADIOLOGIST EVAL & MGMT  05/28/2020  . IR US GUIDE VASC ACCESS RIGHT  05/11/2020  . RADIOLOGY WITH ANESTHESIA N/A 05/09/2015   Procedure: RADIOLOGY WITH ANESTHESIA;  Surgeon: Luanne Bras, MD;  Location: [facility];  Service: Radiology;  Laterality: N/A;  . TUBAL LIGATION      MEDICATIONS: No current facility-administered medications for this encounter.   Marland Kitchen acetaminophen (TYLENOL) 500 MG tablet  . aspirin EC 81 MG tablet  . atorvastatin (LIPITOR) 40 MG tablet  . Cholecalciferol (DIALYVITE VITAMIN D 5000) 125 MCG (5000 UT) capsule  . clopidogrel (PLAVIX) 75 MG tablet  . pantoprazole (PROTONIX) 40 MG tablet    Myra Gianotti, PA-C Surgical Short Stay/Anesthesiology [facility] Phone [phone] [facility] Phone [phone] 06/05/2020 11:14  AM

## 2020-06-08 ENCOUNTER — Other Ambulatory Visit (HOSPITAL_COMMUNITY): Payer: Self-pay | Admitting: Interventional Radiology

## 2020-06-08 ENCOUNTER — Ambulatory Visit (HOSPITAL_COMMUNITY)
Admission: RE | Admit: 2020-06-08 | Discharge: 2020-06-08 | Disposition: A | Discharge status: Home / Self Care | Payer: Medicare HMO | Source: Ambulatory Visit | Attending: Interventional Radiology | Admitting: Interventional Radiology

## 2020-06-08 ENCOUNTER — Encounter (HOSPITAL_COMMUNITY): Payer: Self-pay

## 2020-06-08 ENCOUNTER — Other Ambulatory Visit: Payer: Self-pay

## 2020-06-08 ENCOUNTER — Encounter (HOSPITAL_COMMUNITY): Payer: Self-pay | Admitting: Interventional Radiology

## 2020-06-08 ENCOUNTER — Encounter (HOSPITAL_COMMUNITY)
Admission: RE | Disposition: A | Discharge status: Home / Self Care | Payer: Self-pay | Source: Home / Self Care | Attending: Interventional Radiology

## 2020-06-08 ENCOUNTER — Inpatient Hospital Stay (HOSPITAL_COMMUNITY): Payer: Medicare HMO | Admitting: Vascular Surgery

## 2020-06-08 ENCOUNTER — Ambulatory Visit (HOSPITAL_COMMUNITY)
Admission: RE | Admit: 2020-06-08 | Discharge: 2020-06-08 | Disposition: A | Discharge status: Home / Self Care | Payer: Medicare HMO | Attending: Interventional Radiology | Admitting: Interventional Radiology

## 2020-06-08 DIAGNOSIS — Z7902 Long term (current) use of antithrombotics/antiplatelets: Secondary | ICD-10-CM | POA: Insufficient documentation

## 2020-06-08 DIAGNOSIS — F17210 Nicotine dependence, cigarettes, uncomplicated: Secondary | ICD-10-CM | POA: Insufficient documentation

## 2020-06-08 DIAGNOSIS — Z20822 Contact with and (suspected) exposure to covid-19: Secondary | ICD-10-CM | POA: Insufficient documentation

## 2020-06-08 DIAGNOSIS — I639 Cerebral infarction, unspecified: Secondary | ICD-10-CM | POA: Insufficient documentation

## 2020-06-08 DIAGNOSIS — I771 Stricture of artery: Secondary | ICD-10-CM

## 2020-06-08 DIAGNOSIS — Z79899 Other long term (current) drug therapy: Secondary | ICD-10-CM | POA: Insufficient documentation

## 2020-06-08 DIAGNOSIS — Z7982 Long term (current) use of aspirin: Secondary | ICD-10-CM | POA: Diagnosis not present

## 2020-06-08 DIAGNOSIS — I6522 Occlusion and stenosis of left carotid artery: Secondary | ICD-10-CM | POA: Insufficient documentation

## 2020-06-08 DIAGNOSIS — I63232 Cerebral infarction due to unspecified occlusion or stenosis of left carotid arteries: Secondary | ICD-10-CM | POA: Diagnosis not present

## 2020-06-08 HISTORY — PX: IR ANGIOGRAM EXTREMITY BILATERAL: IMG653

## 2020-06-08 HISTORY — PX: IR ANGIO INTRA EXTRACRAN SEL INTERNAL CAROTID UNI L MOD SED: IMG5361

## 2020-06-08 HISTORY — PX: IR US GUIDE VASC ACCESS RIGHT: IMG2390

## 2020-06-08 HISTORY — PX: RADIOLOGY WITH ANESTHESIA: SHX6223

## 2020-06-08 LAB — CBC WITH DIFFERENTIAL/PLATELET
Abs Immature Granulocytes: 0.03 10*3/uL (ref 0.00–0.07)
Basophils Absolute: 0.1 10*3/uL (ref 0.0–0.1)
Basophils Relative: 1 %
Eosinophils Absolute: 0.1 10*3/uL (ref 0.0–0.5)
Eosinophils Relative: 2 %
HCT: 46 % (ref 36.0–46.0)
Hemoglobin: 15 g/dL (ref 12.0–15.0)
Immature Granulocytes: 0 %
Lymphocytes Relative: 30 %
Lymphs Abs: 2.6 10*3/uL (ref 0.7–4.0)
MCH: 31.2 pg (ref 26.0–34.0)
MCHC: 32.6 g/dL (ref 30.0–36.0)
MCV: 95.6 fL (ref 80.0–100.0)
Monocytes Absolute: 0.7 10*3/uL (ref 0.1–1.0)
Monocytes Relative: 9 %
Neutro Abs: 5.1 10*3/uL (ref 1.7–7.7)
Neutrophils Relative %: 58 %
Platelets: 263 10*3/uL (ref 150–400)
RBC: 4.81 MIL/uL (ref 3.87–5.11)
RDW: 14.3 % (ref 11.5–15.5)
WBC: 8.6 10*3/uL (ref 4.0–10.5)
nRBC: 0 % (ref 0.0–0.2)

## 2020-06-08 LAB — POCT ACTIVATED CLOTTING TIME: Activated Clotting Time: 208 seconds

## 2020-06-08 LAB — BASIC METABOLIC PANEL
Anion gap: 8 (ref 5–15)
BUN: 7 mg/dL — ABNORMAL LOW (ref 8–23)
CO2: 27 mmol/L (ref 22–32)
Calcium: 9.2 mg/dL (ref 8.9–10.3)
Chloride: 105 mmol/L (ref 98–111)
Creatinine, Ser: 0.88 mg/dL (ref 0.44–1.00)
GFR, Estimated: >60 mL/min (ref >60)
Glucose, Bld: 97 mg/dL (ref 70–99)
Potassium: 3.9 mmol/L (ref 3.5–5.1)
Sodium: 140 mmol/L (ref 135–145)

## 2020-06-08 LAB — PLATELET INHIBITION P2Y12: Platelet Function  P2Y12: 8 [PRU] — ABNORMAL LOW (ref 182–335)

## 2020-06-08 LAB — PROTIME-INR
INR: 1 (ref 0.8–1.2)
Prothrombin Time: 13.3 seconds (ref 11.4–15.2)

## 2020-06-08 SURGERY — IR WITH ANESTHESIA
Anesthesia: General

## 2020-06-08 MED ORDER — SODIUM CHLORIDE 0.9 % IV SOLN: INTRAVENOUS | Status: DC

## 2020-06-08 MED ORDER — CLOPIDOGREL BISULFATE 75 MG PO TABS
75.0000 mg | ORAL_TABLET | ORAL | Status: DC
  Filled 2020-06-08: qty 1

## 2020-06-08 MED ORDER — MIDAZOLAM HCL 5 MG/5ML IJ SOLN
INTRAMUSCULAR | Status: DC | PRN
  Administered 2020-06-08 (×2): .5 mg via INTRAVENOUS

## 2020-06-08 MED ORDER — ASPIRIN EC 325 MG PO TBEC: 325.0000 mg | DELAYED_RELEASE_TABLET | ORAL | Status: DC

## 2020-06-08 MED ORDER — DROPERIDOL 2.5 MG/ML IJ SOLN: 0.6250 mg | Freq: Once | INTRAMUSCULAR | Status: DC | PRN

## 2020-06-08 MED ORDER — IOHEXOL 300 MG/ML  SOLN
150.0000 mL | Freq: Once | INTRAMUSCULAR | Status: AC | PRN
  Administered 2020-06-08: 66 mL via INTRA_ARTERIAL

## 2020-06-08 MED ORDER — CEFAZOLIN SODIUM-DEXTROSE 2-4 GM/100ML-% IV SOLN
INTRAVENOUS | Status: AC
  Filled 2020-06-08: qty 100

## 2020-06-08 MED ORDER — ORAL CARE MOUTH RINSE: 15.0000 mL | Freq: Once | OROMUCOSAL | Status: AC

## 2020-06-08 MED ORDER — HEPARIN SODIUM (PORCINE) 1000 UNIT/ML IJ SOLN
INTRAMUSCULAR | Status: DC | PRN
  Administered 2020-06-08: 3000 [IU] via INTRAVENOUS

## 2020-06-08 MED ORDER — FENTANYL CITRATE (PF) 100 MCG/2ML IJ SOLN: 25.0000 ug | INTRAMUSCULAR | Status: DC | PRN

## 2020-06-08 MED ORDER — LIDOCAINE HCL (PF) 1 % IJ SOLN
INTRAMUSCULAR | Status: AC | PRN
  Administered 2020-06-08: 5 mL

## 2020-06-08 MED ORDER — ASPIRIN EC 81 MG PO TBEC
81.0000 mg | DELAYED_RELEASE_TABLET | Freq: Every day | ORAL | Status: DC
  Administered 2020-06-08: 81 mg via ORAL

## 2020-06-08 MED ORDER — LIDOCAINE HCL 1 % IJ SOLN
INTRAMUSCULAR | Status: AC
  Filled 2020-06-08: qty 20

## 2020-06-08 MED ORDER — ASPIRIN EC 81 MG PO TBEC
DELAYED_RELEASE_TABLET | ORAL | Status: AC
  Administered 2020-06-08: 81 mg via ORAL
  Filled 2020-06-08: qty 3

## 2020-06-08 MED ORDER — LACTATED RINGERS IV SOLN: INTRAVENOUS | Status: DC

## 2020-06-08 MED ORDER — SODIUM CHLORIDE 0.9 % IV SOLN: INTRAVENOUS | Status: DC | PRN

## 2020-06-08 MED ORDER — CEFAZOLIN SODIUM-DEXTROSE 2-4 GM/100ML-% IV SOLN
2.0000 g | INTRAVENOUS | Status: AC
  Administered 2020-06-08: 2 g via INTRAVENOUS
  Filled 2020-06-08: qty 100

## 2020-06-08 MED ORDER — NITROGLYCERIN 1 MG/10 ML FOR IR/CATH LAB
INTRA_ARTERIAL | Status: AC
  Filled 2020-06-08: qty 10

## 2020-06-08 MED ORDER — CHLORHEXIDINE GLUCONATE 0.12 % MT SOLN
15.0000 mL | Freq: Once | OROMUCOSAL | Status: AC
  Administered 2020-06-08: 15 mL via OROMUCOSAL
  Filled 2020-06-08: qty 15

## 2020-06-08 MED ORDER — NIMODIPINE 30 MG PO CAPS
0.0000 mg | ORAL_CAPSULE | ORAL | Status: DC
Start: 2020-06-08 — End: 2020-06-08
  Filled 2020-06-08: qty 2

## 2020-06-08 MED ORDER — FENTANYL CITRATE (PF) 250 MCG/5ML IJ SOLN
INTRAMUSCULAR | Status: DC | PRN
  Administered 2020-06-08 (×2): 25 ug via INTRAVENOUS

## 2020-06-08 MED ORDER — LIDOCAINE 2% (20 MG/ML) 5 ML SYRINGE
INTRAMUSCULAR | Status: DC | PRN
  Administered 2020-06-08: 40 mg via INTRAVENOUS

## 2020-06-08 NOTE — Anesthesia Procedure Notes (Signed)
Procedure Name: MAC Date/Time: 06/08/2020 9:20 AM Performed by: Mariea Clonts, CRNA Pre-anesthesia Checklist: Patient identified, Emergency Drugs available, Suction available, Patient being monitored and Timeout performed Patient Re-evaluated:Patient Re-evaluated prior to induction Oxygen Delivery Method: Simple face mask and Nasal cannula

## 2020-06-08 NOTE — Transfer of Care (Signed)
Immediate Anesthesia Transfer of Care Note  Patient: Monica Pugh  Procedure(s) Performed: IR WITH ANESTHESIA ANGIOPLASTY (N/A )  Patient Location: PACU  Anesthesia Type:MAC  Level of Consciousness: awake, alert  and oriented  Airway & Oxygen Therapy: Patient Spontanous Breathing  Post-op Assessment: Report given to RN, Post -op Vital signs reviewed and stable and Patient moving all extremities X 4  Post vital signs: Reviewed and stable  Last Vitals:  Vitals Value Taken Time  BP 156/47 06/08/20 1119  Temp    Pulse 63 06/08/20 1120  Resp 19 06/08/20 1120  SpO2 96 % 06/08/20 1120  Vitals shown include unvalidated device data.  Last Pain:  Vitals:   06/08/20 0742  TempSrc:   PainSc: 0-No pain         Complications: No complications documented.

## 2020-06-08 NOTE — Sedation Documentation (Signed)
Pt transported to PACU bay 8 via stretcher accompanied by RN and CRNA. Report given to PACU RN. Hand off completed. Pt's right groin site level 0 and lower extremity distal pulses present, audible via doppler

## 2020-06-08 NOTE — Sedation Documentation (Signed)
Right groin sheath pulled. Manual pressure applied

## 2020-06-08 NOTE — Procedures (Signed)
S/P Lt common arteriogram.  RT CFA approach  Findings.. 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 Severe Bilateral common iliac stenosis. Distal pulses  All dopplerable. Plan  Needs revascularization of common iliacs prior to complete revascularization of occluded  stented Lt ICA .  Continue with present medical regimen.. D/W patient and spouse. S/Jesly Hartmann MD

## 2020-06-08 NOTE — Sedation Documentation (Signed)
Procedure started. Anesthesia case

## 2020-06-08 NOTE — H&P (Signed)
Chief Complaint: Patient was seen in consultation today for cerebral arteriogram with possible left internal carotid artery angioplasty/stent placement per Dr Pershing Proud   Supervising Physician: Luanne Bras  Patient Status: [facility] - Out-pt  History of Present Illness: Monica Pugh is a 70 y.o. female   Known to NIR L MCA revascularization TICI 3 reperfusion And  L ICA angioplasty/stent revascularization--- 05/09/2015  Follows with Dr Estanislado Pandy Carotid US 04/30/20:  Right Carotid: Evidence consistent with a total occlusion of the right ICA. Left Carotid: Limited color and spectral Doppler flow observed within the common carotid and internal carotid arteries. Pre-occlusion thump noted. Vertebrals: Bilateral vertebral arteries demonstrate antegrade flow. Subclavians: Normal flow hemodynamics were seen in bilateral subclavian arteries.  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 Angiographically occluded right common carotid artery in its mid third extending into the internal carotid artery in the cervical region. Again without evidence of an angiographic string sign. Prompt opacification of the distal internal carotid arteries in the supraclinoid regions retrogradely via the posterior communicating arteries from the vertebral arteries, with subsequent opacification of the middle cerebral arteries and the anterior cerebral arteries Bilaterally.  Was seen in consultation with Dr Estanislado Pandy 05/27/20: Patient reports occasional episodes of bilateral visual blurring but without diplopia, or symptoms of amaurosis fugax, or complete blindness. She reports no other symptoms of near syncope, seizure-like activity, passing out spells, perioral numbness, incoordination, or with gait instability. No difficulty swallowing liquids or  solids.  The angiographic findings were reviewed with the patient and her husband. Options considered were those of continued surveillance with the patient strongly advised to stop smoking. The second option was to attempt at revascularization of the occluded left internal carotid artery at the bulb given the recent nature of the occlusion by ultrasound and diagnostic catheter arteriogram. The revascularization attempt was reviewed in detail. Success rate at revascularization would be 50-60%. Procedure would be under general anesthesia. Risk of 1-2% chance of a new stroke was also discussed.  She and husband have chosen to move ahead with revascularization of LICA She is here today for same  Taking ASA 81 and Plavix 75 mg daily   Past Medical History:  Diagnosis Date  . Anemia   . Carotid artery occlusion   . GERD (gastroesophageal reflux disease)   . Hearing loss    bilateral - no hearing aids  . Hyperlipidemia   . Peripheral vascular disease ([place])   . Sleep apnea    does not use cpap  . Stroke ([place])   . Vitamin B 12 deficiency   . Vitamin D deficiency     Past Surgical History:  Procedure Laterality Date  . COLONOSCOPY    . IR ANGIO INTRA EXTRACRAN SEL COM CAROTID INNOMINATE UNI R MOD SED  05/11/2020  . IR ANGIO INTRA EXTRACRAN SEL INTERNAL CAROTID UNI L MOD SED  05/11/2020  . IR ANGIO VERTEBRAL SEL SUBCLAVIAN INNOMINATE BILAT MOD SED  05/11/2020  . IR RADIOLOGIST EVAL & MGMT  05/28/2020  . IR US GUIDE VASC ACCESS RIGHT  05/11/2020  . RADIOLOGY WITH ANESTHESIA N/A 05/09/2015   Procedure: RADIOLOGY WITH ANESTHESIA;  Surgeon: Luanne Bras, MD;  Location: [facility];  Service: Radiology;  Laterality: N/A;  . TUBAL LIGATION      Allergies: Patient has no known allergies.  Medications: Prior to Admission medications   Medication Sig Start Date End  Date Taking? Authorizing Provider  acetaminophen (TYLENOL) 500 MG tablet Take 500 mg by mouth every 6 (six) hours as needed  for moderate pain or headache.   Yes [provider]  aspirin EC 81 MG tablet Take 81 mg by mouth daily. Swallow whole.   Yes [provider]  atorvastatin (LIPITOR) 40 MG tablet Take 40 mg by mouth daily.    Yes [provider]  Cholecalciferol (DIALYVITE VITAMIN D 5000) 125 MCG (5000 UT) capsule Take 5,000 Units by mouth daily.   Yes [provider]  clopidogrel (PLAVIX) 75 MG tablet Take 1 tablet (75 mg total) by mouth daily with breakfast. 05/21/15  Yes Angiulli, Lavon Paganini, PA-C  pantoprazole (PROTONIX) 40 MG tablet Take 1 tablet (40 mg total) by mouth 2 (two) times daily. Patient taking differently: Take 40 mg by mouth daily. 05/21/15  Yes Angiulli, Lavon Paganini, PA-C     Family History  Problem Relation Age of Onset  . Cancer Mother   . Heart attack Father     Social History   Socioeconomic History  . Marital status: Married    Spouse name: Not on file  . Number of children: Not on file  . Years of education: Not on file  . Highest education level: Not on file  Occupational History  . Not on file  Tobacco Use  . Smoking status: Current Every Day Smoker    Packs/day: 1.50    Years: 40.00    Pack years: 60.00    Types: Cigarettes  . Smokeless tobacco: Never Used  . Tobacco comment: 1-1 1/2 ppd  Vaping Use  . Vaping Use: Never used  Substance and Sexual Activity  . Alcohol use: Not Currently    Alcohol/week: 0.0 standard drinks  . Drug use: Never  . Sexual activity: Not on file  Other Topics Concern  . Not on file  Social History Narrative  . Not on file   Social Determinants of Health   Financial Resource Strain: Not on file  Food Insecurity: Not on file  Transportation Needs: Not on file  Physical Activity: Not on file  Stress: Not on file  Social Connections: Not on file     Review of Systems: A 12 point ROS discussed and pertinent positives are indicated in the HPI above.  All other systems are negative.  Review of Systems   Constitutional: Negative for activity change, fatigue and fever.  HENT: Positive for hearing loss. Negative for tinnitus and trouble swallowing.   Eyes: Negative for visual disturbance.  Respiratory: Negative for cough and shortness of breath.   Cardiovascular: Negative for chest pain.  Gastrointestinal: Negative for abdominal pain, diarrhea, nausea and vomiting.  Musculoskeletal: Negative for back pain and gait problem.  Neurological: Negative for dizziness, tremors, seizures, syncope, facial asymmetry, speech difficulty, weakness, light-headedness, numbness and headaches.  Psychiatric/Behavioral: Negative for behavioral problems and confusion.    Vital Signs: There were no vitals taken for this visit.  Physical Exam Vitals reviewed.  HENT:     Mouth/Throat:     Mouth: Mucous membranes are moist.  Eyes:     Extraocular Movements: Extraocular movements intact.  Cardiovascular:     Rate and Rhythm: Normal rate and regular rhythm.     Heart sounds: Normal heart sounds.  Pulmonary:     Effort: Pulmonary effort is normal.     Breath sounds: Normal breath sounds.  Abdominal:     Palpations: Abdomen is soft.     Tenderness: There is  no abdominal tenderness.  Musculoskeletal:        General: Normal range of motion.     Cervical back: Normal range of motion.  Skin:    General: Skin is warm.  Neurological:     Mental Status: She is alert and oriented to person, place, and time.  Psychiatric:        Behavior: Behavior normal.        Judgment: Judgment normal.     Imaging: IR US Guide Vasc Access Right  Result Date: 05/12/2020 CLINICAL DATA:  Status post stent assisted angioplasty for symptomatic acute occlusion of the left internal carotid proximally in March of 2017. Recent ultrasound demonstrating severe intra stent/occlusion. EXAM: IR BILATERAL ANGIO VERTERBRAL SELECTIVE SUBCALVIAN INNOMNATE WITH MODERATE SEDATION COMPARISON:  CT angiogram of the head and neck of April 08, 2019. MEDICATIONS: Heparin 2000 units IA. No antibiotic was administered within 1 hour of the procedure. ANESTHESIA/SEDATION: Versed 1 mg IV; Fentanyl 25 mcg IV Moderate Sedation Time:  47 minutes The patient was continuously monitored during the procedure by the interventional radiology nurse under my direct supervision. CONTRAST:  Isovue 300 approximately 70 mL. FLUOROSCOPY TIME:  Fluoroscopy Time: 19 minutes 6 seconds (625 mGy). COMPLICATIONS: None immediate. TECHNIQUE: Informed written consent was obtained from the patient after a thorough discussion of the procedural risks, benefits and alternatives. All questions were addressed. Maximal Sterile Barrier Technique was utilized including caps, mask, sterile gowns, sterile gloves, sterile drape, hand hygiene and skin antiseptic. A timeout was performed prior to the initiation of the procedure. The right forearm to the wrist was prepped and draped in the usual sterile manner. The right radial artery was then identified with ultrasound, and its morphology documented. A dorsal palmar anastomosis was verified to be present. Using ultrasound guidance, radial access was obtained with a micropuncture set over a 0.018 inch micro guidewire. A 4/5 French radial sheath was then inserted. The obturator and micro guidewire were removed. Good aspiration was obtained from the side port of the sheath. A cocktail of 2000 units of heparin, 2.5 mg of verapamil, and 200 mcg of nitroglycerin was then infused in diluted form through sheath without event. A right radial arteriogram was performed. Over a 0.035 inch Roadrunner guidewire, a 5 [place] Simmons 2 diagnostic catheter was advanced to the aortic arch region, and positioned in the right vertebral artery, the right common carotid artery, the left common carotid artery, and the left vertebral artery. Following the procedure, a wrist band was applied for hemostasis at the right radial puncture site. Distal right radial pulse was  verified to be present. FINDINGS:.: FINDINGS:. The origin of the right vertebral artery is patent. There is mild tortuosity just distal to the origin. More distally the vessel is seen to opacify to the cranial skull base. Wide patency is seen of the right vertebrobasilar junction and the right posterior-inferior cerebellar artery. The basilar artery, the posterior cerebral arteries, the superior cerebellar arteries and the anterior-inferior cerebellar arteries opacify into the capillary and venous phases. Unopacified blood is seen in the basilar artery from contralateral vertebral artery. Also demonstrated is simultaneous opacification via the posterior communicating arteries bilaterally of the distal internal carotid arteries bilaterally and the supraclinoid region, with subsequent opacification of the middle cerebral arteries and the anterior cerebral arteries into the capillary and venous phases. The left vertebral artery origin demonstrates approximately 30% stenosis at its origin. More distally the vessel is seen to opacify to the cranial skull base. Wide patency is seen  of the left vertebrobasilar junction and the left posterior-inferior cerebellar artery. The basilar artery, the posterior cerebral arteries, the superior cerebellar arteries and the anterior-inferior cerebellar arteries demonstrate patency into the capillary and venous phases. Unopacified blood is seen in the basilar artery from chondral vertebral artery. Also demonstrated via both vertebral artery injections is the retrograde opacification of the external carotid artery branches via the occipital arteries. No angiographic evidence of contrast is seen of the internal carotid arteries on the delayed arterial or venous phases. In the right common carotid arteriogram demonstrates complete angiographic occlusion in its mid 1/3. This is associated with approximately 50% stenosis. The delayed sequences demonstrate no evidence of an angiographic  string sign extending into the right common carotid bifurcation or intracranially. Left common carotid arteriogram demonstrates complete angiographic occlusion of the distal left common carotid artery at the level of the bifurcation. No evidence of contrast is seen in the left external carotid artery or the internal carotid artery stented segment or distally intracranially in the left internal carotid artery. A gentle probe with an 035 inch Roadrunner guidewire was possible through the proximal occlusion of the left internal carotid artery with minimal resistance. 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 Angiographically occluded right common carotid artery in its mid third extending into the internal carotid artery in the cervical region. Again without evidence of an angiographic string sign. Prompt opacification of the distal internal carotid arteries in the supraclinoid regions retrogradely via the posterior communicating arteries from the vertebral arteries, with subsequent opacification of the middle cerebral arteries and the anterior cerebral arteries bilaterally. PLAN: Consultation to discuss management of the recently occluded left internal carotid artery stented segment. Electronically Signed   By: Luanne Bras M.D.   On: 05/11/2020 11:14   IR Radiologist Eval & Mgmt  Result Date: 05/28/2020 EXAM: ESTABLISHED PATIENT OFFICE VISIT CHIEF COMPLAINT: Discuss results of the recent diagnostic catheter arteriogram. Current Pain Level: 1-10 HISTORY OF PRESENT ILLNESS: Patient is a 70 year old right handed lady who returns today accompanied by her husband to discuss the findings of a recent diagnostic catheter arteriogram performed a few days ago. The patient is status post endovascular revascularization of occluded dominant superior division of the left middle cerebral  artery with a Solitaire retriever achieving a TICI 3 reperfusion followed by endovascular revascularization of occluded left internal carotid artery with stent assisted angioplasty on 05/13/2015. The patient has subsequently been follow-up with CT of the head and neck in February of 2021 which showed no change in the known right common carotid artery occlusion. Stented segment of the left internal carotid artery proximally and the left common carotid artery was widely patent with good antegrade flow intracranially. Ultrasound of the carotids on 04/30/2020 demonstrated suspicion of pre occlusion. Diagnostic catheter arteriogram performed on 05/11/2020 demonstrated complete occlusion of the left internal carotid artery proximal stent. Patient reports occasional episodes of bilateral visual blurring but without diplopia, or symptoms of amaurosis fugax, or complete blindness. She reports no other symptoms of near syncope, seizure-like activity, passing out spells, perioral numbness, incoordination, or with gait instability. No difficulty swallowing liquids or solids. The patient is able to use her computer at home without any difficulty as per patient and her husband. The angiographic findings were reviewed with the patient and her husband. Options considered were those of continued surveillance with the patient strongly advised to stop smoking. The second option  was to attempt at revascularization of the occluded left internal carotid artery at the bulb given the recent nature of the occlusion by ultrasound and diagnostic catheter arteriogram. The revascularization attempt was reviewed in detail. Success rate at revascularization would be 50-60%. Procedure would be under general anesthesia. Risk of 1-2% chance of a new stroke was also discussed. Questions were answered to their satisfaction. The patient and spouse expressed understanding of the management options. They stated that they would discuss the options and get  back with [place] regarding the option they would like to proceed with in the next few days. They were encouraged to call should they have any concerns or questions. Past Medical History: As per recent H and P Medications: As per recent H and P Allergies: As per recent H and P Social History: As per recent H and P Family History:  As per recent H and P REVIEW OF SYSTEMS: Negative unless as mentioned above. PHYSICAL EXAMINATION: Neurologically grossly intact except for patient with known history of chronic hearing loss in both ears. ASSESSMENT AND PLAN: See above. Electronically Signed   By: Luanne Bras M.D.   On: 05/27/2020 14:53   IR ANGIO INTRA EXTRACRAN SEL COM CAROTID INNOMINATE UNI R MOD SED  Result Date: 05/12/2020 CLINICAL DATA:  Status post stent assisted angioplasty for symptomatic acute occlusion of the left internal carotid proximally in March of 2017. Recent ultrasound demonstrating severe intra stent/occlusion. EXAM: IR BILATERAL ANGIO VERTERBRAL SELECTIVE SUBCALVIAN INNOMNATE WITH MODERATE SEDATION COMPARISON:  CT angiogram of the head and neck of April 08, 2019. MEDICATIONS: Heparin 2000 units IA. No antibiotic was administered within 1 hour of the procedure. ANESTHESIA/SEDATION: Versed 1 mg IV; Fentanyl 25 mcg IV Moderate Sedation Time:  47 minutes The patient was continuously monitored during the procedure by the interventional radiology nurse under my direct supervision. CONTRAST:  Isovue 300 approximately 70 mL. FLUOROSCOPY TIME:  Fluoroscopy Time: 19 minutes 6 seconds (625 mGy). COMPLICATIONS: None immediate. TECHNIQUE: Informed written consent was obtained from the patient after a thorough discussion of the procedural risks, benefits and alternatives. All questions were addressed. Maximal Sterile Barrier Technique was utilized including caps, mask, sterile gowns, sterile gloves, sterile drape, hand hygiene and skin antiseptic. A timeout was performed prior to the initiation of the  procedure. The right forearm to the wrist was prepped and draped in the usual sterile manner. The right radial artery was then identified with ultrasound, and its morphology documented. A dorsal palmar anastomosis was verified to be present. Using ultrasound guidance, radial access was obtained with a micropuncture set over a 0.018 inch micro guidewire. A 4/5 French radial sheath was then inserted. The obturator and micro guidewire were removed. Good aspiration was obtained from the side port of the sheath. A cocktail of 2000 units of heparin, 2.5 mg of verapamil, and 200 mcg of nitroglycerin was then infused in diluted form through sheath without event. A right radial arteriogram was performed. Over a 0.035 inch Roadrunner guidewire, a 5 [place] Simmons 2 diagnostic catheter was advanced to the aortic arch region, and positioned in the right vertebral artery, the right common carotid artery, the left common carotid artery, and the left vertebral artery. Following the procedure, a wrist band was applied for hemostasis at the right radial puncture site. Distal right radial pulse was verified to be present. FINDINGS:.: FINDINGS:. The origin of the right vertebral artery is patent. There is mild tortuosity just distal to the origin. More distally the vessel is seen to  opacify to the cranial skull base. Wide patency is seen of the right vertebrobasilar junction and the right posterior-inferior cerebellar artery. The basilar artery, the posterior cerebral arteries, the superior cerebellar arteries and the anterior-inferior cerebellar arteries opacify into the capillary and venous phases. Unopacified blood is seen in the basilar artery from contralateral vertebral artery. Also demonstrated is simultaneous opacification via the posterior communicating arteries bilaterally of the distal internal carotid arteries bilaterally and the supraclinoid region, with subsequent opacification of the middle cerebral arteries and the  anterior cerebral arteries into the capillary and venous phases. The left vertebral artery origin demonstrates approximately 30% stenosis at its origin. More distally the vessel is seen to opacify to the cranial skull base. Wide patency is seen of the left vertebrobasilar junction and the left posterior-inferior cerebellar artery. The basilar artery, the posterior cerebral arteries, the superior cerebellar arteries and the anterior-inferior cerebellar arteries demonstrate patency into the capillary and venous phases. Unopacified blood is seen in the basilar artery from chondral vertebral artery. Also demonstrated via both vertebral artery injections is the retrograde opacification of the external carotid artery branches via the occipital arteries. No angiographic evidence of contrast is seen of the internal carotid arteries on the delayed arterial or venous phases. In the right common carotid arteriogram demonstrates complete angiographic occlusion in its mid 1/3. This is associated with approximately 50% stenosis. The delayed sequences demonstrate no evidence of an angiographic string sign extending into the right common carotid bifurcation or intracranially. Left common carotid arteriogram demonstrates complete angiographic occlusion of the distal left common carotid artery at the level of the bifurcation. No evidence of contrast is seen in the left external carotid artery or the internal carotid artery stented segment or distally intracranially in the left internal carotid artery. A gentle probe with an 035 inch Roadrunner guidewire was possible through the proximal occlusion of the left internal carotid artery with minimal resistance. 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 Angiographically occluded right common carotid artery in its mid third extending into the internal carotid  artery in the cervical region. Again without evidence of an angiographic string sign. Prompt opacification of the distal internal carotid arteries in the supraclinoid regions retrogradely via the posterior communicating arteries from the vertebral arteries, with subsequent opacification of the middle cerebral arteries and the anterior cerebral arteries bilaterally. PLAN: Consultation to discuss management of the recently occluded left internal carotid artery stented segment. Electronically Signed   By: Luanne Bras M.D.   On: 05/11/2020 11:14   IR ANGIO INTRA EXTRACRAN SEL INTERNAL CAROTID UNI L MOD SED  Result Date: 05/12/2020 CLINICAL DATA:  Status post stent assisted angioplasty for symptomatic acute occlusion of the left internal carotid proximally in March of 2017. Recent ultrasound demonstrating severe intra stent/occlusion. EXAM: IR BILATERAL ANGIO VERTERBRAL SELECTIVE SUBCALVIAN INNOMNATE WITH MODERATE SEDATION COMPARISON:  CT angiogram of the head and neck of April 08, 2019. MEDICATIONS: Heparin 2000 units IA. No antibiotic was administered within 1 hour of the procedure. ANESTHESIA/SEDATION: Versed 1 mg IV; Fentanyl 25 mcg IV Moderate Sedation Time:  47 minutes The patient was continuously monitored during the procedure by the interventional radiology nurse under my direct supervision. CONTRAST:  Isovue 300 approximately 70 mL. FLUOROSCOPY TIME:  Fluoroscopy Time: 19 minutes 6 seconds (625 mGy). COMPLICATIONS: None immediate. TECHNIQUE: Informed written consent was obtained from the patient after a thorough discussion of the procedural  risks, benefits and alternatives. All questions were addressed. Maximal Sterile Barrier Technique was utilized including caps, mask, sterile gowns, sterile gloves, sterile drape, hand hygiene and skin antiseptic. A timeout was performed prior to the initiation of the procedure. The right forearm to the wrist was prepped and draped in the usual sterile manner.  The right radial artery was then identified with ultrasound, and its morphology documented. A dorsal palmar anastomosis was verified to be present. Using ultrasound guidance, radial access was obtained with a micropuncture set over a 0.018 inch micro guidewire. A 4/5 French radial sheath was then inserted. The obturator and micro guidewire were removed. Good aspiration was obtained from the side port of the sheath. A cocktail of 2000 units of heparin, 2.5 mg of verapamil, and 200 mcg of nitroglycerin was then infused in diluted form through sheath without event. A right radial arteriogram was performed. Over a 0.035 inch Roadrunner guidewire, a 5 [place] Simmons 2 diagnostic catheter was advanced to the aortic arch region, and positioned in the right vertebral artery, the right common carotid artery, the left common carotid artery, and the left vertebral artery. Following the procedure, a wrist band was applied for hemostasis at the right radial puncture site. Distal right radial pulse was verified to be present. FINDINGS:.: FINDINGS:. The origin of the right vertebral artery is patent. There is mild tortuosity just distal to the origin. More distally the vessel is seen to opacify to the cranial skull base. Wide patency is seen of the right vertebrobasilar junction and the right posterior-inferior cerebellar artery. The basilar artery, the posterior cerebral arteries, the superior cerebellar arteries and the anterior-inferior cerebellar arteries opacify into the capillary and venous phases. Unopacified blood is seen in the basilar artery from contralateral vertebral artery. Also demonstrated is simultaneous opacification via the posterior communicating arteries bilaterally of the distal internal carotid arteries bilaterally and the supraclinoid region, with subsequent opacification of the middle cerebral arteries and the anterior cerebral arteries into the capillary and venous phases. The left vertebral artery origin  demonstrates approximately 30% stenosis at its origin. More distally the vessel is seen to opacify to the cranial skull base. Wide patency is seen of the left vertebrobasilar junction and the left posterior-inferior cerebellar artery. The basilar artery, the posterior cerebral arteries, the superior cerebellar arteries and the anterior-inferior cerebellar arteries demonstrate patency into the capillary and venous phases. Unopacified blood is seen in the basilar artery from chondral vertebral artery. Also demonstrated via both vertebral artery injections is the retrograde opacification of the external carotid artery branches via the occipital arteries. No angiographic evidence of contrast is seen of the internal carotid arteries on the delayed arterial or venous phases. In the right common carotid arteriogram demonstrates complete angiographic occlusion in its mid 1/3. This is associated with approximately 50% stenosis. The delayed sequences demonstrate no evidence of an angiographic string sign extending into the right common carotid bifurcation or intracranially. Left common carotid arteriogram demonstrates complete angiographic occlusion of the distal left common carotid artery at the level of the bifurcation. No evidence of contrast is seen in the left external carotid artery or the internal carotid artery stented segment or distally intracranially in the left internal carotid artery. A gentle probe with an 035 inch Roadrunner guidewire was possible through the proximal occlusion of the left internal carotid artery with minimal resistance. 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 Angiographically  occluded right common carotid artery in its mid third extending into the internal carotid artery in the cervical region. Again without evidence of an angiographic string sign. Prompt  opacification of the distal internal carotid arteries in the supraclinoid regions retrogradely via the posterior communicating arteries from the vertebral arteries, with subsequent opacification of the middle cerebral arteries and the anterior cerebral arteries bilaterally. PLAN: Consultation to discuss management of the recently occluded left internal carotid artery stented segment. Electronically Signed   By: Luanne Bras M.D.   On: 05/11/2020 11:14   IR ANGIO VERTEBRAL SEL SUBCLAVIAN INNOMINATE BILAT MOD SED  Result Date: 05/12/2020 CLINICAL DATA:  Status post stent assisted angioplasty for symptomatic acute occlusion of the left internal carotid proximally in March of 2017. Recent ultrasound demonstrating severe intra stent/occlusion. EXAM: IR BILATERAL ANGIO VERTERBRAL SELECTIVE SUBCALVIAN INNOMNATE WITH MODERATE SEDATION COMPARISON:  CT angiogram of the head and neck of April 08, 2019. MEDICATIONS: Heparin 2000 units IA. No antibiotic was administered within 1 hour of the procedure. ANESTHESIA/SEDATION: Versed 1 mg IV; Fentanyl 25 mcg IV Moderate Sedation Time:  47 minutes The patient was continuously monitored during the procedure by the interventional radiology nurse under my direct supervision. CONTRAST:  Isovue 300 approximately 70 mL. FLUOROSCOPY TIME:  Fluoroscopy Time: 19 minutes 6 seconds (625 mGy). COMPLICATIONS: None immediate. TECHNIQUE: Informed written consent was obtained from the patient after a thorough discussion of the procedural risks, benefits and alternatives. All questions were addressed. Maximal Sterile Barrier Technique was utilized including caps, mask, sterile gowns, sterile gloves, sterile drape, hand hygiene and skin antiseptic. A timeout was performed prior to the initiation of the procedure. The right forearm to the wrist was prepped and draped in the usual sterile manner. The right radial artery was then identified with ultrasound, and its morphology documented. A  dorsal palmar anastomosis was verified to be present. Using ultrasound guidance, radial access was obtained with a micropuncture set over a 0.018 inch micro guidewire. A 4/5 French radial sheath was then inserted. The obturator and micro guidewire were removed. Good aspiration was obtained from the side port of the sheath. A cocktail of 2000 units of heparin, 2.5 mg of verapamil, and 200 mcg of nitroglycerin was then infused in diluted form through sheath without event. A right radial arteriogram was performed. Over a 0.035 inch Roadrunner guidewire, a 5 [place] Simmons 2 diagnostic catheter was advanced to the aortic arch region, and positioned in the right vertebral artery, the right common carotid artery, the left common carotid artery, and the left vertebral artery. Following the procedure, a wrist band was applied for hemostasis at the right radial puncture site. Distal right radial pulse was verified to be present. FINDINGS:.: FINDINGS:. The origin of the right vertebral artery is patent. There is mild tortuosity just distal to the origin. More distally the vessel is seen to opacify to the cranial skull base. Wide patency is seen of the right vertebrobasilar junction and the right posterior-inferior cerebellar artery. The basilar artery, the posterior cerebral arteries, the superior cerebellar arteries and the anterior-inferior cerebellar arteries opacify into the capillary and venous phases. Unopacified blood is seen in the basilar artery from contralateral vertebral artery. Also demonstrated is simultaneous opacification via the posterior communicating arteries bilaterally of the distal internal carotid arteries bilaterally and the supraclinoid region, with subsequent opacification of the middle cerebral arteries and the anterior cerebral arteries into the capillary and venous phases. The left vertebral artery origin demonstrates approximately 30% stenosis at its origin. More  distally the vessel is seen to  opacify to the cranial skull base. Wide patency is seen of the left vertebrobasilar junction and the left posterior-inferior cerebellar artery. The basilar artery, the posterior cerebral arteries, the superior cerebellar arteries and the anterior-inferior cerebellar arteries demonstrate patency into the capillary and venous phases. Unopacified blood is seen in the basilar artery from chondral vertebral artery. Also demonstrated via both vertebral artery injections is the retrograde opacification of the external carotid artery branches via the occipital arteries. No angiographic evidence of contrast is seen of the internal carotid arteries on the delayed arterial or venous phases. In the right common carotid arteriogram demonstrates complete angiographic occlusion in its mid 1/3. This is associated with approximately 50% stenosis. The delayed sequences demonstrate no evidence of an angiographic string sign extending into the right common carotid bifurcation or intracranially. Left common carotid arteriogram demonstrates complete angiographic occlusion of the distal left common carotid artery at the level of the bifurcation. No evidence of contrast is seen in the left external carotid artery or the internal carotid artery stented segment or distally intracranially in the left internal carotid artery. A gentle probe with an 035 inch Roadrunner guidewire was possible through the proximal occlusion of the left internal carotid artery with minimal resistance. 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 Angiographically occluded right common carotid artery in its mid third extending into the internal carotid artery in the cervical region. Again without evidence of an angiographic string sign. Prompt opacification of the distal internal carotid arteries in the supraclinoid regions retrogradely  via the posterior communicating arteries from the vertebral arteries, with subsequent opacification of the middle cerebral arteries and the anterior cerebral arteries bilaterally. PLAN: Consultation to discuss management of the recently occluded left internal carotid artery stented segment. Electronically Signed   By: Luanne Bras M.D.   On: 05/11/2020 11:14    Labs:  CBC: Recent Labs    05/11/20 0645  WBC 9.7  HGB 15.9*  HCT 48.6*  PLT 226    COAGS: Recent Labs    05/11/20 0815  INR 1.0    BMP: Recent Labs    05/11/20 0645  NA 138  K 4.0  CL 103  CO2 29  GLUCOSE 94  BUN 6*  CALCIUM 8.8*  CREATININE 0.88  GFRNONAA >60    LIVER FUNCTION TESTS: No results for input(s): BILITOT, AST, ALT, ALKPHOS, PROT, ALBUMIN in the last 8760 hours.  TUMOR MARKERS: No results for input(s): AFPTM, CEA, CA199, CHROMGRNA in the last 8760 hours.  Assessment and Plan:  Known to NIR L MCA revascularization TICI 3 reperfusion And  L ICA angioplasty/stent revascularization--- 05/09/2015 Recent arteriogram shows Left internal carotid artery stent occlusion Scheduled now for revascularization with Dr Estanislado Pandy Risks and benefits of cerebral angiogram with intervention were discussed with the patient including, but not limited to bleeding, infection, vascular injury, contrast induced renal failure, stroke or even death.  This interventional procedure involves the use of X-rays and because of the nature of the planned procedure, it is possible that we will have prolonged use of X-ray fluoroscopy.  Potential radiation risks to you include (but are not limited to) the following: - A slightly elevated risk for cancer  several years later in life. This risk is typically less than 0.5% percent. This risk is low in comparison to the normal incidence of human cancer, which is 33% for  women and 50% for men according to the [place]. - Radiation induced injury can include  skin redness, resembling a rash, tissue breakdown / ulcers and hair loss (which can be temporary or permanent).   The likelihood of either of these occurring depends on the difficulty of the procedure and whether you are sensitive to radiation due to previous procedures, disease, or genetic conditions.   IF your procedure requires a prolonged use of radiation, you will be notified and given written instructions for further action.  It is your responsibility to monitor the irradiated area for the 2 weeks following the procedure and to notify your physician if you are concerned that you have suffered a radiation induced injury.    All of the patient's questions were answered, patient is agreeable to proceed.  Consent signed and in chart.  Thank you for this interesting consult.  I greatly enjoyed meeting Jya Hughston and look forward to participating in their care.  A copy of this report was sent to the requesting provider on this date.  Electronically Signed: Lavonia Drafts, PA-C 06/08/2020, 7:57 AM   I spent a total of    25 Minutes in face to face in clinical consultation, greater than 50% of which was counseling/coordinating care for L ICA revascularization.

## 2020-06-09 ENCOUNTER — Other Ambulatory Visit (HOSPITAL_COMMUNITY): Payer: Medicare HMO

## 2020-06-09 NOTE — Anesthesia Postprocedure Evaluation (Addendum)
Anesthesia Post Note  Patient: Monica Pugh  Procedure(s) Performed: IR WITH ANESTHESIA ANGIOPLASTY (N/A )     Patient location during evaluation: PACU Anesthesia Type: MAC Level of consciousness: sedated Pain management: pain level controlled Vital Signs Assessment: post-procedure vital signs reviewed and stable Respiratory status: spontaneous breathing Cardiovascular status: stable Anesthetic complications: no   No complications documented.  Last Vitals:  Vitals:   06/08/20 1350 06/08/20 1404  BP: (!) 132/52 (!) 136/57  Pulse: 62 69  Resp: 18 17  Temp: 36.6 C   SpO2: 100% 93%    Last Pain:  Vitals:   06/08/20 1350  TempSrc:   PainSc: 0-No pain                 Nolon Nations

## 2020-06-12 ENCOUNTER — Ambulatory Visit: Admit: 2020-06-12 | Payer: Medicare HMO | Admitting: Interventional Radiology

## 2020-06-12 SURGERY — IR WITH ANESTHESIA
Anesthesia: General | Laterality: Left

## 2020-06-13 ENCOUNTER — Inpatient Hospital Stay (HOSPITAL_COMMUNITY): Admission: RE | Admit: 2020-06-13 | Payer: Medicare HMO | Source: Ambulatory Visit

## 2020-06-16 ENCOUNTER — Telehealth (HOSPITAL_COMMUNITY): Payer: Self-pay

## 2020-06-16 NOTE — Telephone Encounter (Signed)
Returned pt's call, no answer, left vm. AW  

## 2020-06-23 ENCOUNTER — Other Ambulatory Visit (HOSPITAL_COMMUNITY): Payer: Self-pay | Admitting: Interventional Radiology

## 2020-06-23 DIAGNOSIS — I771 Stricture of artery: Secondary | ICD-10-CM

## 2020-06-24 ENCOUNTER — Other Ambulatory Visit (HOSPITAL_COMMUNITY): Payer: Self-pay

## 2020-06-24 ENCOUNTER — Encounter (HOSPITAL_COMMUNITY): Payer: Self-pay | Admitting: Interventional Radiology

## 2020-06-24 ENCOUNTER — Other Ambulatory Visit: Payer: Self-pay

## 2020-06-24 ENCOUNTER — Other Ambulatory Visit (HOSPITAL_COMMUNITY)
Admission: RE | Admit: 2020-06-24 | Discharge: 2020-06-24 | Disposition: A | Discharge status: Home / Self Care | Payer: Medicare HMO | Source: Ambulatory Visit | Attending: Interventional Radiology | Admitting: Interventional Radiology

## 2020-06-24 DIAGNOSIS — I771 Stricture of artery: Secondary | ICD-10-CM

## 2020-06-24 DIAGNOSIS — Z20822 Contact with and (suspected) exposure to covid-19: Secondary | ICD-10-CM | POA: Insufficient documentation

## 2020-06-24 DIAGNOSIS — Z01812 Encounter for preprocedural laboratory examination: Secondary | ICD-10-CM | POA: Insufficient documentation

## 2020-06-24 LAB — SARS CORONAVIRUS 2 (TAT 6-24 HRS): SARS Coronavirus 2: NEGATIVE

## 2020-06-24 LAB — PLATELET INHIBITION P2Y12: Platelet Function  P2Y12: 162 [PRU] — ABNORMAL LOW (ref 182–335)

## 2020-06-24 NOTE — Progress Notes (Addendum)
Spoke with Daughter Novali Vollman for PAT information.  PCP - Irven Shelling, FNP-C Cardiologist - n/a  Chest x-ray - n/a EKG - n/a Stress Test - n/a ECHO - 05/10/15 Cardiac Cath - n/a  Sleep Study -  Yes CPAP - does not use cpap  Blood Thinner Instructions:  Last dose of Plavix was on  06/21/20.  Aspirin Instructions: Follow your surgeon's instructions on when to stop aspirin prior to surgery,  If no instructions were given by your surgeon then you will need to call the office for those instructions.  Anesthesia review: Yes  STOP now taking any Aspirin (unless otherwise instructed by your surgeon), Aleve, Naproxen, Ibuprofen, Motrin, Advil, Goody's, BC's, all herbal medications, fish oil, and all vitamins.   Coronavirus Screening Covid test on 06/24/20 is pending results. Do you have any of the following symptoms:  Cough yes/no: No Fever (>100.68F)  yes/no: No Runny nose yes/no: No Sore throat yes/no: No Difficulty breathing/shortness of breath  yes/no: No  Have you traveled in the last 14 days and where? yes/no: No  Patient verbalized understanding of instructions that were given via phone.

## 2020-06-25 ENCOUNTER — Other Ambulatory Visit: Payer: Self-pay | Admitting: Radiology

## 2020-06-25 NOTE — Anesthesia Preprocedure Evaluation (Addendum)
Anesthesia Evaluation  Patient identified by MRN, date of birth, ID band Patient awake    Reviewed: Allergy & Precautions, NPO status , Patient's Chart, lab work & pertinent test results  Airway Mallampati: II  TM Distance: >3 FB Neck ROM: Full    Dental  (+) Edentulous Upper   Pulmonary sleep apnea , Current Smoker (last cigarrett 5/5 PM) and Patient abstained from smoking.,    Pulmonary exam normal breath sounds clear to auscultation       Cardiovascular METS: 3 - Mets hypertension, + Peripheral Vascular Disease  Normal cardiovascular exam Rhythm:Regular Rate:Normal  On ASA/Plavix   Neuro/Psych CVA negative psych ROS   GI/Hepatic Neg liver ROS, GERD  ,  Endo/Other  diabetesHypothyroidism hyperlipidemia  Renal/GU   negative genitourinary   Musculoskeletal negative musculoskeletal ROS (+)   Abdominal   Peds negative pediatric ROS (+)  Hematology  (+) anemia ,   Anesthesia Other Findings   Reproductive/Obstetrics negative OB ROS                          Anesthesia Physical Anesthesia Plan  ASA: IV  Anesthesia Plan: General   Post-op Pain Management:    Induction: Intravenous  PONV Risk Score and Plan: Ondansetron, Dexamethasone and Treatment may vary due to age or medical condition  Airway Management Planned: Oral ETT  Additional Equipment: Arterial line  Intra-op Plan:   Post-operative Plan: Extubation in OR  Informed Consent: I have reviewed the patients History and Physical, chart, labs and discussed the procedure including the risks, benefits and alternatives for the proposed anesthesia with the patient or authorized representative who has indicated his/her understanding and acceptance.     Dental advisory given  Plan Discussed with: CRNA, Anesthesiologist and Surgeon  Anesthesia Plan Comments: (+/- arterial line depending on availability of access due to nature of the  procedure. Clearsight an option.  )     Anesthesia Quick Evaluation

## 2020-06-25 NOTE — Progress Notes (Signed)
Anesthesia Chart Review:  Case: [id number] Date/Time: 06/26/20 1015   Procedure: REVASCULATION (N/A )   Anesthesia type: General   Pre-op diagnosis: CARTARID OCCLUSION   Location: [place] [facility] [place] [facility]   Surgeons: Luanne Bras, MD      DISCUSSION: Patient is a 70 year old female scheduled for bilateral lower extremity arteriogram with possible angioplasty/stenting bilateral iliac arteries by Ruthann Cancer, MD and cerebral arteriogram with possible angioplasty/stenting left internal carotid artery by Luanne Bras, MD on 06/26/20.  She initially presented for cerebral arteriogram with possible LICA angioplasty/stenting on 06/08/20 with procedural findings showing known occluded left ICA stented segment that was partially recanalized with mechanical thrombolysis with antegrade flow into the left MCA and also finding of severe bilateral CIA stenosis. Dr. Estanislado Pandy recommended revascularization of CIA arteries prior to complete revascularization of occluded stented LICA. The following procedures now scheduled by IR.  History includes smoking, CVA (acute left MCA infarct with occlusion bilateral proximal ICAs 05/09/15, s/p tPA with left MCA and LICA revascularization with proximal left ICA stent 05/09/15, post procedure left SAH), carotid artery stenosis (bilateral proximal ICA occlusion, s/p stent assisted angioplasty left ICA 05/09/15 in setting of acute CVA), HLD, PVD, OSA (does not use CPAP), anemia, hearing loss, GERD.   Last EKG noted is > 1 year ago. She is a same day work-up, so she is for labs and anesthesia team evaluation on the day of procedure. Medication list includes ASA 81 mg and Plavix 75 mg daily; however per PAT RN conversation with patient's daughter, last Plavix 06/21/20. I spoke with Caryl Pina in IR who will contact patient/daughter to clarify. Patient should be on ASA and Plavix for carotid procedure. She did say that Dr. Estanislado Pandy felt p2y12 of 162 from 06/24/20 was acceptable.    Preprocedure COVID-19 test was negative on 06/24/20.    VS: Ht 5' 3.5" (1.613 m)   Wt 83.9 kg   LMP  (LMP Unknown)   BMI 32.26 kg/m   BP Readings from Last 3 Encounters:  06/08/20 (!) 136/57  05/11/20 (!) 143/64  04/30/20 (!) 156/78   Pulse Readings from Last 3 Encounters:  06/08/20 69  05/11/20 77  04/30/20 75    PROVIDERS: [place], Rosalyn Charters, MD is listed as PCP. PAT RN documented PCP as Irven Shelling, NP. Ruta Hinds, MD is vascular surgeon. Last evaluation 04/30/20 by Risa Grill, PA-C. Patient referred back to IR given US showed likely stenosis of her previously placed left carotid stent. - Izora Gala, MD is ENT. 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   LABS: For day of procedure. As of 06/08/20, Cr 0.88, CBC normal. P2y12 162 on 06/24/20.    IMAGES: Cerebral Arteriogram/Mechanical thrombolysis 06/08/20 Estanislado Pandy, Willaim Rayas, MD): IMPRESSION: - Status post mechanical thrombolysis of left internal carotid artery proximal stent with antegrade flow noted to the left internal carotid terminus. - Severe pre occlusive stenosis of the left common iliac artery at its origin, and the distal left common iliac artery. PLAN: - Findings were reviewed with the patient and spouse. - Case was also discussed with Dr Serafina Royals of Interventional Radiology. It was decided to proceed with bilateral common iliac artery kissing stents followed by left internal carotid artery revascularization under general anesthesia. This has been scheduled for this Friday a.m.   EKG: Last EKG  noted are from 05/09/15 and 12/19/17 ([facility]).   CV: Carotid US 04/30/20: Summary:  - Right Carotid: Evidence consistent with a total occlusion of the right ICA.  - Left Carotid:  Limited color and spectral Doppler flow observed within the common carotid and internal carotid arteries. Pre-occlusion thump noted.  - Vertebrals: Bilateral vertebral arteries demonstrate antegrade flow.  - Subclavians: Normal flow hemodynamics were seen in bilateral subclavian arteries.    Echo 05/10/15: Study Conclusions  - Left ventricle: The cavity size was normal. Wall thickness was  increased in a pattern of mild LVH. Systolic function was normal.  The estimated ejection fraction was in the range of 55% to 60%.  Wall motion was normal; there were no regional wall motion  abnormalities. Doppler parameters are consistent with abnormal  left ventricular relaxation (grade 1 diastolic dysfunction).  - Left atrium: The atrium was mildly dilated.  - Pulmonary arteries: Systolic pressure was mildly increased. PA  peak pressure: 34 mm Hg (S).  Impressions:  - Normal LV systolic function; mild LVH; grade 1 diastolic  dysfunction; mild LAE; trace TR with mildly elevated pulmonary  pressure.    Past Medical History:  Diagnosis Date  . Anemia   . Carotid artery occlusion   . GERD (gastroesophageal reflux disease)   . Hearing loss    bilateral - no hearing aids  . Hyperlipidemia   . Peripheral vascular disease ([place])   . Sleep apnea    does not use cpap  . Stroke ([place])   . Vitamin B 12 deficiency   . Vitamin D deficiency     Past Surgical History:  Procedure Laterality Date  . COLONOSCOPY    . IR ANGIO INTRA EXTRACRAN SEL COM CAROTID INNOMINATE UNI R MOD SED  05/11/2020  . IR ANGIO INTRA EXTRACRAN SEL INTERNAL CAROTID UNI L MOD SED  05/11/2020  . IR ANGIO INTRA EXTRACRAN SEL INTERNAL CAROTID UNI L MOD SED  06/08/2020  . IR ANGIO VERTEBRAL SEL SUBCLAVIAN INNOMINATE BILAT MOD SED  05/11/2020  . IR ANGIOGRAM EXTREMITY BILATERAL  06/08/2020  . IR RADIOLOGIST EVAL & MGMT  05/28/2020  . IR US GUIDE VASC ACCESS RIGHT  05/11/2020  . IR US GUIDE VASC ACCESS RIGHT  06/08/2020   . RADIOLOGY WITH ANESTHESIA N/A 05/09/2015   Procedure: RADIOLOGY WITH ANESTHESIA;  Surgeon: Luanne Bras, MD;  Location: [facility];  Service: Radiology;  Laterality: N/A;  . RADIOLOGY WITH ANESTHESIA N/A 06/08/2020   Procedure: IR WITH ANESTHESIA ANGIOPLASTY;  Surgeon: Luanne Bras, MD;  Location: [place];  Service: Radiology;  Laterality: N/A;  . TUBAL LIGATION      MEDICATIONS: No current facility-administered medications for this encounter.   Marland Kitchen acetaminophen (TYLENOL) 500 MG tablet  . aspirin EC 81 MG tablet  . atorvastatin (LIPITOR) 40 MG tablet  . Cholecalciferol (DIALYVITE VITAMIN D 5000) 125 MCG (5000 UT) capsule  . clopidogrel (PLAVIX) 75 MG tablet  . Cyanocobalamin (VITAMIN B-12) 2500 MCG SUBL  . pantoprazole (PROTONIX) 40 MG tablet    Myra Gianotti, PA-C Surgical Short Stay/Anesthesiology [facility] Phone [phone] [facility] Phone [phone] 06/25/2020 1:07 PM

## 2020-06-25 NOTE — Progress Notes (Signed)
Pt's daughter Malachy Mood made aware of the surgery time change to 0830-1130, and for the pt to arrive at 0600 on 06/26/20.

## 2020-06-26 ENCOUNTER — Other Ambulatory Visit: Payer: Self-pay

## 2020-06-26 ENCOUNTER — Inpatient Hospital Stay (HOSPITAL_COMMUNITY)
Admission: RE | Admit: 2020-06-26 | Discharge: 2020-06-27 | DRG: 253 | Disposition: A | Discharge status: Home / Self Care | Payer: Medicare HMO | Attending: Interventional Radiology | Admitting: Interventional Radiology

## 2020-06-26 ENCOUNTER — Encounter (HOSPITAL_COMMUNITY): Payer: Self-pay | Admitting: Interventional Radiology

## 2020-06-26 ENCOUNTER — Inpatient Hospital Stay (HOSPITAL_COMMUNITY): Payer: Medicare HMO | Admitting: Vascular Surgery

## 2020-06-26 ENCOUNTER — Inpatient Hospital Stay (HOSPITAL_COMMUNITY): Payer: Medicare HMO

## 2020-06-26 ENCOUNTER — Ambulatory Visit (HOSPITAL_COMMUNITY)
Admission: RE | Admit: 2020-06-26 | Discharge: 2020-06-26 | Disposition: A | Discharge status: Home / Self Care | Payer: Medicare HMO | Source: Ambulatory Visit | Attending: Interventional Radiology | Admitting: Interventional Radiology

## 2020-06-26 ENCOUNTER — Encounter (HOSPITAL_COMMUNITY)
Admission: RE | Disposition: A | Discharge status: Home / Self Care | Payer: Self-pay | Source: Home / Self Care | Attending: Interventional Radiology

## 2020-06-26 DIAGNOSIS — Y712 Prosthetic and other implants, materials and accessory cardiovascular devices associated with adverse incidents: Secondary | ICD-10-CM | POA: Diagnosis present

## 2020-06-26 DIAGNOSIS — Z8673 Personal history of transient ischemic attack (TIA), and cerebral infarction without residual deficits: Secondary | ICD-10-CM | POA: Diagnosis not present

## 2020-06-26 DIAGNOSIS — I6523 Occlusion and stenosis of bilateral carotid arteries: Secondary | ICD-10-CM | POA: Diagnosis present

## 2020-06-26 DIAGNOSIS — I708 Atherosclerosis of other arteries: Secondary | ICD-10-CM | POA: Diagnosis not present

## 2020-06-26 DIAGNOSIS — G4733 Obstructive sleep apnea (adult) (pediatric): Secondary | ICD-10-CM | POA: Diagnosis not present

## 2020-06-26 DIAGNOSIS — E785 Hyperlipidemia, unspecified: Secondary | ICD-10-CM | POA: Diagnosis not present

## 2020-06-26 DIAGNOSIS — I639 Cerebral infarction, unspecified: Secondary | ICD-10-CM

## 2020-06-26 DIAGNOSIS — I63239 Cerebral infarction due to unspecified occlusion or stenosis of unspecified carotid arteries: Secondary | ICD-10-CM

## 2020-06-26 DIAGNOSIS — Z7982 Long term (current) use of aspirin: Secondary | ICD-10-CM

## 2020-06-26 DIAGNOSIS — Z20822 Contact with and (suspected) exposure to covid-19: Secondary | ICD-10-CM | POA: Diagnosis present

## 2020-06-26 DIAGNOSIS — I70203 Unspecified atherosclerosis of native arteries of extremities, bilateral legs: Secondary | ICD-10-CM | POA: Diagnosis not present

## 2020-06-26 DIAGNOSIS — F17210 Nicotine dependence, cigarettes, uncomplicated: Secondary | ICD-10-CM | POA: Diagnosis not present

## 2020-06-26 DIAGNOSIS — I771 Stricture of artery: Secondary | ICD-10-CM

## 2020-06-26 DIAGNOSIS — T82856A Stenosis of peripheral vascular stent, initial encounter: Secondary | ICD-10-CM | POA: Diagnosis not present

## 2020-06-26 DIAGNOSIS — E538 Deficiency of other specified B group vitamins: Secondary | ICD-10-CM | POA: Diagnosis present

## 2020-06-26 DIAGNOSIS — I10 Essential (primary) hypertension: Secondary | ICD-10-CM | POA: Diagnosis present

## 2020-06-26 DIAGNOSIS — K219 Gastro-esophageal reflux disease without esophagitis: Secondary | ICD-10-CM | POA: Diagnosis not present

## 2020-06-26 DIAGNOSIS — E559 Vitamin D deficiency, unspecified: Secondary | ICD-10-CM | POA: Diagnosis present

## 2020-06-26 DIAGNOSIS — I6522 Occlusion and stenosis of left carotid artery: Secondary | ICD-10-CM | POA: Diagnosis present

## 2020-06-26 DIAGNOSIS — Z79899 Other long term (current) drug therapy: Secondary | ICD-10-CM | POA: Diagnosis not present

## 2020-06-26 DIAGNOSIS — Z7902 Long term (current) use of antithrombotics/antiplatelets: Secondary | ICD-10-CM

## 2020-06-26 HISTORY — PX: IR CT HEAD LTD: IMG2386

## 2020-06-26 HISTORY — PX: RADIOLOGY WITH ANESTHESIA: SHX6223

## 2020-06-26 HISTORY — PX: IR ANGIO INTRA EXTRACRAN SEL INTERNAL CAROTID UNI L MOD SED: IMG5361

## 2020-06-26 HISTORY — PX: IR US GUIDE VASC ACCESS RIGHT: IMG2390

## 2020-06-26 HISTORY — PX: IR ILIAC ART STENT INC PTA MOD SED: IMG2306

## 2020-06-26 HISTORY — PX: IR INTRAVSC STENT CERV CAROTID W/EMB-PROT MOD SED INCL ANGIO: IMG2303

## 2020-06-26 HISTORY — PX: IR IVUS EACH ADDITIONAL NON CORONARY VESSEL: IMG6086

## 2020-06-26 HISTORY — PX: IR US GUIDE VASC ACCESS LEFT: IMG2389

## 2020-06-26 LAB — POCT ACTIVATED CLOTTING TIME
Activated Clotting Time: 142 seconds
Activated Clotting Time: 184 seconds
Activated Clotting Time: 255 seconds
Activated Clotting Time: 255 seconds
Activated Clotting Time: 237 seconds
Activated Clotting Time: 225 seconds

## 2020-06-26 LAB — BASIC METABOLIC PANEL
Anion gap: 7 (ref 5–15)
BUN: 7 mg/dL — ABNORMAL LOW (ref 8–23)
CO2: 26 mmol/L (ref 22–32)
Calcium: 9.1 mg/dL (ref 8.9–10.3)
Chloride: 105 mmol/L (ref 98–111)
Creatinine, Ser: 0.9 mg/dL (ref 0.44–1.00)
GFR, Estimated: >60 mL/min (ref >60)
Glucose, Bld: 105 mg/dL — ABNORMAL HIGH (ref 70–99)
Potassium: 3.8 mmol/L (ref 3.5–5.1)
Sodium: 138 mmol/L (ref 135–145)

## 2020-06-26 LAB — PLATELET INHIBITION P2Y12: Platelet Function  P2Y12: 57 [PRU] — ABNORMAL LOW (ref 182–335)

## 2020-06-26 LAB — CBC WITH DIFFERENTIAL/PLATELET
Abs Immature Granulocytes: 0.03 10*3/uL (ref 0.00–0.07)
Basophils Absolute: 0.1 10*3/uL (ref 0.0–0.1)
Basophils Relative: 1 %
Eosinophils Absolute: 0.1 10*3/uL (ref 0.0–0.5)
Eosinophils Relative: 1 %
HCT: 43.5 % (ref 36.0–46.0)
Hemoglobin: 14.2 g/dL (ref 12.0–15.0)
Immature Granulocytes: 0 %
Lymphocytes Relative: 27 %
Lymphs Abs: 2.4 10*3/uL (ref 0.7–4.0)
MCH: 31.9 pg (ref 26.0–34.0)
MCHC: 32.6 g/dL (ref 30.0–36.0)
MCV: 97.8 fL (ref 80.0–100.0)
Monocytes Absolute: 0.8 10*3/uL (ref 0.1–1.0)
Monocytes Relative: 9 %
Neutro Abs: 5.5 10*3/uL (ref 1.7–7.7)
Neutrophils Relative %: 62 %
Platelets: 268 10*3/uL (ref 150–400)
RBC: 4.45 MIL/uL (ref 3.87–5.11)
RDW: 14.6 % (ref 11.5–15.5)
WBC: 9 10*3/uL (ref 4.0–10.5)
nRBC: 0 % (ref 0.0–0.2)

## 2020-06-26 LAB — TYPE AND SCREEN
ABO/RH(D): O POS
Antibody Screen: NEGATIVE

## 2020-06-26 LAB — PROTIME-INR
INR: 1 (ref 0.8–1.2)
Prothrombin Time: 12.7 seconds (ref 11.4–15.2)

## 2020-06-26 LAB — ABO/RH: ABO/RH(D): O POS

## 2020-06-26 LAB — MRSA PCR SCREENING: MRSA by PCR: NEGATIVE

## 2020-06-26 SURGERY — IR WITH ANESTHESIA
Anesthesia: General

## 2020-06-26 MED ORDER — EPTIFIBATIDE 20 MG/10ML IV SOLN
INTRAVENOUS | Status: AC
  Filled 2020-06-26: qty 10

## 2020-06-26 MED ORDER — PHENYLEPHRINE 40 MCG/ML (10ML) SYRINGE FOR IV PUSH (FOR BLOOD PRESSURE SUPPORT)
PREFILLED_SYRINGE | INTRAVENOUS | Status: DC | PRN
  Administered 2020-06-26 (×2): 80 ug via INTRAVENOUS

## 2020-06-26 MED ORDER — MIDAZOLAM HCL 5 MG/5ML IJ SOLN
INTRAMUSCULAR | Status: DC | PRN
  Administered 2020-06-26 (×2): 1 mg via INTRAVENOUS

## 2020-06-26 MED ORDER — FENTANYL CITRATE (PF) 100 MCG/2ML IJ SOLN
INTRAMUSCULAR | Status: AC
  Filled 2020-06-26: qty 2

## 2020-06-26 MED ORDER — ASPIRIN EC 325 MG PO TBEC: 325.0000 mg | DELAYED_RELEASE_TABLET | ORAL | Status: DC

## 2020-06-26 MED ORDER — NIMODIPINE 30 MG PO CAPS: 0.0000 mg | ORAL_CAPSULE | ORAL | Status: DC

## 2020-06-26 MED ORDER — LACTATED RINGERS IV SOLN: INTRAVENOUS | Status: DC

## 2020-06-26 MED ORDER — PANTOPRAZOLE SODIUM 40 MG PO TBEC
40.0000 mg | DELAYED_RELEASE_TABLET | Freq: Every day | ORAL | Status: DC
  Administered 2020-06-27: 40 mg via ORAL
  Filled 2020-06-26: qty 1

## 2020-06-26 MED ORDER — CLEVIDIPINE BUTYRATE 0.5 MG/ML IV EMUL
INTRAVENOUS | Status: DC | PRN
  Administered 2020-06-26: 2 mg/h via INTRAVENOUS

## 2020-06-26 MED ORDER — SUGAMMADEX SODIUM 200 MG/2ML IV SOLN
INTRAVENOUS | Status: DC | PRN
  Administered 2020-06-26: 400 mg via INTRAVENOUS

## 2020-06-26 MED ORDER — HEPARIN (PORCINE) 25000 UT/250ML-% IV SOLN
500.0000 [IU]/h | INTRAVENOUS | Status: DC
  Filled 2020-06-26: qty 250

## 2020-06-26 MED ORDER — LIDOCAINE 2% (20 MG/ML) 5 ML SYRINGE
INTRAMUSCULAR | Status: DC | PRN
  Administered 2020-06-26: 80 mg via INTRAVENOUS

## 2020-06-26 MED ORDER — FENTANYL CITRATE (PF) 250 MCG/5ML IJ SOLN: INTRAMUSCULAR | Status: DC | PRN

## 2020-06-26 MED ORDER — ORAL CARE MOUTH RINSE: 15.0000 mL | Freq: Once | OROMUCOSAL | Status: AC

## 2020-06-26 MED ORDER — ATORVASTATIN CALCIUM 40 MG PO TABS
40.0000 mg | ORAL_TABLET | Freq: Every day | ORAL | Status: DC
  Administered 2020-06-27: 40 mg via ORAL
  Filled 2020-06-26: qty 1

## 2020-06-26 MED ORDER — OXYCODONE HCL 5 MG PO TABS: 5.0000 mg | ORAL_TABLET | Freq: Once | ORAL | Status: DC | PRN

## 2020-06-26 MED ORDER — ACETAMINOPHEN 160 MG/5ML PO SOLN: 650.0000 mg | ORAL | Status: DC | PRN

## 2020-06-26 MED ORDER — IOHEXOL 300 MG/ML  SOLN
50.0000 mL | Freq: Once | INTRAMUSCULAR | Status: AC | PRN
  Administered 2020-06-26: 50 mL via INTRAVENOUS

## 2020-06-26 MED ORDER — IOHEXOL 300 MG/ML  SOLN: 50.0000 mL | Freq: Once | INTRAMUSCULAR | Status: DC | PRN

## 2020-06-26 MED ORDER — HEPARIN (PORCINE) 25000 UT/250ML-% IV SOLN
650.0000 [IU]/h | INTRAVENOUS | Status: DC
  Administered 2020-06-26: 650 [IU]/h via INTRAVENOUS
  Administered 2020-06-26: 500 [IU]/h via INTRAVENOUS
  Filled 2020-06-26: qty 250

## 2020-06-26 MED ORDER — ROCURONIUM BROMIDE 10 MG/ML (PF) SYRINGE
PREFILLED_SYRINGE | INTRAVENOUS | Status: DC | PRN
  Administered 2020-06-26 (×2): 30 mg via INTRAVENOUS
  Administered 2020-06-26: 20 mg via INTRAVENOUS
  Administered 2020-06-26: 70 mg via INTRAVENOUS

## 2020-06-26 MED ORDER — ONDANSETRON HCL 4 MG/2ML IJ SOLN
INTRAMUSCULAR | Status: DC | PRN
  Administered 2020-06-26: 4 mg via INTRAVENOUS

## 2020-06-26 MED ORDER — CLOPIDOGREL BISULFATE 75 MG PO TABS
75.0000 mg | ORAL_TABLET | ORAL | Status: AC
  Administered 2020-06-26: 75 mg via ORAL
  Filled 2020-06-26: qty 1

## 2020-06-26 MED ORDER — ASPIRIN 81 MG PO CHEW
81.0000 mg | CHEWABLE_TABLET | Freq: Every day | ORAL | Status: DC
  Administered 2020-06-27: 81 mg via ORAL
  Filled 2020-06-26: qty 1

## 2020-06-26 MED ORDER — FENTANYL CITRATE (PF) 100 MCG/2ML IJ SOLN
25.0000 ug | INTRAMUSCULAR | Status: DC | PRN
  Administered 2020-06-26: 25 ug via INTRAVENOUS

## 2020-06-26 MED ORDER — OXYCODONE HCL 5 MG/5ML PO SOLN: 5.0000 mg | Freq: Once | ORAL | Status: DC | PRN

## 2020-06-26 MED ORDER — ASPIRIN 81 MG PO CHEW: 81.0000 mg | CHEWABLE_TABLET | Freq: Every day | ORAL | Status: DC

## 2020-06-26 MED ORDER — SODIUM CHLORIDE 0.9 % IV SOLN: INTRAVENOUS | Status: DC

## 2020-06-26 MED ORDER — CHLORHEXIDINE GLUCONATE CLOTH 2 % EX PADS: 6.0000 | MEDICATED_PAD | Freq: Every day | CUTANEOUS | Status: DC

## 2020-06-26 MED ORDER — FENTANYL CITRATE (PF) 100 MCG/2ML IJ SOLN
INTRAMUSCULAR | Status: DC | PRN
  Administered 2020-06-26 (×2): 50 ug via INTRAVENOUS

## 2020-06-26 MED ORDER — EPTIFIBATIDE 20 MG/10ML IV SOLN
INTRAVENOUS | Status: AC | PRN
  Administered 2020-06-26 (×2): 1.5 mg via INTRAVENOUS

## 2020-06-26 MED ORDER — PHENYLEPHRINE HCL-NACL 10-0.9 MG/250ML-% IV SOLN
INTRAVENOUS | Status: DC | PRN
  Administered 2020-06-26: 30 ug/min via INTRAVENOUS

## 2020-06-26 MED ORDER — EPHEDRINE SULFATE-NACL 50-0.9 MG/10ML-% IV SOSY
PREFILLED_SYRINGE | INTRAVENOUS | Status: DC | PRN
  Administered 2020-06-26: 5 mg via INTRAVENOUS

## 2020-06-26 MED ORDER — NITROGLYCERIN 1 MG/10 ML FOR IR/CATH LAB
INTRA_ARTERIAL | Status: AC
  Filled 2020-06-26: qty 10

## 2020-06-26 MED ORDER — CLOPIDOGREL BISULFATE 75 MG PO TABS
75.0000 mg | ORAL_TABLET | Freq: Every day | ORAL | Status: DC
  Administered 2020-06-27: 75 mg via ORAL
  Filled 2020-06-26: qty 1

## 2020-06-26 MED ORDER — PROPOFOL 10 MG/ML IV BOLUS
INTRAVENOUS | Status: DC | PRN
  Administered 2020-06-26: 120 mg via INTRAVENOUS

## 2020-06-26 MED ORDER — CLEVIDIPINE BUTYRATE 0.5 MG/ML IV EMUL
0.0000 mg/h | INTRAVENOUS | Status: DC
  Administered 2020-06-26 (×2): 2 mg/h via INTRAVENOUS
  Filled 2020-06-26 (×3): qty 50

## 2020-06-26 MED ORDER — ACETAMINOPHEN 325 MG PO TABS
650.0000 mg | ORAL_TABLET | ORAL | Status: DC | PRN
Start: 2020-06-26 — End: 2020-06-27

## 2020-06-26 MED ORDER — LIDOCAINE HCL 1 % IJ SOLN
INTRAMUSCULAR | Status: AC
  Filled 2020-06-26: qty 20

## 2020-06-26 MED ORDER — IOHEXOL 300 MG/ML  SOLN
150.0000 mL | Freq: Once | INTRAMUSCULAR | Status: AC | PRN
  Administered 2020-06-26: 100 mL via INTRAVENOUS

## 2020-06-26 MED ORDER — HEPARIN SODIUM (PORCINE) 1000 UNIT/ML IJ SOLN
INTRAMUSCULAR | Status: DC | PRN
  Administered 2020-06-26 (×2): 3000 [IU] via INTRAVENOUS

## 2020-06-26 MED ORDER — LACTATED RINGERS IV SOLN: INTRAVENOUS | Status: DC | PRN

## 2020-06-26 MED ORDER — ACETAMINOPHEN 650 MG RE SUPP: 650.0000 mg | RECTAL | Status: DC | PRN

## 2020-06-26 MED ORDER — CEFAZOLIN SODIUM-DEXTROSE 2-4 GM/100ML-% IV SOLN
2.0000 g | INTRAVENOUS | Status: AC
  Administered 2020-06-26: 2 g via INTRAVENOUS
  Filled 2020-06-26: qty 100

## 2020-06-26 MED ORDER — CHLORHEXIDINE GLUCONATE 0.12 % MT SOLN
15.0000 mL | Freq: Once | OROMUCOSAL | Status: AC
  Administered 2020-06-26: 15 mL via OROMUCOSAL
  Filled 2020-06-26: qty 15

## 2020-06-26 MED ORDER — CLEVIDIPINE BUTYRATE 0.5 MG/ML IV EMUL
INTRAVENOUS | Status: AC
  Filled 2020-06-26: qty 50

## 2020-06-26 MED ORDER — CLOPIDOGREL BISULFATE 75 MG PO TABS: 75.0000 mg | ORAL_TABLET | Freq: Every day | ORAL | Status: DC

## 2020-06-26 NOTE — Progress Notes (Signed)
ANTICOAGULATION CONSULT NOTE - Initial Consult  Pharmacy Consult for Heparin Indication: Post-interventional neuro procedure  No Known Allergies  Patient Measurements: Height: 5' 3.5" (161.3 cm) Weight: 83.9 kg (184 lb 15.5 oz) IBW/kg (Calculated) : 53.55 Heparin Dosing Weight: 73 kg Vital Signs: Temp: 98.2 F (36.8 C) (05/06 1325) Temp Source: Oral (05/06 0610) BP: 111/45 (05/06 1454) Pulse Rate: 62 (05/06 1454)  Labs: Recent Labs    06/26/20 0603  HGB 14.2  HCT 43.5  PLT 268  LABPROT 12.7  INR 1.0  CREATININE 0.90    Estimated Creatinine Clearance: 62.1 mL/min (by C-G formula based on SCr of 0.9 mg/dL).   Medical History: Past Medical History:  Diagnosis Date  . Anemia   . Carotid artery occlusion   . GERD (gastroesophageal reflux disease)   . Hearing loss    bilateral - no hearing aids  . Hyperlipidemia   . Peripheral vascular disease ([place])   . Sleep apnea    does not use cpap  . Stroke ([place])   . Vitamin B 12 deficiency   . Vitamin D deficiency     Medications:  Medications Prior to Admission  Medication Sig Dispense Refill Last Dose  . acetaminophen (TYLENOL) 500 MG tablet Take 500 mg by mouth every 6 (six) hours as needed for moderate pain or headache.   Past Week at Unknown time  . aspirin EC 81 MG tablet Take 81 mg by mouth in the morning. Swallow whole.   06/26/2020 at 0500  . atorvastatin (LIPITOR) 40 MG tablet Take 40 mg by mouth in the morning.   06/25/2020 at Unknown time  . Cholecalciferol (DIALYVITE VITAMIN D 5000) 125 MCG (5000 UT) capsule Take 5,000 Units by mouth daily.   06/25/2020 at Unknown time  . clopidogrel (PLAVIX) 75 MG tablet Take 1 tablet (75 mg total) by mouth daily with breakfast. 30 tablet 1 06/26/2020 at 0500  . Cyanocobalamin (VITAMIN B-12) 2500 MCG SUBL Take 2,500 mcg by mouth in the morning.   06/25/2020 at Unknown time  . pantoprazole (PROTONIX) 40 MG tablet Take 1 tablet (40 mg total) by mouth 2 (two) times daily. (Patient taking  differently: Take 40 mg by mouth in the morning.) 60 tablet 2 06/25/2020 at Unknown time    Assessment: 70 y.o.  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 Heparin begun in PACU at 500 units/hr - to end 5/7 0800 for sheath removal.   Goal of Therapy:  Heparin level 0.1-0.25 units/ml Monitor platelets by anticoagulation protocol: Yes   Plan:  Increase heparin to 650 units/hr  F/u 8 hr heparin level Heparin off 5/7 at 0800  Sherlon Handing, PharmD, BCPS Please see amion for complete clinical pharmacist phone list 06/26/2020,3:15 PM

## 2020-06-26 NOTE — Progress Notes (Signed)
ABI completed. Refer to "CV Proc" under chart review to view preliminary results.  06/26/2020 8:42 AM Kelby Aline., MHA, RVT, RDCS, RDMS

## 2020-06-26 NOTE — Sedation Documentation (Signed)
Groin sites closed with 8 fr perclose. Pulses dopplerable

## 2020-06-26 NOTE — Sedation Documentation (Signed)
Handoff with with Doren Custard, RN in PACU. Both femoral sites level 0 with dressing clean/dry/intact.

## 2020-06-26 NOTE — Transfer of Care (Signed)
Immediate Anesthesia Transfer of Care Note  Patient: Monica Pugh  Procedure(s) Performed: REVASCULATION (N/A )  Patient Location: PACU  Anesthesia Type:General  Level of Consciousness: awake, alert , oriented and drowsy  Airway & Oxygen Therapy: Patient Spontanous Breathing and Patient connected to nasal cannula oxygen  Post-op Assessment: Report given to RN and Post -op Vital signs reviewed and stable  Post vital signs: Reviewed and stable  Last Vitals:  Vitals Value Taken Time  BP 108/48 06/26/20 1324  Temp    Pulse 70 06/26/20 1329  Resp 20 06/26/20 1329  SpO2 97 % 06/26/20 1329  Vitals shown include unvalidated device data.  Last Pain:  Vitals:   06/26/20 0644  TempSrc:   PainSc: 0-No pain         Complications: No complications documented.

## 2020-06-26 NOTE — Procedures (Addendum)
Interventional Radiology Procedure Note  Procedure:  1) Pelvic angiogram 2) Bilateral kissing iliac artery stent placement  Findings: Please refer to procedural dictation for full description.  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  Successful deployment of bilateral kissing iliac stents (7 mm x 59 mm VBXs).  Bilateral common femoral access with 8 Fr sheaths, closed with Proglide bilaterally.  Dr. Estanislado Pandy to perform left carotid intervention after stent placement.  Complications: None immediate  Estimated Blood Loss: < 10 mL  Recommendations: Strict bedrest for 4 hours with head of bed up to no greater than 30 degrees. Continue dual antiplatelet therapy as per Neuro IR recommendations. Obtain post-procedure ABIs prior to discharge. Will follow up in IR clinic in 1 month with CTA abdomen pelvis with bilateral lower extremity runoff.   Ruthann Cancer, MD Pager: [phone]

## 2020-06-26 NOTE — Anesthesia Procedure Notes (Signed)
Arterial Line Insertion Start/End5/07/2020 8:15 AM Performed by: Trinna Post., CRNA, CRNA  Patient location: Pre-op. Preanesthetic checklist: patient identified, IV checked, site marked, risks and benefits discussed, surgical consent, monitors and equipment checked, pre-op evaluation, timeout performed and anesthesia consent Lidocaine 1% used for infiltration Left, radial was placed Catheter size: 20 G Hand hygiene performed  and maximum sterile barriers used   Attempts: 1 Procedure performed without using ultrasound guided technique. Following insertion, dressing applied and Biopatch. Post procedure assessment: normal  Patient tolerated the procedure well with no immediate complications.

## 2020-06-26 NOTE — Progress Notes (Addendum)
Per Radonna Ricker, PA-C  give Plavix, hold Nimotop. Awaiting call back to see if should give ASA 325mg .  Radonna Ricker stated to hold unless she calls me back.  She is waiting for Dr. Estanislado Pandy to say if should give ASA.  Otherwise, she stated they can give in OR.

## 2020-06-26 NOTE — Anesthesia Procedure Notes (Signed)
Procedure Name: Intubation Date/Time: 06/26/2020 9:00 AM Performed by: Trinna Post., CRNA Pre-anesthesia Checklist: Patient identified, Emergency Drugs available, Suction available, Patient being monitored and Timeout performed Patient Re-evaluated:Patient Re-evaluated prior to induction Oxygen Delivery Method: Circle system utilized Preoxygenation: Pre-oxygenation with 100% oxygen Induction Type: IV induction Ventilation: Mask ventilation without difficulty and Oral airway inserted - appropriate to patient size Laryngoscope Size: Mac and 3 Grade View: Grade I Tube type: Oral Tube size: 7.0 mm Number of attempts: 1 Airway Equipment and Method: Stylet Placement Confirmation: ETT inserted through vocal cords under direct vision,  positive ETCO2 and breath sounds checked- equal and bilateral Secured at: 22 cm Tube secured with: Tape Dental Injury: Teeth and Oropharynx as per pre-operative assessment

## 2020-06-26 NOTE — Progress Notes (Signed)
  Awakens easily, oriented to place. Only c/o some back pain. Speech and comprehension intact. No facial asymmetry. Tongue midline. Fine motor and coordination intact and symmetric. Mild right hand weakness. Right foot plantar flexion improved from immediately post procedure Common femoral artery puncture sites no look good, no bleeding, no hematoma, no pseudoaneurysm   Rolf Fells S Trumaine Wimer PA-C 06/26/2020 2:56 PM

## 2020-06-26 NOTE — Procedures (Signed)
S/P Lt common carotid artreriogram RT CFA approach. S/P revascularization of severe  Lt ICA intrastent stenosis and dissection with x 2 telescoping Wallstents and post angioplasty with minimal intrastent stenosis. Post CT Brain No  ICH  Extubated  Follows simple instruction appropriately  . Pupils 75mm RT = Lt . Mild facial symmetry. Tongue midline. Able to  to raise both arms of the bed. Mildly decreased dorsiflexion on the right. Groins soft. Distal pulses all dopplerable. Placement of  Kissing aorto femoral  stents for  severe aortofemoral  Stenosis by Dr Serafina Royals.(IR) S.Dazani Norby MD

## 2020-06-26 NOTE — Progress Notes (Signed)
Signed on to assist in care of patient at this time. Currently pt resting comfortably with eves closed. Cleviprex infusing at 12 mg/hr, Heparin drip infusing at 650 units/hr.NS infusing at 34ml/hr. Vital signs stable. No complaints of pain. 06/26/2020 @ 1902 Cyndi Bender, RN

## 2020-06-26 NOTE — H&P (Signed)
Chief Complaint: Carotid stenosis Iliac artery stenosis  Supervising Physician: Ruthann Cancer  And Deveshwar  Patient Status: [facility] - Out-pt  History of Present Illness: Monica Pugh is a 70 y.o. female who is known to our service.  She is s/p L MCA revascularization TICI 3 reperfusion and L ICA angioplasty/stent revascularization--- 05/09/2015.  Angiography done by Dr. Estanislado Pandy on 06/08/20 showed occlusion of the left internal carotid artery starting just proximal to the previous stent and severe bilateral common iliac artery stenosis.  She is here today for aortogram with iliac angiography and iliac angioplasty and stent by Dr.Suttle, then she will have carotid/cerebral angiography and stent by Dr. Estanislado Pandy.  P2Y12 = 57  She is NPO. No nausea/vomiting. No Fever/chills. ROS negative.   Past Medical History:  Diagnosis Date  . Anemia   . Carotid artery occlusion   . GERD (gastroesophageal reflux disease)   . Hearing loss    bilateral - no hearing aids  . Hyperlipidemia   . Peripheral vascular disease ([place])   . Sleep apnea    does not use cpap  . Stroke ([place])   . Vitamin B 12 deficiency   . Vitamin D deficiency     Past Surgical History:  Procedure Laterality Date  . COLONOSCOPY    . IR ANGIO INTRA EXTRACRAN SEL COM CAROTID INNOMINATE UNI R MOD SED  05/11/2020  . IR ANGIO INTRA EXTRACRAN SEL INTERNAL CAROTID UNI L MOD SED  05/11/2020  . IR ANGIO INTRA EXTRACRAN SEL INTERNAL CAROTID UNI L MOD SED  06/08/2020  . IR ANGIO VERTEBRAL SEL SUBCLAVIAN INNOMINATE BILAT MOD SED  05/11/2020  . IR ANGIOGRAM EXTREMITY BILATERAL  06/08/2020  . IR RADIOLOGIST EVAL & MGMT  05/28/2020  . IR US GUIDE VASC ACCESS RIGHT  05/11/2020  . IR US GUIDE VASC ACCESS RIGHT  06/08/2020  . RADIOLOGY WITH ANESTHESIA N/A 05/09/2015   Procedure: RADIOLOGY WITH ANESTHESIA;  Surgeon: Luanne Bras, MD;  Location: [place];  Service: Radiology;  Laterality: N/A;  . RADIOLOGY WITH ANESTHESIA N/A 06/08/2020    Procedure: IR WITH ANESTHESIA ANGIOPLASTY;  Surgeon: Luanne Bras, MD;  Location: [place];  Service: Radiology;  Laterality: N/A;  . TUBAL LIGATION      Allergies: Patient has no known allergies.  Medications: Prior to Admission medications   Medication Sig Start Date End Date Taking? Authorizing Provider  acetaminophen (TYLENOL) 500 MG tablet Take 500 mg by mouth every 6 (six) hours as needed for moderate pain or headache.   Yes [provider]  aspirin EC 81 MG tablet Take 81 mg by mouth in the morning. Swallow whole.   Yes [provider]  atorvastatin (LIPITOR) 40 MG tablet Take 40 mg by mouth in the morning.   Yes [provider]  Cholecalciferol (DIALYVITE VITAMIN D 5000) 125 MCG (5000 UT) capsule Take 5,000 Units by mouth daily.   Yes [provider]  clopidogrel (PLAVIX) 75 MG tablet Take 1 tablet (75 mg total) by mouth daily with breakfast. 05/21/15  Yes Angiulli, Lavon Paganini, PA-C  Cyanocobalamin (VITAMIN B-12) 2500 MCG SUBL Take 2,500 mcg by mouth in the morning.   Yes [provider]  pantoprazole (PROTONIX) 40 MG tablet Take 1 tablet (40 mg total) by mouth 2 (two) times daily. Patient taking differently: Take 40 mg by mouth in the morning. 05/21/15  Yes Angiulli, Lavon Paganini, PA-C     Family History  Problem Relation Age of Onset  . Cancer Mother   .  Heart attack Father     Social History   Socioeconomic History  . Marital status: Married    Spouse name: Not on file  . Number of children: Not on file  . Years of education: Not on file  . Highest education level: Not on file  Occupational History  . Not on file  Tobacco Use  . Smoking status: Current Every Day Smoker    Packs/day: 1.50    Years: 40.00    Pack years: 60.00    Types: Cigarettes  . Smokeless tobacco: Never Used  . Tobacco comment: 1-1 1/2 ppd  Vaping Use  . Vaping Use: Never used  Substance and Sexual Activity  . Alcohol use: Not Currently     Alcohol/week: 0.0 standard drinks  . Drug use: Never  . Sexual activity: Not on file  Other Topics Concern  . Not on file  Social History Narrative  . Not on file   Social Determinants of Health   Financial Resource Strain: Not on file  Food Insecurity: Not on file  Transportation Needs: Not on file  Physical Activity: Not on file  Stress: Not on file  Social Connections: Not on file     Review of Systems: A 12 point ROS discussed and pertinent positives are indicated in the HPI above.  All other systems are negative.  Review of Systems  Vital Signs: BP (!) 169/48   Pulse 74   Temp 98.3 F (36.8 C) (Oral)   Resp 17   Ht 5' 3.5" (1.613 m)   Wt 83.9 kg   LMP  (LMP Unknown)   SpO2 96%   BMI 32.25 kg/m   Physical Exam Vitals reviewed.  Constitutional:      Appearance: Normal appearance.  HENT:     Head: Normocephalic and atraumatic.  Eyes:     Extraocular Movements: Extraocular movements intact.  Cardiovascular:     Rate and Rhythm: Normal rate and regular rhythm.  Pulmonary:     Effort: Pulmonary effort is normal. No respiratory distress.     Breath sounds: Normal breath sounds.  Abdominal:     General: There is no distension.     Palpations: Abdomen is soft.     Tenderness: There is no abdominal tenderness.  Musculoskeletal:        General: Normal range of motion.  Skin:    General: Skin is warm and dry.  Neurological:     General: No focal deficit present.     Mental Status: She is alert and oriented to person, place, and time.  Psychiatric:        Mood and Affect: Mood normal.        Behavior: Behavior normal.        Thought Content: Thought content normal.        Judgment: Judgment normal.     Imaging: IR Angiogram Extremity Bilateral  Result Date: 06/09/2020 CLINICAL DATA:  Patient with recent occlusion of a previously performed endovascular revascularization of left internal carotid artery proximally with stent assisted angioplasty. Attempt at  revascularization. Patient has an occluded right internal carotid artery proximally on a chronic basis. EXAM: IR ANGIO INTRA EXTRACRAN SEL INTERNAL CAROTID UNI LEFT MOD SED COMPARISON:  Recent ultrasound of the carotids, and diagnostic arteriogram of May 11, 2020. MEDICATIONS: Heparin 3,000 units IV; Ancef 2 g IV antibiotic was administered within 1 hour of the procedure. ANESTHESIA/SEDATION: Mac anesthesia as per Department of Anesthesiology at [place]:  Isovue 300 approximately 60  mL FLUOROSCOPY TIME:  Fluoroscopy Time: 12 minutes 6 seconds (318 mGy). COMPLICATIONS: None immediate. TECHNIQUE: Informed written consent was obtained from the patient after a thorough discussion of the procedural risks, benefits and alternatives. All questions were addressed. Maximal Sterile Barrier Technique was utilized including caps, mask, sterile gowns, sterile gloves, sterile drape, hand hygiene and skin antiseptic. A timeout was performed prior to the initiation of the procedure. The right groin was prepped and draped in the usual sterile fashion. Thereafter using ultrasound guidance, and modified Seldinger technique, transfemoral access into the right common femoral artery was obtained without difficulty. Over a 0.035 inch guidewire, a 5 French Pinnacle sheath was inserted. Through this, and also over 0.035 inch guidewire, a 5 [place] JB 1 catheter was advanced to the aortic arch region and selectively positioned in the left common carotid artery. FINDINGS: Following access of the right common femoral artery, advancement of the 035 inch Roadrunner guidewire was met with resistance at the aortic bifurcation. Following crossing of the lesion, a 5 [place] Headhunter catheter was advanced and positioned distal to the aortic bifurcation and arteriogram performed at the level of the inferior mesenteric artery demonstrated severe pre occlusive disease of the right common iliac artery origin. Also noted was pre  occlusive stenosis of the distal left common carotid artery. Antegrade flow was noted distally into the right common femoral arteries bilaterally. The left common carotid arteriogram demonstrates angiographic occlusion of the left internal carotid artery starting just proximal to the previous stent. No distal reconstitution of the left internal carotid artery is seen intracranially. Through the 5 [place] JB 1 catheter, an 035 inch regular Glidewire was then gently advanced and teased using a torque device with slow progression of the wire through the entirety of the stented segment and into the left internal carotid artery distal to the stent. A 5 French diagnostic catheter was then advanced without difficulty and positioned distal to the stent. The guidewire was removed. There was good aspiration obtained from the hub of the [place] 1 catheter. A gentle control arteriogram performed through the diagnostic catheter demonstrated brisk antegrade flow intracranially without discrete filling defects to suggest clot formation. Flow was noted into the left internal carotid terminus, and into the left anterior and left middle cerebral arteries. An 014 inch 300 cm Zoom exchange guidewire was then advanced and the diagnostic catheter was removed. A 5 French Pinnacle sheath was then removed. Introduction of an 8 [place] sheath was met with significant resistance at the site of the severe pre occlusive stenosis of the right common iliac artery origin. The 5 French Pinnacle sheath was reintroduced and the exchange micro guidewire was removed. The 5 [place] diagnostic catheter was advanced into the left common carotid artery and arteriogram performed at the origin of the left common carotid artery now demonstrated angiographic channel through the occluded left internal carotid artery proximal stent with flow noted briskly more distally to the cranial skull base. The procedure was terminated. IMPRESSION: Status post mechanical  thrombolysis of left internal carotid artery proximal stent with antegrade flow noted to the left internal carotid terminus. Severe pre occlusive stenosis of the left common iliac artery at its origin, and the distal left common iliac artery. PLAN: Findings were reviewed with the patient and spouse. Case was also discussed with Dr Serafina Royals of Interventional Radiology. It was decided to proceed with bilateral common iliac artery kissing stents followed by left internal carotid artery revascularization under general anesthesia. This has been scheduled for this  Friday a.m. Electronically Signed   By: Luanne Bras M.D.   On: 06/08/2020 14:34   IR US Guide Vasc Access Right  Result Date: 06/09/2020 CLINICAL DATA:  Patient with recent occlusion of a previously performed endovascular revascularization of left internal carotid artery proximally with stent assisted angioplasty. Attempt at revascularization. Patient has an occluded right internal carotid artery proximally on a chronic basis. EXAM: IR ANGIO INTRA EXTRACRAN SEL INTERNAL CAROTID UNI LEFT MOD SED COMPARISON:  Recent ultrasound of the carotids, and diagnostic arteriogram of May 11, 2020. MEDICATIONS: Heparin 3,000 units IV; Ancef 2 g IV antibiotic was administered within 1 hour of the procedure. ANESTHESIA/SEDATION: Mac anesthesia as per Department of Anesthesiology at [place]:  Isovue 300 approximately 60 mL FLUOROSCOPY TIME:  Fluoroscopy Time: 12 minutes 6 seconds (318 mGy). COMPLICATIONS: None immediate. TECHNIQUE: Informed written consent was obtained from the patient after a thorough discussion of the procedural risks, benefits and alternatives. All questions were addressed. Maximal Sterile Barrier Technique was utilized including caps, mask, sterile gowns, sterile gloves, sterile drape, hand hygiene and skin antiseptic. A timeout was performed prior to the initiation of the procedure. The right groin was prepped and draped in  the usual sterile fashion. Thereafter using ultrasound guidance, and modified Seldinger technique, transfemoral access into the right common femoral artery was obtained without difficulty. Over a 0.035 inch guidewire, a 5 French Pinnacle sheath was inserted. Through this, and also over 0.035 inch guidewire, a 5 [place] JB 1 catheter was advanced to the aortic arch region and selectively positioned in the left common carotid artery. FINDINGS: Following access of the right common femoral artery, advancement of the 035 inch Roadrunner guidewire was met with resistance at the aortic bifurcation. Following crossing of the lesion, a 5 [place] Headhunter catheter was advanced and positioned distal to the aortic bifurcation and arteriogram performed at the level of the inferior mesenteric artery demonstrated severe pre occlusive disease of the right common iliac artery origin. Also noted was pre occlusive stenosis of the distal left common carotid artery. Antegrade flow was noted distally into the right common femoral arteries bilaterally. The left common carotid arteriogram demonstrates angiographic occlusion of the left internal carotid artery starting just proximal to the previous stent. No distal reconstitution of the left internal carotid artery is seen intracranially. Through the 5 [place] JB 1 catheter, an 035 inch regular Glidewire was then gently advanced and teased using a torque device with slow progression of the wire through the entirety of the stented segment and into the left internal carotid artery distal to the stent. A 5 French diagnostic catheter was then advanced without difficulty and positioned distal to the stent. The guidewire was removed. There was good aspiration obtained from the hub of the [place] 1 catheter. A gentle control arteriogram performed through the diagnostic catheter demonstrated brisk antegrade flow intracranially without discrete filling defects to suggest clot formation. Flow was  noted into the left internal carotid terminus, and into the left anterior and left middle cerebral arteries. An 014 inch 300 cm Zoom exchange guidewire was then advanced and the diagnostic catheter was removed. A 5 French Pinnacle sheath was then removed. Introduction of an 8 [place] sheath was met with significant resistance at the site of the severe pre occlusive stenosis of the right common iliac artery origin. The 5 French Pinnacle sheath was reintroduced and the exchange micro guidewire was removed. The 5 French diagnostic catheter was advanced into the left common carotid artery  and arteriogram performed at the origin of the left common carotid artery now demonstrated angiographic channel through the occluded left internal carotid artery proximal stent with flow noted briskly more distally to the cranial skull base. The procedure was terminated. IMPRESSION: Status post mechanical thrombolysis of left internal carotid artery proximal stent with antegrade flow noted to the left internal carotid terminus. Severe pre occlusive stenosis of the left common iliac artery at its origin, and the distal left common iliac artery. PLAN: Findings were reviewed with the patient and spouse. Case was also discussed with Dr Serafina Royals of Interventional Radiology. It was decided to proceed with bilateral common iliac artery kissing stents followed by left internal carotid artery revascularization under general anesthesia. This has been scheduled for this Friday a.m. Electronically Signed   By: Luanne Bras M.D.   On: 06/08/2020 14:34   IR Radiologist Eval & Mgmt  Result Date: 05/28/2020 EXAM: ESTABLISHED PATIENT OFFICE VISIT CHIEF COMPLAINT: Discuss results of the recent diagnostic catheter arteriogram. Current Pain Level: 1-10 HISTORY OF PRESENT ILLNESS: Patient is a 69 year old right handed lady who returns today accompanied by her husband to discuss the findings of a recent diagnostic catheter arteriogram performed a few  days ago. The patient is status post endovascular revascularization of occluded dominant superior division of the left middle cerebral artery with a Solitaire retriever achieving a TICI 3 reperfusion followed by endovascular revascularization of occluded left internal carotid artery with stent assisted angioplasty on 05/13/2015. The patient has subsequently been follow-up with CT of the head and neck in February of 2021 which showed no change in the known right common carotid artery occlusion. Stented segment of the left internal carotid artery proximally and the left common carotid artery was widely patent with good antegrade flow intracranially. Ultrasound of the carotids on 04/30/2020 demonstrated suspicion of pre occlusion. Diagnostic catheter arteriogram performed on 05/11/2020 demonstrated complete occlusion of the left internal carotid artery proximal stent. Patient reports occasional episodes of bilateral visual blurring but without diplopia, or symptoms of amaurosis fugax, or complete blindness. She reports no other symptoms of near syncope, seizure-like activity, passing out spells, perioral numbness, incoordination, or with gait instability. No difficulty swallowing liquids or solids. The patient is able to use her computer at home without any difficulty as per patient and her husband. The angiographic findings were reviewed with the patient and her husband. Options considered were those of continued surveillance with the patient strongly advised to stop smoking. The second option was to attempt at revascularization of the occluded left internal carotid artery at the bulb given the recent nature of the occlusion by ultrasound and diagnostic catheter arteriogram. The revascularization attempt was reviewed in detail. Success rate at revascularization would be 50-60%. Procedure would be under general anesthesia. Risk of 1-2% chance of a new stroke was also discussed. Questions were answered to their  satisfaction. The patient and spouse expressed understanding of the management options. They stated that they would discuss the options and get back with [place] regarding the option they would like to proceed with in the next few days. They were encouraged to call should they have any concerns or questions. Past Medical History: As per recent H and P Medications: As per recent H and P Allergies: As per recent H and P Social History: As per recent H and P Family History:  As per recent H and P REVIEW OF SYSTEMS: Negative unless as mentioned above. PHYSICAL EXAMINATION: Neurologically grossly intact except for patient with known history of  chronic hearing loss in both ears. ASSESSMENT AND PLAN: See above. Electronically Signed   By: Luanne Bras M.D.   On: 05/27/2020 14:53   IR ANGIO INTRA EXTRACRAN SEL INTERNAL CAROTID UNI L MOD SED  Result Date: 06/09/2020 CLINICAL DATA:  Patient with recent occlusion of a previously performed endovascular revascularization of left internal carotid artery proximally with stent assisted angioplasty. Attempt at revascularization. Patient has an occluded right internal carotid artery proximally on a chronic basis. EXAM: IR ANGIO INTRA EXTRACRAN SEL INTERNAL CAROTID UNI LEFT MOD SED COMPARISON:  Recent ultrasound of the carotids, and diagnostic arteriogram of May 11, 2020. MEDICATIONS: Heparin 3,000 units IV; Ancef 2 g IV antibiotic was administered within 1 hour of the procedure. ANESTHESIA/SEDATION: Mac anesthesia as per Department of Anesthesiology at [place]:  Isovue 300 approximately 60 mL FLUOROSCOPY TIME:  Fluoroscopy Time: 12 minutes 6 seconds (318 mGy). COMPLICATIONS: None immediate. TECHNIQUE: Informed written consent was obtained from the patient after a thorough discussion of the procedural risks, benefits and alternatives. All questions were addressed. Maximal Sterile Barrier Technique was utilized including caps, mask, sterile gowns, sterile  gloves, sterile drape, hand hygiene and skin antiseptic. A timeout was performed prior to the initiation of the procedure. The right groin was prepped and draped in the usual sterile fashion. Thereafter using ultrasound guidance, and modified Seldinger technique, transfemoral access into the right common femoral artery was obtained without difficulty. Over a 0.035 inch guidewire, a 5 French Pinnacle sheath was inserted. Through this, and also over 0.035 inch guidewire, a 5 [place] JB 1 catheter was advanced to the aortic arch region and selectively positioned in the left common carotid artery. FINDINGS: Following access of the right common femoral artery, advancement of the 035 inch Roadrunner guidewire was met with resistance at the aortic bifurcation. Following crossing of the lesion, a 5 [place] Headhunter catheter was advanced and positioned distal to the aortic bifurcation and arteriogram performed at the level of the inferior mesenteric artery demonstrated severe pre occlusive disease of the right common iliac artery origin. Also noted was pre occlusive stenosis of the distal left common carotid artery. Antegrade flow was noted distally into the right common femoral arteries bilaterally. The left common carotid arteriogram demonstrates angiographic occlusion of the left internal carotid artery starting just proximal to the previous stent. No distal reconstitution of the left internal carotid artery is seen intracranially. Through the 5 [place] JB 1 catheter, an 035 inch regular Glidewire was then gently advanced and teased using a torque device with slow progression of the wire through the entirety of the stented segment and into the left internal carotid artery distal to the stent. A 5 French diagnostic catheter was then advanced without difficulty and positioned distal to the stent. The guidewire was removed. There was good aspiration obtained from the hub of the [place] 1 catheter. A gentle control  arteriogram performed through the diagnostic catheter demonstrated brisk antegrade flow intracranially without discrete filling defects to suggest clot formation. Flow was noted into the left internal carotid terminus, and into the left anterior and left middle cerebral arteries. An 014 inch 300 cm Zoom exchange guidewire was then advanced and the diagnostic catheter was removed. A 5 French Pinnacle sheath was then removed. Introduction of an 8 [place] sheath was met with significant resistance at the site of the severe pre occlusive stenosis of the right common iliac artery origin. The 5 French Pinnacle sheath was reintroduced and the exchange micro guidewire was  removed. The 5 [place] diagnostic catheter was advanced into the left common carotid artery and arteriogram performed at the origin of the left common carotid artery now demonstrated angiographic channel through the occluded left internal carotid artery proximal stent with flow noted briskly more distally to the cranial skull base. The procedure was terminated. IMPRESSION: Status post mechanical thrombolysis of left internal carotid artery proximal stent with antegrade flow noted to the left internal carotid terminus. Severe pre occlusive stenosis of the left common iliac artery at its origin, and the distal left common iliac artery. PLAN: Findings were reviewed with the patient and spouse. Case was also discussed with Dr Serafina Royals of Interventional Radiology. It was decided to proceed with bilateral common iliac artery kissing stents followed by left internal carotid artery revascularization under general anesthesia. This has been scheduled for this Friday a.m. Electronically Signed   By: Luanne Bras M.D.   On: 06/08/2020 14:34    Labs:  CBC: Recent Labs    05/11/20 0645 06/08/20 0744  WBC 9.7 8.6  HGB 15.9* 15.0  HCT 48.6* 46.0  PLT 226 263    COAGS: Recent Labs    05/11/20 0815 06/08/20 0744 06/26/20 0603  INR 1.0 1.0 1.0     BMP: Recent Labs    05/11/20 0645 06/08/20 0744  NA 138 140  K 4.0 3.9  CL 103 105  CO2 29 27  GLUCOSE 94 97  BUN 6* 7*  CALCIUM 8.8* 9.2  CREATININE 0.88 0.88  GFRNONAA >60 >60    LIVER FUNCTION TESTS: No results for input(s): BILITOT, AST, ALT, ALKPHOS, PROT, ALBUMIN in the last 8760 hours.  TUMOR MARKERS: No results for input(s): AFPTM, CEA, CA199, CHROMGRNA in the last 8760 hours.  Assessment and Plan:  Left carotid artery stenosis and bilateral iliac artery stenosis.  Will proceed with aortogram with iliac angiography and iliac angioplasty and stent by Dr.Suttle, then she will have carotid/cerebral angiography and stent by Dr. Estanislado Pandy.  Risks and benefits of aortogram/ iliac angiography and angoiplasty/stent and cerebral angiography with stent were discussed with the patient including,but not limited to bleeding, infection, vascular injury, contrast induced renal failure, stroke or even death.  This interventional procedure involves the use of X-rays and because of the nature of the planned procedure, it is possible that we will have prolonged use of X-ray fluoroscopy.  Potential radiation risks to you include (but are not limited to) the following: - A slightly elevated risk for cancer  several years later in life. This risk is typically less than 0.5% percent. This risk is low in comparison to the normal incidence of human cancer, which is 33% for women and 50% for men according to the [place]. - Radiation induced injury can include skin redness, resembling a rash, tissue breakdown / ulcers and hair loss (which can be temporary or permanent).   The likelihood of either of these occurring depends on the difficulty of the procedure and whether you are sensitive to radiation due to previous procedures, disease, or genetic conditions.   IF your procedure requires a prolonged use of radiation, you will be notified and given written instructions for  further action.  It is your responsibility to monitor the irradiated area for the 2 weeks following the procedure and to notify your physician if you are concerned that you have suffered a radiation induced injury.    All of the patient's questions were answered, patient is agreeable to proceed.  Consent signed and in chart.  Electronically Signed: Murrell Redden, PA-C   06/26/2020, 7:21 AM      I spent a total of    25 Minutes in face to face in clinical consultation, greater than 50% of which was counseling/coordinating care for iliac stent and carotid stent.

## 2020-06-27 LAB — CBC WITH DIFFERENTIAL/PLATELET
Abs Immature Granulocytes: 0.04 10*3/uL (ref 0.00–0.07)
Basophils Absolute: 0 10*3/uL (ref 0.0–0.1)
Basophils Relative: 0 %
Eosinophils Absolute: 0 10*3/uL (ref 0.0–0.5)
Eosinophils Relative: 0 %
HCT: 34.5 % — ABNORMAL LOW (ref 36.0–46.0)
Hemoglobin: 11.4 g/dL — ABNORMAL LOW (ref 12.0–15.0)
Immature Granulocytes: 0 %
Lymphocytes Relative: 15 %
Lymphs Abs: 1.8 10*3/uL (ref 0.7–4.0)
MCH: 31.9 pg (ref 26.0–34.0)
MCHC: 33 g/dL (ref 30.0–36.0)
MCV: 96.6 fL (ref 80.0–100.0)
Monocytes Absolute: 0.7 10*3/uL (ref 0.1–1.0)
Monocytes Relative: 6 %
Neutro Abs: 9.8 10*3/uL — ABNORMAL HIGH (ref 1.7–7.7)
Neutrophils Relative %: 79 %
Platelets: 225 10*3/uL (ref 150–400)
RBC: 3.57 MIL/uL — ABNORMAL LOW (ref 3.87–5.11)
RDW: 14.6 % (ref 11.5–15.5)
WBC: 12.4 10*3/uL — ABNORMAL HIGH (ref 4.0–10.5)
nRBC: 0 % (ref 0.0–0.2)

## 2020-06-27 LAB — BASIC METABOLIC PANEL
Anion gap: 4 — ABNORMAL LOW (ref 5–15)
BUN: 7 mg/dL — ABNORMAL LOW (ref 8–23)
CO2: 26 mmol/L (ref 22–32)
Calcium: 7.8 mg/dL — ABNORMAL LOW (ref 8.9–10.3)
Chloride: 107 mmol/L (ref 98–111)
Creatinine, Ser: 0.87 mg/dL (ref 0.44–1.00)
GFR, Estimated: >60 mL/min (ref >60)
Glucose, Bld: 115 mg/dL — ABNORMAL HIGH (ref 70–99)
Potassium: 4.3 mmol/L (ref 3.5–5.1)
Sodium: 137 mmol/L (ref 135–145)

## 2020-06-27 LAB — HEPARIN LEVEL (UNFRACTIONATED): Heparin Unfractionated: <0.1 IU/mL — ABNORMAL LOW (ref 0.30–0.70)

## 2020-06-27 MED ORDER — HEPARIN (PORCINE) 25000 UT/250ML-% IV SOLN
750.0000 [IU]/h | INTRAVENOUS | Status: DC
  Filled 2020-06-27: qty 250

## 2020-06-27 NOTE — Evaluation (Signed)
Physical Therapy Evaluation Patient Details Name: Monica Pugh MRN: [id number] DOB: 1950/11/16 Today's Date: 06/27/2020   History of Present Illness  The pt is a 70 yo female presenting 5/6 s/p aortogram with iliac angiography and iliac angioplasty and stent as well as carotid angiography and stent placement. PMH includes: anemia, bilateral hearing loss, HLD, PVD, and stroke.    Clinical Impression  Pt in bed upon arrival of PT, agreeable to evaluation at this time. Prior to admission the pt was completely independent with all mobility and ADLs, living with her husband in a home with level entry and no stairs. The pt now presents with minor limitations in functional mobility and endurance, but was able to demo great stability with transfers and gait without assist or use of DME. The pt was on RA at rest at the start of session with SPO2 ranging from 87-92%, the pt denies any SOB or discomfort. The pt was able to tolerate ambulation on RA with SpO2 maintained 88-94% on RA with cues for deep breathing. SpO2 did drop at times, but when SPO2 low, the pleth was poor and suspect low quality reading. The pt reported no distress or SOB with any mobility or activity, and reports she is at her baseline level of mobility. No further acute PT needs identified, pt will be safe to return home with family assist when medically cleared.   SpO2 on RA with rest and mobility maintained 88-94% with good pleth and no reports of SOB or distress from pt.      Follow Up Recommendations No PT follow up    Equipment Recommendations  None recommended by PT    Recommendations for Other Services       Precautions / Restrictions Precautions Precautions: Other (comment) Precaution Comments: watch SpO2 Restrictions Weight Bearing Restrictions: No      Mobility  Bed Mobility Overal bed mobility: Independent             General bed mobility comments: pt OOB in recliner at start and end of session     Transfers Overall transfer level: Independent Equipment used: None             General transfer comment: no assist or AD, pt able to stand without any evidence of instability  Ambulation/Gait Ambulation/Gait assistance: Supervision Gait Distance (Feet): 150 Feet Assistive device: None Gait Pattern/deviations: Step-through pattern;Decreased stride length Gait velocity: 0.4 m/s Gait velocity interpretation: <1.31 ft/sec, indicative of household ambulator General Gait Details: slowed gait, but steady without UE support or assist. pt on RA with SpO2 88%-92% with good pleth     Balance Overall balance assessment: No apparent balance deficits (not formally assessed)                                           Pertinent Vitals/Pain Pain Assessment: No/denies pain    Home Living Family/patient expects to be discharged to:: Private residence Living Arrangements: Spouse/significant other Available Help at Discharge: Family;Available 24 hours/day Type of Home: House Home Access: Level entry     Home Layout: One level Home Equipment: None      Prior Function Level of Independence: Independent         Comments: pt fully independent with mobility in the home, never used AD     Hand Dominance        Extremity/Trunk Assessment   Upper Extremity Assessment Upper  Extremity Assessment: Overall WFL for tasks assessed    Lower Extremity Assessment Lower Extremity Assessment: Overall WFL for tasks assessed    Cervical / Trunk Assessment Cervical / Trunk Assessment: Normal  Communication   Communication: HOH  Cognition Arousal/Alertness: Awake/alert Behavior During Therapy: Flat affect Overall Cognitive Status: Within Functional Limits for tasks assessed                                 General Comments: pt able to follow all commands she hears, pt husband present and confirms all home set-up and PLOF      General Comments General  comments (skin integrity, edema, etc.): SpO2 with significant variation through session, by end of session pt with good pleth on RA with mobility and rest, SpO2 88-95%    Exercises     Assessment/Plan    PT Assessment Patent does not need any further PT services  PT Problem List         PT Treatment Interventions      PT Goals (Current goals can be found in the Care Plan section)  Acute Rehab PT Goals Patient Stated Goal: return home PT Goal Formulation: With patient Time For Goal Achievement: 07/04/20 Potential to Achieve Goals: Good     AM-PAC PT "6 Clicks" Mobility  Outcome Measure Help needed turning from your back to your side while in a flat bed without using bedrails?: None Help needed moving from lying on your back to sitting on the side of a flat bed without using bedrails?: None Help needed moving to and from a bed to a chair (including a wheelchair)?: None Help needed standing up from a chair using your arms (e.g., wheelchair or bedside chair)?: None Help needed to walk in hospital room?: None Help needed climbing 3-5 steps with a railing? : A Little 6 Click Score: 23    End of Session Equipment Utilized During Treatment: Gait belt;Oxygen Activity Tolerance: Patient tolerated treatment well Patient left: in chair;with call bell/phone within reach;with family/visitor present Nurse Communication: Mobility status PT Visit Diagnosis: Other abnormalities of gait and mobility (R26.89)    Time: 1104-1130 PT Time Calculation (min) (ACUTE ONLY): 26 min   Charges:   PT Evaluation $PT Eval Low Complexity: 1 Low PT Treatments $Gait Training: 8-22 mins        Karma Ganja, PT, DPT   Acute Rehabilitation Department Pager #: [phone]  Otho Bellows 06/27/2020, 11:45 AM

## 2020-06-27 NOTE — Discharge Summary (Addendum)
Patient ID: Monica Pugh MRN: [id number] DOB/AGE: [birth year] 70 y.o.  Admit date: 06/26/2020 Discharge date: 06/27/2020  Supervising Physician: Luanne Bras  Patient Status: [facility] - In-pt  Admission Diagnoses: Bilateral iliac stenosis; L ICA atenosis  Discharge Diagnoses:  Active Problems:   Internal carotid artery occlusion, left   Discharged Condition: stable  Hospital Course:  Previous CVA: L ICA angioplasty/stent and revascularization 05/09/2015 Follow up arteriogram 06/08/20 revealed occlusion L ICA just proximal to previous stent - also noting Bilateral common iliac artery stenosis. Pt underwent Bilat iliac artery stent placements in IR 5/6 with Dr Serafina Royals then L ICA intrastent stenosis and dissection with x 2 telescoping Wallstents and post angioplasty with minimal intrastent stenosis. She was admitted overnight into Neuro ICU. Did well overnight; no call and no issues. Denies headache this am; denies pain; speech or vision changes. Denies dizzy or numbness/tingling. UOP good- yellow Dr Estanislado Pandy has seen and evaluated pt. Plan is send home today. Noted is 02 sats at 86-88% RA--- will have PT/OT for evaluation of pt before DC today Evaluation done-- No home needs per PT/OT   Consults: None  Significant Diagnostic Studies: Cerebral arteriogram; Aortagram  Treatments: Procedure:  1) Pelvic angiogram 2) Bilateral kissing iliac artery stent placement  S/P Lt common carotid artreriogram RT CFA approach. S/P revascularization of severe  Lt ICA intrastent stenosis and dissection with x 2 telescoping Wallstents and post angioplasty with minimal intrastent stenosis.    Discharge Exam: Blood pressure (!) 123/59, pulse 80, temperature 98.2 F (36.8 C), temperature source Oral, resp. rate (!) 21, height 5' 3.5" (1.613 m), weight 184 lb 15.5 oz (83.9 kg), SpO2 93 %.  A/O In NAD [place] Face symmetrical Tongue midline Follows all commands Heart: RRR Lungs: CTA   86-88% RA Abd: soft- no masses Ext: FROM; Bilat groin sites: NT no bleeding; no hematoma  Results for orders placed or performed during the hospital encounter of 06/26/20  MRSA PCR Screening   Specimen: Nasal Mucosa; Nasopharyngeal  Result Value Ref Range   MRSA by PCR NEGATIVE NEGATIVE  CBC WITH DIFFERENTIAL  Result Value Ref Range   WBC 9.0 4.0 - 10.5 K/uL   RBC 4.45 3.87 - 5.11 MIL/uL   Hemoglobin 14.2 12.0 - 15.0 g/dL   HCT 43.5 36.0 - 46.0 %   MCV 97.8 80.0 - 100.0 fL   MCH 31.9 26.0 - 34.0 pg   MCHC 32.6 30.0 - 36.0 g/dL   RDW 14.6 11.5 - 15.5 %   Platelets 268 150 - 400 K/uL   nRBC 0.0 0.0 - 0.2 %   Neutrophils Relative % 62 %   Neutro Abs 5.5 1.7 - 7.7 K/uL   Lymphocytes Relative 27 %   Lymphs Abs 2.4 0.7 - 4.0 K/uL   Monocytes Relative 9 %   Monocytes Absolute 0.8 0.1 - 1.0 K/uL   Eosinophils Relative 1 %   Eosinophils Absolute 0.1 0.0 - 0.5 K/uL   Basophils Relative 1 %   Basophils Absolute 0.1 0.0 - 0.1 K/uL   Immature Granulocytes 0 %   Abs Immature Granulocytes 0.03 0.00 - 0.07 K/uL  Protime-INR  Result Value Ref Range   Prothrombin Time 12.7 11.4 - 15.2 seconds   INR 1.0 0.8 - 1.2  Basic metabolic panel  Result Value Ref Range   Sodium 138 135 - 145 mmol/L   Potassium 3.8 3.5 - 5.1 mmol/L   Chloride 105 98 - 111 mmol/L   CO2 26 22 - 32 mmol/L  Glucose, Bld 105 (H) 70 - 99 mg/dL   BUN 7 (L) 8 - 23 mg/dL   Creatinine, Ser 0.90 0.44 - 1.00 mg/dL   Calcium 9.1 8.9 - 10.3 mg/dL   GFR, Estimated >60 >60 mL/min   Anion gap 7 5 - 15  Platelet inhibition p2y12 (not at [facility])  Result Value Ref Range   Platelet Function  P2Y12 57 (L) 182 - 335 PRU  Heparin level (unfractionated)  Result Value Ref Range   Heparin Unfractionated <0.10 (L) 0.30 - 0.70 IU/mL  CBC with Differential/Platelet  Result Value Ref Range   WBC 12.4 (H) 4.0 - 10.5 K/uL   RBC 3.57 (L) 3.87 - 5.11 MIL/uL   Hemoglobin 11.4 (L) 12.0 - 15.0 g/dL   HCT 34.5 (L) 36.0 - 46.0 %   MCV 96.6  80.0 - 100.0 fL   MCH 31.9 26.0 - 34.0 pg   MCHC 33.0 30.0 - 36.0 g/dL   RDW 14.6 11.5 - 15.5 %   Platelets 225 150 - 400 K/uL   nRBC 0.0 0.0 - 0.2 %   Neutrophils Relative % 79 %   Neutro Abs 9.8 (H) 1.7 - 7.7 K/uL   Lymphocytes Relative 15 %   Lymphs Abs 1.8 0.7 - 4.0 K/uL   Monocytes Relative 6 %   Monocytes Absolute 0.7 0.1 - 1.0 K/uL   Eosinophils Relative 0 %   Eosinophils Absolute 0.0 0.0 - 0.5 K/uL   Basophils Relative 0 %   Basophils Absolute 0.0 0.0 - 0.1 K/uL   Immature Granulocytes 0 %   Abs Immature Granulocytes 0.04 0.00 - 0.07 K/uL  Basic metabolic panel  Result Value Ref Range   Sodium 137 135 - 145 mmol/L   Potassium 4.3 3.5 - 5.1 mmol/L   Chloride 107 98 - 111 mmol/L   CO2 26 22 - 32 mmol/L   Glucose, Bld 115 (H) 70 - 99 mg/dL   BUN 7 (L) 8 - 23 mg/dL   Creatinine, Ser 0.87 0.44 - 1.00 mg/dL   Calcium 7.8 (L) 8.9 - 10.3 mg/dL   GFR, Estimated >60 >60 mL/min   Anion gap 4 (L) 5 - 15  Type and screen [place]  Result Value Ref Range   ABO/RH(D) O POS    Antibody Screen NEG    Sample Expiration      06/29/2020,2359 Performed at [facility], [address] [place], [place] [address]   ABO/Rh  Result Value Ref Range   ABO/RH(D)      O POS Performed at [facility] [address], [place], [place] [address]      Disposition:   Bilateral iliac stent placement in IR with Dr Serafina Royals L ICA angioplasty/stent placement in IR with Dr Estanislado Pandy Pt has done well overnight 02 sats 86-88%on RA--- will have PT/OT evaluation before DC home No home needs per PT/OT Pt to continue all home meds ASA/Plavix daily STOP SMOKING She is aware of follow up with Dr Estanislado Pandy 2 weeks---- scheduler will call pt with time and date Husband also aware of plan and has good understnading  Discharge Instructions    Call MD for:  difficulty breathing, headache or visual disturbances   Complete by: As directed    Call MD for:  extreme fatigue    Complete by: As directed    Call MD for:  hives   Complete by: As directed    Call MD for:  persistant dizziness or light-headedness  Complete by: As directed    Call MD for:  persistant nausea and vomiting   Complete by: As directed    Call MD for:  redness, tenderness, or signs of infection (pain, swelling, redness, odor or green/yellow discharge around incision site)   Complete by: As directed    Call MD for:  severe uncontrolled pain   Complete by: As directed    Call MD for:  temperature >100.4   Complete by: As directed    Diet - low sodium heart healthy   Complete by: As directed    Discharge instructions   Complete by: As directed    Continue all home meds; pt will hear from IR scheduler for 2 week follow up with Dr Estanislado Pandy; STOP SMOKING   Discharge wound care:   Complete by: As directed    Keep Bilat groin sites [place] dry; change bandaid daily x 1 week; may shower today   Driving Restrictions   Complete by: As directed    No driving x 2 weeks   Increase activity slowly   Complete by: As directed    Lifting restrictions   Complete by: As directed    No lifting over 10 lbs 2 weeks     Allergies as of 06/27/2020   No Known Allergies     Medication List    TAKE these medications   acetaminophen 500 MG tablet Commonly known as: TYLENOL Take 500 mg by mouth every 6 (six) hours as needed for moderate pain or headache.   aspirin EC 81 MG tablet Take 81 mg by mouth in the morning. Swallow whole.   atorvastatin 40 MG tablet Commonly known as: LIPITOR Take 40 mg by mouth in the morning.   clopidogrel 75 MG tablet Commonly known as: PLAVIX Take 1 tablet (75 mg total) by mouth daily with breakfast.   Dialyvite Vitamin D 5000 125 MCG (5000 UT) capsule Generic drug: Cholecalciferol Take 5,000 Units by mouth daily.   pantoprazole 40 MG tablet Commonly known as: PROTONIX Take 1 tablet (40 mg total) by mouth 2 (two) times daily. What changed: when to take  this   Vitamin B-12 2500 MCG Subl Take 2,500 mcg by mouth in the morning.            Discharge Care Instructions  (From admission, onward)         Start     Ordered   06/27/20 0000  Discharge wound care:       Comments: Keep Bilat groin sites [place] dry; change bandaid daily x 1 week; may shower today   06/27/20 0835          Follow-up Information    Luanne Bras, MD Follow up in 2 week(s).   Specialties: Interventional Radiology, Radiology Why: follow up with Dr Estanislado Pandy 2 weeks---- call [phone] if any questions--- pt will hear from scheduler for follow up time and date Contact information: [place] [address] [phone]        Bonnita Nasuti, MD Follow up.   Specialty: Internal Medicine Contact information: [address] [place] [address] [phone]        Suzette Battiest, MD Follow up in 2 week(s).   Specialties: Interventional Radiology, Diagnostic Radiology, Radiology Why: pt will hear from [facility] for follow up with Dr Serafina Royals- call [phone] if any questions Contact information: [place] [address] [phone]  Electronically Signed: Lavonia Drafts, PA-C 06/27/2020, 12:00 PM   I have spent Greater Than 30 Minutes discharging [place].

## 2020-06-27 NOTE — Progress Notes (Signed)
Discharge instructions given. IVs removed. Patient taken to car via wheelchair with all belongings.

## 2020-06-27 NOTE — Progress Notes (Signed)
[place] for Heparin Indication: Post-interventional neuro procedure  No Known Allergies  Patient Measurements: Height: 5' 3.5" (161.3 cm) Weight: 83.9 kg (184 lb 15.5 oz) IBW/kg (Calculated) : 53.55 Heparin Dosing Weight: 73 kg Vital Signs: Temp: 98.4 F (36.9 C) (05/07 0000) Temp Source: Axillary (05/07 0000) BP: 131/48 (05/06 2000) Pulse Rate: 78 (05/06 2000)  Labs: Recent Labs    06/26/20 0603 06/27/20 0039  HGB 14.2 11.4*  HCT 43.5 34.5*  PLT 268 225  LABPROT 12.7  --   INR 1.0  --   HEPARINUNFRC  --  <0.10*  CREATININE 0.90 0.87    Estimated Creatinine Clearance: 64.2 mL/min (by C-G formula based on SCr of 0.87 mg/dL).   Medical History: Past Medical History:  Diagnosis Date  . Anemia   . Carotid artery occlusion   . GERD (gastroesophageal reflux disease)   . Hearing loss    bilateral - no hearing aids  . Hyperlipidemia   . Peripheral vascular disease ([place])   . Sleep apnea    does not use cpap  . Stroke ([place])   . Vitamin B 12 deficiency   . Vitamin D deficiency     Medications:  Medications Prior to Admission  Medication Sig Dispense Refill Last Dose  . acetaminophen (TYLENOL) 500 MG tablet Take 500 mg by mouth every 6 (six) hours as needed for moderate pain or headache.   Past Week at Unknown time  . aspirin EC 81 MG tablet Take 81 mg by mouth in the morning. Swallow whole.   06/26/2020 at 0500  . atorvastatin (LIPITOR) 40 MG tablet Take 40 mg by mouth in the morning.   06/25/2020 at Unknown time  . Cholecalciferol (DIALYVITE VITAMIN D 5000) 125 MCG (5000 UT) capsule Take 5,000 Units by mouth daily.   06/25/2020 at Unknown time  . clopidogrel (PLAVIX) 75 MG tablet Take 1 tablet (75 mg total) by mouth daily with breakfast. 30 tablet 1 06/26/2020 at 0500  . Cyanocobalamin (VITAMIN B-12) 2500 MCG SUBL Take 2,500 mcg by mouth in the morning.   06/25/2020 at Unknown time  . pantoprazole (PROTONIX) 40 MG tablet Take 1 tablet  (40 mg total) by mouth 2 (two) times daily. (Patient taking differently: Take 40 mg by mouth in the morning.) 60 tablet 2 06/25/2020 at Unknown time    Assessment: 70 y.o.  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 Heparin begun in PACU at 500 units/hr - to end 5/7 0800 for sheath removal.   5/7 AM update:  Heparin level undetectable  Goal of Therapy:  Heparin level 0.1-0.25 units/ml Monitor platelets by anticoagulation protocol: Yes   Plan:  Increase heparin to 750 units/hr  Heparin off 5/7 at [place], PharmD, [place] Pharmacist Phone: [phone]

## 2020-06-29 ENCOUNTER — Encounter (HOSPITAL_COMMUNITY): Payer: Self-pay | Admitting: Interventional Radiology

## 2020-06-29 ENCOUNTER — Other Ambulatory Visit: Payer: Self-pay | Admitting: Interventional Radiology

## 2020-06-29 DIAGNOSIS — I771 Stricture of artery: Secondary | ICD-10-CM

## 2020-06-29 NOTE — Anesthesia Postprocedure Evaluation (Signed)
Anesthesia Post Note  Patient: Monica Pugh  Procedure(s) Performed: REVASCULATION (N/A )     Patient location during evaluation: PACU Anesthesia Type: General Level of consciousness: awake and alert Pain management: pain level controlled Vital Signs Assessment: post-procedure vital signs reviewed and stable Respiratory status: spontaneous breathing, nonlabored ventilation, respiratory function stable and patient connected to nasal cannula oxygen Cardiovascular status: blood pressure returned to baseline and stable Postop Assessment: no apparent nausea or vomiting Anesthetic complications: no   No complications documented.  Last Vitals:  Vitals:   06/27/20 1130 06/27/20 1200  BP:  122/81  Pulse:  78  Resp:  (!) 26  Temp:    SpO2: 93% 92%    Last Pain:  Vitals:   06/27/20 1200  TempSrc:   PainSc: 0-No pain                 Merlinda Frederick

## 2020-07-03 ENCOUNTER — Other Ambulatory Visit (HOSPITAL_COMMUNITY): Payer: Self-pay | Admitting: Interventional Radiology

## 2020-07-03 DIAGNOSIS — I63239 Cerebral infarction due to unspecified occlusion or stenosis of unspecified carotid arteries: Secondary | ICD-10-CM

## 2020-07-10 ENCOUNTER — Ambulatory Visit (HOSPITAL_COMMUNITY): Admission: RE | Admit: 2020-07-10 | Payer: Medicare HMO | Source: Ambulatory Visit

## 2020-07-10 ENCOUNTER — Other Ambulatory Visit: Payer: Self-pay

## 2020-07-10 ENCOUNTER — Ambulatory Visit (HOSPITAL_COMMUNITY)
Admission: RE | Admit: 2020-07-10 | Discharge: 2020-07-10 | Disposition: A | Discharge status: Home / Self Care | Payer: Medicare HMO | Source: Ambulatory Visit | Attending: Interventional Radiology | Admitting: Interventional Radiology

## 2020-07-10 DIAGNOSIS — I771 Stricture of artery: Secondary | ICD-10-CM | POA: Diagnosis not present

## 2020-07-10 NOTE — Progress Notes (Signed)
ABI study completed.   Please see CV Proc for preliminary results.   Darlin Coco, RDMS, RVT

## 2020-07-13 ENCOUNTER — Telehealth (HOSPITAL_COMMUNITY): Payer: Self-pay

## 2020-07-13 NOTE — Telephone Encounter (Signed)
Called to reschedule follow-up with Dr. Estanislado Pandy, no answer, left vm. AW

## 2020-07-14 ENCOUNTER — Other Ambulatory Visit: Payer: Self-pay

## 2020-07-14 ENCOUNTER — Ambulatory Visit (HOSPITAL_COMMUNITY)
Admission: RE | Admit: 2020-07-14 | Discharge: 2020-07-14 | Disposition: A | Discharge status: Home / Self Care | Payer: Medicare HMO | Source: Ambulatory Visit | Attending: Interventional Radiology | Admitting: Interventional Radiology

## 2020-07-14 DIAGNOSIS — I771 Stricture of artery: Secondary | ICD-10-CM | POA: Diagnosis present

## 2020-07-14 MED ORDER — IOHEXOL 350 MG/ML SOLN
100.0000 mL | Freq: Once | INTRAVENOUS | Status: AC | PRN
  Administered 2020-07-14: 100 mL via INTRAVENOUS

## 2020-07-14 MED ORDER — SODIUM CHLORIDE (PF) 0.9 % IJ SOLN
INTRAMUSCULAR | Status: AC
  Filled 2020-07-14: qty 50

## 2020-07-15 ENCOUNTER — Encounter: Payer: Self-pay | Admitting: *Deleted

## 2020-07-15 ENCOUNTER — Ambulatory Visit
Admission: RE | Admit: 2020-07-15 | Discharge: 2020-07-15 | Disposition: A | Discharge status: Home / Self Care | Payer: Medicare HMO | Source: Ambulatory Visit | Attending: Radiology | Admitting: Radiology

## 2020-07-15 DIAGNOSIS — I771 Stricture of artery: Secondary | ICD-10-CM

## 2020-07-15 HISTORY — PX: IR RADIOLOGIST EVAL & MGMT: IMG5224

## 2020-07-15 NOTE — Progress Notes (Signed)
Referring Physician(s): Dr. Juliet Rude  Chief Complaint: The patient is seen in follow up today s/p kissing bilateral common iliac stent placement on 06/26/20  She presents today along with her husband, Chrissie Noa, via telephone visit.  History of present illness: 69 year old female with history of stroke, carotid artery stenosis, and Rutherford 3 peripheral artery disease.  Attempted prior carotid stenting thwarted by severe right common iliac artery stenosis.  Underwent kissing bilateral common iliac stent placement on 07/31/46 in concert with Dr. Estanislado Pandy who performed carotid stent relining at the same time.    Prior to the procedure, she reports 3 years of severe bilateral lower extremity cramps in the thighs and calves which prevented ambulation.  She reports since the procedure her cramping has totally resolved.  She has no limitations with ambulation.  She continues to take aspirin and Plavix.    ABIs were performed yesterday which have improved to 0.97 on the right (previously 0.67) and 1.03 on the left (previously 0.60).  CTA with runoff was also performed which demonstrates patent stents and widely patent outflow and runoff vessels.    She does complain of some bilateral (right worse than left) ankle swelling which has been present for a while prior to the recent procedures.  She has been wearing compression stockings recently which has improved the swelling some.  No discoloration or wounds are present.  No cramps, some swelling but unchanged.  Able to walk, significantly improved.    Past Medical History:  Diagnosis Date  . Anemia   . Carotid artery occlusion   . GERD (gastroesophageal reflux disease)   . Hearing loss    bilateral - no hearing aids  . Hyperlipidemia   . Peripheral vascular disease ([place])   . Sleep apnea    does not use cpap  . Stroke ([place])   . Vitamin B 12 deficiency   . Vitamin D deficiency     Past Surgical History:  Procedure Laterality Date  .  COLONOSCOPY    . IR ANGIO INTRA EXTRACRAN SEL COM CAROTID INNOMINATE UNI R MOD SED  05/11/2020  . IR ANGIO INTRA EXTRACRAN SEL INTERNAL CAROTID UNI L MOD SED  05/11/2020  . IR ANGIO INTRA EXTRACRAN SEL INTERNAL CAROTID UNI L MOD SED  06/08/2020  . IR ANGIO INTRA EXTRACRAN SEL INTERNAL CAROTID UNI L MOD SED  06/26/2020  . IR ANGIO VERTEBRAL SEL SUBCLAVIAN INNOMINATE BILAT MOD SED  05/11/2020  . IR ANGIOGRAM EXTREMITY BILATERAL  06/08/2020  . IR CT HEAD LTD  06/26/2020  . IR ILIAC ART STENT INC PTA MOD SED  06/26/2020  . IR ILIAC ART STENT INC PTA MOD SED  06/26/2020  . IR INTRAVSC STENT CERV CAROTID W/EMB-PROT MOD SED INCL ANGIO  06/26/2020  . IR IVUS EACH ADDITIONAL NON CORONARY VESSEL  06/26/2020  . IR RADIOLOGIST EVAL & MGMT  05/28/2020  . IR US GUIDE VASC ACCESS LEFT  06/26/2020  . IR US GUIDE VASC ACCESS RIGHT  05/11/2020  . IR US GUIDE VASC ACCESS RIGHT  06/08/2020  . IR US GUIDE VASC ACCESS RIGHT  06/26/2020  . RADIOLOGY WITH ANESTHESIA N/A 05/09/2015   Procedure: RADIOLOGY WITH ANESTHESIA;  Surgeon: Luanne Bras, MD;  Location: [facility];  Service: Radiology;  Laterality: N/A;  . RADIOLOGY WITH ANESTHESIA N/A 06/08/2020   Procedure: IR WITH ANESTHESIA ANGIOPLASTY;  Surgeon: Luanne Bras, MD;  Location: [facility];  Service: Radiology;  Laterality: N/A;  . RADIOLOGY WITH ANESTHESIA N/A 06/26/2020   Procedure:  REVASCULATION;  Surgeon: Luanne Bras, MD;  Location: [place];  Service: Radiology;  Laterality: N/A;  . TUBAL LIGATION      Allergies: Patient has no known allergies.  Medications: Prior to Admission medications   Medication Sig Start Date End Date Taking? Authorizing Provider  acetaminophen (TYLENOL) 500 MG tablet Take 500 mg by mouth every 6 (six) hours as needed for moderate pain or headache.    [provider]  aspirin EC 81 MG tablet Take 81 mg by mouth in the morning. Swallow whole.    [provider]  atorvastatin (LIPITOR) 40 MG tablet Take 40 mg by mouth in the  morning.    [provider]  Cholecalciferol (DIALYVITE VITAMIN D 5000) 125 MCG (5000 UT) capsule Take 5,000 Units by mouth daily.    [provider]  clopidogrel (PLAVIX) 75 MG tablet Take 1 tablet (75 mg total) by mouth daily with breakfast. 05/21/15   Angiulli, Lavon Paganini, PA-C  Cyanocobalamin (VITAMIN B-12) 2500 MCG SUBL Take 2,500 mcg by mouth in the morning.    [provider]  pantoprazole (PROTONIX) 40 MG tablet Take 1 tablet (40 mg total) by mouth 2 (two) times daily. Patient taking differently: Take 40 mg by mouth in the morning. 05/21/15   Angiulli, Lavon Paganini, PA-C     Family History  Problem Relation Age of Onset  . Cancer Mother   . Heart attack Father     Social History   Socioeconomic History  . Marital status: Married    Spouse name: Not on file  . Number of children: Not on file  . Years of education: Not on file  . Highest education level: Not on file  Occupational History  . Not on file  Tobacco Use  . Smoking status: Current Every Day Smoker    Packs/day: 1.50    Years: 40.00    Pack years: 60.00    Types: Cigarettes  . Smokeless tobacco: Never Used  . Tobacco comment: 1-1 1/2 ppd  Vaping Use  . Vaping Use: Never used  Substance and Sexual Activity  . Alcohol use: Not Currently    Alcohol/week: 0.0 standard drinks  . Drug use: Never  . Sexual activity: Not on file  Other Topics Concern  . Not on file  Social History Narrative  . Not on file   Social Determinants of Health   Financial Resource Strain: Not on file  Food Insecurity: Not on file  Transportation Needs: Not on file  Physical Activity: Not on file  Stress: Not on file  Social Connections: Not on file     Vital Signs: LMP  (LMP Unknown)   No physical examination performed in lieu of clinic visit.  Imaging: CTA Runoff (07/14/20): IMPRESSION: VASCULAR  1. Status post kissing bilateral common iliac artery stent placement without complicating features. 2.  Possible tiny focal dissection flap versus focal fibrofatty plaque about the posterior aspect of the distal right common femoral artery. Recommend attention on follow-up. 3. The remaining bilateral inflow, outflow, and runoff vessels are widely patent. 4. Small right groin hematoma just superficial to the inguinal ligament measuring up to 2.3 cm. 5. Aortic Atherosclerosis (ICD10-I70.0).  NON-VASCULAR  1. Small hiatal hernia. 2. Mild colonic diverticulosis, no evidence of diverticulitis.     ABIs (07/14/20):   Labs:  CBC: Recent Labs    05/11/20 0645 06/08/20 0744 06/26/20 0603 06/27/20 0039  WBC 9.7 8.6 9.0 12.4*  HGB 15.9* 15.0 14.2 11.4*  HCT 48.6* 46.0  43.5 34.5*  PLT 226 263 268 225    COAGS: Recent Labs    05/11/20 0815 06/08/20 0744 06/26/20 0603  INR 1.0 1.0 1.0    BMP: Recent Labs    05/11/20 0645 06/08/20 0744 06/26/20 0603 06/27/20 0039  NA 138 140 138 137  K 4.0 3.9 3.8 4.3  CL 103 105 105 107  CO2 29 27 26 26   GLUCOSE 94 97 105* 115*  BUN 6* 7* 7* 7*  CALCIUM 8.8* 9.2 9.1 7.8*  CREATININE 0.88 0.88 0.90 0.87  GFRNONAA >60 >60 >60 >60    LIVER FUNCTION TESTS: No results for input(s): BILITOT, AST, ALT, ALKPHOS, PROT, ALBUMIN in the last 8760 hours.  Assessment and Plan: 70 year old female with history of advanced aortoiliac atherosclerotic disease resulting in Rutherford 3 bilateral lower extremity peripheral artery disease, now status post bilateral common iliac stent placement on 06/26/20 with complete resolution of her symptoms, normalization of ABIs, and widely patent stents, inflow, outflow, and runoff vessels on CTA.    -continue dual antiplatelet therapy or as per Dr. Estanislado Pandy -follow up in IR clinic in 3 months or sooner if symptoms return -plan for 1 year repeat ABIs   Electronically Signed: Rosanne Ashing Keniah Klemmer 07/15/2020, 10:09 AM   I spent a total of 15 Minutes in telephone clinical consultation, greater than 50% of which was  counseling/coordinating care for peripheral artery disease.

## 2020-07-24 ENCOUNTER — Ambulatory Visit (HOSPITAL_COMMUNITY)
Admission: RE | Admit: 2020-07-24 | Discharge: 2020-07-24 | Disposition: A | Discharge status: Home / Self Care | Payer: Medicare HMO | Source: Ambulatory Visit | Attending: Radiology | Admitting: Radiology

## 2020-07-24 ENCOUNTER — Other Ambulatory Visit: Payer: Self-pay

## 2020-07-24 DIAGNOSIS — I6522 Occlusion and stenosis of left carotid artery: Secondary | ICD-10-CM

## 2020-07-27 HISTORY — PX: IR RADIOLOGIST EVAL & MGMT: IMG5224

## 2021-02-01 ENCOUNTER — Telehealth (HOSPITAL_COMMUNITY): Payer: Self-pay

## 2021-02-01 NOTE — Telephone Encounter (Signed)
Called to schedule us carotid, no answer, no vm. AW 

## 2021-05-25 ENCOUNTER — Other Ambulatory Visit: Payer: Self-pay | Admitting: Interventional Radiology

## 2021-05-25 DIAGNOSIS — I771 Stricture of artery: Secondary | ICD-10-CM

## 2021-06-03 ENCOUNTER — Encounter (HOSPITAL_COMMUNITY): Payer: Medicare HMO

## 2021-06-03 ENCOUNTER — Other Ambulatory Visit: Payer: Self-pay | Admitting: *Deleted

## 2021-06-03 DIAGNOSIS — I6523 Occlusion and stenosis of bilateral carotid arteries: Secondary | ICD-10-CM

## 2021-06-07 ENCOUNTER — Telehealth (HOSPITAL_COMMUNITY): Payer: Self-pay

## 2021-06-07 ENCOUNTER — Ambulatory Visit (INDEPENDENT_AMBULATORY_CARE_PROVIDER_SITE_OTHER)
Admission: RE | Admit: 2021-06-07 | Discharge: 2021-06-07 | Disposition: A | Discharge status: Home / Self Care | Payer: Medicare HMO | Source: Ambulatory Visit | Attending: Surgery | Admitting: Surgery

## 2021-06-07 ENCOUNTER — Other Ambulatory Visit: Payer: Self-pay | Admitting: Surgery

## 2021-06-07 ENCOUNTER — Ambulatory Visit: Payer: Medicare HMO | Admitting: Surgery

## 2021-06-07 ENCOUNTER — Other Ambulatory Visit (HOSPITAL_COMMUNITY): Payer: Self-pay | Admitting: Interventional Radiology

## 2021-06-07 ENCOUNTER — Encounter: Payer: Self-pay | Admitting: Surgery

## 2021-06-07 ENCOUNTER — Ambulatory Visit (HOSPITAL_COMMUNITY)
Admission: RE | Admit: 2021-06-07 | Discharge: 2021-06-07 | Disposition: A | Discharge status: Home / Self Care | Payer: Medicare HMO | Source: Ambulatory Visit | Attending: Surgery | Admitting: Surgery

## 2021-06-07 VITALS — BP 144/81 | HR 71 | Temp 98.0°F | Resp 20 | Ht 63.5 in | Wt 189.0 lb

## 2021-06-07 DIAGNOSIS — I6523 Occlusion and stenosis of bilateral carotid arteries: Secondary | ICD-10-CM

## 2021-06-07 DIAGNOSIS — I739 Peripheral vascular disease, unspecified: Secondary | ICD-10-CM

## 2021-06-07 DIAGNOSIS — I70213 Atherosclerosis of native arteries of extremities with intermittent claudication, bilateral legs: Secondary | ICD-10-CM | POA: Diagnosis not present

## 2021-06-07 DIAGNOSIS — I6529 Occlusion and stenosis of unspecified carotid artery: Secondary | ICD-10-CM

## 2021-06-07 NOTE — Telephone Encounter (Signed)
Called to schedule angiogram, no answer, no vm. AW  

## 2021-06-07 NOTE — Progress Notes (Signed)
? ?[facility] ? ?Patient name: Monica Pugh MRN: [id number] DOB: [birth year] Sex: female ? ? ?REQUESTING PROVIDER:  ? ? Dr. Gwenlyn Found ? ? ?REASON FOR CONSULT:  ?  ?Vascular medication  ? ?HISTORY OF PRESENT ILLNESS:  ? ?Monica Pugh is a 71 y.o. female, who is followed by Dr. Oneida Alar for carotid disease.  She has a history of stroke in 2017.  At that time she underwent clot retrieval and left internal carotid stent placement by interventional radiology.  She has a known right carotid occlusion.  There was concern over possible recurrent stenosis of the left carotid artery, however this was not found when a CT angiogram was performed in 2021.  On 07/02/2020, she underwent repeat stenting for recurrent stenosis which was symptomatic.  This was performed by Dr. Estanislado Pandy.. ? ?She is now getting ready to have a hysterectomy for endometrial cancer and needs to come off of antiplatelet therapy.  She does take a statin for hypercholesterolemia.  She is a smoker. ? ? ? ?PAST MEDICAL HISTORY  ? ? ?Past Medical History:  ?Diagnosis Date  ? Anemia   ? Carotid artery occlusion   ? GERD (gastroesophageal reflux disease)   ? Hearing loss   ? bilateral - no hearing aids  ? Hyperlipidemia   ? Peripheral vascular disease ([place])   ? Sleep apnea   ? does not use cpap  ? Stroke [facility])   ? Vitamin B 12 deficiency   ? Vitamin D deficiency   ? ? ? ?FAMILY HISTORY  ? ?Family History  ?Problem Relation Age of Onset  ? Cancer Mother   ? Heart attack Father   ? ? ?SOCIAL HISTORY:  ? ?Social History  ? ?Socioeconomic History  ? Marital status: Married  ?  Spouse name: Not on file  ? Number of children: Not on file  ? Years of education: Not on file  ? Highest education level: Not on file  ?Occupational History  ? Not on file  ?Tobacco Use  ? Smoking status: Every Day  ?  Packs/day: 0.50  ?  Years: 40.00  ?  Pack years: 20.00  ?  Types: Cigarettes  ? Smokeless tobacco: Never  ? Tobacco comments:   ?  1-1 1/2 ppd  ?Vaping Use  ? Vaping Use: Never used  ?Substance and Sexual Activity  ? Alcohol use: Not Currently  ?  Alcohol/week: 0.0 standard drinks  ? Drug use: Never  ? Sexual activity: Not on file  ?Other Topics Concern  ? Not on file  ?Social History Narrative  ? Not on file  ? ?Social Determinants of Health  ? ?Financial Resource Strain: Not on file  ?Food Insecurity: Not on file  ?Transportation Needs: Not on file  ?Physical Activity: Not on file  ?Stress: Not on file  ?Social Connections: Not on file  ?Intimate Partner Violence: Not on file  ? ? ?ALLERGIES:  ? ? ?No Known Allergies ? ?CURRENT MEDICATIONS:  ? ? ?Current Outpatient Medications  ?Medication Sig Dispense Refill  ? acetaminophen (TYLENOL) 500 MG tablet Take 500 mg by mouth every 6 (six) hours as needed for moderate pain or headache.    ? aspirin EC 81 MG tablet Take 81 mg by mouth in the morning. Swallow whole.    ? atorvastatin (LIPITOR) 40 MG tablet Take 40 mg by mouth in the morning.    ? Cholecalciferol (DIALYVITE VITAMIN D 5000) 125 MCG (5000 UT) capsule Take 5,000 Units by mouth daily.    ?  clopidogrel (PLAVIX) 75 MG tablet Take 1 tablet (75 mg total) by mouth daily with breakfast. 30 tablet 1  ? Cyanocobalamin (VITAMIN B-12) 2500 MCG SUBL Take 2,500 mcg by mouth in the morning.    ? pantoprazole (PROTONIX) 40 MG tablet Take 1 tablet (40 mg total) by mouth 2 (two) times daily. (Patient taking differently: Take 40 mg by mouth in the morning.) 60 tablet 2  ? ?No current facility-administered medications for this visit.  ? ? ?REVIEW OF SYSTEMS:  ? ?'[X]'$  denotes positive finding, '[ ]'$  denotes negative finding ?Cardiac  Comments:  ?Chest pain or chest pressure:    ?Shortness of breath upon exertion:    ?Short of breath when lying flat:    ?Irregular heart rhythm:    ?    ?Vascular    ?Pain in calf, thigh, or hip brought on by ambulation:    ?Pain in feet at night that wakes you up from your sleep:     ?Blood clot in your veins:    ?Leg  swelling:     ?    ?Pulmonary    ?Oxygen at home:    ?Productive cough:     ?Wheezing:     ?    ?Neurologic    ?Sudden weakness in arms or legs:     ?Sudden numbness in arms or legs:     ?Sudden onset of difficulty speaking or slurred speech:    ?Temporary loss of vision in one eye:     ?Problems with dizziness:     ?    ?Gastrointestinal    ?Blood in stool:     ? ?Vomited blood:     ?    ?Genitourinary    ?Burning when urinating:     ?Blood in urine:    ?    ?Psychiatric    ?Major depression:     ?    ?Hematologic    ?Bleeding problems:    ?Problems with blood clotting too easily:    ?    ?Skin    ?Rashes or ulcers:    ?    ?Constitutional    ?Fever or chills:    ? ?PHYSICAL EXAM:  ? ?Vitals:  ? 06/07/21 1355  ?BP: (!) 144/81  ?Pulse: 71  ?Resp: 20  ?Temp: 98 ?F (36.7 ?C)  ?SpO2: 98%  ?Weight: 189 lb (85.7 kg)  ?Height: 5' 3.5" (1.613 m)  ? ? ?GENERAL: The patient is a well-nourished female, in no acute distress. The vital signs are documented above. ?CARDIAC: There is a regular rate and rhythm.  ?VASCULAR: Palpable left dorsalis pedis pulse, faintly palpable right ?PULMONARY: Nonlabored respirations ?ABDOMEN: Soft and non-tender with normal pitched bowel sounds.  ?MUSCULOSKELETAL: There are no major deformities or cyanosis. ?NEUROLOGIC: No focal weakness or paresthesias are detected. ?SKIN: There are no ulcers or rashes noted. ?PSYCHIATRIC: The patient has a normal affect. ? ?STUDIES:  ? ?Reviewed the following studies: ?Carotid: ?Right Carotid: Evidence consistent with a total occlusion of the right  ?ICA. The  ?               CCA appears occluded.  ? ?Left Carotid: >75% stenosis in the stent at the area of the native  ?bifurcation.  ?              ECA appears to be fec via a branch.  ? ?Vertebrals:  Bilateral vertebral arteries demonstrate antegrade flow.  ?Subclavians: Normal flow hemodynamics were seen in bilateral subclavian  ?  arteries.   ? ?+-------+-----------+-----------+------------+------------+  ?ABI/TBIToday's ABIToday's TBIPrevious ABIPrevious TBI  ?+-------+-----------+-----------+------------+------------+  ?Right  1.08       0.46       0.97        0.58          ?+-------+-----------+-----------+------------+------------+  ?Left   1.28       0.93       1.03        0.57          ?+-------+-----------+-----------+------------+------------+  ? ?   ? ?ASSESSMENT and PLAN  ? ?PAD: Status post kissing iliac stents by Dr. Serafina Royals of interventional radiology.  Good blood flow to both feet and no symptoms.  ABIs will be repeated in 1 year ? ?Carotid: The patient is asymptomatic however ultrasound does show a high-grade stenosis on the left, greater than 75%.  Before stopping antiplatelet medications, I think this needs to be confirmed so that she is not at risk for stroke during surgery.  I spoke with Dr. Estanislado Pandy, and he is going to work on getting her scheduled for cerebral angiography this week to help determine whether or not it is safe to come off of antiplatelet therapy ? ? ?Annamarie Major, IV, MD, FACS ?[facility] ?Tel [phone] ?Pager [phone]  ?

## 2021-06-08 ENCOUNTER — Other Ambulatory Visit: Payer: Self-pay | Admitting: Radiology

## 2021-06-09 ENCOUNTER — Other Ambulatory Visit (HOSPITAL_COMMUNITY): Payer: Self-pay | Admitting: Interventional Radiology

## 2021-06-09 ENCOUNTER — Ambulatory Visit (HOSPITAL_COMMUNITY)
Admission: RE | Admit: 2021-06-09 | Discharge: 2021-06-09 | Disposition: A | Discharge status: Home / Self Care | Payer: Medicare HMO | Source: Ambulatory Visit | Attending: Interventional Radiology | Admitting: Interventional Radiology

## 2021-06-09 ENCOUNTER — Other Ambulatory Visit: Payer: Self-pay

## 2021-06-09 DIAGNOSIS — F17210 Nicotine dependence, cigarettes, uncomplicated: Secondary | ICD-10-CM | POA: Insufficient documentation

## 2021-06-09 DIAGNOSIS — C541 Malignant neoplasm of endometrium: Secondary | ICD-10-CM | POA: Diagnosis not present

## 2021-06-09 DIAGNOSIS — Z7902 Long term (current) use of antithrombotics/antiplatelets: Secondary | ICD-10-CM | POA: Insufficient documentation

## 2021-06-09 DIAGNOSIS — I6523 Occlusion and stenosis of bilateral carotid arteries: Secondary | ICD-10-CM | POA: Insufficient documentation

## 2021-06-09 DIAGNOSIS — I6529 Occlusion and stenosis of unspecified carotid artery: Secondary | ICD-10-CM

## 2021-06-09 DIAGNOSIS — Z7982 Long term (current) use of aspirin: Secondary | ICD-10-CM | POA: Diagnosis not present

## 2021-06-09 DIAGNOSIS — I739 Peripheral vascular disease, unspecified: Secondary | ICD-10-CM | POA: Diagnosis not present

## 2021-06-09 DIAGNOSIS — Z8673 Personal history of transient ischemic attack (TIA), and cerebral infarction without residual deficits: Secondary | ICD-10-CM | POA: Insufficient documentation

## 2021-06-09 DIAGNOSIS — I6522 Occlusion and stenosis of left carotid artery: Secondary | ICD-10-CM | POA: Diagnosis present

## 2021-06-09 HISTORY — PX: IR US GUIDE VASC ACCESS RIGHT: IMG2390

## 2021-06-09 HISTORY — PX: IR ANGIO INTRA EXTRACRAN SEL INTERNAL CAROTID UNI L MOD SED: IMG5361

## 2021-06-09 LAB — CBC
HCT: 30.3 % — ABNORMAL LOW (ref 36.0–46.0)
Hemoglobin: 9.6 g/dL — ABNORMAL LOW (ref 12.0–15.0)
MCH: 30.1 pg (ref 26.0–34.0)
MCHC: 31.7 g/dL (ref 30.0–36.0)
MCV: 95 fL (ref 80.0–100.0)
Platelets: 328 K/uL (ref 150–400)
RBC: 3.19 MIL/uL — ABNORMAL LOW (ref 3.87–5.11)
RDW: 14 % (ref 11.5–15.5)
WBC: 8.5 K/uL (ref 4.0–10.5)
nRBC: 0 % (ref 0.0–0.2)

## 2021-06-09 LAB — BASIC METABOLIC PANEL
Anion gap: 9 (ref 5–15)
BUN: 11 mg/dL (ref 8–23)
CO2: 25 mmol/L (ref 22–32)
Calcium: 8.9 mg/dL (ref 8.9–10.3)
Chloride: 105 mmol/L (ref 98–111)
Creatinine, Ser: 1.25 mg/dL — ABNORMAL HIGH (ref 0.44–1.00)
GFR, Estimated: 47 mL/min — ABNORMAL LOW (ref >60)
Glucose, Bld: 89 mg/dL (ref 70–99)
Potassium: 3.9 mmol/L (ref 3.5–5.1)
Sodium: 139 mmol/L (ref 135–145)

## 2021-06-09 LAB — PROTIME-INR
INR: 1 (ref 0.8–1.2)
Prothrombin Time: 13.1 seconds (ref 11.4–15.2)

## 2021-06-09 MED ORDER — VERAPAMIL HCL 2.5 MG/ML IV SOLN
INTRAVENOUS | Status: AC
  Filled 2021-06-09: qty 2

## 2021-06-09 MED ORDER — ACETAMINOPHEN 325 MG PO TABS
650.0000 mg | ORAL_TABLET | Freq: Once | ORAL | Status: AC
  Administered 2021-06-09: 650 mg via ORAL
  Filled 2021-06-09: qty 2

## 2021-06-09 MED ORDER — FENTANYL CITRATE (PF) 100 MCG/2ML IJ SOLN
INTRAMUSCULAR | Status: AC
  Filled 2021-06-09: qty 2

## 2021-06-09 MED ORDER — HEPARIN SODIUM (PORCINE) 1000 UNIT/ML IJ SOLN
INTRAMUSCULAR | Status: AC
  Filled 2021-06-09: qty 10

## 2021-06-09 MED ORDER — FENTANYL CITRATE (PF) 100 MCG/2ML IJ SOLN
INTRAMUSCULAR | Status: AC | PRN
  Administered 2021-06-09: 25 ug via INTRAVENOUS

## 2021-06-09 MED ORDER — NITROGLYCERIN 1 MG/10 ML FOR IR/CATH LAB
INTRA_ARTERIAL | Status: AC | PRN
  Administered 2021-06-09: 200 ug via INTRA_ARTERIAL

## 2021-06-09 MED ORDER — MIDAZOLAM HCL 2 MG/2ML IJ SOLN
INTRAMUSCULAR | Status: AC | PRN
  Administered 2021-06-09: 1 mg via INTRAVENOUS

## 2021-06-09 MED ORDER — ACETAMINOPHEN 325 MG PO TABS
ORAL_TABLET | ORAL | Status: AC
  Filled 2021-06-09: qty 2

## 2021-06-09 MED ORDER — SODIUM CHLORIDE 0.9 % IV SOLN: INTRAVENOUS | Status: AC

## 2021-06-09 MED ORDER — IOHEXOL 300 MG/ML  SOLN: 100.0000 mL | Freq: Once | INTRAMUSCULAR | Status: DC | PRN

## 2021-06-09 MED ORDER — LIDOCAINE HCL 1 % IJ SOLN
INTRAMUSCULAR | Status: AC
  Filled 2021-06-09: qty 20

## 2021-06-09 MED ORDER — MIDAZOLAM HCL 2 MG/2ML IJ SOLN
INTRAMUSCULAR | Status: AC
  Filled 2021-06-09: qty 2

## 2021-06-09 MED ORDER — NITROGLYCERIN 1 MG/10 ML FOR IR/CATH LAB
INTRA_ARTERIAL | Status: AC
  Filled 2021-06-09: qty 10

## 2021-06-09 MED ORDER — SODIUM CHLORIDE 0.9 % IV SOLN: Freq: Once | INTRAVENOUS | Status: AC

## 2021-06-09 MED ORDER — VERAPAMIL HCL 2.5 MG/ML IV SOLN: INTRA_ARTERIAL | Status: AC | PRN

## 2021-06-09 NOTE — H&P (Signed)
? ?Chief Complaint: ?Patient was seen in consultation today for diagnostic cerebral angiogram ? ?Referring Physician(s): ?ENIDPOEUM,PNTIRWE ? ?Supervising Physician: Luanne Bras ? ?Patient Status: [facility] - Out-pt ? ?History of Present Illness: ?Tymira Horkey is a 71 y.o. female with a medical history significant for stroke, carotid artery stenosis and advanced aortoiliac atherosclerotic disease resulting in bilateral lower extremity peripheral artery disease. She is familiar to IR from a left MCA revascularization and left ICA angioplasty stent 05/09/15. She has also had several angiograms to evaluate left ICA occlusions and iliac artery stenosis. On 06/26/20 she underwent kissing bilateral common iliac artery stent placement by Dr. Serafina Royals and carotid stent relining by Dr. Estanislado Pandy.  ? ?The patient experienced resolution of many of her symptoms and she remains on dual antiplatelet therapy with aspirin and plavix. She now has unfortunately developed endometrial cancer and needs to have a hysterectomy which would require holding her antiplatelet medications. A recent carotid ultrasound showed high-grade stenosis in the left carotid artery greater than 75%. Interventional Radiology has been asked to evaluate this patient for an image-guided cerebral angiogram to confirm this finding and evaluate the patient's stroke risk off antiplatelet therapy.   ? ?Past Medical History:  ?Diagnosis Date  ? Anemia   ? Carotid artery occlusion   ? GERD (gastroesophageal reflux disease)   ? Hearing loss   ? bilateral - no hearing aids  ? Hyperlipidemia   ? Peripheral vascular disease ([place])   ? Sleep apnea   ? does not use cpap  ? Stroke [facility])   ? Vitamin B 12 deficiency   ? Vitamin D deficiency   ? ? ?Past Surgical History:  ?Procedure Laterality Date  ? COLONOSCOPY    ? IR ANGIO INTRA EXTRACRAN SEL COM CAROTID INNOMINATE UNI R MOD SED  05/11/2020  ? IR ANGIO INTRA EXTRACRAN SEL INTERNAL CAROTID UNI L MOD SED  05/11/2020  ? IR ANGIO  INTRA EXTRACRAN SEL INTERNAL CAROTID UNI L MOD SED  06/08/2020  ? IR ANGIO INTRA EXTRACRAN SEL INTERNAL CAROTID UNI L MOD SED  06/26/2020  ? IR ANGIO VERTEBRAL SEL SUBCLAVIAN INNOMINATE BILAT MOD SED  05/11/2020  ? IR ANGIOGRAM EXTREMITY BILATERAL  06/08/2020  ? IR CT HEAD LTD  06/26/2020  ? IR ILIAC ART STENT INC PTA MOD SED  06/26/2020  ? IR ILIAC ART STENT INC PTA MOD SED  06/26/2020  ? IR INTRAVSC STENT CERV CAROTID W/EMB-PROT MOD SED INCL ANGIO  06/26/2020  ? IR IVUS EACH ADDITIONAL NON CORONARY VESSEL  06/26/2020  ? IR RADIOLOGIST EVAL & MGMT  05/28/2020  ? IR RADIOLOGIST EVAL & MGMT  07/15/2020  ? IR RADIOLOGIST EVAL & MGMT  07/27/2020  ? IR US GUIDE VASC ACCESS LEFT  06/26/2020  ? IR US GUIDE VASC ACCESS RIGHT  05/11/2020  ? IR US GUIDE VASC ACCESS RIGHT  06/08/2020  ? IR US GUIDE VASC ACCESS RIGHT  06/26/2020  ? RADIOLOGY WITH ANESTHESIA N/A 05/09/2015  ? Procedure: RADIOLOGY WITH ANESTHESIA;  Surgeon: Luanne Bras, MD;  Location: [place];  Service: Radiology;  Laterality: N/A;  ? RADIOLOGY WITH ANESTHESIA N/A 06/08/2020  ? Procedure: IR WITH ANESTHESIA ANGIOPLASTY;  Surgeon: Luanne Bras, MD;  Location: [place];  Service: Radiology;  Laterality: N/A;  ? RADIOLOGY WITH ANESTHESIA N/A 06/26/2020  ? Procedure: REVASCULATION;  Surgeon: Luanne Bras, MD;  Location: [place];  Service: Radiology;  Laterality: N/A;  ? TUBAL LIGATION    ? ? ?Allergies: ?Patient has no known allergies. ? ?Medications: ?Prior  to Admission medications   ?Medication Sig Start Date End Date Taking? Authorizing Provider  ?aspirin EC 81 MG tablet Take 81 mg by mouth in the morning. Swallow whole.   Yes [provider]  ?atorvastatin (LIPITOR) 40 MG tablet Take 40 mg by mouth in the morning.   Yes [provider]  ?Cholecalciferol (DIALYVITE VITAMIN D 5000) 125 MCG (5000 UT) capsule Take 5,000 Units by mouth daily.   Yes [provider]  ?clopidogrel (PLAVIX) 75 MG tablet Take 1 tablet (75 mg total) by mouth daily with breakfast.  05/21/15  Yes Angiulli, Lavon Paganini, PA-C  ?Cyanocobalamin (VITAMIN B-12) 2500 MCG SUBL Take 2,500 mcg by mouth in the morning.   Yes [provider]  ?pantoprazole (PROTONIX) 40 MG tablet Take 1 tablet (40 mg total) by mouth 2 (two) times daily. ?Patient taking differently: Take 40 mg by mouth in the morning. 05/21/15  Yes Angiulli, Lavon Paganini, PA-C  ?acetaminophen (TYLENOL) 500 MG tablet Take 500 mg by mouth every 6 (six) hours as needed for moderate pain or headache.    [provider]  ?  ? ?Family History  ?Problem Relation Age of Onset  ? Cancer Mother   ? Heart attack Father   ? ? ?Social History  ? ?Socioeconomic History  ? Marital status: Married  ?  Spouse name: Not on file  ? Number of children: Not on file  ? Years of education: Not on file  ? Highest education level: Not on file  ?Occupational History  ? Not on file  ?Tobacco Use  ? Smoking status: Every Day  ?  Packs/day: 0.50  ?  Years: 40.00  ?  Pack years: 20.00  ?  Types: Cigarettes  ? Smokeless tobacco: Never  ? Tobacco comments:  ?  1-1 1/2 ppd  ?Vaping Use  ? Vaping Use: Never used  ?Substance and Sexual Activity  ? Alcohol use: Not Currently  ?  Alcohol/week: 0.0 standard drinks  ? Drug use: Never  ? Sexual activity: Not on file  ?Other Topics Concern  ? Not on file  ?Social History Narrative  ? Not on file  ? ?Social Determinants of Health  ? ?Financial Resource Strain: Not on file  ?Food Insecurity: Not on file  ?Transportation Needs: Not on file  ?Physical Activity: Not on file  ?Stress: Not on file  ?Social Connections: Not on file  ? ? ?Review of Systems: A 12 point ROS discussed and pertinent positives are indicated in the HPI above.  All other systems are negative. ? ?Review of Systems  ?Constitutional:  Negative for appetite change and fatigue.  ?Respiratory:  Negative for cough and shortness of breath.   ?Cardiovascular:  Negative for chest pain and leg swelling.  ?Gastrointestinal:  Positive for abdominal pain. Negative for  diarrhea, nausea and vomiting.  ?Neurological:  Negative for dizziness and headaches.  ? ?Vital Signs: ?BP (!) 149/61   Pulse 71   Temp 98.2 ?F (36.8 ?C) (Oral)   Resp 20   Ht '5\' 3"'$  (1.6 m)   Wt 188 lb (85.3 kg)   LMP  (LMP Unknown)   SpO2 99%   BMI 33.30 kg/m?  ? ?Physical Exam ?Constitutional:   ?   General: She is not in acute distress. ?   Appearance: She is not ill-appearing.  ?HENT:  ?   Mouth/Throat:  ?   Mouth: Mucous membranes are moist.  ?   Pharynx: Oropharynx is clear.  ?Cardiovascular:  ?  Rate and Rhythm: Normal rate and regular rhythm.  ?   Pulses: Normal pulses.  ?   Heart sounds: Normal heart sounds.  ?Pulmonary:  ?   Effort: Pulmonary effort is normal.  ?   Breath sounds: Normal breath sounds.  ?Abdominal:  ?   General: Bowel sounds are normal.  ?   Palpations: Abdomen is soft.  ?   Tenderness: There is abdominal tenderness.  ?Musculoskeletal:  ?   Right lower leg: No edema.  ?   Left lower leg: No edema.  ?Skin: ?   General: Skin is warm and dry.  ?Neurological:  ?   Mental Status: She is alert and oriented to person, place, and time.  ? ? ?Imaging: ?VAS [place] ABI WITH/WO TBI ? ?Result Date: 06/07/2021 ? LOWER EXTREMITY DOPPLER STUDY Patient Name:  NAYDEEN SPEIRS  Date of Exam:   06/07/2021 Medical Rec #: [id number]   Accession #:    [id number] Date of Birth: Jan 21, 1951    Patient Gender: F Patient Age:   71 years Exam Location:  [facility] Procedure:      VAS [place] ABI WITH/WO TBI Referring Phys: Harold Barban --------------------------------------------------------------------------------  Indications: Peripheral artery disease.  Vascular Interventions: 06/26/20: Bilateral CIA stent and left CCA/ICA re-stent by                         Dr. Estanislado Pandy. Performing Technologist: Ralene Cork RVT  Examination Guidelines: A complete evaluation includes at minimum, Doppler waveform signals and systolic blood pressure reading at the level of bilateral brachial, anterior tibial, and posterior  tibial arteries, when vessel segments are accessible. Bilateral testing is considered an integral part of a complete examination. Photoelectric Plethysmograph (PPG) waveforms and toe systolic pressure reading

## 2021-06-09 NOTE — Procedures (Signed)
INR. ?Left common carotid arteriogram. ? ?Right radial approach. ? ?Findings. ? ?1.  Approximately 50% IntraStent stenosis of the proximal left internal carotid artery. ?2.  Brisk contralateral flow to the right anterior circulation via the anterior communicating artery. ? ?Arlean Hopping MD ?

## 2021-11-04 ENCOUNTER — Encounter: Payer: Self-pay | Admitting: Interventional Radiology

## 2021-12-01 ENCOUNTER — Telehealth (HOSPITAL_COMMUNITY): Payer: Self-pay

## 2021-12-01 NOTE — Telephone Encounter (Signed)
Called to schedule us carotid, no answer, no vm. AW 

## 2022-03-17 ENCOUNTER — Telehealth: Payer: Self-pay

## 2022-03-17 NOTE — Telephone Encounter (Signed)
Request for recent office visit, labs and imaging have has been faxed to patient's PCP.

## 2022-03-30 ENCOUNTER — Ambulatory Visit: Payer: Medicare HMO | Admitting: Oncology

## 2022-03-30 ENCOUNTER — Other Ambulatory Visit: Payer: Medicare HMO

## 2022-04-06 ENCOUNTER — Inpatient Hospital Stay: Payer: Medicare HMO | Attending: Oncology | Admitting: Oncology

## 2022-04-06 ENCOUNTER — Encounter: Payer: Self-pay | Admitting: Oncology

## 2022-04-06 ENCOUNTER — Inpatient Hospital Stay: Payer: Medicare HMO

## 2022-04-06 VITALS — BP 201/81 | HR 69 | Temp 98.0°F | Resp 16 | Ht 63.6 in | Wt 182.8 lb

## 2022-04-06 DIAGNOSIS — Z8249 Family history of ischemic heart disease and other diseases of the circulatory system: Secondary | ICD-10-CM | POA: Diagnosis not present

## 2022-04-06 DIAGNOSIS — Z7902 Long term (current) use of antithrombotics/antiplatelets: Secondary | ICD-10-CM | POA: Insufficient documentation

## 2022-04-06 DIAGNOSIS — H9190 Unspecified hearing loss, unspecified ear: Secondary | ICD-10-CM | POA: Diagnosis not present

## 2022-04-06 DIAGNOSIS — F17210 Nicotine dependence, cigarettes, uncomplicated: Secondary | ICD-10-CM | POA: Diagnosis not present

## 2022-04-06 DIAGNOSIS — Z79899 Other long term (current) drug therapy: Secondary | ICD-10-CM | POA: Insufficient documentation

## 2022-04-06 DIAGNOSIS — D510 Vitamin B12 deficiency anemia due to intrinsic factor deficiency: Secondary | ICD-10-CM

## 2022-04-06 DIAGNOSIS — D649 Anemia, unspecified: Secondary | ICD-10-CM | POA: Insufficient documentation

## 2022-04-06 DIAGNOSIS — G4730 Sleep apnea, unspecified: Secondary | ICD-10-CM | POA: Diagnosis not present

## 2022-04-06 DIAGNOSIS — C541 Malignant neoplasm of endometrium: Secondary | ICD-10-CM | POA: Insufficient documentation

## 2022-04-06 DIAGNOSIS — Z881 Allergy status to other antibiotic agents status: Secondary | ICD-10-CM | POA: Diagnosis not present

## 2022-04-06 DIAGNOSIS — E785 Hyperlipidemia, unspecified: Secondary | ICD-10-CM | POA: Diagnosis not present

## 2022-04-06 DIAGNOSIS — Z801 Family history of malignant neoplasm of trachea, bronchus and lung: Secondary | ICD-10-CM | POA: Insufficient documentation

## 2022-04-06 DIAGNOSIS — N950 Postmenopausal bleeding: Secondary | ICD-10-CM | POA: Insufficient documentation

## 2022-04-06 DIAGNOSIS — E559 Vitamin D deficiency, unspecified: Secondary | ICD-10-CM | POA: Diagnosis not present

## 2022-04-06 DIAGNOSIS — R112 Nausea with vomiting, unspecified: Secondary | ICD-10-CM

## 2022-04-06 DIAGNOSIS — I739 Peripheral vascular disease, unspecified: Secondary | ICD-10-CM | POA: Insufficient documentation

## 2022-04-06 DIAGNOSIS — Z809 Family history of malignant neoplasm, unspecified: Secondary | ICD-10-CM | POA: Diagnosis not present

## 2022-04-06 DIAGNOSIS — E538 Deficiency of other specified B group vitamins: Secondary | ICD-10-CM | POA: Insufficient documentation

## 2022-04-06 DIAGNOSIS — Z90722 Acquired absence of ovaries, bilateral: Secondary | ICD-10-CM | POA: Insufficient documentation

## 2022-04-06 DIAGNOSIS — Z808 Family history of malignant neoplasm of other organs or systems: Secondary | ICD-10-CM | POA: Diagnosis not present

## 2022-04-06 DIAGNOSIS — K5900 Constipation, unspecified: Secondary | ICD-10-CM

## 2022-04-06 DIAGNOSIS — D500 Iron deficiency anemia secondary to blood loss (chronic): Secondary | ICD-10-CM | POA: Diagnosis not present

## 2022-04-06 DIAGNOSIS — R350 Frequency of micturition: Secondary | ICD-10-CM

## 2022-04-06 DIAGNOSIS — Z8673 Personal history of transient ischemic attack (TIA), and cerebral infarction without residual deficits: Secondary | ICD-10-CM | POA: Insufficient documentation

## 2022-04-06 DIAGNOSIS — I2510 Atherosclerotic heart disease of native coronary artery without angina pectoris: Secondary | ICD-10-CM

## 2022-04-06 DIAGNOSIS — Z8379 Family history of other diseases of the digestive system: Secondary | ICD-10-CM | POA: Diagnosis not present

## 2022-04-06 DIAGNOSIS — F5089 Other specified eating disorder: Secondary | ICD-10-CM | POA: Diagnosis not present

## 2022-04-06 DIAGNOSIS — Z90710 Acquired absence of both cervix and uterus: Secondary | ICD-10-CM | POA: Diagnosis not present

## 2022-04-06 DIAGNOSIS — Z1509 Genetic susceptibility to other malignant neoplasm: Secondary | ICD-10-CM

## 2022-04-06 DIAGNOSIS — Z91199 Patient's noncompliance with other medical treatment and regimen due to unspecified reason: Secondary | ICD-10-CM

## 2022-04-06 DIAGNOSIS — K219 Gastro-esophageal reflux disease without esophagitis: Secondary | ICD-10-CM | POA: Diagnosis not present

## 2022-04-06 DIAGNOSIS — R531 Weakness: Secondary | ICD-10-CM

## 2022-04-06 LAB — CBC WITH DIFFERENTIAL/PLATELET
Abs Immature Granulocytes: 0.02 10*3/uL (ref 0.00–0.07)
Basophils Absolute: 0.1 10*3/uL (ref 0.0–0.1)
Basophils Relative: 1 %
Eosinophils Absolute: 0.1 10*3/uL (ref 0.0–0.5)
Eosinophils Relative: 1 %
HCT: 36 % (ref 36.0–46.0)
Hemoglobin: 10.7 g/dL — ABNORMAL LOW (ref 12.0–15.0)
Immature Granulocytes: 0 %
Lymphocytes Relative: 23 %
Lymphs Abs: 2.4 10*3/uL (ref 0.7–4.0)
MCH: 23.1 pg — ABNORMAL LOW (ref 26.0–34.0)
MCHC: 29.7 g/dL — ABNORMAL LOW (ref 30.0–36.0)
MCV: 77.6 fL — ABNORMAL LOW (ref 80.0–100.0)
Monocytes Absolute: 1 10*3/uL (ref 0.1–1.0)
Monocytes Relative: 9 %
Neutro Abs: 6.7 10*3/uL (ref 1.7–7.7)
Neutrophils Relative %: 66 %
Platelets: 225 10*3/uL (ref 150–400)
RBC: 4.64 MIL/uL (ref 3.87–5.11)
RDW: 20.9 % — ABNORMAL HIGH (ref 11.5–15.5)
WBC: 10.3 10*3/uL (ref 4.0–10.5)
nRBC: 0 % (ref 0.0–0.2)

## 2022-04-06 LAB — COMPREHENSIVE METABOLIC PANEL
ALT: 8 U/L (ref 0–44)
AST: 15 U/L (ref 15–41)
Albumin: 3.4 g/dL — ABNORMAL LOW (ref 3.5–5.0)
Alkaline Phosphatase: 100 U/L (ref 38–126)
Anion gap: 15 (ref 5–15)
BUN: 9 mg/dL (ref 8–23)
CO2: 27 mmol/L (ref 22–32)
Calcium: 8.8 mg/dL — ABNORMAL LOW (ref 8.9–10.3)
Chloride: 96 mmol/L — ABNORMAL LOW (ref 98–111)
Creatinine, Ser: 0.96 mg/dL (ref 0.44–1.00)
GFR, Estimated: >60 mL/min (ref >60)
Glucose, Bld: 79 mg/dL (ref 70–99)
Potassium: 2.8 mmol/L — ABNORMAL LOW (ref 3.5–5.1)
Sodium: 138 mmol/L (ref 135–145)
Total Bilirubin: 0.6 mg/dL (ref 0.3–1.2)
Total Protein: 6.7 g/dL (ref 6.5–8.1)

## 2022-04-06 LAB — FERRITIN: Ferritin: 8 ng/mL — ABNORMAL LOW (ref 11–307)

## 2022-04-06 LAB — VITAMIN B12: Vitamin B-12: 298 pg/mL (ref 180–914)

## 2022-04-06 NOTE — Progress Notes (Signed)
[facility] Initial Visit:  Patient Care Team: Bonnita Nasuti, MD as PCP - General (Internal Medicine)  CHIEF COMPLAINTS/PURPOSE OF CONSULTATION:  Oncology History  Endometrial cancer, FIGO stage IIIA ([place])  04/06/2022 Initial Diagnosis   Endometrial cancer, FIGO stage IIIA ([place])   04/06/2022 Cancer Staging   Staging form: Corpus Uteri - Carcinoma and Carcinosarcoma, AJCC 8th Edition - Clinical stage from 04/06/2022: FIGO Stage IIIA (cT3a, cN0, cM0) - Signed by Barbee Cough, MD on 04/06/2022 Histopathologic type: Carcinoma, undifferentiated, NOS Stage prefix: Initial diagnosis Histologic grade (G): G2 Histologic grading system: 3 grade system     HISTORY OF PRESENTING ILLNESS: Monica Pugh 72 y.o. female is here because of  endometrial cancer  Medical history notable for anemia, carotid artery disease, GERD, hearing loss, hyperlipidemia, peripheral vascular disease, sleep apnea (does not use CPAP) CVA, B12 deficiency vitamin D deficiency  April 05 2021:  Presented to [facility] ED with 8 days of vaginal bleeding  Pelvic ultrasound--showed a possibly thickened endometrium at 1.3 cm with an area of heterogeneous echogenicity near the fundus which may represent calcifications..  Anatomy poorly visualized owing to body habitus. CT abdomen pelvis--suspected 3.0 cm diameter submucosal leiomyoma and anterior upper uterus corresponding to the finding question proceeding ultrasound  May 18, 2021: Presented to gynecologic oncology with newly diagnosed grade 2 endometrioid cancer in setting of heavy postmenopausal bleeding since February 2023  May 25, 2021: CT chest for metastasis  June 17, 2021: Underwent total laparoscopic hysterectomy and bilateral salpingo-oophorectomy revealing a FIGO stage IIIa (T3a N0) endometrial cancer.  Immunohistochemistry showed loss of nuclear expression in MLH1 and PM S2 with [facility] 2 and MSH6 intact  Jun 30 2021:  Given final  stage it was recommended to the patient that she receive adjuvant chemotherapy with carboplatin + paclitaxel as well as vaginal cuff brachytherapy. Patient would like to have chemotherapy locally - referral to heme/onc in [place] placed and coordinator and nurse navigator messaged. Treatment can start anytime between 4-6 weeks postop.   April 06 2022:  [place]  Patient declined pursuing any adjuvant therapy because she was concerned that the treatment would make her sicker and because the "surgeon got it all".  She also declined follow up with Medical Oncology or Gynecology.  Craving ice.  Can not tolerate oral iron.   Supposed to see Dr. Lars Mage in March  Social:  Married.  Made medical kits.  Began smoking at 72 years of age and quit age 75.  EtOH none  [place] Mother died 2 lung cancer (smoker) Father died 40 MI Brother died 95 "boone cancer" Brother died 17 cirrhosis (EtOH)    Review of Systems  Constitutional:  Negative for appetite change, chills, fatigue, fever and unexpected weight change.       Has gained weight.  Appetite fair  HENT:   Positive for hearing loss. Negative for lump/mass, mouth sores, nosebleeds, sore throat, tinnitus, trouble swallowing and voice change.   Eyes:  Negative for eye problems and icterus.       Vision changes:  None  Respiratory:  Negative for chest tightness, cough, hemoptysis, shortness of breath and wheezing.        PND:  none Orthopnea:  none DOE:    Cardiovascular:  Negative for chest pain, leg swelling and palpitations.       PND:  none Orthopnea:  none  Gastrointestinal:  Positive for constipation, nausea and vomiting. Negative for abdominal distention, abdominal pain,  blood in stool and diarrhea.  Endocrine: Negative for hot flashes.       Cold intolerance:  none Heat intolerance:  none  Genitourinary:  Positive for frequency. Negative for difficulty urinating, dysuria, hematuria, nocturia and  vaginal bleeding.        Has some stress incontinence and suprapubic pain  Musculoskeletal:  Negative for arthralgias, back pain, gait problem, myalgias, neck pain and neck stiffness.  Skin:  Negative for itching, rash and wound.  Neurological:  Positive for extremity weakness. Negative for dizziness, gait problem, light-headedness, numbness, seizures and speech difficulty.       Occasional right sided headaches.    Hematological:  Negative for adenopathy. Does not bruise/bleed easily.  Psychiatric/Behavioral:  Negative for sleep disturbance and suicidal ideas. The patient is not nervous/anxious.     MEDICAL HISTORY: Past Medical History:  Diagnosis Date   Anemia    Carotid artery occlusion    GERD (gastroesophageal reflux disease)    Hearing loss    bilateral - no hearing aids   Hyperlipidemia    Peripheral vascular disease (HCC)    Sleep apnea    does not use cpap   Stroke ([place])    Vitamin B 12 deficiency    Vitamin D deficiency     SURGICAL HISTORY: Past Surgical History:  Procedure Laterality Date   ABDOMINAL HYSTERECTOMY     COLONOSCOPY     IR ANGIO INTRA EXTRACRAN SEL COM CAROTID INNOMINATE UNI R MOD SED  05/11/2020   IR ANGIO INTRA EXTRACRAN SEL INTERNAL CAROTID UNI L MOD SED  05/11/2020   IR ANGIO INTRA EXTRACRAN SEL INTERNAL CAROTID UNI L MOD SED  06/08/2020   IR ANGIO INTRA EXTRACRAN SEL INTERNAL CAROTID UNI L MOD SED  06/26/2020   IR ANGIO INTRA EXTRACRAN SEL INTERNAL CAROTID UNI L MOD SED  06/09/2021   IR ANGIO VERTEBRAL SEL SUBCLAVIAN INNOMINATE BILAT MOD SED  05/11/2020   IR ANGIOGRAM EXTREMITY BILATERAL  06/08/2020   IR CT HEAD LTD  06/26/2020   IR ILIAC ART STENT INC PTA MOD SED  06/26/2020   IR ILIAC ART STENT INC PTA MOD SED  06/26/2020   IR INTRAVSC STENT CERV CAROTID W/EMB-PROT MOD SED INCL ANGIO  06/26/2020   IR IVUS EACH ADDITIONAL NON CORONARY VESSEL  06/26/2020   IR RADIOLOGIST EVAL & MGMT  05/28/2020   IR RADIOLOGIST EVAL & MGMT  07/15/2020   IR  RADIOLOGIST EVAL & MGMT  07/27/2020   IR US GUIDE VASC ACCESS LEFT  06/26/2020   IR US GUIDE VASC ACCESS RIGHT  05/11/2020   IR US GUIDE VASC ACCESS RIGHT  06/08/2020   IR US GUIDE VASC ACCESS RIGHT  06/26/2020   IR US GUIDE VASC ACCESS RIGHT  06/09/2021   RADIOLOGY WITH ANESTHESIA N/A 05/09/2015   Procedure: RADIOLOGY WITH ANESTHESIA;  Surgeon: Luanne Bras, MD;  Location: [place];  Service: Radiology;  Laterality: N/A;   RADIOLOGY WITH ANESTHESIA N/A 06/08/2020   Procedure: IR WITH ANESTHESIA ANGIOPLASTY;  Surgeon: Luanne Bras, MD;  Location: [place];  Service: Radiology;  Laterality: N/A;   RADIOLOGY WITH ANESTHESIA N/A 06/26/2020   Procedure: REVASCULATION;  Surgeon: Luanne Bras, MD;  Location: [place];  Service: Radiology;  Laterality: N/A;   TUBAL LIGATION      SOCIAL HISTORY: Social History   Socioeconomic History   Marital status: Married    Spouse name: Not on file   Number of children: Not on file   Years of education: Not on  file   Highest education level: Not on file  Occupational History   Not on file  Tobacco Use   Smoking status: Every Day    Packs/day: 0.50    Years: 40.00    Total pack years: 20.00    Types: Cigarettes   Smokeless tobacco: Never   Tobacco comments:    1-1 1/2 ppd  Vaping Use   Vaping Use: Never used  Substance and Sexual Activity   Alcohol use: Not Currently    Alcohol/week: 0.0 standard drinks of alcohol   Drug use: Never   Sexual activity: Not on file  Other Topics Concern   Not on file  Social History Narrative   Not on file   Social Determinants of Health   Financial Resource Strain: Not on file  Food Insecurity: No Food Insecurity (04/06/2022)   Hunger Vital Sign    Worried About Running Out of Food in the Last Year: Never true    Ran Out of Food in the Last Year: Never true  Transportation Needs: No Transportation Needs (04/06/2022)   PRAPARE - Hydrologist (Medical): No    Lack of  Transportation (Non-Medical): No  Physical Activity: Not on file  Stress: Not on file  Social Connections: Not on file  Intimate Partner Violence: Not on file    FAMILY HISTORY Family History  Problem Relation Age of Onset   Cancer Mother    Hypertension Father    Heart attack Father    Cirrhosis Brother    Cancer Brother        In spine   Heart attack Brother     ALLERGIES:  is allergic to cefadroxil.  MEDICATIONS:  Current Outpatient Medications  Medication Sig Dispense Refill   acetaminophen (TYLENOL) 500 MG tablet Take 500 mg by mouth every 6 (six) hours as needed for moderate pain or headache.     aspirin EC 81 MG tablet Take 81 mg by mouth in the morning. Swallow whole.     atorvastatin (LIPITOR) 40 MG tablet Take 40 mg by mouth in the morning.     Cholecalciferol (DIALYVITE VITAMIN D 5000) 125 MCG (5000 UT) capsule Take 5,000 Units by mouth daily.     clopidogrel (PLAVIX) 75 MG tablet Take 1 tablet (75 mg total) by mouth daily with breakfast. 30 tablet 1   Cyanocobalamin (VITAMIN B-12) 2500 MCG SUBL Take 2,500 mcg by mouth in the morning.     ibuprofen (ADVIL) 800 MG tablet Take 800 mg by mouth 3 (three) times daily.     pantoprazole (PROTONIX) 40 MG tablet Take 1 tablet (40 mg total) by mouth 2 (two) times daily. (Patient taking differently: Take 40 mg by mouth in the morning.) 60 tablet 2   potassium chloride SA (KLOR-CON M) 20 MEQ tablet Take 1 tablet (20 mEq total) by mouth 2 (two) times daily for 14 days. 28 tablet 0   No current facility-administered medications for this visit.    PHYSICAL EXAMINATION:  ECOG PERFORMANCE STATUS: 2 - Symptomatic, <50% confined to bed   Vitals:   04/06/22 1315  BP: (!) 201/81  Pulse: 69  Resp: 16  Temp: 98 F (36.7 C)  SpO2: 97%    Filed Weights   04/06/22 1315  Weight: 182 lb 12.8 oz (82.9 kg)     Physical Exam Vitals and nursing note reviewed.  Constitutional:      Appearance: Normal appearance. She is not  toxic-appearing or diaphoretic.  Comments: Here with husband  HENT:     Head: Normocephalic and atraumatic.     Right Ear: External ear normal.     Left Ear: External ear normal.     Nose: Nose normal. No congestion or rhinorrhea.  Eyes:     General: No scleral icterus.    Extraocular Movements: Extraocular movements intact.     Conjunctiva/sclera: Conjunctivae normal.     Pupils: Pupils are equal, round, and reactive to light.  Cardiovascular:     Rate and Rhythm: Normal rate.     Heart sounds: No murmur heard.    No friction rub. No gallop.  Pulmonary:     Effort: Pulmonary effort is normal. No respiratory distress.     Breath sounds: Normal breath sounds. No stridor. No wheezing or rhonchi.  Abdominal:     General: Bowel sounds are normal.     Palpations: Abdomen is soft.  Musculoskeletal:        General: No swelling, tenderness or deformity.     Cervical back: Normal range of motion and neck supple. No rigidity or tenderness.  Lymphadenopathy:     Head:     Right side of head: No submental, submandibular, tonsillar, preauricular, posterior auricular or occipital adenopathy.     Left side of head: No submental, submandibular, tonsillar, preauricular, posterior auricular or occipital adenopathy.     Cervical: No cervical adenopathy.     Right cervical: No superficial, deep or posterior cervical adenopathy.    Left cervical: No superficial, deep or posterior cervical adenopathy.     Upper Body:     Right upper body: No supraclavicular, axillary, pectoral or epitrochlear adenopathy.     Left upper body: No supraclavicular, axillary, pectoral or epitrochlear adenopathy.  Skin:    General: Skin is warm.     Coloration: Skin is pale. Skin is not jaundiced.     Findings: No erythema.  Neurological:     General: No focal deficit present.     Mental Status: She is alert and oriented to person, place, and time.     Comments: Hard of hearing  Psychiatric:        Mood and  Affect: Mood normal.        Behavior: Behavior normal.        Thought Content: Thought content normal.        Judgment: Judgment normal.     LABORATORY DATA: I have personally reviewed the data as listed:  Appointment on 04/06/2022  Component Date Value Ref Range Status   Cancer Antigen (CA) 125 04/06/2022 83.5 (H)  0.0 - 38.1 U/mL Final   Comment: (NOTE) Roche Diagnostics Electrochemiluminescence Immunoassay (ECLIA) Values obtained with different assay methods or kits cannot be used interchangeably.  Results cannot be interpreted as absolute evidence of the presence or absence of malignant disease. Performed At: [facility] [place], [place] [id number] Rush Farmer MD [id number]    Vitamin B-12 04/06/2022 298  180 - 914 pg/mL Final   Comment: (NOTE) This assay is not validated for testing neonatal or myeloproliferative syndrome specimens for Vitamin B12 levels. Performed at [facility], [place] [address] [place], [place] [address]    Ferritin 04/06/2022 8 (L)  11 - 307 ng/mL Final   Performed at [facility] [address], [place], [place] [address]  Office Visit on 04/06/2022  Component Date Value Ref Range Status   WBC 04/06/2022 10.3  4.0 - 10.5 K/uL Final  RBC 04/06/2022 4.64  3.87 - 5.11 MIL/uL Final   Hemoglobin 04/06/2022 10.7 (L)  12.0 - 15.0 g/dL Final   HCT 04/06/2022 36.0  36.0 - 46.0 % Final   MCV 04/06/2022 77.6 (L)  80.0 - 100.0 fL Final   MCH 04/06/2022 23.1 (L)  26.0 - 34.0 pg Final   MCHC 04/06/2022 29.7 (L)  30.0 - 36.0 g/dL Final   RDW 04/06/2022 20.9 (H)  11.5 - 15.5 % Final   Platelets 04/06/2022 225  150 - 400 K/uL Final   Comment: SPECIMEN CHECKED FOR CLOTS REPEATED TO VERIFY PLATELET COUNT CONFIRMED BY SMEAR    nRBC 04/06/2022 0.0  0.0 - 0.2 % Final   Neutrophils Relative % 04/06/2022 66  % Final   Neutro Abs 04/06/2022 6.7  1.7 - 7.7 K/uL Final   Lymphocytes Relative  04/06/2022 23  % Final   Lymphs Abs 04/06/2022 2.4  0.7 - 4.0 K/uL Final   Monocytes Relative 04/06/2022 9  % Final   Monocytes Absolute 04/06/2022 1.0  0.1 - 1.0 K/uL Final   Eosinophils Relative 04/06/2022 1  % Final   Eosinophils Absolute 04/06/2022 0.1  0.0 - 0.5 K/uL Final   Basophils Relative 04/06/2022 1  % Final   Basophils Absolute 04/06/2022 0.1  0.0 - 0.1 K/uL Final   Immature Granulocytes 04/06/2022 0  % Final   Abs Immature Granulocytes 04/06/2022 0.02  0.00 - 0.07 K/uL Final   Performed at [facility], [place] [address], [place], [place] [address]   Sodium 04/06/2022 138  135 - 145 mmol/L Final   Potassium 04/06/2022 2.8 (L)  3.5 - 5.1 mmol/L Final   Chloride 04/06/2022 96 (L)  98 - 111 mmol/L Final   CO2 04/06/2022 27  22 - 32 mmol/L Final   Glucose, Bld 04/06/2022 79  70 - 99 mg/dL Final   Glucose reference range applies only to samples taken after fasting for at least 8 hours.   BUN 04/06/2022 9  8 - 23 mg/dL Final   Creatinine, Ser 04/06/2022 0.96  0.44 - 1.00 mg/dL Final   Calcium 04/06/2022 8.8 (L)  8.9 - 10.3 mg/dL Final   Total Protein 04/06/2022 6.7  6.5 - 8.1 g/dL Final   Albumin 04/06/2022 3.4 (L)  3.5 - 5.0 g/dL Final   AST 04/06/2022 15  15 - 41 U/L Final   ALT 04/06/2022 8  0 - 44 U/L Final   Alkaline Phosphatase 04/06/2022 100  38 - 126 U/L Final   Total Bilirubin 04/06/2022 0.6  0.3 - 1.2 mg/dL Final   GFR, Estimated 04/06/2022 >60  >60 mL/min Final   Comment: (NOTE) Calculated using the CKD-EPI Creatinine Equation (2021)    Anion gap 04/06/2022 15  5 - 15 Final   Performed at [facility], [place] [address] [place], [place] [address]    RADIOGRAPHIC STUDIES: I have personally reviewed the radiological images as listed and agree with the findings in the report  No results found.  ASSESSMENT/PLAN   72 y.o. female with medical history notable for anemia, carotid artery disease, GERD, hearing loss, hyperlipidemia,  peripheral vascular disease, sleep apnea (does not use CPAP) CVA, B12 deficiency vitamin D deficiency.  Patient has history of endometrial carcinoma treated with resection in April 2023  Endometrial carcinoma, FIGO  Stage IIIA (T3a N0 M0), Grade 2, MSI-H:   June 17 2021:  Total laparoscopic hysterectomy and bilateral salpingo-oophorectomy performed Adjuvant chemotherapy and XRT recommended which patient declined .  Patient also declined regular follow up April 06 2022:  Will obtain CT AP CA125 for restaging   MSI High status:  Suggests that patient should be evaluated for Lynch syndrome.  Should she develop recurrent endometrial cancer would potentially benefit from immunotherapy  Ice pica and fatigue:   April 06 2022:  Concerning for possibility of anemia from iron deficiency.  Will check CBC with diff, Ferritin, B12.   For GI consult.  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   Orders Placed This Encounter  Procedures   CT Abdomen Pelvis W Contrast    Standing Status:   Future    Standing Expiration Date:   04/07/2023    Order Specific Question:   If indicated for the ordered procedure, I authorize the administration of contrast media per Radiology protocol    Answer:   Yes    Order Specific Question:   Does the patient have a contrast media/X-ray dye allergy?    Answer:   Yes     Order Specific Question:   Preferred imaging location?    Answer:   External    Order Specific Question:   Is Oral Contrast requested for this exam?    Answer:   Yes, Per Radiology protocol   Ferritin    Standing Status:   Future    Number of Occurrences:   1    Standing Expiration Date:   04/07/2023   Vitamin B12    Standing Status:   Future    Number of Occurrences:   1    Standing Expiration Date:   04/07/2023   CA 125    Standing Status:   Future    Number of Occurrences:   1    Standing Expiration Date:   04/07/2023   CBC with Differential/Platelet   Comprehensive metabolic panel   60  minutes was spent in patient care.  This included time spent preparing to see the patient (e.g., review of tests), obtaining and/or reviewing separately obtained history, counseling and educating the patient/family/caregiver, ordering tests; documenting clinical information in the electronic or other health record, independently interpreting results and communicating results to the patient/family/caregiver as well as coordination of care.      All questions were answered. The patient knows to call the clinic with any problems, questions or concerns.  This note was electronically signed.    Barbee Cough, MD  04/12/2022 1:03 PM

## 2022-04-07 ENCOUNTER — Telehealth: Payer: Self-pay | Admitting: Oncology

## 2022-04-07 NOTE — Telephone Encounter (Signed)
04/07/22 LVM SCANS ON 04/13/22@2pm$ .Arrive at 130pm

## 2022-04-08 LAB — CA 125: Cancer Antigen (CA) 125: 83.5 U/mL — ABNORMAL HIGH (ref 0.0–38.1)

## 2022-04-11 ENCOUNTER — Other Ambulatory Visit: Payer: Self-pay | Admitting: Pharmacist

## 2022-04-11 ENCOUNTER — Encounter: Payer: Self-pay | Admitting: Oncology

## 2022-04-11 DIAGNOSIS — D509 Iron deficiency anemia, unspecified: Secondary | ICD-10-CM | POA: Insufficient documentation

## 2022-04-12 ENCOUNTER — Encounter: Payer: Self-pay | Admitting: Oncology

## 2022-04-12 ENCOUNTER — Telehealth: Payer: Self-pay | Admitting: Oncology

## 2022-04-12 ENCOUNTER — Other Ambulatory Visit: Payer: Self-pay

## 2022-04-12 ENCOUNTER — Telehealth: Payer: Self-pay

## 2022-04-12 DIAGNOSIS — Z1509 Genetic susceptibility to other malignant neoplasm: Secondary | ICD-10-CM | POA: Insufficient documentation

## 2022-04-12 DIAGNOSIS — Z91199 Patient's noncompliance with other medical treatment and regimen due to unspecified reason: Secondary | ICD-10-CM | POA: Insufficient documentation

## 2022-04-12 MED ORDER — POTASSIUM CHLORIDE CRYS ER 20 MEQ PO TBCR: 20.0000 meq | EXTENDED_RELEASE_TABLET | Freq: Two times a day (BID) | ORAL | 0 refills | Status: DC

## 2022-04-12 NOTE — Telephone Encounter (Signed)
Contacted pt to schedule an appt. Unable to reach via phone, voicemail was left.    Scheduling Message Entered by Juanetta Beets on 04/11/2022 at 12:54 PM Priority: Routine <No visit type provided>  Department: [facility]  Provider:  Appointment Notes:  Original plan was for feraheme, but insurance requires venofer.  Please schedule pt for 5 doses of venofer and let pt know.  Scheduling Notes:

## 2022-04-12 NOTE — Telephone Encounter (Signed)
-----   Message from Barbee Cough, MD sent at 04/12/2022  9:00 AM EST ----- Yes.  Please have her start Kdur 20 mEq bid Thanks  ----- Message ----- From: Aleatha Borer, CMA Sent: 04/11/2022   2:27 PM EST To: Barbee Cough, MD  Before I call the patient about needing iron, would you like me to send in some potassium since her potassium level is 2.8?

## 2022-04-12 NOTE — Telephone Encounter (Signed)
See lab note about IV iron and potassium.

## 2022-04-13 ENCOUNTER — Telehealth: Payer: Self-pay

## 2022-04-13 NOTE — Telephone Encounter (Signed)
Pt's spouse called to ask a couple questions. 1. When will she have the CT scan? She is still hurting in her lower abdomen.  2. Was the potassium sent to pharmacy? I told him the potassium is @ pharmacy. If pt has hard time swallowing the potassium, she can take w/applesauce. I then forwarded the call to [facility], to R/S CT scan that was due for today, as they didn't know anything about it & she doesn't have the contrast.

## 2022-04-16 ENCOUNTER — Inpatient Hospital Stay (HOSPITAL_COMMUNITY)
Admission: EM | Admit: 2022-04-16 | Discharge: 2022-04-25 | DRG: 374 | Disposition: A | Discharge status: Hospice | Payer: Medicare HMO | Attending: Internal Medicine | Admitting: Internal Medicine

## 2022-04-16 ENCOUNTER — Encounter (HOSPITAL_COMMUNITY): Payer: Self-pay | Admitting: Emergency Medicine

## 2022-04-16 ENCOUNTER — Other Ambulatory Visit: Payer: Self-pay

## 2022-04-16 ENCOUNTER — Emergency Department (HOSPITAL_COMMUNITY): Payer: Medicare HMO

## 2022-04-16 DIAGNOSIS — I2510 Atherosclerotic heart disease of native coronary artery without angina pectoris: Secondary | ICD-10-CM | POA: Diagnosis present

## 2022-04-16 DIAGNOSIS — I700 Atherosclerosis of aorta: Secondary | ICD-10-CM | POA: Diagnosis present

## 2022-04-16 DIAGNOSIS — I119 Hypertensive heart disease without heart failure: Secondary | ICD-10-CM | POA: Diagnosis present

## 2022-04-16 DIAGNOSIS — Z86010 Personal history of colonic polyps: Secondary | ICD-10-CM

## 2022-04-16 DIAGNOSIS — C7801 Secondary malignant neoplasm of right lung: Secondary | ICD-10-CM | POA: Diagnosis present

## 2022-04-16 DIAGNOSIS — K6389 Other specified diseases of intestine: Secondary | ICD-10-CM | POA: Diagnosis not present

## 2022-04-16 DIAGNOSIS — J9811 Atelectasis: Secondary | ICD-10-CM | POA: Diagnosis not present

## 2022-04-16 DIAGNOSIS — C187 Malignant neoplasm of sigmoid colon: Principal | ICD-10-CM | POA: Diagnosis present

## 2022-04-16 DIAGNOSIS — Z8543 Personal history of malignant neoplasm of ovary: Secondary | ICD-10-CM

## 2022-04-16 DIAGNOSIS — Z79899 Other long term (current) drug therapy: Secondary | ICD-10-CM

## 2022-04-16 DIAGNOSIS — Z8249 Family history of ischemic heart disease and other diseases of the circulatory system: Secondary | ICD-10-CM

## 2022-04-16 DIAGNOSIS — C800 Disseminated malignant neoplasm, unspecified: Secondary | ICD-10-CM

## 2022-04-16 DIAGNOSIS — F17210 Nicotine dependence, cigarettes, uncomplicated: Secondary | ICD-10-CM | POA: Diagnosis present

## 2022-04-16 DIAGNOSIS — I6523 Occlusion and stenosis of bilateral carotid arteries: Secondary | ICD-10-CM | POA: Diagnosis present

## 2022-04-16 DIAGNOSIS — E785 Hyperlipidemia, unspecified: Secondary | ICD-10-CM | POA: Diagnosis present

## 2022-04-16 DIAGNOSIS — K5669 Other partial intestinal obstruction: Secondary | ICD-10-CM | POA: Diagnosis present

## 2022-04-16 DIAGNOSIS — C7802 Secondary malignant neoplasm of left lung: Secondary | ICD-10-CM | POA: Diagnosis present

## 2022-04-16 DIAGNOSIS — K921 Melena: Secondary | ICD-10-CM | POA: Diagnosis present

## 2022-04-16 DIAGNOSIS — D72829 Elevated white blood cell count, unspecified: Secondary | ICD-10-CM | POA: Diagnosis present

## 2022-04-16 DIAGNOSIS — E876 Hypokalemia: Secondary | ICD-10-CM | POA: Diagnosis present

## 2022-04-16 DIAGNOSIS — Z66 Do not resuscitate: Secondary | ICD-10-CM | POA: Diagnosis present

## 2022-04-16 DIAGNOSIS — Z888 Allergy status to other drugs, medicaments and biological substances status: Secondary | ICD-10-CM

## 2022-04-16 DIAGNOSIS — K219 Gastro-esophageal reflux disease without esophagitis: Secondary | ICD-10-CM | POA: Diagnosis present

## 2022-04-16 DIAGNOSIS — R0902 Hypoxemia: Secondary | ICD-10-CM

## 2022-04-16 DIAGNOSIS — Z7902 Long term (current) use of antithrombotics/antiplatelets: Secondary | ICD-10-CM

## 2022-04-16 DIAGNOSIS — K5900 Constipation, unspecified: Secondary | ICD-10-CM | POA: Diagnosis present

## 2022-04-16 DIAGNOSIS — Z1152 Encounter for screening for COVID-19: Secondary | ICD-10-CM

## 2022-04-16 DIAGNOSIS — E669 Obesity, unspecified: Secondary | ICD-10-CM | POA: Diagnosis present

## 2022-04-16 DIAGNOSIS — H9190 Unspecified hearing loss, unspecified ear: Secondary | ICD-10-CM | POA: Diagnosis present

## 2022-04-16 DIAGNOSIS — T402X5A Adverse effect of other opioids, initial encounter: Secondary | ICD-10-CM | POA: Diagnosis present

## 2022-04-16 DIAGNOSIS — R112 Nausea with vomiting, unspecified: Secondary | ICD-10-CM

## 2022-04-16 DIAGNOSIS — Z515 Encounter for palliative care: Secondary | ICD-10-CM

## 2022-04-16 DIAGNOSIS — Z7982 Long term (current) use of aspirin: Secondary | ICD-10-CM

## 2022-04-16 DIAGNOSIS — C771 Secondary and unspecified malignant neoplasm of intrathoracic lymph nodes: Secondary | ICD-10-CM | POA: Diagnosis present

## 2022-04-16 DIAGNOSIS — Z8673 Personal history of transient ischemic attack (TIA), and cerebral infarction without residual deficits: Secondary | ICD-10-CM

## 2022-04-16 DIAGNOSIS — J9601 Acute respiratory failure with hypoxia: Secondary | ICD-10-CM | POA: Diagnosis not present

## 2022-04-16 DIAGNOSIS — D509 Iron deficiency anemia, unspecified: Secondary | ICD-10-CM | POA: Diagnosis present

## 2022-04-16 DIAGNOSIS — I739 Peripheral vascular disease, unspecified: Secondary | ICD-10-CM | POA: Diagnosis present

## 2022-04-16 DIAGNOSIS — G4733 Obstructive sleep apnea (adult) (pediatric): Secondary | ICD-10-CM | POA: Diagnosis present

## 2022-04-16 DIAGNOSIS — Z8542 Personal history of malignant neoplasm of other parts of uterus: Secondary | ICD-10-CM

## 2022-04-16 DIAGNOSIS — Z808 Family history of malignant neoplasm of other organs or systems: Secondary | ICD-10-CM

## 2022-04-16 DIAGNOSIS — D75839 Thrombocytosis, unspecified: Secondary | ICD-10-CM | POA: Diagnosis present

## 2022-04-16 DIAGNOSIS — Z6833 Body mass index (BMI) 33.0-33.9, adult: Secondary | ICD-10-CM

## 2022-04-16 DIAGNOSIS — K648 Other hemorrhoids: Secondary | ICD-10-CM | POA: Diagnosis present

## 2022-04-16 HISTORY — DX: Nausea with vomiting, unspecified: R11.2

## 2022-04-16 LAB — RESP PANEL BY RT-PCR (RSV, FLU A&B, COVID)  RVPGX2
Influenza A by PCR: NEGATIVE
Influenza B by PCR: NEGATIVE
Resp Syncytial Virus by PCR: NEGATIVE
SARS Coronavirus 2 by RT PCR: NEGATIVE

## 2022-04-16 LAB — URINALYSIS, ROUTINE W REFLEX MICROSCOPIC
Glucose, UA: NEGATIVE mg/dL
Hgb urine dipstick: NEGATIVE
Ketones, ur: NEGATIVE mg/dL
Leukocytes,Ua: NEGATIVE
Nitrite: NEGATIVE
Protein, ur: 30 mg/dL — AB
Specific Gravity, Urine: 1.035 — ABNORMAL HIGH (ref 1.005–1.030)
pH: 5 (ref 5.0–8.0)

## 2022-04-16 LAB — CBC WITH DIFFERENTIAL/PLATELET
Abs Immature Granulocytes: 0.04 10*3/uL (ref 0.00–0.07)
Basophils Absolute: 0.1 10*3/uL (ref 0.0–0.1)
Basophils Relative: 0 %
Eosinophils Absolute: 0.1 10*3/uL (ref 0.0–0.5)
Eosinophils Relative: 0 %
HCT: 38.2 % (ref 36.0–46.0)
Hemoglobin: 11.7 g/dL — ABNORMAL LOW (ref 12.0–15.0)
Immature Granulocytes: 0 %
Lymphocytes Relative: 17 %
Lymphs Abs: 2.5 10*3/uL (ref 0.7–4.0)
MCH: 23.4 pg — ABNORMAL LOW (ref 26.0–34.0)
MCHC: 30.6 g/dL (ref 30.0–36.0)
MCV: 76.4 fL — ABNORMAL LOW (ref 80.0–100.0)
Monocytes Absolute: 1 10*3/uL (ref 0.1–1.0)
Monocytes Relative: 7 %
Neutro Abs: 11.1 10*3/uL — ABNORMAL HIGH (ref 1.7–7.7)
Neutrophils Relative %: 76 %
Platelets: 419 10*3/uL — ABNORMAL HIGH (ref 150–400)
RBC: 5 MIL/uL (ref 3.87–5.11)
RDW: 20 % — ABNORMAL HIGH (ref 11.5–15.5)
WBC: 14.7 10*3/uL — ABNORMAL HIGH (ref 4.0–10.5)
nRBC: 0 % (ref 0.0–0.2)

## 2022-04-16 LAB — COMPREHENSIVE METABOLIC PANEL
ALT: 8 U/L (ref 0–44)
AST: 14 U/L — ABNORMAL LOW (ref 15–41)
Albumin: 3.3 g/dL — ABNORMAL LOW (ref 3.5–5.0)
Alkaline Phosphatase: 111 U/L (ref 38–126)
Anion gap: 16 — ABNORMAL HIGH (ref 5–15)
BUN: 12 mg/dL (ref 8–23)
CO2: 28 mmol/L (ref 22–32)
Calcium: 9.4 mg/dL (ref 8.9–10.3)
Chloride: 91 mmol/L — ABNORMAL LOW (ref 98–111)
Creatinine, Ser: 0.87 mg/dL (ref 0.44–1.00)
GFR, Estimated: >60 mL/min (ref >60)
Glucose, Bld: 106 mg/dL — ABNORMAL HIGH (ref 70–99)
Potassium: 2.6 mmol/L — CL (ref 3.5–5.1)
Sodium: 135 mmol/L (ref 135–145)
Total Bilirubin: 0.6 mg/dL (ref 0.3–1.2)
Total Protein: 6.5 g/dL (ref 6.5–8.1)

## 2022-04-16 LAB — LIPASE, BLOOD: Lipase: 21 U/L (ref 11–51)

## 2022-04-16 MED ORDER — NALOXONE HCL 0.4 MG/ML IJ SOLN: 0.4000 mg | INTRAMUSCULAR | Status: DC | PRN

## 2022-04-16 MED ORDER — ONDANSETRON 4 MG PO TBDP
4.0000 mg | ORAL_TABLET | Freq: Once | ORAL | Status: AC
  Administered 2022-04-16: 4 mg via ORAL
  Filled 2022-04-16: qty 1

## 2022-04-16 MED ORDER — MORPHINE SULFATE (PF) 4 MG/ML IV SOLN
4.0000 mg | Freq: Once | INTRAVENOUS | Status: AC
  Administered 2022-04-16: 4 mg via INTRAVENOUS
  Filled 2022-04-16: qty 1

## 2022-04-16 MED ORDER — ACETAMINOPHEN 650 MG RE SUPP: 650.0000 mg | Freq: Four times a day (QID) | RECTAL | Status: DC | PRN

## 2022-04-16 MED ORDER — MORPHINE SULFATE (PF) 2 MG/ML IV SOLN: 1.0000 mg | INTRAVENOUS | Status: DC | PRN

## 2022-04-16 MED ORDER — SODIUM CHLORIDE 0.9 % IV SOLN: INTRAVENOUS | Status: DC

## 2022-04-16 MED ORDER — PIPERACILLIN-TAZOBACTAM 3.375 G IVPB
3.3750 g | Freq: Three times a day (TID) | INTRAVENOUS | Status: DC
  Administered 2022-04-16 – 2022-04-23 (×20): 3.375 g via INTRAVENOUS
  Filled 2022-04-16 (×22): qty 50

## 2022-04-16 MED ORDER — POTASSIUM CHLORIDE 10 MEQ/100ML IV SOLN
10.0000 meq | INTRAVENOUS | Status: AC
  Administered 2022-04-17 (×5): 10 meq via INTRAVENOUS
  Filled 2022-04-16 (×6): qty 100

## 2022-04-16 MED ORDER — IOHEXOL 350 MG/ML SOLN
75.0000 mL | Freq: Once | INTRAVENOUS | Status: AC | PRN
  Administered 2022-04-16: 75 mL via INTRAVENOUS

## 2022-04-16 MED ORDER — LACTATED RINGERS IV BOLUS
1000.0000 mL | Freq: Once | INTRAVENOUS | Status: AC
  Administered 2022-04-16: 1000 mL via INTRAVENOUS

## 2022-04-16 MED ORDER — ACETAMINOPHEN 325 MG PO TABS: 650.0000 mg | ORAL_TABLET | Freq: Four times a day (QID) | ORAL | Status: DC | PRN

## 2022-04-16 MED ORDER — POTASSIUM CHLORIDE 10 MEQ/100ML IV SOLN
10.0000 meq | INTRAVENOUS | Status: AC
  Administered 2022-04-16 (×3): 10 meq via INTRAVENOUS
  Filled 2022-04-16 (×3): qty 100

## 2022-04-16 MED ORDER — ONDANSETRON HCL 4 MG/2ML IJ SOLN
4.0000 mg | Freq: Four times a day (QID) | INTRAMUSCULAR | Status: DC | PRN
  Administered 2022-04-17: 4 mg via INTRAVENOUS
  Filled 2022-04-16: qty 2

## 2022-04-16 NOTE — H&P (Signed)
History and Physical    Monica Pugh [id number] DOB: 07-28-1950 DOA: 04/16/2022  PCP: Bonnita Nasuti, MD  Patient coming from: Home  Chief Complaint: Abdominal pain  HPI: Monica Pugh is a 72 y.o. female with medical history significant of endometrial cancer, carotid artery occlusion, stroke, hyperlipidemia, anemia, sleep apnea not on CPAP, PVD, GERD, bilateral hearing loss, tobacco use.  Patient presents to the ED today with lower abdominal pain, nausea, and vomiting.  Afebrile.  Labs significant for WBC 14.7, hemoglobin 11.7 (stable), platelet count 419k, potassium 2.6, chloride 91, BUN 12, creatinine 0.8, no elevation of lipase or LFTs, UA not suggestive of infection, COVID/influenza/RSV PCR negative.   CT abdomen pelvis with contrast showing: "IMPRESSION: 1. Circumferential wall thickening involving the mid sigmoid colon with associated hyperemia suspicious for an infiltrative mass in this location. Associated infiltration within the sigmoid mesocolon suggests transmural infiltration. Associated pathologic adenopathy within the sigmoid mesentery and left periaortic lymph node group. No evidence of obstruction. Correlation with endoscopy is recommended for further evaluation.   Aortic Atherosclerosis (ICD10-I70.0)."  Patient received morphine, Zofran, 1 L LR bolus, Zosyn, and IV potassium 10 mEq x 3.  Patient was not hypoxic initially but reportedly oxygen saturation dropped to mid 80s after she was given morphine.  Placed on 1 L supplemental oxygen and oxygen saturation improved to high 90s.  Patient reports ongoing bilateral lower quadrant abdominal pain for the past 1 year.  For the past 3 days she is having nausea and vomiting and has not been able to eat. Patient reports history of endometrial cancer and history of total hysterectomy and bilateral salpingo-oophorectomy last year in April.  States she has not received any chemotherapy or radiation for her cancer.  Currently not on any  treatment.  States her last colonoscopy was 10 years ago in [place].  She denies weight loss.  Patient has no other complaints.  Denies fevers, cough, shortness of breath, or chest pain.  Review of Systems:  Review of Systems  All other systems reviewed and are negative.   Past Medical History:  Diagnosis Date   Anemia    Carotid artery occlusion    GERD (gastroesophageal reflux disease)    Hearing loss    bilateral - no hearing aids   Hyperlipidemia    Peripheral vascular disease (HCC)    Sleep apnea    does not use cpap   Stroke ([place])    Vitamin B 12 deficiency    Vitamin D deficiency     Past Surgical History:  Procedure Laterality Date   ABDOMINAL HYSTERECTOMY     COLONOSCOPY     IR ANGIO INTRA EXTRACRAN SEL COM CAROTID INNOMINATE UNI R MOD SED  05/11/2020   IR ANGIO INTRA EXTRACRAN SEL INTERNAL CAROTID UNI L MOD SED  05/11/2020   IR ANGIO INTRA EXTRACRAN SEL INTERNAL CAROTID UNI L MOD SED  06/08/2020   IR ANGIO INTRA EXTRACRAN SEL INTERNAL CAROTID UNI L MOD SED  06/26/2020   IR ANGIO INTRA EXTRACRAN SEL INTERNAL CAROTID UNI L MOD SED  06/09/2021   IR ANGIO VERTEBRAL SEL SUBCLAVIAN INNOMINATE BILAT MOD SED  05/11/2020   IR ANGIOGRAM EXTREMITY BILATERAL  06/08/2020   IR CT HEAD LTD  06/26/2020   IR ILIAC ART STENT INC PTA MOD SED  06/26/2020   IR ILIAC ART STENT INC PTA MOD SED  06/26/2020   IR INTRAVSC STENT CERV CAROTID W/EMB-PROT MOD SED INCL ANGIO  06/26/2020   IR IVUS EACH ADDITIONAL NON CORONARY VESSEL  06/26/2020   IR RADIOLOGIST EVAL & MGMT  05/28/2020   IR RADIOLOGIST EVAL & MGMT  07/15/2020   IR RADIOLOGIST EVAL & MGMT  07/27/2020   IR US GUIDE VASC ACCESS LEFT  06/26/2020   IR US GUIDE VASC ACCESS RIGHT  05/11/2020   IR US GUIDE VASC ACCESS RIGHT  06/08/2020   IR US GUIDE VASC ACCESS RIGHT  06/26/2020   IR US GUIDE VASC ACCESS RIGHT  06/09/2021   RADIOLOGY WITH ANESTHESIA N/A 05/09/2015   Procedure: RADIOLOGY WITH ANESTHESIA;  Surgeon: Luanne Bras,  MD;  Location: [facility];  Service: Radiology;  Laterality: N/A;   RADIOLOGY WITH ANESTHESIA N/A 06/08/2020   Procedure: IR WITH ANESTHESIA ANGIOPLASTY;  Surgeon: Luanne Bras, MD;  Location: [facility];  Service: Radiology;  Laterality: N/A;   RADIOLOGY WITH ANESTHESIA N/A 06/26/2020   Procedure: REVASCULATION;  Surgeon: Luanne Bras, MD;  Location: [place];  Service: Radiology;  Laterality: N/A;   TUBAL LIGATION       reports that she has been smoking cigarettes. She has a 20.00 pack-year smoking history. She has never used smokeless tobacco. She reports that she does not currently use alcohol. She reports that she does not use drugs.  Allergies  Allergen Reactions   Cefadroxil Other (See Comments)    unknown    Family History  Problem Relation Age of Onset   Cancer Mother    Hypertension Father    Heart attack Father    Cirrhosis Brother    Cancer Brother        In spine   Heart attack Brother     Prior to Admission medications   Medication Sig Start Date End Date Taking? Authorizing Provider  potassium chloride SA (KLOR-CON M) 20 MEQ tablet Take 1 tablet (20 mEq total) by mouth 2 (two) times daily for 14 days. 04/12/22 04/26/22  Barbee Cough, MD  acetaminophen (TYLENOL) 500 MG tablet Take 500 mg by mouth every 6 (six) hours as needed for moderate pain or headache.    [provider]  aspirin EC 81 MG tablet Take 81 mg by mouth in the morning. Swallow whole.    [provider]  atorvastatin (LIPITOR) 40 MG tablet Take 40 mg by mouth in the morning.    [provider]  Cholecalciferol (DIALYVITE VITAMIN D 5000) 125 MCG (5000 UT) capsule Take 5,000 Units by mouth daily.    [provider]  clopidogrel (PLAVIX) 75 MG tablet Take 1 tablet (75 mg total) by mouth daily with breakfast. 05/21/15   Angiulli, Lavon Paganini, PA-C  Cyanocobalamin (VITAMIN B-12) 2500 MCG SUBL Take 2,500 mcg by mouth in the morning.    [provider]  ibuprofen  (ADVIL) 800 MG tablet Take 800 mg by mouth 3 (three) times daily.    [provider]  pantoprazole (PROTONIX) 40 MG tablet Take 1 tablet (40 mg total) by mouth 2 (two) times daily. Patient taking differently: Take 40 mg by mouth in the morning. 05/21/15   Cathlyn Parsons, PA-C    Physical Exam: Vitals:   04/16/22 1728 04/16/22 1820 04/16/22 1915 04/16/22 2115  BP:  (!) 166/72 (!) 166/95   Pulse:  68 66   Resp:  14 13   Temp:    98.2 F (36.8 C)  TempSrc:    Oral  SpO2:  97% 92%   Weight: 82.6 kg     Height: '5\' 4"'$  (1.626 m)       Physical Exam Vitals reviewed.  Constitutional:      General: She is not in acute distress. HENT:     Head: Normocephalic and atraumatic.  Eyes:     Extraocular Movements: Extraocular movements intact.  Cardiovascular:     Rate and Rhythm: Normal rate and regular rhythm.     Pulses: Normal pulses.  Pulmonary:     Effort: Pulmonary effort is normal. No respiratory distress.     Breath sounds: Normal breath sounds. No wheezing or rales.  Abdominal:     General: Bowel sounds are normal. There is no distension.     Palpations: Abdomen is soft.     Tenderness: There is no abdominal tenderness.  Musculoskeletal:     Cervical back: Normal range of motion.     Right lower leg: No edema.     Left lower leg: No edema.  Skin:    General: Skin is warm and dry.  Neurological:     General: No focal deficit present.     Mental Status: She is alert and oriented to person, place, and time.     Labs on Admission: I have personally reviewed following labs and imaging studies  CBC: Recent Labs  Lab 04/16/22 1749  WBC 14.7*  NEUTROABS 11.1*  HGB 11.7*  HCT 38.2  MCV 76.4*  PLT 123XX123*   Basic Metabolic Panel: Recent Labs  Lab 04/16/22 1749  NA 135  K 2.6*  CL 91*  CO2 28  GLUCOSE 106*  BUN 12  CREATININE 0.87  CALCIUM 9.4   GFR: Estimated Creatinine Clearance: 62.6 mL/min (by C-G formula based on SCr of 0.87 mg/dL). Liver  Function Tests: Recent Labs  Lab 04/16/22 1749  AST 14*  ALT 8  ALKPHOS 111  BILITOT 0.6  PROT 6.5  ALBUMIN 3.3*   Recent Labs  Lab 04/16/22 1749  LIPASE 21   No results for input(s): "AMMONIA" in the last 168 hours. Coagulation Profile: No results for input(s): "INR", "PROTIME" in the last 168 hours. Cardiac Enzymes: No results for input(s): "CKTOTAL", "CKMB", "CKMBINDEX", "TROPONINI" in the last 168 hours. BNP (last 3 results) No results for input(s): "PROBNP" in the last 8760 hours. HbA1C: No results for input(s): "HGBA1C" in the last 72 hours. CBG: No results for input(s): "GLUCAP" in the last 168 hours. Lipid Profile: No results for input(s): "CHOL", "HDL", "LDLCALC", "TRIG", "CHOLHDL", "LDLDIRECT" in the last 72 hours. Thyroid Function Tests: No results for input(s): "TSH", "T4TOTAL", "FREET4", "T3FREE", "THYROIDAB" in the last 72 hours. Anemia Panel: No results for input(s): "VITAMINB12", "FOLATE", "FERRITIN", "TIBC", "IRON", "RETICCTPCT" in the last 72 hours. Urine analysis:    Component Value Date/Time   COLORURINE AMBER (A) 04/16/2022 1751   APPEARANCEUR HAZY (A) 04/16/2022 1751   LABSPEC 1.035 (H) 04/16/2022 1751   PHURINE 5.0 04/16/2022 1751   GLUCOSEU NEGATIVE 04/16/2022 1751   HGBUR NEGATIVE 04/16/2022 1751   BILIRUBINUR SMALL (A) 04/16/2022 1751   KETONESUR NEGATIVE 04/16/2022 1751   PROTEINUR 30 (A) 04/16/2022 1751   NITRITE NEGATIVE 04/16/2022 1751   LEUKOCYTESUR NEGATIVE 04/16/2022 1751    Radiological Exams on Admission: CT Abdomen Pelvis W Contrast  Result Date: 04/16/2022 CLINICAL DATA:  Lower abdominal pain EXAM: CT ABDOMEN AND PELVIS WITH CONTRAST TECHNIQUE: Multidetector CT imaging of the abdomen and pelvis was performed using the standard protocol following bolus administration of intravenous contrast. RADIATION DOSE REDUCTION: This exam was performed according to the departmental dose-optimization program which includes automated exposure  control, adjustment of the mA and/or kV according to patient size  and/or use of iterative reconstruction technique. CONTRAST:  23m OMNIPAQUE IOHEXOL 350 MG/ML SOLN COMPARISON:  04/05/2021 FINDINGS: Lower chest: No acute abnormality.  Small hiatal hernia. Hepatobiliary: Stable hypodensities within segment 4B and segment 5 likely representing small hepatic cysts. Liver otherwise unremarkable. No intra or extrahepatic biliary ductal dilation. Gallbladder unremarkable. Pancreas: Unremarkable Spleen: Unremarkable Adrenals/Urinary Tract: The adrenal glands are unremarkable. The kidneys are normal. The bladder is unremarkable. Stomach/Bowel: There is circumferential wall thickening involving the mid sigmoid colon with associated hyperemia suspicious for an infiltrative mass in this location. Focal inflammatory changes as can be seen with inflammatory bowel disease could appear similarly. There is infiltration within the adjacent sigmoid mesentery best seen on axial image # 69/3 suspicious for transmural infiltration. This infiltrative change appears to abut several loops of small bowel within the deep pelvis, best seen on sagittal image # 91/7. Mild proximal sigmoid diverticulosis. No evidence of obstruction. Stomach, small bowel, and large bowel are otherwise unremarkable. Appendix normal. Vascular/Lymphatic: There is pathologic left periaortic lymphadenopathy with an index lymph node measuring 2.1 x 2.5 cm at axial image # 36/3. 6 mm enhancing nodule within the sigmoid mesocolon and axial image # 72/3 likely represents a pathologic mesenteric lymph node or small omental implant. Moderate aortoiliac atherosclerotic calcification. Bilateral common iliac artery stenting has been performed. No aortic aneurysm. Reproductive: Status post hysterectomy. No adnexal masses. Other: Small fat containing left inguinal hernia. Musculoskeletal: No acute bone abnormality. No lytic or blastic bone lesion. IMPRESSION: 1. Circumferential  wall thickening involving the mid sigmoid colon with associated hyperemia suspicious for an infiltrative mass in this location. Associated infiltration within the sigmoid mesocolon suggests transmural infiltration. Associated pathologic adenopathy within the sigmoid mesentery and left periaortic lymph node group. No evidence of obstruction. Correlation with endoscopy is recommended for further evaluation. Aortic Atherosclerosis (ICD10-I70.0). Electronically Signed   By: AFidela SalisburyM.D.   On: 04/16/2022 20:44    EKG: Independently reviewed.  Ectopic atrial rhythm, QTc 464.  Assessment and Plan  Suspected infiltrative mass of the mid sigmoid colon Patient with history of endometrial cancer diagnosed in February 2023 status post total laparoscopic hysterectomy and bilateral salpingo-oophorectomy in April 2023.  Per oncology note, she had declined pursuing any adjuvant therapy.  No prior colonoscopy results in the chart.  Patient reports history [place] today showing circumferential wall thickening involving the mid sigmoid colon with associated hyperemia suspicious for an infiltrative mass in this location. Associated infiltration within the sigmoid mesocolon suggests transmural infiltration. Associated pathologic adenopathy within the sigmoid mesentery and left periaortic lymph node group. No evidence of obstruction.  WBC count 14.7 and patient received Zosyn in the ED. ?Infectious etiology. Continue antibiotic at this time, IV fluid hydration, and IV analgesic as needed.  Monitor WBC count.  Consult GI and oncology in the morning.  Nausea and vomiting Possibly secondary to problem listed above. No elevation of lipase or LFTs, UA not suggestive of infection, COVID/influenza/RSV PCR negative.  Patient is not actively vomiting and able to tolerate some oral fluids in the ED.  Will order clear liquid diet at this time. Continue IV fluid hydration, antiemetic as  needed.    Hypokalemia In the setting of vomiting and poor p.o. intake.  Continue cardiac monitoring.  Monitor potassium and magnesium levels, continue to replace as needed.  Hypoxemia Patient was not hypoxemic initially and oxygen saturation reportedly dropped to mid 80s in the ED after she was given morphine.  Currently  satting in the upper 90s on 1 L supplemental oxygen.  No respiratory distress.  Lungs clear on exam.  No acute abnormality seen in the lower chest on CT abdomen pelvis.  Will obtain chest x-ray.  Continuous pulse ox, continue supplemental oxygen as needed.  History of stroke PVD Hyperlipidemia GERD Pharmacy med rec pending.  DVT prophylaxis: SCDs Code Status: DNR/DNI (discussed with the patient) Family Communication: No family available at this time. Level of care: Telemetry bed Admission status: It is my clinical opinion that referral for OBSERVATION is reasonable and necessary in this patient based on the above information provided. The aforementioned taken together are felt to place the patient at high risk for further clinical deterioration. However, it is anticipated that the patient may be medically stable for discharge from the hospital within 24 to 48 hours.   Shela Leff MD [facility]  If 7PM-7AM, please contact night-coverage [email or web address]  04/16/2022, 10:21 PM

## 2022-04-16 NOTE — ED Triage Notes (Addendum)
Pt c/o n/v for the past week with inability to keep food/fluids down. Pt also c/o abdominal pain since hysterectomy in may 2023.

## 2022-04-16 NOTE — ED Notes (Signed)
Pt given snack, denies complaints. No acute distress noted.

## 2022-04-16 NOTE — ED Notes (Addendum)
Pt medicated for pain, denies any other needs. Placed on 1L [place] due to o2 saturation dropping to 85 after morphine dose, MD made aware. Call bell in reach, family @ bedside.

## 2022-04-16 NOTE — ED Provider Triage Note (Cosign Needed Addendum)
Emergency Medicine Provider Triage Evaluation Note  Monica Pugh , a 72 y.o. female  was evaluated in triage.  Pt complains of vomiting and vaginal pain.  Patient is a history of ovarian cancer and had a hysterectomy in May 2023 and has a chronic lower abdominal pain since then.  For the past 3 days patient has had persistent nausea and vomiting.  Emesis has been nonbloody.  Patient has been unable to keep food or fluids down.  Patient denied chest pain, shortness of breath, syncope, headache, fevers, sick contacts, blood thinners  Review of Systems  Positive: See HPI Negative: See HPI  Physical Exam  BP (!) 182/64 (BP Location: Right Arm)   Pulse 74   Temp 98.5 F (36.9 C)   Resp 16   Ht '5\' 4"'$  (1.626 m)   Wt 82.6 kg   LMP  (LMP Unknown)   SpO2 93%   BMI 31.24 kg/m  Gen:   Awake, no distress   Resp:  Normal effort  MSK:   Moves extremities without difficulty  Other:  Abdomen nontender to palpation, heart rate regular without murmurs rubs or gallops, lungs clear to auscultation bilaterally, 5 out of 5 bilateral grip strength, 2+ bilateral radial pulses with regular rate, sensation intact in both hands  Medical Decision Making  Medically screening exam initiated at 5:42 PM.  Appropriate orders placed.  Monica Pugh was informed that the remainder of the evaluation will be completed by another provider, this initial triage assessment does not replace that evaluation, and the importance of remaining in the ED until their evaluation is complete.  Workup initiated, patient is currently hypertensive at 182/64 but stable     Chuck Hint, [place] 04/16/22 1752

## 2022-04-16 NOTE — ED Provider Notes (Signed)
[facility] Provider Note   CSN: [id number] Arrival date & time: 04/16/22  1709     History  Chief Complaint  Patient presents with   Emesis    Monica Pugh is a 72 y.o. female.  Patient is a 72 year old female with a history of stroke, anemia, PVD and carotid artery occlusion status post stents in neck and groin bilaterally, ovarian cancer status post resection without additional intervention last May who has had ongoing pain in her right lower abdomen for the last several months planning on getting follow-up with oncology next week but presenting today due to complaint of nausea vomiting unable to hold anything down since Thursday which was 3 days prior to arrival.  She reports she is vomiting between 5 and 10 times a day depending if she tries to eat.  She is also having persistent lower abdominal pain that is unchanged.  Her husband reports she does suffer from a lot of constipation they have been doing MiraLAX and stool softeners/suppositories and she reports most the time her stool is a thick yellow color.  She denies fever, shortness of breath, chest pain.  No urinary complaints.  The history is provided by the patient, the spouse and medical records.  Emesis      Home Medications Prior to Admission medications   Medication Sig Start Date End Date Taking? Authorizing Provider  potassium chloride SA (KLOR-CON M) 20 MEQ tablet Take 1 tablet (20 mEq total) by mouth 2 (two) times daily for 14 days. 04/12/22 04/26/22  Barbee Cough, MD  acetaminophen (TYLENOL) 500 MG tablet Take 500 mg by mouth every 6 (six) hours as needed for moderate pain or headache.    [provider]  aspirin EC 81 MG tablet Take 81 mg by mouth in the morning. Swallow whole.    [provider]  atorvastatin (LIPITOR) 40 MG tablet Take 40 mg by mouth in the morning.    [provider]  Cholecalciferol (DIALYVITE VITAMIN D 5000) 125 MCG  (5000 UT) capsule Take 5,000 Units by mouth daily.    [provider]  clopidogrel (PLAVIX) 75 MG tablet Take 1 tablet (75 mg total) by mouth daily with breakfast. 05/21/15   Angiulli, Lavon Paganini, PA-C  Cyanocobalamin (VITAMIN B-12) 2500 MCG SUBL Take 2,500 mcg by mouth in the morning.    [provider]  ibuprofen (ADVIL) 800 MG tablet Take 800 mg by mouth 3 (three) times daily.    [provider]  pantoprazole (PROTONIX) 40 MG tablet Take 1 tablet (40 mg total) by mouth 2 (two) times daily. Patient taking differently: Take 40 mg by mouth in the morning. 05/21/15   Angiulli, Lavon Paganini, PA-C      Allergies    Cefadroxil    Review of Systems   Review of Systems  Gastrointestinal:  Positive for vomiting.    Physical Exam Updated Vital Signs BP (!) 166/72 (BP Location: Right Arm)   Pulse 68   Temp 98.2 F (36.8 C) (Oral)   Resp 14   Ht '5\' 4"'$  (1.626 m)   Wt 82.6 kg   LMP  (LMP Unknown)   SpO2 97%   BMI 31.24 kg/m  Physical Exam Vitals and nursing note reviewed.  Constitutional:      General: She is not in acute distress.    Appearance: She is well-developed.  HENT:     Head: Normocephalic and atraumatic.     Mouth/Throat:  Mouth: Mucous membranes are dry.  Eyes:     Pupils: Pupils are equal, round, and reactive to light.  Cardiovascular:     Rate and Rhythm: Normal rate and regular rhythm.     Heart sounds: Normal heart sounds. No murmur heard.    No friction rub.  Pulmonary:     Effort: Pulmonary effort is normal.     Breath sounds: Normal breath sounds. No wheezing or rales.  Abdominal:     General: Bowel sounds are decreased. There is no distension.     Palpations: Abdomen is soft.     Tenderness: There is abdominal tenderness in the right lower quadrant, suprapubic area and left lower quadrant. There is guarding. There is no rebound.  Musculoskeletal:        General: No tenderness. Normal range of motion.     Right lower leg: No edema.      Left lower leg: No edema.     Comments: No edema  Skin:    General: Skin is warm and dry.     Findings: No rash.  Neurological:     Mental Status: She is alert and oriented to person, place, and time. Mental status is at baseline.     Cranial Nerves: No cranial nerve deficit.  Psychiatric:        Behavior: Behavior normal.     ED Results / Procedures / Treatments   Labs (all labs ordered are listed, but only abnormal results are displayed) Labs Reviewed  CBC WITH DIFFERENTIAL/PLATELET - Abnormal; Notable for the following components:      Result Value   WBC 14.7 (*)    Hemoglobin 11.7 (*)    MCV 76.4 (*)    MCH 23.4 (*)    RDW 20.0 (*)    Platelets 419 (*)    Neutro Abs 11.1 (*)    All other components within normal limits  COMPREHENSIVE METABOLIC PANEL - Abnormal; Notable for the following components:   Potassium 2.6 (*)    Chloride 91 (*)    Glucose, Bld 106 (*)    Albumin 3.3 (*)    AST 14 (*)    Anion gap 16 (*)    All other components within normal limits  URINALYSIS, ROUTINE W REFLEX MICROSCOPIC - Abnormal; Notable for the following components:   Color, Urine AMBER (*)    APPearance HAZY (*)    Specific Gravity, Urine 1.035 (*)    Bilirubin Urine SMALL (*)    Protein, ur 30 (*)    Bacteria, UA RARE (*)    All other components within normal limits  RESP PANEL BY RT-PCR (RSV, FLU A&B, COVID)  RVPGX2  LIPASE, BLOOD    EKG EKG Interpretation  Date/Time:  Saturday April 16 2022 17:53:57 EST Ventricular Rate:  75 PR Interval:  110 QRS Duration: 76 QT Interval:  416 QTC Calculation: 464 R Axis:   63 Text Interpretation: Unusual P axis, possible ectopic atrial rhythm Nonspecific ST abnormality No significant change since last tracing When compared with ECG of 09-May-2015 12:12, PREVIOUS ECG IS PRESENT Confirmed by Blanchie Dessert [phone]) on 04/16/2022 6:46:30 PM  Radiology CT Abdomen Pelvis W Contrast  Result Date: 04/16/2022 CLINICAL DATA:  Lower  abdominal pain EXAM: CT ABDOMEN AND PELVIS WITH CONTRAST TECHNIQUE: Multidetector CT imaging of the abdomen and pelvis was performed using the standard protocol following bolus administration of intravenous contrast. RADIATION DOSE REDUCTION: This exam was performed according to the departmental dose-optimization program which includes automated exposure control,  adjustment of the mA and/or kV according to patient size and/or use of iterative reconstruction technique. CONTRAST:  28m OMNIPAQUE IOHEXOL 350 MG/ML SOLN COMPARISON:  04/05/2021 FINDINGS: Lower chest: No acute abnormality.  Small hiatal hernia. Hepatobiliary: Stable hypodensities within segment 4B and segment 5 likely representing small hepatic cysts. Liver otherwise unremarkable. No intra or extrahepatic biliary ductal dilation. Gallbladder unremarkable. Pancreas: Unremarkable Spleen: Unremarkable Adrenals/Urinary Tract: The adrenal glands are unremarkable. The kidneys are normal. The bladder is unremarkable. Stomach/Bowel: There is circumferential wall thickening involving the mid sigmoid colon with associated hyperemia suspicious for an infiltrative mass in this location. Focal inflammatory changes as can be seen with inflammatory bowel disease could appear similarly. There is infiltration within the adjacent sigmoid mesentery best seen on axial image # 69/3 suspicious for transmural infiltration. This infiltrative change appears to abut several loops of small bowel within the deep pelvis, best seen on sagittal image # 91/7. Mild proximal sigmoid diverticulosis. No evidence of obstruction. Stomach, small bowel, and large bowel are otherwise unremarkable. Appendix normal. Vascular/Lymphatic: There is pathologic left periaortic lymphadenopathy with an index lymph node measuring 2.1 x 2.5 cm at axial image # 36/3. 6 mm enhancing nodule within the sigmoid mesocolon and axial image # 72/3 likely represents a pathologic mesenteric lymph node or small omental  implant. Moderate aortoiliac atherosclerotic calcification. Bilateral common iliac artery stenting has been performed. No aortic aneurysm. Reproductive: Status post hysterectomy. No adnexal masses. Other: Small fat containing left inguinal hernia. Musculoskeletal: No acute bone abnormality. No lytic or blastic bone lesion. IMPRESSION: 1. Circumferential wall thickening involving the mid sigmoid colon with associated hyperemia suspicious for an infiltrative mass in this location. Associated infiltration within the sigmoid mesocolon suggests transmural infiltration. Associated pathologic adenopathy within the sigmoid mesentery and left periaortic lymph node group. No evidence of obstruction. Correlation with endoscopy is recommended for further evaluation. Aortic Atherosclerosis (ICD10-I70.0). Electronically Signed   By: AFidela SalisburyM.D.   On: 04/16/2022 20:44    Procedures Procedures    Medications Ordered in ED Medications  potassium chloride 10 mEq in 100 mL IVPB (10 mEq Intravenous New Bag/Given 04/16/22 2022)  ondansetron (ZOFRAN-ODT) disintegrating tablet 4 mg (4 mg Oral Given 04/16/22 1759)  lactated ringers bolus 1,000 mL (0 mLs Intravenous Stopped 04/16/22 2023)  morphine (PF) 4 MG/ML injection 4 mg (4 mg Intravenous Given 04/16/22 2017)  iohexol (OMNIPAQUE) 350 MG/ML injection 75 mL (75 mLs Intravenous Contrast Given 04/16/22 2013)    ED Course/ Medical Decision Making/ A&P                             Medical Decision Making Amount and/or Complexity of Data Reviewed Independent Historian: spouse External Data Reviewed: notes. Labs: ordered. Decision-making details documented in ED Course. Radiology: ordered and independent interpretation performed. Decision-making details documented in ED Course. ECG/medicine tests: ordered and independent interpretation performed. Decision-making details documented in ED Course.  Risk Prescription drug management. Decision regarding  hospitalization.   Pt with multiple medical problems and comorbidities and presenting today with a complaint that caries a high risk for morbidity and mortality.  Here today with complaint of abdominal pain that is worsening and now nausea vomiting unable to tolerate p.o.'s for the last 3 days.  Concern for recurrent cancer, bowel obstruction, appendicitis, diverticulitis, pancreatitis.  Patient has already received antiemetics and was given IV fluids.  She refused pain medication at this time.  I independently interpreted patient's labs and EKG  today.  CBC with a leukocytosis of 14 with a normal hemoglobin and platelet count, UA without significant findings, CMP with hypokalemia today of 2.6 with normal LFT's and lipase.  Viral panel neg. EKG without acute findings today.  cT abd/pelvis pending for further eval.  9:21 PM Given pt's hypokalemia she was replaced with IV potassium due to not tolerating po's.  I have independently visualized and interpreted pt's images today.  CT today with mass present in the sigmoid colon.  Radiology reports circumferential wall thickening with associated hyperemia suspicious for infiltrative mass in this location associated infiltration within the sigmoid mesocolon suggestive of transmural infiltration.  As well as some enlarged lymph nodes.  Given patient's elevated white count we will cover for possible infectious etiology.  However will admit given patient's intractable nausea vomiting, hypokalemia and new mass.  Findings discussed with the patient and her husband.  At this time they are comfortable with this plan.  Hospitalist consulted for admission.  CRITICAL CARE Performed by: Farzana Koci Total critical care time: 30 minutes Critical care time was exclusive of separately billable procedures and treating other patients. Critical care was necessary to treat or prevent imminent or life-threatening deterioration. Critical care was time spent personally by me on  the following activities: development of treatment plan with patient and/or surrogate as well as nursing, discussions with consultants, evaluation of patient's response to treatment, examination of patient, obtaining history from patient or surrogate, ordering and performing treatments and interventions, ordering and review of laboratory studies, ordering and review of radiographic studies, pulse oximetry and re-evaluation of patient's condition.         Final Clinical Impression(s) / ED Diagnoses Final diagnoses:  Colonic mass  Hypokalemia  Intractable nausea and vomiting    Rx / DC Orders ED Discharge Orders     None         Blanchie Dessert, MD 04/16/22 2123

## 2022-04-16 NOTE — ED Notes (Signed)
Pt report received from previous nurse. Pt A&O x4, vitals stable, states she has stomach pain. Call bell in reach. No other acute distress noted.

## 2022-04-16 NOTE — Progress Notes (Signed)
Pharmacy Antibiotic Note  Monica Pugh is a 72 y.o. female admitted on 04/16/2022 presenting with N/V, abdominal pain, concern for intra-abdominal infection.  Pharmacy has been consulted for zosyn dosing.  Plan: Zosyn 3.375g IV every 8 hours (extended 4h infusion) Monitor renal function, clinical progression and LOT  Height: '5\' 4"'$  (162.6 cm) Weight: 82.6 kg (182 lb) IBW/kg (Calculated) : 54.7  Temp (24hrs), Avg:98.4 F (36.9 C), Min:98.2 F (36.8 C), Max:98.5 F (36.9 C)  Recent Labs  Lab 04/16/22 1749  WBC 14.7*  CREATININE 0.87    Estimated Creatinine Clearance: 62.6 mL/min (by C-G formula based on SCr of 0.87 mg/dL).    Allergies  Allergen Reactions   Cefadroxil Other (See Comments)    unknown    Bertis Ruddy, PharmD, [facility] Clinical Pharmacist ED Pharmacist Phone # [phone] 04/16/2022 9:27 PM

## 2022-04-17 ENCOUNTER — Observation Stay (HOSPITAL_COMMUNITY): Payer: Medicare HMO

## 2022-04-17 DIAGNOSIS — E876 Hypokalemia: Secondary | ICD-10-CM | POA: Diagnosis present

## 2022-04-17 DIAGNOSIS — C7801 Secondary malignant neoplasm of right lung: Secondary | ICD-10-CM | POA: Diagnosis present

## 2022-04-17 DIAGNOSIS — Z515 Encounter for palliative care: Secondary | ICD-10-CM | POA: Diagnosis not present

## 2022-04-17 DIAGNOSIS — K6389 Other specified diseases of intestine: Secondary | ICD-10-CM

## 2022-04-17 DIAGNOSIS — Z888 Allergy status to other drugs, medicaments and biological substances status: Secondary | ICD-10-CM | POA: Diagnosis not present

## 2022-04-17 DIAGNOSIS — G4733 Obstructive sleep apnea (adult) (pediatric): Secondary | ICD-10-CM | POA: Diagnosis present

## 2022-04-17 DIAGNOSIS — K648 Other hemorrhoids: Secondary | ICD-10-CM | POA: Diagnosis not present

## 2022-04-17 DIAGNOSIS — C771 Secondary and unspecified malignant neoplasm of intrathoracic lymph nodes: Secondary | ICD-10-CM | POA: Diagnosis present

## 2022-04-17 DIAGNOSIS — I700 Atherosclerosis of aorta: Secondary | ICD-10-CM | POA: Diagnosis present

## 2022-04-17 DIAGNOSIS — C187 Malignant neoplasm of sigmoid colon: Secondary | ICD-10-CM | POA: Diagnosis not present

## 2022-04-17 DIAGNOSIS — Z6833 Body mass index (BMI) 33.0-33.9, adult: Secondary | ICD-10-CM | POA: Diagnosis not present

## 2022-04-17 DIAGNOSIS — R194 Change in bowel habit: Secondary | ICD-10-CM

## 2022-04-17 DIAGNOSIS — E669 Obesity, unspecified: Secondary | ICD-10-CM | POA: Diagnosis present

## 2022-04-17 DIAGNOSIS — R112 Nausea with vomiting, unspecified: Secondary | ICD-10-CM | POA: Diagnosis not present

## 2022-04-17 DIAGNOSIS — Z7189 Other specified counseling: Secondary | ICD-10-CM | POA: Diagnosis not present

## 2022-04-17 DIAGNOSIS — J9811 Atelectasis: Secondary | ICD-10-CM | POA: Diagnosis not present

## 2022-04-17 DIAGNOSIS — I119 Hypertensive heart disease without heart failure: Secondary | ICD-10-CM | POA: Diagnosis present

## 2022-04-17 DIAGNOSIS — K921 Melena: Secondary | ICD-10-CM | POA: Diagnosis present

## 2022-04-17 DIAGNOSIS — D509 Iron deficiency anemia, unspecified: Secondary | ICD-10-CM | POA: Diagnosis not present

## 2022-04-17 DIAGNOSIS — Z1152 Encounter for screening for COVID-19: Secondary | ICD-10-CM | POA: Diagnosis not present

## 2022-04-17 DIAGNOSIS — G4730 Sleep apnea, unspecified: Secondary | ICD-10-CM | POA: Diagnosis not present

## 2022-04-17 DIAGNOSIS — K219 Gastro-esophageal reflux disease without esophagitis: Secondary | ICD-10-CM | POA: Diagnosis not present

## 2022-04-17 DIAGNOSIS — I6529 Occlusion and stenosis of unspecified carotid artery: Secondary | ICD-10-CM | POA: Diagnosis not present

## 2022-04-17 DIAGNOSIS — K5669 Other partial intestinal obstruction: Secondary | ICD-10-CM | POA: Diagnosis not present

## 2022-04-17 DIAGNOSIS — D630 Anemia in neoplastic disease: Secondary | ICD-10-CM | POA: Diagnosis not present

## 2022-04-17 DIAGNOSIS — I6523 Occlusion and stenosis of bilateral carotid arteries: Secondary | ICD-10-CM | POA: Diagnosis present

## 2022-04-17 DIAGNOSIS — E785 Hyperlipidemia, unspecified: Secondary | ICD-10-CM | POA: Diagnosis present

## 2022-04-17 DIAGNOSIS — J9601 Acute respiratory failure with hypoxia: Secondary | ICD-10-CM | POA: Diagnosis not present

## 2022-04-17 DIAGNOSIS — C7802 Secondary malignant neoplasm of left lung: Secondary | ICD-10-CM | POA: Diagnosis present

## 2022-04-17 DIAGNOSIS — D75839 Thrombocytosis, unspecified: Secondary | ICD-10-CM | POA: Diagnosis present

## 2022-04-17 DIAGNOSIS — Z66 Do not resuscitate: Secondary | ICD-10-CM | POA: Diagnosis present

## 2022-04-17 DIAGNOSIS — R0609 Other forms of dyspnea: Secondary | ICD-10-CM | POA: Diagnosis not present

## 2022-04-17 DIAGNOSIS — F17210 Nicotine dependence, cigarettes, uncomplicated: Secondary | ICD-10-CM | POA: Diagnosis present

## 2022-04-17 LAB — BASIC METABOLIC PANEL
Anion gap: 10 (ref 5–15)
BUN: 8 mg/dL (ref 8–23)
CO2: 28 mmol/L (ref 22–32)
Calcium: 8.4 mg/dL — ABNORMAL LOW (ref 8.9–10.3)
Chloride: 96 mmol/L — ABNORMAL LOW (ref 98–111)
Creatinine, Ser: 0.79 mg/dL (ref 0.44–1.00)
GFR, Estimated: >60 mL/min (ref >60)
Glucose, Bld: 112 mg/dL — ABNORMAL HIGH (ref 70–99)
Potassium: 3.1 mmol/L — ABNORMAL LOW (ref 3.5–5.1)
Sodium: 134 mmol/L — ABNORMAL LOW (ref 135–145)

## 2022-04-17 LAB — IRON AND TIBC
Iron: 26 ug/dL — ABNORMAL LOW (ref 28–170)
Saturation Ratios: 7 % — ABNORMAL LOW (ref 10.4–31.8)
TIBC: 385 ug/dL (ref 250–450)
UIBC: 359 ug/dL

## 2022-04-17 LAB — CBC
HCT: 33.3 % — ABNORMAL LOW (ref 36.0–46.0)
Hemoglobin: 10.6 g/dL — ABNORMAL LOW (ref 12.0–15.0)
MCH: 24.3 pg — ABNORMAL LOW (ref 26.0–34.0)
MCHC: 31.8 g/dL (ref 30.0–36.0)
MCV: 76.2 fL — ABNORMAL LOW (ref 80.0–100.0)
Platelets: 353 10*3/uL (ref 150–400)
RBC: 4.37 MIL/uL (ref 3.87–5.11)
RDW: 19.6 % — ABNORMAL HIGH (ref 11.5–15.5)
WBC: 13.1 10*3/uL — ABNORMAL HIGH (ref 4.0–10.5)
nRBC: 0 % (ref 0.0–0.2)

## 2022-04-17 LAB — RETICULOCYTES
Immature Retic Fract: 13.4 % (ref 2.3–15.9)
RBC.: 4.29 MIL/uL (ref 3.87–5.11)
Retic Count, Absolute: 51.1 10*3/uL (ref 19.0–186.0)
Retic Ct Pct: 1.2 % (ref 0.4–3.1)

## 2022-04-17 LAB — MAGNESIUM: Magnesium: 1.7 mg/dL (ref 1.7–2.4)

## 2022-04-17 LAB — FOLATE: Folate: 9.3 ng/mL (ref >5.9)

## 2022-04-17 LAB — HIV ANTIBODY (ROUTINE TESTING W REFLEX): HIV Screen 4th Generation wRfx: NONREACTIVE

## 2022-04-17 LAB — VITAMIN B12: Vitamin B-12: 1119 pg/mL — ABNORMAL HIGH (ref 180–914)

## 2022-04-17 LAB — FERRITIN: Ferritin: 14 ng/mL (ref 11–307)

## 2022-04-17 MED ORDER — POTASSIUM CHLORIDE CRYS ER 20 MEQ PO TBCR
40.0000 meq | EXTENDED_RELEASE_TABLET | ORAL | Status: AC
  Administered 2022-04-17 (×2): 40 meq via ORAL
  Filled 2022-04-17 (×2): qty 2

## 2022-04-17 MED ORDER — ATORVASTATIN CALCIUM 40 MG PO TABS
40.0000 mg | ORAL_TABLET | Freq: Every morning | ORAL | Status: DC
  Administered 2022-04-18 – 2022-04-25 (×8): 40 mg via ORAL
  Filled 2022-04-17 (×8): qty 1

## 2022-04-17 MED ORDER — GUAIFENESIN ER 600 MG PO TB12
600.0000 mg | ORAL_TABLET | Freq: Two times a day (BID) | ORAL | Status: DC
  Administered 2022-04-17 – 2022-04-18 (×4): 600 mg via ORAL
  Filled 2022-04-17 (×4): qty 1

## 2022-04-17 MED ORDER — MAGNESIUM SULFATE 2 GM/50ML IV SOLN
2.0000 g | Freq: Once | INTRAVENOUS | Status: AC
  Administered 2022-04-17: 2 g via INTRAVENOUS
  Filled 2022-04-17: qty 50

## 2022-04-17 MED ORDER — SODIUM CHLORIDE 0.9 % IV SOLN: INTRAVENOUS | Status: DC

## 2022-04-17 MED ORDER — PANTOPRAZOLE SODIUM 40 MG IV SOLR
40.0000 mg | Freq: Two times a day (BID) | INTRAVENOUS | Status: DC
  Administered 2022-04-17 – 2022-04-24 (×15): 40 mg via INTRAVENOUS
  Filled 2022-04-17 (×14): qty 10

## 2022-04-17 MED ORDER — BISACODYL 5 MG PO TBEC
20.0000 mg | DELAYED_RELEASE_TABLET | Freq: Once | ORAL | Status: AC
  Administered 2022-04-17: 20 mg via ORAL
  Filled 2022-04-17: qty 4

## 2022-04-17 MED ORDER — VITAMIN D 25 MCG (1000 UNIT) PO TABS
5000.0000 [IU] | ORAL_TABLET | Freq: Every day | ORAL | Status: DC
  Administered 2022-04-17 – 2022-04-25 (×9): 5000 [IU] via ORAL
  Filled 2022-04-17 (×9): qty 5

## 2022-04-17 MED ORDER — HYDRALAZINE HCL 20 MG/ML IJ SOLN
5.0000 mg | Freq: Four times a day (QID) | INTRAMUSCULAR | Status: DC | PRN
  Administered 2022-04-22 – 2022-04-25 (×2): 5 mg via INTRAVENOUS
  Filled 2022-04-17 (×2): qty 1

## 2022-04-17 MED ORDER — IPRATROPIUM-ALBUTEROL 0.5-2.5 (3) MG/3ML IN SOLN
3.0000 mL | Freq: Four times a day (QID) | RESPIRATORY_TRACT | Status: DC
  Administered 2022-04-17: 3 mL via RESPIRATORY_TRACT
  Filled 2022-04-17 (×2): qty 3

## 2022-04-17 MED ORDER — MORPHINE SULFATE (PF) 2 MG/ML IV SOLN
0.5000 mg | INTRAVENOUS | Status: DC | PRN
  Administered 2022-04-17 – 2022-04-24 (×18): 0.5 mg via INTRAVENOUS
  Filled 2022-04-17 (×18): qty 1

## 2022-04-17 MED ORDER — FERROUS SULFATE 325 (65 FE) MG PO TABS
325.0000 mg | ORAL_TABLET | Freq: Every day | ORAL | Status: DC
  Administered 2022-04-19 – 2022-04-25 (×6): 325 mg via ORAL
  Filled 2022-04-17 (×8): qty 1

## 2022-04-17 NOTE — Consult Note (Signed)
Referring Provider: No ref. provider found Primary Care Physician:  Bonnita Nasuti, MD Primary Gastroenterologist: Dr. Carmell Austria  Reason for Consultation: Colon mass on CT   IMPRESSION:  53-monthhistory of change in bowel habits now with acute nausea, vomiting, and abdominal pain.  Abdominal CT scan raising concerns for a colon mass with transmural infiltration. Colonoscopy recommended.   Long-term use of Plavix.  Plavix has been on hold since Wednesday.  Microcytic anemia.  Likely related to colon mass. Labs to further clarify the extent of suspected iron deficiency.  Thrombocytosis. Likely due to suspected iron deficiency.   History of benign colon polyps. Per patient report.   PLAN: Clear liquid diet today Anemia studies ordered Nothing to eat after midnight Continue to hold Plavix Colonoscopy with Dr. DLorenso Courier2/26/2024  I consented the patient discussing the risks, benefits, and alternatives to endoscopic evaluation. In particular, we discussed the risks that include, but are not limited to, reaction to medication, cardiopulmonary compromise, bleeding requiring blood transfusion, aspiration resulting in pneumonia, perforation requiring surgery, lack of diagnosis, severe illness requiring hospitalization, and even death.  In particular, she is at increased risk for perforation given the CT findings.  We reviewed the risk of missed lesion including polyps or even cancer. The patient acknowledges these risks and asks that we proceed.   HPI: Monica Locklearis a 72y.o. female seen in consultation's request of the hospitalist for further evaluation of an abnormal CT.  The history is obtained through the patient and review of her electronic health record.  She has a history of grade 2 endometrial cancer, carotid artery occlusion, stroke, hyperlipidemia, anemia, sleep apnea not on CPAP, peripheral vascular disease, reflux, bilateral hearing loss, and tobacco abuse.  She has had a total  hysterectomy and bilateral salpingo-oophorectomy.  She is on Plavix and her last dose was 04/13/2022.  For the last 6-12 months she has had bilateral lower abdominal pain with a change in bowel habits.  Her bowel movements have become more loose and mucousy.  There is been no bleeding.  She was admitted through the emergency room with abdominal pain, nausea, and vomiting.  Labs showed a hemoglobin of 11.7, MCV 76.4, RDW 20, platelets 419 and platelet count of 419.  Liver enzymes and lipase were normal.  CT of the abdomen and pelvis with contrast showed circumferential wall thickening involving the mid sigmoid colon concerning for an infiltrative mass.  There appears to be associated pathologic adenopathy within the sigmoid mesentery and a left periaortic lymph node group.  No evidence for obstruction.  Colonoscopy approximately 10 years ago with Dr. [place]yndel [place]  She reports benign polyps removed at that time.  She has been thinking is about time for another colonoscopy but her primary care doctor told her it is too early.  There is no known family history of colon cancer or polyps. No family history of stomach cancer or other GI malignancy. No family history of inflammatory bowel disease or celiac.    Past Medical History:  Diagnosis Date   Anemia    Carotid artery occlusion    GERD (gastroesophageal reflux disease)    Hearing loss    bilateral - no hearing aids   Hyperlipidemia    Intractable nausea and vomiting 04/16/2022   Peripheral vascular disease (HCC)    Sleep apnea    does not use cpap   Stroke ([facility]    Vitamin B 12 deficiency    Vitamin D deficiency  Past Surgical History:  Procedure Laterality Date   ABDOMINAL HYSTERECTOMY     COLONOSCOPY     IR ANGIO INTRA EXTRACRAN SEL COM CAROTID INNOMINATE UNI R MOD SED  05/11/2020   IR ANGIO INTRA EXTRACRAN SEL INTERNAL CAROTID UNI L MOD SED  05/11/2020   IR ANGIO INTRA EXTRACRAN SEL INTERNAL CAROTID UNI L MOD SED  06/08/2020    IR ANGIO INTRA EXTRACRAN SEL INTERNAL CAROTID UNI L MOD SED  06/26/2020   IR ANGIO INTRA EXTRACRAN SEL INTERNAL CAROTID UNI L MOD SED  06/09/2021   IR ANGIO VERTEBRAL SEL SUBCLAVIAN INNOMINATE BILAT MOD SED  05/11/2020   IR ANGIOGRAM EXTREMITY BILATERAL  06/08/2020   IR CT HEAD LTD  06/26/2020   IR ILIAC ART STENT INC PTA MOD SED  06/26/2020   IR ILIAC ART STENT INC PTA MOD SED  06/26/2020   IR INTRAVSC STENT CERV CAROTID W/EMB-PROT MOD SED INCL ANGIO  06/26/2020   IR IVUS EACH ADDITIONAL NON CORONARY VESSEL  06/26/2020   IR RADIOLOGIST EVAL & MGMT  05/28/2020   IR RADIOLOGIST EVAL & MGMT  07/15/2020   IR RADIOLOGIST EVAL & MGMT  07/27/2020   IR US GUIDE VASC ACCESS LEFT  06/26/2020   IR US GUIDE VASC ACCESS RIGHT  05/11/2020   IR US GUIDE VASC ACCESS RIGHT  06/08/2020   IR US GUIDE VASC ACCESS RIGHT  06/26/2020   IR US GUIDE VASC ACCESS RIGHT  06/09/2021   RADIOLOGY WITH ANESTHESIA N/A 05/09/2015   Procedure: RADIOLOGY WITH ANESTHESIA;  Surgeon: Luanne Bras, MD;  Location: [place];  Service: Radiology;  Laterality: N/A;   RADIOLOGY WITH ANESTHESIA N/A 06/08/2020   Procedure: IR WITH ANESTHESIA ANGIOPLASTY;  Surgeon: Luanne Bras, MD;  Location: [place];  Service: Radiology;  Laterality: N/A;   RADIOLOGY WITH ANESTHESIA N/A 06/26/2020   Procedure: REVASCULATION;  Surgeon: Luanne Bras, MD;  Location: [place];  Service: Radiology;  Laterality: N/A;   TUBAL LIGATION      Prior to Admission medications   Medication Sig Start Date End Date Taking? Authorizing Provider  acetaminophen (TYLENOL) 500 MG tablet Take 500 mg by mouth every 6 (six) hours as needed for moderate pain or headache.   Yes [provider]  aspirin EC 81 MG tablet Take 81 mg by mouth in the morning. Swallow whole.   Yes [provider]  atorvastatin (LIPITOR) 40 MG tablet Take 40 mg by mouth in the morning.   Yes [provider]  Cholecalciferol (DIALYVITE VITAMIN D 5000) 125 MCG  (5000 UT) capsule Take 5,000 Units by mouth daily.   Yes [provider]  clopidogrel (PLAVIX) 75 MG tablet Take 1 tablet (75 mg total) by mouth daily with breakfast. 05/21/15  Yes Angiulli, Lavon Paganini, PA-C  Cyanocobalamin (VITAMIN B-12) 2500 MCG SUBL Take 2,500 mcg by mouth in the morning.   Yes [provider]  ibuprofen (ADVIL) 800 MG tablet Take 800 mg by mouth every 8 (eight) hours as needed for moderate pain.   Yes [provider]  pantoprazole (PROTONIX) 40 MG tablet Take 1 tablet (40 mg total) by mouth 2 (two) times daily. Patient taking differently: Take 40 mg by mouth in the morning. 05/21/15  Yes Angiulli, Lavon Paganini, PA-C  potassium chloride SA (KLOR-CON M) 20 MEQ tablet Take 1 tablet (20 mEq total) by mouth 2 (two) times daily for 14 days. 04/12/22 04/26/22 Yes Barbee Cough, MD    Current Facility-Administered Medications  Medication Dose Route Frequency  Provider Last Rate Last Admin   0.9 %  sodium chloride infusion   Intravenous Continuous Regalado, Belkys A, MD       acetaminophen (TYLENOL) tablet 650 mg  650 mg Oral Q6H PRN Shela Leff, MD       Or   acetaminophen (TYLENOL) suppository 650 mg  650 mg Rectal Q6H PRN Shela Leff, MD       guaiFENesin (MUCINEX) 12 hr tablet 600 mg  600 mg Oral BID Regalado, Belkys A, MD       ipratropium-albuterol (DUONEB) 0.5-2.5 (3) MG/3ML nebulizer solution 3 mL  3 mL Nebulization QID Regalado, Belkys A, MD       magnesium sulfate IVPB 2 g 50 mL  2 g Intravenous Once Regalado, Belkys A, MD       morphine (PF) 2 MG/ML injection 0.5 mg  0.5 mg Intravenous Q4H PRN Regalado, Belkys A, MD       naloxone (NARCAN) injection 0.4 mg  0.4 mg Intravenous PRN Shela Leff, MD       ondansetron (ZOFRAN) injection 4 mg  4 mg Intravenous Q6H PRN Shela Leff, MD       pantoprazole (PROTONIX) injection 40 mg  40 mg Intravenous Q12H Regalado, Belkys A, MD       piperacillin-tazobactam (ZOSYN) IVPB 3.375 g   3.375 g Intravenous Q8H Shela Leff, MD 12.5 mL/hr at 04/17/22 0716 3.375 g at 04/17/22 0716   potassium chloride SA (KLOR-CON M) CR tablet 40 mEq  40 mEq Oral Q4H Regalado, Belkys A, MD       Current Outpatient Medications  Medication Sig Dispense Refill   acetaminophen (TYLENOL) 500 MG tablet Take 500 mg by mouth every 6 (six) hours as needed for moderate pain or headache.     aspirin EC 81 MG tablet Take 81 mg by mouth in the morning. Swallow whole.     atorvastatin (LIPITOR) 40 MG tablet Take 40 mg by mouth in the morning.     Cholecalciferol (DIALYVITE VITAMIN D 5000) 125 MCG (5000 UT) capsule Take 5,000 Units by mouth daily.     clopidogrel (PLAVIX) 75 MG tablet Take 1 tablet (75 mg total) by mouth daily with breakfast. 30 tablet 1   Cyanocobalamin (VITAMIN B-12) 2500 MCG SUBL Take 2,500 mcg by mouth in the morning.     ibuprofen (ADVIL) 800 MG tablet Take 800 mg by mouth every 8 (eight) hours as needed for moderate pain.     pantoprazole (PROTONIX) 40 MG tablet Take 1 tablet (40 mg total) by mouth 2 (two) times daily. (Patient taking differently: Take 40 mg by mouth in the morning.) 60 tablet 2   potassium chloride SA (KLOR-CON M) 20 MEQ tablet Take 1 tablet (20 mEq total) by mouth 2 (two) times daily for 14 days. 28 tablet 0    Allergies as of 04/16/2022 - Review Complete 04/16/2022  Allergen Reaction Noted   Cefadroxil Other (See Comments) 04/09/2021    Family History  Problem Relation Age of Onset   Cancer Mother    Hypertension Father    Heart attack Father    Cirrhosis Brother    Cancer Brother        In spine   Heart attack Brother       Physical Exam: General:   Alert,  well-nourished, pleasant and cooperative in NAD.  She is hard of hearing. Head:  Normocephalic and atraumatic. Eyes:  Sclera clear, no icterus.   Conjunctiva pink. Ears:  Normal auditory acuity. Nose:  No deformity, discharge,  or lesions. Mouth:  No deformity or lesions.   Neck:  Supple;  no masses or thyromegaly. Lungs:  Clear throughout to auscultation.   No wheezes. Heart:  Regular rate and rhythm; no murmurs. Abdomen:  Soft, show obesity, nontender, nondistended, normal bowel sounds, no rebound or guarding. No hepatosplenomegaly.   Rectal:  Deferred  Msk:  Symmetrical. No boney deformities LAD: No inguinal or umbilical LAD Extremities:  No clubbing or edema. Neurologic:  Alert and  oriented x4;  grossly nonfocal Skin:  Intact without significant lesions or rashes. Psych:  Alert and cooperative. Normal mood and affect.   Lab Results: Recent Labs    04/16/22 1749 04/17/22 0158  WBC 14.7* 13.1*  HGB 11.7* 10.6*  HCT 38.2 33.3*  PLT 419* 353   BMET Recent Labs    04/16/22 1749 04/17/22 0158  NA 135 134*  K 2.6* 3.1*  CL 91* 96*  CO2 28 28  GLUCOSE 106* 112*  BUN 12 8  CREATININE 0.87 0.79  CALCIUM 9.4 8.4*   LFT Recent Labs    04/16/22 1749  PROT 6.5  ALBUMIN 3.3*  AST 14*  ALT 8  ALKPHOS 111  BILITOT 0.6     Studies/Results: DG CHEST PORT 1 VIEW  Result Date: 04/17/2022 CLINICAL DATA:  Lower abdominal pain with nausea and vomiting. EXAM: PORTABLE CHEST 1 VIEW COMPARISON:  May 15, 2015 FINDINGS: The heart size and mediastinal contours are within normal limits. There is moderate severity calcification of the aortic arch. Low lung volumes are noted with mild atelectatic changes seen within the bilateral lung bases. There is no evidence of a pleural effusion or pneumothorax. A radiopaque stent is seen overlying the soft tissues of left neck. The visualized skeletal structures are unremarkable. IMPRESSION: Low lung volumes with mild bibasilar atelectasis. Electronically Signed   By: Virgina Norfolk M.D.   On: 04/17/2022 00:29   CT Abdomen Pelvis W Contrast  Result Date: 04/16/2022 CLINICAL DATA:  Lower abdominal pain EXAM: CT ABDOMEN AND PELVIS WITH CONTRAST TECHNIQUE: Multidetector CT imaging of the abdomen and pelvis was performed using the  standard protocol following bolus administration of intravenous contrast. RADIATION DOSE REDUCTION: This exam was performed according to the departmental dose-optimization program which includes automated exposure control, adjustment of the mA and/or kV according to patient size and/or use of iterative reconstruction technique. CONTRAST:  83m OMNIPAQUE IOHEXOL 350 MG/ML SOLN COMPARISON:  04/05/2021 FINDINGS: Lower chest: No acute abnormality.  Small hiatal hernia. Hepatobiliary: Stable hypodensities within segment 4B and segment 5 likely representing small hepatic cysts. Liver otherwise unremarkable. No intra or extrahepatic biliary ductal dilation. Gallbladder unremarkable. Pancreas: Unremarkable Spleen: Unremarkable Adrenals/Urinary Tract: The adrenal glands are unremarkable. The kidneys are normal. The bladder is unremarkable. Stomach/Bowel: There is circumferential wall thickening involving the mid sigmoid colon with associated hyperemia suspicious for an infiltrative mass in this location. Focal inflammatory changes as can be seen with inflammatory bowel disease could appear similarly. There is infiltration within the adjacent sigmoid mesentery best seen on axial image # 69/3 suspicious for transmural infiltration. This infiltrative change appears to abut several loops of small bowel within the deep pelvis, best seen on sagittal image # 91/7. Mild proximal sigmoid diverticulosis. No evidence of obstruction. Stomach, small bowel, and large bowel are otherwise unremarkable. Appendix normal. Vascular/Lymphatic: There is pathologic left periaortic lymphadenopathy with an index lymph node measuring 2.1 x 2.5 cm at axial image # 36/3. 6 mm enhancing nodule within the sigmoid mesocolon and  axial image # 72/3 likely represents a pathologic mesenteric lymph node or small omental implant. Moderate aortoiliac atherosclerotic calcification. Bilateral common iliac artery stenting has been performed. No aortic aneurysm.  Reproductive: Status post hysterectomy. No adnexal masses. Other: Small fat containing left inguinal hernia. Musculoskeletal: No acute bone abnormality. No lytic or blastic bone lesion. IMPRESSION: 1. Circumferential wall thickening involving the mid sigmoid colon with associated hyperemia suspicious for an infiltrative mass in this location. Associated infiltration within the sigmoid mesocolon suggests transmural infiltration. Associated pathologic adenopathy within the sigmoid mesentery and left periaortic lymph node group. No evidence of obstruction. Correlation with endoscopy is recommended for further evaluation. Aortic Atherosclerosis (ICD10-I70.0). Electronically Signed   By: Fidela Salisbury M.D.   On: 04/16/2022 20:44      Kayia Billinger L. Tarri Glenn, MD, MPH 04/17/2022, 9:01 AM

## 2022-04-17 NOTE — ED Notes (Signed)
Provided pt breakfast tray

## 2022-04-17 NOTE — Progress Notes (Addendum)
PROGRESS NOTE    Monica Pugh  [id number] DOB: [birth year] DOA: 04/16/2022 PCP: Bonnita Nasuti, MD   Brief Narrative: 72 year old with past medical history significant for endometrial cancer, carotid artery occlusion, stroke, hyperlipidemia, anemia, sleep apnea not on CPAP, PVD, GERD, bilateral hearing loss, tobacco use presents complaining of lower abdominal pain, nausea vomiting.  She was found to have leukocytosis white blood cell 14.  CT abdomen and pelvis show circumferential wall thickening involving the mid sigmoid colon with associated hyperemia suspicious for an infiltrative mass in this location.  Associated infiltration within the sigmoid mesocolon suggest transmural infiltration.  Adenopathy within the sigmoid mesentery and left periaortic lymph node groups.  Negative for obstruction.  Patient developed hypoxia in the ED after she received 4 mg of IV morphine. She reports ongoing bilateral lower extremity abdominal pain for few months.  She developed nausea and vomiting for the last 3 days. Admitted for further evaluation of colonic mass   Assessment & Plan:   Principal Problem:   Colonic mass Active Problems:   GERD (gastroesophageal reflux disease)   Hyperlipidemia   PVD (peripheral vascular disease) (HCC)   Nausea and vomiting   Hypokalemia   History of stroke   Hypoxemia  1-Mid sigmoid colon: Nausea vomiting abdominal pain: -CT showed circumferential wall thickening involving the mid sigmoid colon with associated hyperemia suspicious for an infiltrative mass.  Positive for adenopathy. -Patient had mild leukocytosis, she was a started on IV antibiotics. -Has been consulted -Continue with IV fluids -As needed Zofran -GI  consulted, plan to proceed with colonoscopy tomorrow.  Hypokalemia: Replete orally  Hypomagnesemia: Replete IV  Acute hypoxemia: In the setting of opioid Wean off of oxygen as tolerated. Will reduce morphine dose to 0.5 mg every 4 hours as  needed.  Monitor for any fever or hypoxemia Chest x-ray:Low lung volumes with mild bibasilar atelectasis.    History of stroke, PVD, hyperlipidemia; Continue with statin.  Hold aspirin and Plavix in anticipation of biopsy tomorrow  GERD;  PPI  Iron deficiency anemia: Monitor hemoglobin She will need iron supplement  HTN; PRN hydralazine  Estimated body mass index is 31.24 kg/m as calculated from the following:   Height as of this encounter: '5\' 4"'$  (1.626 m).   Weight as of this encounter: 82.6 kg.   DVT prophylaxis: SCD Code Status: DNR Family Communication: Care discussed with patient.  Disposition Plan:  Status is: Observation The patient remains OBS appropriate and will d/c before 2 midnights.    Consultants:  GI  Procedures:  None  Antimicrobials:    Subjective: She report lower quadrant abdominal pain for several months.  She reported nausea vomiting started 3 days prior to admission..  She reported intermittent episode of bloody stool  Objective: Vitals:   04/17/22 0222 04/17/22 0300 04/17/22 0600 04/17/22 0630  BP:  (!) 160/66 (!) 154/64 (!) 167/67  Pulse: 75 68 72 71  Resp: 20 20 (!) 23 (!) 22  Temp: 98.2 F (36.8 C)     TempSrc: Oral     SpO2: 98% 97% 95% 97%  Weight:      Height:        Intake/Output Summary (Last 24 hours) at 04/17/2022 0732 Last data filed at 04/17/2022 0657 Gross per 24 hour  Intake 1741.08 ml  Output --  Net 1741.08 ml   Filed Weights   04/16/22 1728  Weight: 82.6 kg    Examination:  General exam: Appears calm and comfortable  Respiratory system: Clear to auscultation. Respiratory  effort normal. Cardiovascular system: S1 & S2 heard, RRR. No JVD, murmurs, rubs, gallops or clicks. No pedal edema. Gastrointestinal system: Abdomen is nondistended, soft and nontender. No organomegaly or masses felt. Normal bowel sounds heard. Central nervous system: Alert and oriented.  Extremities: Symmetric 5 x 5 power.   Data  Reviewed: I have personally reviewed following labs and imaging studies  CBC: Recent Labs  Lab 04/16/22 1749 04/17/22 0158  WBC 14.7* 13.1*  NEUTROABS 11.1*  --   HGB 11.7* 10.6*  HCT 38.2 33.3*  MCV 76.4* 76.2*  PLT 419* 0000000   Basic Metabolic Panel: Recent Labs  Lab 04/16/22 1749 04/17/22 0158  NA 135 134*  K 2.6* 3.1*  CL 91* 96*  CO2 28 28  GLUCOSE 106* 112*  BUN 12 8  CREATININE 0.87 0.79  CALCIUM 9.4 8.4*  MG  --  1.7   GFR: Estimated Creatinine Clearance: 68.1 mL/min (by C-G formula based on SCr of 0.79 mg/dL). Liver Function Tests: Recent Labs  Lab 04/16/22 1749  AST 14*  ALT 8  ALKPHOS 111  BILITOT 0.6  PROT 6.5  ALBUMIN 3.3*   Recent Labs  Lab 04/16/22 1749  LIPASE 21   No results for input(s): "AMMONIA" in the last 168 hours. Coagulation Profile: No results for input(s): "INR", "PROTIME" in the last 168 hours. Cardiac Enzymes: No results for input(s): "CKTOTAL", "CKMB", "CKMBINDEX", "TROPONINI" in the last 168 hours. BNP (last 3 results) No results for input(s): "PROBNP" in the last 8760 hours. HbA1C: No results for input(s): "HGBA1C" in the last 72 hours. CBG: No results for input(s): "GLUCAP" in the last 168 hours. Lipid Profile: No results for input(s): "CHOL", "HDL", "LDLCALC", "TRIG", "CHOLHDL", "LDLDIRECT" in the last 72 hours. Thyroid Function Tests: No results for input(s): "TSH", "T4TOTAL", "FREET4", "T3FREE", "THYROIDAB" in the last 72 hours. Anemia Panel: No results for input(s): "VITAMINB12", "FOLATE", "FERRITIN", "TIBC", "IRON", "RETICCTPCT" in the last 72 hours. Sepsis Labs: No results for input(s): "PROCALCITON", "LATICACIDVEN" in the last 168 hours.  Recent Results (from the past 240 hour(s))  Resp panel by RT-PCR (RSV, Flu A&B, Covid) Anterior Nasal Swab     Status: None   Collection Time: 04/16/22  5:52 PM   Specimen: Anterior Nasal Swab  Result Value Ref Range Status   SARS Coronavirus 2 by RT PCR NEGATIVE NEGATIVE  Final   Influenza A by PCR NEGATIVE NEGATIVE Final   Influenza B by PCR NEGATIVE NEGATIVE Final    Comment: (NOTE) The Xpert Xpress SARS-CoV-2/FLU/RSV plus assay is intended as an aid in the diagnosis of influenza from Nasopharyngeal swab specimens and should not be used as a sole basis for treatment. Nasal washings and aspirates are unacceptable for Xpert Xpress SARS-CoV-2/FLU/RSV testing.  Fact Sheet for Patients: EntrepreneurPulse.com.au  Fact Sheet for Healthcare Providers: IncredibleEmployment.be  This test is not yet approved or cleared by the [place] FDA and has been authorized for detection and/or diagnosis of SARS-CoV-2 by FDA under an Emergency Use Authorization (EUA). This EUA will remain in effect (meaning this test can be used) for the duration of the COVID-19 declaration under Section 564(b)(1) of the Act, 21 U.S.C. section 360bbb-3(b)(1), unless the authorization is terminated or revoked.     Resp Syncytial Virus by PCR NEGATIVE NEGATIVE Final    Comment: (NOTE) Fact Sheet for Patients: EntrepreneurPulse.com.au  Fact Sheet for Healthcare Providers: IncredibleEmployment.be  This test is not yet approved or cleared by the [place] FDA and has been authorized for detection and/or diagnosis  of SARS-CoV-2 by FDA under an Emergency Use Authorization (EUA). This EUA will remain in effect (meaning this test can be used) for the duration of the COVID-19 declaration under Section 564(b)(1) of the Act, 21 U.S.C. section 360bbb-3(b)(1), unless the authorization is terminated or revoked.  Performed at [facility], [place] [address], [place], [place] [address]          Radiology Studies: DG CHEST PORT 1 VIEW  Result Date: 04/17/2022 CLINICAL DATA:  Lower abdominal pain with nausea and vomiting. EXAM: PORTABLE CHEST 1 VIEW COMPARISON:  May 15, 2015 FINDINGS: The heart size and  mediastinal contours are within normal limits. There is moderate severity calcification of the aortic arch. Low lung volumes are noted with mild atelectatic changes seen within the bilateral lung bases. There is no evidence of a pleural effusion or pneumothorax. A radiopaque stent is seen overlying the soft tissues of left neck. The visualized skeletal structures are unremarkable. IMPRESSION: Low lung volumes with mild bibasilar atelectasis. Electronically Signed   By: Virgina Norfolk M.D.   On: 04/17/2022 00:29   CT Abdomen Pelvis W Contrast  Result Date: 04/16/2022 CLINICAL DATA:  Lower abdominal pain EXAM: CT ABDOMEN AND PELVIS WITH CONTRAST TECHNIQUE: Multidetector CT imaging of the abdomen and pelvis was performed using the standard protocol following bolus administration of intravenous contrast. RADIATION DOSE REDUCTION: This exam was performed according to the departmental dose-optimization program which includes automated exposure control, adjustment of the mA and/or kV according to patient size and/or use of iterative reconstruction technique. CONTRAST:  40m OMNIPAQUE IOHEXOL 350 MG/ML SOLN COMPARISON:  04/05/2021 FINDINGS: Lower chest: No acute abnormality.  Small hiatal hernia. Hepatobiliary: Stable hypodensities within segment 4B and segment 5 likely representing small hepatic cysts. Liver otherwise unremarkable. No intra or extrahepatic biliary ductal dilation. Gallbladder unremarkable. Pancreas: Unremarkable Spleen: Unremarkable Adrenals/Urinary Tract: The adrenal glands are unremarkable. The kidneys are normal. The bladder is unremarkable. Stomach/Bowel: There is circumferential wall thickening involving the mid sigmoid colon with associated hyperemia suspicious for an infiltrative mass in this location. Focal inflammatory changes as can be seen with inflammatory bowel disease could appear similarly. There is infiltration within the adjacent sigmoid mesentery best seen on axial image # 69/3  suspicious for transmural infiltration. This infiltrative change appears to abut several loops of small bowel within the deep pelvis, best seen on sagittal image # 91/7. Mild proximal sigmoid diverticulosis. No evidence of obstruction. Stomach, small bowel, and large bowel are otherwise unremarkable. Appendix normal. Vascular/Lymphatic: There is pathologic left periaortic lymphadenopathy with an index lymph node measuring 2.1 x 2.5 cm at axial image # 36/3. 6 mm enhancing nodule within the sigmoid mesocolon and axial image # 72/3 likely represents a pathologic mesenteric lymph node or small omental implant. Moderate aortoiliac atherosclerotic calcification. Bilateral common iliac artery stenting has been performed. No aortic aneurysm. Reproductive: Status post hysterectomy. No adnexal masses. Other: Small fat containing left inguinal hernia. Musculoskeletal: No acute bone abnormality. No lytic or blastic bone lesion. IMPRESSION: 1. Circumferential wall thickening involving the mid sigmoid colon with associated hyperemia suspicious for an infiltrative mass in this location. Associated infiltration within the sigmoid mesocolon suggests transmural infiltration. Associated pathologic adenopathy within the sigmoid mesentery and left periaortic lymph node group. No evidence of obstruction. Correlation with endoscopy is recommended for further evaluation. Aortic Atherosclerosis (ICD10-I70.0). Electronically Signed   By: AFidela SalisburyM.D.   On: 04/16/2022 20:44        Scheduled Meds: Continuous Infusions:  sodium chloride  125 mL/hr at 04/17/22 0011   piperacillin-tazobactam (ZOSYN)  IV 3.375 g (04/17/22 0716)     LOS: 0 days    Time spent: 35 minutes    Nikolaus Pienta A Bannie Lobban, MD [facility]   If 7PM-7AM, please contact night-coverage [email or web address]  04/17/2022, 7:32 AM

## 2022-04-17 NOTE — ED Notes (Signed)
ED TO INPATIENT HANDOFF REPORT  ED Nurse Name and Phone #: Codie Krogh [phone]  S Name/Age/Gender Monica Pugh 72 y.o. female Room/Bed: 007C/007C  Code Status   Code Status: DNR  Home/SNF/Other Home Patient oriented to: self, place, time, and situation Is this baseline? Yes   Triage Complete: Triage complete  Chief Complaint Colonic mass [K63.89]  Triage Note Pt c/o n/v for the past week with inability to keep food/fluids down. Pt also c/o abdominal pain since hysterectomy in may 2023.   Allergies Allergies  Allergen Reactions   Cefadroxil Other (See Comments)    unknown    Level of Care/Admitting Diagnosis ED Disposition     ED Disposition  Admit   Condition  --   Comment  Hospital Area: [place] [[id number]]  Level of Care: Telemetry Medical [104]  May place patient in observation at [facility] or [place] if equivalent level of care is available:: Yes  Covid Evaluation: Asymptomatic - no recent exposure (last 10 days) testing not required  Diagnosis: Colonic mass [706182]  Admitting Physician: Shela Leff [id number]  Attending Physician: Shela Leff [id number]          B Medical/Surgery History Past Medical History:  Diagnosis Date   Anemia    Carotid artery occlusion    GERD (gastroesophageal reflux disease)    Hearing loss    bilateral - no hearing aids   Hyperlipidemia    Intractable nausea and vomiting 04/16/2022   Peripheral vascular disease (HCC)    Sleep apnea    does not use cpap   Stroke ([place])    Vitamin B 12 deficiency    Vitamin D deficiency    Past Surgical History:  Procedure Laterality Date   ABDOMINAL HYSTERECTOMY     COLONOSCOPY     IR ANGIO INTRA EXTRACRAN SEL COM CAROTID INNOMINATE UNI R MOD SED  05/11/2020   IR ANGIO INTRA EXTRACRAN SEL INTERNAL CAROTID UNI L MOD SED  05/11/2020   IR ANGIO INTRA EXTRACRAN SEL INTERNAL CAROTID UNI L MOD SED  06/08/2020   IR ANGIO INTRA EXTRACRAN SEL INTERNAL  CAROTID UNI L MOD SED  06/26/2020   IR ANGIO INTRA EXTRACRAN SEL INTERNAL CAROTID UNI L MOD SED  06/09/2021   IR ANGIO VERTEBRAL SEL SUBCLAVIAN INNOMINATE BILAT MOD SED  05/11/2020   IR ANGIOGRAM EXTREMITY BILATERAL  06/08/2020   IR CT HEAD LTD  06/26/2020   IR ILIAC ART STENT INC PTA MOD SED  06/26/2020   IR ILIAC ART STENT INC PTA MOD SED  06/26/2020   IR INTRAVSC STENT CERV CAROTID W/EMB-PROT MOD SED INCL ANGIO  06/26/2020   IR IVUS EACH ADDITIONAL NON CORONARY VESSEL  06/26/2020   IR RADIOLOGIST EVAL & MGMT  05/28/2020   IR RADIOLOGIST EVAL & MGMT  07/15/2020   IR RADIOLOGIST EVAL & MGMT  07/27/2020   IR US GUIDE VASC ACCESS LEFT  06/26/2020   IR US GUIDE VASC ACCESS RIGHT  05/11/2020   IR US GUIDE VASC ACCESS RIGHT  06/08/2020   IR US GUIDE VASC ACCESS RIGHT  06/26/2020   IR US GUIDE VASC ACCESS RIGHT  06/09/2021   RADIOLOGY WITH ANESTHESIA N/A 05/09/2015   Procedure: RADIOLOGY WITH ANESTHESIA;  Surgeon: Luanne Bras, MD;  Location: [place];  Service: Radiology;  Laterality: N/A;   RADIOLOGY WITH ANESTHESIA N/A 06/08/2020   Procedure: IR WITH ANESTHESIA ANGIOPLASTY;  Surgeon: Luanne Bras, MD;  Location: [place];  Service: Radiology;  Laterality: N/A;   RADIOLOGY WITH  ANESTHESIA N/A 06/26/2020   Procedure: REVASCULATION;  Surgeon: Luanne Bras, MD;  Location: [place];  Service: Radiology;  Laterality: N/A;   TUBAL LIGATION       A IV Location/Drains/Wounds Patient Lines/Drains/Airways Status     Active Line/Drains/Airways     Name Placement date Placement time Site Days   Peripheral IV 04/16/22 20 G Left Hand 04/16/22  1800  Hand  1   Wound / Incision (Open or Dehisced) 05/11/20 Puncture Wrist Right arterial access puncture site 05/11/20  1001  Wrist  706            Intake/Output Last 24 hours  Intake/Output Summary (Last 24 hours) at 04/17/2022 1441 Last data filed at 04/17/2022 [id number] Gross per 24 hour  Intake 1741.08 ml  Output --  Net 1741.08 ml     Labs/Imaging Results for orders placed or performed during the hospital encounter of 04/16/22 (from the past 48 hour(s))  CBC with Differential     Status: Abnormal   Collection Time: 04/16/22  5:49 PM  Result Value Ref Range   WBC 14.7 (H) 4.0 - 10.5 K/uL   RBC 5.00 3.87 - 5.11 MIL/uL   Hemoglobin 11.7 (L) 12.0 - 15.0 g/dL   HCT 38.2 36.0 - 46.0 %   MCV 76.4 (L) 80.0 - 100.0 fL   MCH 23.4 (L) 26.0 - 34.0 pg   MCHC 30.6 30.0 - 36.0 g/dL   RDW 20.0 (H) 11.5 - 15.5 %   Platelets 419 (H) 150 - 400 K/uL   nRBC 0.0 0.0 - 0.2 %   Neutrophils Relative % 76 %   Neutro Abs 11.1 (H) 1.7 - 7.7 K/uL   Lymphocytes Relative 17 %   Lymphs Abs 2.5 0.7 - 4.0 K/uL   Monocytes Relative 7 %   Monocytes Absolute 1.0 0.1 - 1.0 K/uL   Eosinophils Relative 0 %   Eosinophils Absolute 0.1 0.0 - 0.5 K/uL   Basophils Relative 0 %   Basophils Absolute 0.1 0.0 - 0.1 K/uL   Immature Granulocytes 0 %   Abs Immature Granulocytes 0.04 0.00 - 0.07 K/uL    Comment: Performed at [facility], [address], [place], [place] [address]  Comprehensive metabolic panel     Status: Abnormal   Collection Time: 04/16/22  5:49 PM  Result Value Ref Range   Sodium 135 135 - 145 mmol/L   Potassium 2.6 (LL) 3.5 - 5.1 mmol/L    Comment: CRITICAL RESULT CALLED TO, READ BACK BY AND VERIFIED WITH T,SHEARIN RN '@1930'$  04/16/22 E,[place]   Chloride 91 (L) 98 - 111 mmol/L   CO2 28 22 - 32 mmol/L   Glucose, Bld 106 (H) 70 - 99 mg/dL    Comment: Glucose reference range applies only to samples taken after fasting for at least 8 hours.   BUN 12 8 - 23 mg/dL   Creatinine, Ser 0.87 0.44 - 1.00 mg/dL   Calcium 9.4 8.9 - 10.3 mg/dL   Total Protein 6.5 6.5 - 8.1 g/dL   Albumin 3.3 (L) 3.5 - 5.0 g/dL   AST 14 (L) 15 - 41 U/L   ALT 8 0 - 44 U/L   Alkaline Phosphatase 111 38 - 126 U/L   Total Bilirubin 0.6 0.3 - 1.2 mg/dL   GFR, Estimated >60 >60 mL/min    Comment: (NOTE) Calculated using the CKD-EPI Creatinine Equation  (2021)    Anion gap 16 (H) 5 - 15    Comment: Performed at [facility], [place] [address], [place], [place] [address]  Lipase, blood     Status: None   Collection Time: 04/16/22  5:49 PM  Result Value Ref Range   Lipase 21 11 - 51 U/L    Comment: Performed at [facility] [address] [place], [place] [address]  Urinalysis, Routine w reflex microscopic -Urine, Catheterized     Status: Abnormal   Collection Time: 04/16/22  5:51 PM  Result Value Ref Range   Color, Urine AMBER (A) YELLOW    Comment: BIOCHEMICALS MAY BE AFFECTED BY COLOR   APPearance HAZY (A) CLEAR   Specific Gravity, Urine 1.035 (H) 1.005 - 1.030   pH 5.0 5.0 - 8.0   Glucose, UA NEGATIVE NEGATIVE mg/dL   Hgb urine dipstick NEGATIVE NEGATIVE   Bilirubin Urine SMALL (A) NEGATIVE   Ketones, ur NEGATIVE NEGATIVE mg/dL   Protein, ur 30 (A) NEGATIVE mg/dL   Nitrite NEGATIVE NEGATIVE   Leukocytes,Ua NEGATIVE NEGATIVE   RBC / HPF 0-5 0 - 5 RBC/hpf   WBC, UA 0-5 0 - 5 WBC/hpf   Bacteria, UA RARE (A) NONE SEEN   Squamous Epithelial / HPF 6-10 0 - 5 /HPF   Mucus PRESENT    Hyaline Casts, UA PRESENT     Comment: Performed at [facility], [address], [place], [place] [address]  Resp panel by RT-PCR (RSV, Flu A&B, Covid) Anterior Nasal Swab     Status: None   Collection Time: 04/16/22  5:52 PM   Specimen: Anterior Nasal Swab  Result Value Ref Range   SARS Coronavirus 2 by RT PCR NEGATIVE NEGATIVE   Influenza A by PCR NEGATIVE NEGATIVE   Influenza B by PCR NEGATIVE NEGATIVE    Comment: (NOTE) The Xpert Xpress SARS-CoV-2/FLU/RSV plus assay is intended as an aid in the diagnosis of influenza from Nasopharyngeal swab specimens and should not be used as a sole basis for treatment. Nasal washings and aspirates are unacceptable for Xpert Xpress SARS-CoV-2/FLU/RSV testing.  Fact Sheet for Patients: EntrepreneurPulse.com.au  Fact Sheet for Healthcare  Providers: IncredibleEmployment.be  This test is not yet approved or cleared by the [place] FDA and has been authorized for detection and/or diagnosis of SARS-CoV-2 by FDA under an Emergency Use Authorization (EUA). This EUA will remain in effect (meaning this test can be used) for the duration of the COVID-19 declaration under Section 564(b)(1) of the Act, 21 U.S.C. section 360bbb-3(b)(1), unless the authorization is terminated or revoked.     Resp Syncytial Virus by PCR NEGATIVE NEGATIVE    Comment: (NOTE) Fact Sheet for Patients: EntrepreneurPulse.com.au  Fact Sheet for Healthcare Providers: IncredibleEmployment.be  This test is not yet approved or cleared by the [place] FDA and has been authorized for detection and/or diagnosis of SARS-CoV-2 by FDA under an Emergency Use Authorization (EUA). This EUA will remain in effect (meaning this test can be used) for the duration of the COVID-19 declaration under Section 564(b)(1) of the Act, 21 U.S.C. section 360bbb-3(b)(1), unless the authorization is terminated or revoked.  Performed at [facility], [place] [address] [place], [place] [address]   HIV Antibody (routine testing w rflx)     Status: None   Collection Time: 04/17/22  1:58 AM  Result Value Ref Range   HIV Screen 4th Generation wRfx Non Reactive Non Reactive    Comment: Performed at [facility], [place] [address], [place], [place] [address]  CBC  Status: Abnormal   Collection Time: 04/17/22  1:58 AM  Result Value Ref Range   WBC 13.1 (H) 4.0 - 10.5 K/uL   RBC 4.37 3.87 - 5.11 MIL/uL   Hemoglobin 10.6 (L) 12.0 - 15.0 g/dL   HCT 33.3 (L) 36.0 - 46.0 %   MCV 76.2 (L) 80.0 - 100.0 fL   MCH 24.3 (L) 26.0 - 34.0 pg   MCHC 31.8 30.0 - 36.0 g/dL   RDW 19.6 (H) 11.5 - 15.5 %   Platelets 353 150 - 400 K/uL   nRBC 0.0 0.0 - 0.2 %    Comment: Performed at [facility] [address],  [place], [place] [id number]  Basic metabolic panel     Status: Abnormal   Collection Time: 04/17/22  1:58 AM  Result Value Ref Range   Sodium 134 (L) 135 - 145 mmol/L   Potassium 3.1 (L) 3.5 - 5.1 mmol/L   Chloride 96 (L) 98 - 111 mmol/L   CO2 28 22 - 32 mmol/L   Glucose, Bld 112 (H) 70 - 99 mg/dL    Comment: Glucose reference range applies only to samples taken after fasting for at least 8 hours.   BUN 8 8 - 23 mg/dL   Creatinine, Ser 0.79 0.44 - 1.00 mg/dL   Calcium 8.4 (L) 8.9 - 10.3 mg/dL   GFR, Estimated >60 >60 mL/min    Comment: (NOTE) Calculated using the CKD-EPI Creatinine Equation (2021)    Anion gap 10 5 - 15    Comment: Performed at [facility] [address], [place], [place] [address]  Magnesium     Status: None   Collection Time: 04/17/22  1:58 AM  Result Value Ref Range   Magnesium 1.7 1.7 - 2.4 mg/dL    Comment: Performed at [facility] [address] [place], [place] [address]  Vitamin B12     Status: Abnormal   Collection Time: 04/17/22 10:06 AM  Result Value Ref Range   Vitamin B-12 1,119 (H) 180 - 914 pg/mL    Comment: (NOTE) This assay is not validated for testing neonatal or myeloproliferative syndrome specimens for Vitamin B12 levels. Performed at [facility], [place] [address], [place], [place] [address]   Folate     Status: None   Collection Time: 04/17/22 10:06 AM  Result Value Ref Range   Folate 9.3 >5.9 ng/mL    Comment: Performed at [facility] [address] [place], [place] [address]  Iron and TIBC     Status: Abnormal   Collection Time: 04/17/22 10:06 AM  Result Value Ref Range   Iron 26 (L) 28 - 170 ug/dL   TIBC 385 250 - 450 ug/dL   Saturation Ratios 7 (L) 10.4 - 31.8 %   UIBC 359 ug/dL    Comment: Performed at [facility], [place] [address] [place], [place] [address]  Ferritin     Status: None   Collection Time: 04/17/22 10:06 AM  Result Value Ref Range   Ferritin 14 11 - 307 ng/mL    Comment: Performed  at [facility], [place] [address], [place], [place] [address]  Reticulocytes     Status: None   Collection Time: 04/17/22 10:06 AM  Result Value Ref Range   Retic Ct Pct 1.2 0.4 - 3.1 %   RBC. 4.29 3.87 - 5.11 MIL/uL   Retic Count, Absolute 51.1 19.0 - 186.0 K/uL   Immature  Retic Fract 13.4 2.3 - 15.9 %    Comment: Performed at [facility], [place] [address] [place], [place] [address]   DG CHEST PORT 1 VIEW  Result Date: 04/17/2022 CLINICAL DATA:  Lower abdominal pain with nausea and vomiting. EXAM: PORTABLE CHEST 1 VIEW COMPARISON:  May 15, 2015 FINDINGS: The heart size and mediastinal contours are within normal limits. There is moderate severity calcification of the aortic arch. Low lung volumes are noted with mild atelectatic changes seen within the bilateral lung bases. There is no evidence of a pleural effusion or pneumothorax. A radiopaque stent is seen overlying the soft tissues of left neck. The visualized skeletal structures are unremarkable. IMPRESSION: Low lung volumes with mild bibasilar atelectasis. Electronically Signed   By: Virgina Norfolk M.D.   On: 04/17/2022 00:29   CT Abdomen Pelvis W Contrast  Result Date: 04/16/2022 CLINICAL DATA:  Lower abdominal pain EXAM: CT ABDOMEN AND PELVIS WITH CONTRAST TECHNIQUE: Multidetector CT imaging of the abdomen and pelvis was performed using the standard protocol following bolus administration of intravenous contrast. RADIATION DOSE REDUCTION: This exam was performed according to the departmental dose-optimization program which includes automated exposure control, adjustment of the mA and/or kV according to patient size and/or use of iterative reconstruction technique. CONTRAST:  71m OMNIPAQUE IOHEXOL 350 MG/ML SOLN COMPARISON:  04/05/2021 FINDINGS: Lower chest: No acute abnormality.  Small hiatal hernia. Hepatobiliary: Stable hypodensities within segment 4B and segment 5 likely representing small hepatic cysts. Liver otherwise  unremarkable. No intra or extrahepatic biliary ductal dilation. Gallbladder unremarkable. Pancreas: Unremarkable Spleen: Unremarkable Adrenals/Urinary Tract: The adrenal glands are unremarkable. The kidneys are normal. The bladder is unremarkable. Stomach/Bowel: There is circumferential wall thickening involving the mid sigmoid colon with associated hyperemia suspicious for an infiltrative mass in this location. Focal inflammatory changes as can be seen with inflammatory bowel disease could appear similarly. There is infiltration within the adjacent sigmoid mesentery best seen on axial image # 69/3 suspicious for transmural infiltration. This infiltrative change appears to abut several loops of small bowel within the deep pelvis, best seen on sagittal image # 91/7. Mild proximal sigmoid diverticulosis. No evidence of obstruction. Stomach, small bowel, and large bowel are otherwise unremarkable. Appendix normal. Vascular/Lymphatic: There is pathologic left periaortic lymphadenopathy with an index lymph node measuring 2.1 x 2.5 cm at axial image # 36/3. 6 mm enhancing nodule within the sigmoid mesocolon and axial image # 72/3 likely represents a pathologic mesenteric lymph node or small omental implant. Moderate aortoiliac atherosclerotic calcification. Bilateral common iliac artery stenting has been performed. No aortic aneurysm. Reproductive: Status post hysterectomy. No adnexal masses. Other: Small fat containing left inguinal hernia. Musculoskeletal: No acute bone abnormality. No lytic or blastic bone lesion. IMPRESSION: 1. Circumferential wall thickening involving the mid sigmoid colon with associated hyperemia suspicious for an infiltrative mass in this location. Associated infiltration within the sigmoid mesocolon suggests transmural infiltration. Associated pathologic adenopathy within the sigmoid mesentery and left periaortic lymph node group. No evidence of obstruction. Correlation with endoscopy is  recommended for further evaluation. Aortic Atherosclerosis (ICD10-I70.0). Electronically Signed   By: AFidela SalisburyM.D.   On: 04/16/2022 20:44    Pending Labs Unresulted Labs (From admission, onward)    None       Vitals/Pain Today's Vitals   04/17/22 1200 04/17/22 1212 04/17/22 1400 04/17/22 1410  BP: (!) 148/75  (!) 151/67   Pulse: 62  66   Resp: 20  (!) 24   Temp:  97.9 F (36.6 C)  TempSrc:    Oral  SpO2: 99%  100%   Weight:      Height:      PainSc:  0-No pain      Isolation Precautions No active isolations  Medications Medications  piperacillin-tazobactam (ZOSYN) IVPB 3.375 g (3.375 g Intravenous New Bag/Given 04/17/22 1338)  acetaminophen (TYLENOL) tablet 650 mg (has no administration in time range)    Or  acetaminophen (TYLENOL) suppository 650 mg (has no administration in time range)  potassium chloride 10 mEq in 100 mL IVPB (0 mEq Intravenous Stopped 04/17/22 0657)  naloxone (NARCAN) injection 0.4 mg (has no administration in time range)  ondansetron (ZOFRAN) injection 4 mg (4 mg Intravenous Given 04/17/22 0953)  0.9 %  sodium chloride infusion ( Intravenous New Bag/Given 04/17/22 1145)  ipratropium-albuterol (DUONEB) 0.5-2.5 (3) MG/3ML nebulizer solution 3 mL (3 mLs Nebulization Not Given 04/17/22 1207)  guaiFENesin (MUCINEX) 12 hr tablet 600 mg (600 mg Oral Given 04/17/22 1002)  pantoprazole (PROTONIX) injection 40 mg (40 mg Intravenous Given 04/17/22 0957)  morphine (PF) 2 MG/ML injection 0.5 mg (0.5 mg Intravenous Given 04/17/22 0953)  bisacodyl (DULCOLAX) EC tablet 20 mg (has no administration in time range)  ondansetron (ZOFRAN-ODT) disintegrating tablet 4 mg (4 mg Oral Given 04/16/22 1759)  lactated ringers bolus 1,000 mL (0 mLs Intravenous Stopped 04/16/22 2023)  potassium chloride 10 mEq in 100 mL IVPB (0 mEq Intravenous Stopped 04/16/22 2339)  morphine (PF) 4 MG/ML injection 4 mg (4 mg Intravenous Given 04/16/22 2017)  iohexol (OMNIPAQUE) 350 MG/ML injection  75 mL (75 mLs Intravenous Contrast Given 04/16/22 2013)  potassium chloride SA (KLOR-CON M) CR tablet 40 mEq (40 mEq Oral Given 04/17/22 1338)  magnesium sulfate IVPB 2 g 50 mL (0 g Intravenous Stopped 04/17/22 1145)    Mobilitywalks with device      Focused Assessments Gi/gu   R Recommendations: See Admitting Provider Note  Report given to:   Additional Notes: .

## 2022-04-17 NOTE — H&P (View-Only) (Signed)
Referring Provider: No ref. provider found Primary Care Physician:  Bonnita Nasuti, MD Primary Gastroenterologist: Dr. Carmell Austria  Reason for Consultation: Colon mass on CT   IMPRESSION:  26-monthhistory of change in bowel habits now with acute nausea, vomiting, and abdominal pain.  Abdominal CT scan raising concerns for a colon mass with transmural infiltration. Colonoscopy recommended.   Long-term use of Plavix.  Plavix has been on hold since Wednesday.  Microcytic anemia.  Likely related to colon mass. Labs to further clarify the extent of suspected iron deficiency.  Thrombocytosis. Likely due to suspected iron deficiency.   History of benign colon polyps. Per patient report.   PLAN: Clear liquid diet today Anemia studies ordered Nothing to eat after midnight Continue to hold Plavix Colonoscopy with Dr. DLorenso Courier2/26/2024  I consented the patient discussing the risks, benefits, and alternatives to endoscopic evaluation. In particular, we discussed the risks that include, but are not limited to, reaction to medication, cardiopulmonary compromise, bleeding requiring blood transfusion, aspiration resulting in pneumonia, perforation requiring surgery, lack of diagnosis, severe illness requiring hospitalization, and even death.  In particular, she is at increased risk for perforation given the CT findings.  We reviewed the risk of missed lesion including polyps or even cancer. The patient acknowledges these risks and asks that we proceed.   HPI: Monica Holgersonis a 72y.o. female seen in consultation's request of the hospitalist for further evaluation of an abnormal CT.  The history is obtained through the patient and review of her electronic health record.  She has a history of grade 2 endometrial cancer, carotid artery occlusion, stroke, hyperlipidemia, anemia, sleep apnea not on CPAP, peripheral vascular disease, reflux, bilateral hearing loss, and tobacco abuse.  She has had a total  hysterectomy and bilateral salpingo-oophorectomy.  She is on Plavix and her last dose was 04/13/2022.  For the last 6-12 months she has had bilateral lower abdominal pain with a change in bowel habits.  Her bowel movements have become more loose and mucousy.  There is been no bleeding.  She was admitted through the emergency room with abdominal pain, nausea, and vomiting.  Labs showed a hemoglobin of 11.7, MCV 76.4, RDW 20, platelets 419 and platelet count of 419.  Liver enzymes and lipase were normal.  CT of the abdomen and pelvis with contrast showed circumferential wall thickening involving the mid sigmoid colon concerning for an infiltrative mass.  There appears to be associated pathologic adenopathy within the sigmoid mesentery and a left periaortic lymph node group.  No evidence for obstruction.  [place] 10 [place]  She reports benign polyps removed at that time.  She has been thinking is about time for another colonoscopy but her primary care doctor told her it is too early.  There is no known family history of colon cancer or polyps. No family history of stomach cancer or other GI malignancy. No family history of inflammatory bowel disease or celiac.    Past Medical History:  Diagnosis Date   Anemia    Carotid artery occlusion    GERD (gastroesophageal reflux disease)    Hearing loss    bilateral - no hearing aids   Hyperlipidemia    Intractable nausea and vomiting 04/16/2022   Peripheral vascular disease (HCC)    Sleep apnea    does not use cpap   Stroke ([facility]    Vitamin B 12 deficiency    Vitamin D deficiency  Past Surgical History:  Procedure Laterality Date   ABDOMINAL HYSTERECTOMY     COLONOSCOPY     IR ANGIO INTRA EXTRACRAN SEL COM CAROTID INNOMINATE UNI R MOD SED  05/11/2020   IR ANGIO INTRA EXTRACRAN SEL INTERNAL CAROTID UNI L MOD SED  05/11/2020   IR ANGIO INTRA EXTRACRAN SEL INTERNAL CAROTID UNI L MOD SED  06/08/2020    IR ANGIO INTRA EXTRACRAN SEL INTERNAL CAROTID UNI L MOD SED  06/26/2020   IR ANGIO INTRA EXTRACRAN SEL INTERNAL CAROTID UNI L MOD SED  06/09/2021   IR ANGIO VERTEBRAL SEL SUBCLAVIAN INNOMINATE BILAT MOD SED  05/11/2020   IR ANGIOGRAM EXTREMITY BILATERAL  06/08/2020   IR CT HEAD LTD  06/26/2020   IR ILIAC ART STENT INC PTA MOD SED  06/26/2020   IR ILIAC ART STENT INC PTA MOD SED  06/26/2020   IR INTRAVSC STENT CERV CAROTID W/EMB-PROT MOD SED INCL ANGIO  06/26/2020   IR IVUS EACH ADDITIONAL NON CORONARY VESSEL  06/26/2020   IR RADIOLOGIST EVAL & MGMT  05/28/2020   IR RADIOLOGIST EVAL & MGMT  07/15/2020   IR RADIOLOGIST EVAL & MGMT  07/27/2020   IR US GUIDE VASC ACCESS LEFT  06/26/2020   IR US GUIDE VASC ACCESS RIGHT  05/11/2020   IR US GUIDE VASC ACCESS RIGHT  06/08/2020   IR US GUIDE VASC ACCESS RIGHT  06/26/2020   IR US GUIDE VASC ACCESS RIGHT  06/09/2021   RADIOLOGY WITH ANESTHESIA N/A 05/09/2015   Procedure: RADIOLOGY WITH ANESTHESIA;  Surgeon: Luanne Bras, MD;  Location: [place];  Service: Radiology;  Laterality: N/A;   RADIOLOGY WITH ANESTHESIA N/A 06/08/2020   Procedure: IR WITH ANESTHESIA ANGIOPLASTY;  Surgeon: Luanne Bras, MD;  Location: [place];  Service: Radiology;  Laterality: N/A;   RADIOLOGY WITH ANESTHESIA N/A 06/26/2020   Procedure: REVASCULATION;  Surgeon: Luanne Bras, MD;  Location: [place];  Service: Radiology;  Laterality: N/A;   TUBAL LIGATION      Prior to Admission medications   Medication Sig Start Date End Date Taking? Authorizing Provider  acetaminophen (TYLENOL) 500 MG tablet Take 500 mg by mouth every 6 (six) hours as needed for moderate pain or headache.   Yes [provider]  aspirin EC 81 MG tablet Take 81 mg by mouth in the morning. Swallow whole.   Yes [provider]  atorvastatin (LIPITOR) 40 MG tablet Take 40 mg by mouth in the morning.   Yes [provider]  Cholecalciferol (DIALYVITE VITAMIN D 5000) 125 MCG  (5000 UT) capsule Take 5,000 Units by mouth daily.   Yes [provider]  clopidogrel (PLAVIX) 75 MG tablet Take 1 tablet (75 mg total) by mouth daily with breakfast. 05/21/15  Yes Angiulli, Lavon Paganini, PA-C  Cyanocobalamin (VITAMIN B-12) 2500 MCG SUBL Take 2,500 mcg by mouth in the morning.   Yes [provider]  ibuprofen (ADVIL) 800 MG tablet Take 800 mg by mouth every 8 (eight) hours as needed for moderate pain.   Yes [provider]  pantoprazole (PROTONIX) 40 MG tablet Take 1 tablet (40 mg total) by mouth 2 (two) times daily. Patient taking differently: Take 40 mg by mouth in the morning. 05/21/15  Yes Angiulli, Lavon Paganini, PA-C  potassium chloride SA (KLOR-CON M) 20 MEQ tablet Take 1 tablet (20 mEq total) by mouth 2 (two) times daily for 14 days. 04/12/22 04/26/22 Yes Barbee Cough, MD    Current Facility-Administered Medications  Medication Dose Route Frequency  Provider Last Rate Last Admin   0.9 %  sodium chloride infusion   Intravenous Continuous Regalado, Belkys A, MD       acetaminophen (TYLENOL) tablet 650 mg  650 mg Oral Q6H PRN Shela Leff, MD       Or   acetaminophen (TYLENOL) suppository 650 mg  650 mg Rectal Q6H PRN Shela Leff, MD       guaiFENesin (MUCINEX) 12 hr tablet 600 mg  600 mg Oral BID Regalado, Belkys A, MD       ipratropium-albuterol (DUONEB) 0.5-2.5 (3) MG/3ML nebulizer solution 3 mL  3 mL Nebulization QID Regalado, Belkys A, MD       magnesium sulfate IVPB 2 g 50 mL  2 g Intravenous Once Regalado, Belkys A, MD       morphine (PF) 2 MG/ML injection 0.5 mg  0.5 mg Intravenous Q4H PRN Regalado, Belkys A, MD       naloxone (NARCAN) injection 0.4 mg  0.4 mg Intravenous PRN Shela Leff, MD       ondansetron (ZOFRAN) injection 4 mg  4 mg Intravenous Q6H PRN Shela Leff, MD       pantoprazole (PROTONIX) injection 40 mg  40 mg Intravenous Q12H Regalado, Belkys A, MD       piperacillin-tazobactam (ZOSYN) IVPB 3.375 g   3.375 g Intravenous Q8H Shela Leff, MD 12.5 mL/hr at 04/17/22 0716 3.375 g at 04/17/22 0716   potassium chloride SA (KLOR-CON M) CR tablet 40 mEq  40 mEq Oral Q4H Regalado, Belkys A, MD       Current Outpatient Medications  Medication Sig Dispense Refill   acetaminophen (TYLENOL) 500 MG tablet Take 500 mg by mouth every 6 (six) hours as needed for moderate pain or headache.     aspirin EC 81 MG tablet Take 81 mg by mouth in the morning. Swallow whole.     atorvastatin (LIPITOR) 40 MG tablet Take 40 mg by mouth in the morning.     Cholecalciferol (DIALYVITE VITAMIN D 5000) 125 MCG (5000 UT) capsule Take 5,000 Units by mouth daily.     clopidogrel (PLAVIX) 75 MG tablet Take 1 tablet (75 mg total) by mouth daily with breakfast. 30 tablet 1   Cyanocobalamin (VITAMIN B-12) 2500 MCG SUBL Take 2,500 mcg by mouth in the morning.     ibuprofen (ADVIL) 800 MG tablet Take 800 mg by mouth every 8 (eight) hours as needed for moderate pain.     pantoprazole (PROTONIX) 40 MG tablet Take 1 tablet (40 mg total) by mouth 2 (two) times daily. (Patient taking differently: Take 40 mg by mouth in the morning.) 60 tablet 2   potassium chloride SA (KLOR-CON M) 20 MEQ tablet Take 1 tablet (20 mEq total) by mouth 2 (two) times daily for 14 days. 28 tablet 0    Allergies as of 04/16/2022 - Review Complete 04/16/2022  Allergen Reaction Noted   Cefadroxil Other (See Comments) 04/09/2021    Family History  Problem Relation Age of Onset   Cancer Mother    Hypertension Father    Heart attack Father    Cirrhosis Brother    Cancer Brother        In spine   Heart attack Brother       Physical Exam: General:   Alert,  well-nourished, pleasant and cooperative in NAD.  She is hard of hearing. Head:  Normocephalic and atraumatic. Eyes:  Sclera clear, no icterus.   Conjunctiva pink. Ears:  Normal auditory acuity. Nose:  No deformity, discharge,  or lesions. Mouth:  No deformity or lesions.   Neck:  Supple;  no masses or thyromegaly. Lungs:  Clear throughout to auscultation.   No wheezes. Heart:  Regular rate and rhythm; no murmurs. Abdomen:  Soft, show obesity, nontender, nondistended, normal bowel sounds, no rebound or guarding. No hepatosplenomegaly.   Rectal:  Deferred  Msk:  Symmetrical. No boney deformities LAD: No inguinal or umbilical LAD Extremities:  No clubbing or edema. Neurologic:  Alert and  oriented x4;  grossly nonfocal Skin:  Intact without significant lesions or rashes. Psych:  Alert and cooperative. Normal mood and affect.   Lab Results: Recent Labs    04/16/22 1749 04/17/22 0158  WBC 14.7* 13.1*  HGB 11.7* 10.6*  HCT 38.2 33.3*  PLT 419* 353   BMET Recent Labs    04/16/22 1749 04/17/22 0158  NA 135 134*  K 2.6* 3.1*  CL 91* 96*  CO2 28 28  GLUCOSE 106* 112*  BUN 12 8  CREATININE 0.87 0.79  CALCIUM 9.4 8.4*   LFT Recent Labs    04/16/22 1749  PROT 6.5  ALBUMIN 3.3*  AST 14*  ALT 8  ALKPHOS 111  BILITOT 0.6     Studies/Results: DG CHEST PORT 1 VIEW  Result Date: 04/17/2022 CLINICAL DATA:  Lower abdominal pain with nausea and vomiting. EXAM: PORTABLE CHEST 1 VIEW COMPARISON:  May 15, 2015 FINDINGS: The heart size and mediastinal contours are within normal limits. There is moderate severity calcification of the aortic arch. Low lung volumes are noted with mild atelectatic changes seen within the bilateral lung bases. There is no evidence of a pleural effusion or pneumothorax. A radiopaque stent is seen overlying the soft tissues of left neck. The visualized skeletal structures are unremarkable. IMPRESSION: Low lung volumes with mild bibasilar atelectasis. Electronically Signed   By: Virgina Norfolk M.D.   On: 04/17/2022 00:29   CT Abdomen Pelvis W Contrast  Result Date: 04/16/2022 CLINICAL DATA:  Lower abdominal pain EXAM: CT ABDOMEN AND PELVIS WITH CONTRAST TECHNIQUE: Multidetector CT imaging of the abdomen and pelvis was performed using the  standard protocol following bolus administration of intravenous contrast. RADIATION DOSE REDUCTION: This exam was performed according to the departmental dose-optimization program which includes automated exposure control, adjustment of the mA and/or kV according to patient size and/or use of iterative reconstruction technique. CONTRAST:  39m OMNIPAQUE IOHEXOL 350 MG/ML SOLN COMPARISON:  04/05/2021 FINDINGS: Lower chest: No acute abnormality.  Small hiatal hernia. Hepatobiliary: Stable hypodensities within segment 4B and segment 5 likely representing small hepatic cysts. Liver otherwise unremarkable. No intra or extrahepatic biliary ductal dilation. Gallbladder unremarkable. Pancreas: Unremarkable Spleen: Unremarkable Adrenals/Urinary Tract: The adrenal glands are unremarkable. The kidneys are normal. The bladder is unremarkable. Stomach/Bowel: There is circumferential wall thickening involving the mid sigmoid colon with associated hyperemia suspicious for an infiltrative mass in this location. Focal inflammatory changes as can be seen with inflammatory bowel disease could appear similarly. There is infiltration within the adjacent sigmoid mesentery best seen on axial image # 69/3 suspicious for transmural infiltration. This infiltrative change appears to abut several loops of small bowel within the deep pelvis, best seen on sagittal image # 91/7. Mild proximal sigmoid diverticulosis. No evidence of obstruction. Stomach, small bowel, and large bowel are otherwise unremarkable. Appendix normal. Vascular/Lymphatic: There is pathologic left periaortic lymphadenopathy with an index lymph node measuring 2.1 x 2.5 cm at axial image # 36/3. 6 mm enhancing nodule within the sigmoid mesocolon and  axial image # 72/3 likely represents a pathologic mesenteric lymph node or small omental implant. Moderate aortoiliac atherosclerotic calcification. Bilateral common iliac artery stenting has been performed. No aortic aneurysm.  Reproductive: Status post hysterectomy. No adnexal masses. Other: Small fat containing left inguinal hernia. Musculoskeletal: No acute bone abnormality. No lytic or blastic bone lesion. IMPRESSION: 1. Circumferential wall thickening involving the mid sigmoid colon with associated hyperemia suspicious for an infiltrative mass in this location. Associated infiltration within the sigmoid mesocolon suggests transmural infiltration. Associated pathologic adenopathy within the sigmoid mesentery and left periaortic lymph node group. No evidence of obstruction. Correlation with endoscopy is recommended for further evaluation. Aortic Atherosclerosis (ICD10-I70.0). Electronically Signed   By: Fidela Salisbury M.D.   On: 04/16/2022 20:44      Chibuike Fleek L. Tarri Glenn, MD, MPH 04/17/2022, 9:01 AM

## 2022-04-18 ENCOUNTER — Telehealth: Payer: Self-pay | Admitting: Oncology

## 2022-04-18 ENCOUNTER — Inpatient Hospital Stay (HOSPITAL_COMMUNITY): Payer: Medicare HMO | Admitting: Anesthesiology

## 2022-04-18 ENCOUNTER — Encounter (HOSPITAL_COMMUNITY): Payer: Self-pay | Admitting: Internal Medicine

## 2022-04-18 ENCOUNTER — Encounter (HOSPITAL_COMMUNITY)
Admission: EM | Disposition: A | Discharge status: Hospice | Payer: Self-pay | Source: Home / Self Care | Attending: Internal Medicine

## 2022-04-18 DIAGNOSIS — D630 Anemia in neoplastic disease: Secondary | ICD-10-CM

## 2022-04-18 DIAGNOSIS — K648 Other hemorrhoids: Secondary | ICD-10-CM

## 2022-04-18 DIAGNOSIS — C187 Malignant neoplasm of sigmoid colon: Secondary | ICD-10-CM | POA: Diagnosis not present

## 2022-04-18 DIAGNOSIS — K6389 Other specified diseases of intestine: Secondary | ICD-10-CM | POA: Diagnosis not present

## 2022-04-18 DIAGNOSIS — G4730 Sleep apnea, unspecified: Secondary | ICD-10-CM | POA: Diagnosis not present

## 2022-04-18 DIAGNOSIS — K5669 Other partial intestinal obstruction: Secondary | ICD-10-CM | POA: Diagnosis not present

## 2022-04-18 HISTORY — PX: COLONOSCOPY WITH PROPOFOL: SHX5780

## 2022-04-18 HISTORY — PX: SUBMUCOSAL TATTOO INJECTION: SHX6856

## 2022-04-18 HISTORY — PX: BIOPSY: SHX5522

## 2022-04-18 LAB — CBC
HCT: 31.5 % — ABNORMAL LOW (ref 36.0–46.0)
Hemoglobin: 9.8 g/dL — ABNORMAL LOW (ref 12.0–15.0)
MCH: 24 pg — ABNORMAL LOW (ref 26.0–34.0)
MCHC: 31.1 g/dL (ref 30.0–36.0)
MCV: 77 fL — ABNORMAL LOW (ref 80.0–100.0)
Platelets: 345 10*3/uL (ref 150–400)
RBC: 4.09 MIL/uL (ref 3.87–5.11)
RDW: 19.7 % — ABNORMAL HIGH (ref 11.5–15.5)
WBC: 13.6 10*3/uL — ABNORMAL HIGH (ref 4.0–10.5)
nRBC: 0 % (ref 0.0–0.2)

## 2022-04-18 LAB — BASIC METABOLIC PANEL
Anion gap: 12 (ref 5–15)
BUN: 8 mg/dL (ref 8–23)
CO2: 25 mmol/L (ref 22–32)
Calcium: 8.2 mg/dL — ABNORMAL LOW (ref 8.9–10.3)
Chloride: 100 mmol/L (ref 98–111)
Creatinine, Ser: 0.81 mg/dL (ref 0.44–1.00)
GFR, Estimated: >60 mL/min (ref >60)
Glucose, Bld: 94 mg/dL (ref 70–99)
Potassium: 3.7 mmol/L (ref 3.5–5.1)
Sodium: 137 mmol/L (ref 135–145)

## 2022-04-18 SURGERY — COLONOSCOPY WITH PROPOFOL
Anesthesia: Monitor Anesthesia Care

## 2022-04-18 MED ORDER — AMLODIPINE BESYLATE 5 MG PO TABS: 5.0000 mg | ORAL_TABLET | Freq: Every day | ORAL | Status: DC

## 2022-04-18 MED ORDER — POTASSIUM CHLORIDE CRYS ER 20 MEQ PO TBCR
40.0000 meq | EXTENDED_RELEASE_TABLET | Freq: Every day | ORAL | Status: DC
  Administered 2022-04-18 – 2022-04-25 (×8): 40 meq via ORAL
  Filled 2022-04-18 (×8): qty 2

## 2022-04-18 MED ORDER — LIDOCAINE 2% (20 MG/ML) 5 ML SYRINGE
INTRAMUSCULAR | Status: DC | PRN
  Administered 2022-04-18: 60 mg via INTRAVENOUS

## 2022-04-18 MED ORDER — IPRATROPIUM-ALBUTEROL 0.5-2.5 (3) MG/3ML IN SOLN: 3.0000 mL | RESPIRATORY_TRACT | Status: DC | PRN

## 2022-04-18 MED ORDER — ASPIRIN 81 MG PO CHEW
81.0000 mg | CHEWABLE_TABLET | Freq: Every day | ORAL | Status: DC
  Administered 2022-04-18 – 2022-04-25 (×8): 81 mg via ORAL
  Filled 2022-04-18 (×8): qty 1

## 2022-04-18 MED ORDER — PROPOFOL 500 MG/50ML IV EMUL
INTRAVENOUS | Status: DC | PRN
  Administered 2022-04-18: 150 ug/kg/min via INTRAVENOUS

## 2022-04-18 MED ORDER — LACTATED RINGERS IV SOLN: INTRAVENOUS | Status: DC

## 2022-04-18 MED ORDER — SPOT INK MARKER SYRINGE KIT
PACK | SUBMUCOSAL | Status: DC | PRN
  Administered 2022-04-18: 1 mL via SUBMUCOSAL

## 2022-04-18 MED ORDER — BOOST / RESOURCE BREEZE PO LIQD CUSTOM
1.0000 | Freq: Three times a day (TID) | ORAL | Status: DC
  Administered 2022-04-18 – 2022-04-25 (×19): 1 via ORAL

## 2022-04-18 MED ORDER — POLYETHYLENE GLYCOL 3350 17 G PO PACK
17.0000 g | PACK | Freq: Every day | ORAL | Status: DC
  Administered 2022-04-21 – 2022-04-25 (×5): 17 g via ORAL
  Filled 2022-04-18 (×6): qty 1

## 2022-04-18 MED ORDER — SODIUM CHLORIDE 0.9 % IV SOLN: INTRAVENOUS | Status: DC

## 2022-04-18 MED ORDER — LACTATED RINGERS IV SOLN: Freq: Once | INTRAVENOUS | Status: AC

## 2022-04-18 SURGICAL SUPPLY — 22 items

## 2022-04-18 NOTE — Anesthesia Preprocedure Evaluation (Addendum)
Anesthesia Evaluation  Patient identified by MRN, date of birth, ID band Patient awake    Reviewed: Allergy & Precautions, NPO status , Patient's Chart, lab work & pertinent test results  History of Anesthesia Complications Negative for: history of anesthetic complications  Airway Mallampati: III  TM Distance: >3 FB Neck ROM: Full    Dental  (+) Upper Dentures, Lower Dentures Patient reports she is unable to get her dentures out because they are glued in so tightly.:   Pulmonary neg shortness of breath, sleep apnea , neg COPD, neg recent URI, Current Smoker   Pulmonary exam normal breath sounds clear to auscultation       Cardiovascular hypertension, (-) angina + Peripheral Vascular Disease and +CHF  (-) Past MI, (-) Cardiac Stents and (-) CABG (-) dysrhythmias  Rhythm:Regular Rate:Normal  HLD, carotid artery occlusion  TTE 05/10/2015: Study Conclusions  - Left ventricle: The cavity size was normal. Wall thickness was increased in a pattern of mild LVH. Systolic function was normal. The estimated ejection fraction was in the range of 55% to 60%. Wall motion was normal; there were no regional wall motion abnormalities. Doppler parameters are consistent with abnormal left ventricular relaxation (grade 1 diastolic dysfunction). - Left atrium: The atrium was mildly dilated. - Pulmonary arteries: Systolic pressure was mildly increased. PA peak pressure: 34 mm Hg (S).   Neuro/Psych neg Seizures CVA (05/09/2015), No Residual Symptoms    GI/Hepatic Neg liver ROS, Bowel prep,GERD  Medicated,,Lynch syndrome   Endo/Other  diabetes (patient denies), Type 2Hypothyroidism    Renal/GU negative Renal ROS     Musculoskeletal   Abdominal  (+) + obese  Peds  Hematology  (+) Blood dyscrasia, anemia   Anesthesia Other Findings 72 year old with past medical history significant for endometrial cancer, carotid artery  occlusion, stroke, hyperlipidemia, anemia, sleep apnea not on CPAP, PVD, GERD, bilateral hearing loss, tobacco use presents complaining of lower abdominal pain, nausea vomiting.  She was found to have leukocytosis white blood cell 14.  Last Plavix: 04/13/2022  Reproductive/Obstetrics                             Anesthesia Physical Anesthesia Plan  ASA: 3  Anesthesia Plan: MAC   Post-op Pain Management: Minimal or no pain anticipated   Induction: Intravenous  PONV Risk Score and Plan: 1 and Propofol infusion and Treatment may vary due to age or medical condition  Airway Management Planned: Natural Airway and Nasal Cannula  Additional Equipment:   Intra-op Plan:   Post-operative Plan:   Informed Consent: I have reviewed the patients History and Physical, chart, labs and discussed the procedure including the risks, benefits and alternatives for the proposed anesthesia with the patient or authorized representative who has indicated his/her understanding and acceptance.   Patient has DNR.  Discussed DNR with patient and Suspend DNR.   Dental advisory given  Plan Discussed with: CRNA and Anesthesiologist  Anesthesia Plan Comments: (Discussed with patient risks of MAC including, but not limited to, minor pain or discomfort, hearing people in the room, and possible need for backup general anesthesia. Risks for general anesthesia also discussed including, but not limited to, sore throat, hoarse voice, chipped/damaged teeth, injury to vocal cords, nausea and vomiting, allergic reactions, lung infection, heart attack, stroke, and death. All questions answered. )        Anesthesia Quick Evaluation

## 2022-04-18 NOTE — Consult Note (Signed)
CARDIOLOGY CONSULT NOTE  Patient ID: Monica Pugh MRN: [id number] DOB/AGE: [birth year] 72 y.o.  Admit date: 04/16/2022 Referring Physician: [facility] surgery Reason for Consultation: Preop cardiac restratification  HPI:   72 y.o. Caucasian female  with hyperlipidemia, smoker, h/o stroke (2017, treated by clot retrieval and left ICA stent placement, 50% ISR on cerebral angiogram in 05/2017), known right carotid occlusion, endometrial cancer treated with hysterectomy, now admitted with new diagnosis of colon mass.  Surgery is considering inpatient sigmoid resection.  Cardiology consulted for perioperative cardiac risk stratification.  Patient has been having nausea and vomiting for last couple of days.  Workup showed partially obstructive sigmoid colon mass.  At baseline, patient is fairly sedentary lifestyle, but denies any chest pain, shortness of breath symptoms.  She reportedly had echocardiogram performed in [facility] prior to hysterectomy in 2023, results not available to me.  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  While this was thought to be >75% on ultrasound, cerebral angiography in 05/2021 showed 50% ISR with brisk flow.  She underwent hysterectomy safely without any perioperative MI or stroke.    Past Medical History:  Diagnosis Date   Anemia    Carotid artery occlusion    GERD (gastroesophageal reflux disease)    Hearing loss    bilateral - no hearing aids   Hyperlipidemia    Intractable nausea and vomiting 04/16/2022   Peripheral vascular disease (HCC)    Sleep apnea    does not use cpap   Stroke [facility])    Vitamin B 12 deficiency    Vitamin D deficiency      Past Surgical History:  Procedure Laterality Date   ABDOMINAL HYSTERECTOMY     COLONOSCOPY     IR ANGIO INTRA EXTRACRAN SEL COM CAROTID INNOMINATE UNI R MOD SED  05/11/2020   IR ANGIO INTRA EXTRACRAN SEL INTERNAL  CAROTID UNI L MOD SED  05/11/2020   IR ANGIO INTRA EXTRACRAN SEL INTERNAL CAROTID UNI L MOD SED  06/08/2020   IR ANGIO INTRA EXTRACRAN SEL INTERNAL CAROTID UNI L MOD SED  06/26/2020   IR ANGIO INTRA EXTRACRAN SEL INTERNAL CAROTID UNI L MOD SED  06/09/2021   IR ANGIO VERTEBRAL SEL SUBCLAVIAN INNOMINATE BILAT MOD SED  05/11/2020   IR ANGIOGRAM EXTREMITY BILATERAL  06/08/2020   IR CT HEAD LTD  06/26/2020   IR ILIAC ART STENT INC PTA MOD SED  06/26/2020   IR ILIAC ART STENT INC PTA MOD SED  06/26/2020   IR INTRAVSC STENT CERV CAROTID W/EMB-PROT MOD SED INCL ANGIO  06/26/2020   IR IVUS EACH ADDITIONAL NON CORONARY VESSEL  06/26/2020   IR RADIOLOGIST EVAL & MGMT  05/28/2020   IR RADIOLOGIST EVAL & MGMT  07/15/2020   IR RADIOLOGIST EVAL & MGMT  07/27/2020   IR US GUIDE VASC ACCESS LEFT  06/26/2020   IR US GUIDE VASC ACCESS RIGHT  05/11/2020   IR US GUIDE VASC ACCESS RIGHT  06/08/2020   IR US GUIDE VASC ACCESS RIGHT  06/26/2020   IR US GUIDE VASC ACCESS RIGHT  06/09/2021   RADIOLOGY WITH ANESTHESIA N/A 05/09/2015   Procedure: RADIOLOGY WITH ANESTHESIA;  Surgeon: Luanne Bras, MD;  Location: [place];  Service: Radiology;  Laterality: N/A;   RADIOLOGY WITH ANESTHESIA N/A 06/08/2020   Procedure: IR WITH ANESTHESIA ANGIOPLASTY;  Surgeon: Luanne Bras, MD;  Location: [place];  Service: Radiology;  Laterality: N/A;  RADIOLOGY WITH ANESTHESIA N/A 06/26/2020   Procedure: REVASCULATION;  Surgeon: Luanne Bras, MD;  Location: [place];  Service: Radiology;  Laterality: N/A;   TUBAL LIGATION        Family History  Problem Relation Age of Onset   Cancer Mother    Hypertension Father    Heart attack Father    Cirrhosis Brother    Cancer Brother        In spine   Heart attack Brother      Social History: Social History   Socioeconomic History   Marital status: Married    Spouse name: Not on file   Number of children: Not on file   Years of education: Not on file   Highest  education level: Not on file  Occupational History   Not on file  Tobacco Use   Smoking status: Every Day    Packs/day: 0.50    Years: 40.00    Total pack years: 20.00    Types: Cigarettes   Smokeless tobacco: Never   Tobacco comments:    1-1 1/2 ppd  Vaping Use   Vaping Use: Never used  Substance and Sexual Activity   Alcohol use: Not Currently    Alcohol/week: 0.0 standard drinks of alcohol   Drug use: Never   Sexual activity: Not on file  Other Topics Concern   Not on file  Social History Narrative   Not on file   Social Determinants of Health   Financial Resource Strain: Not on file  Food Insecurity: No Food Insecurity (04/17/2022)   Hunger Vital Sign    Worried About Running Out of Food in the Last Year: Never true    Ran Out of Food in the Last Year: Never true  Transportation Needs: No Transportation Needs (04/17/2022)   PRAPARE - Hydrologist (Medical): No    Lack of Transportation (Non-Medical): No  Physical Activity: Not on file  Stress: Not on file  Social Connections: Not on file  Intimate Partner Violence: Not At Risk (04/17/2022)   Humiliation, Afraid, Rape, and Kick questionnaire    Fear of Current or Ex-Partner: No    Emotionally Abused: No    Physically Abused: No    Sexually Abused: No     Medications Prior to Admission  Medication Sig Dispense Refill Last Dose   acetaminophen (TYLENOL) 500 MG tablet Take 500 mg by mouth every 6 (six) hours as needed for moderate pain or headache.   unknown   aspirin EC 81 MG tablet Take 81 mg by mouth in the morning. Swallow whole.   Past Week   atorvastatin (LIPITOR) 40 MG tablet Take 40 mg by mouth in the morning.   Past Week   Cholecalciferol (DIALYVITE VITAMIN D 5000) 125 MCG (5000 UT) capsule Take 5,000 Units by mouth daily.   Past Week   clopidogrel (PLAVIX) 75 MG tablet Take 1 tablet (75 mg total) by mouth daily with breakfast. 30 tablet 1 Past Week   Cyanocobalamin (VITAMIN B-12)  2500 MCG SUBL Take 2,500 mcg by mouth in the morning.   Past Week   ibuprofen (ADVIL) 800 MG tablet Take 800 mg by mouth every 8 (eight) hours as needed for moderate pain.   unknown   pantoprazole (PROTONIX) 40 MG tablet Take 1 tablet (40 mg total) by mouth 2 (two) times daily. (Patient taking differently: Take 40 mg by mouth in the morning.) 60 tablet 2 Past Week   potassium chloride SA (KLOR-CON  M) 20 MEQ tablet Take 1 tablet (20 mEq total) by mouth 2 (two) times daily for 14 days. 28 tablet 0 Past Week    Review of Systems  Cardiovascular:  Negative for chest pain, dyspnea on exertion, leg swelling, palpitations and syncope.  Gastrointestinal:  Positive for nausea and vomiting.      Physical Exam: Physical Exam Vitals and nursing note reviewed.  Constitutional:      General: She is not in acute distress. Neck:     Vascular: No JVD.  Cardiovascular:     Rate and Rhythm: Normal rate and regular rhythm.     Pulses:          Carotid pulses are  on the right side with bruit and  on the left side with bruit.    Heart sounds: Murmur heard.     Harsh midsystolic murmur is present with a grade of 2/6 at the upper right sternal border radiating to the neck.  Pulmonary:     Effort: Pulmonary effort is normal.     Breath sounds: Normal breath sounds. No wheezing or rales.  Musculoskeletal:     Right lower leg: No edema.     Left lower leg: No edema.        Imaging/tests reviewed and independently interpreted: Lab Results: CBC, BMP  Cardiac Studies:  Telemetry 04/18/2022: Intermittent ventricular bigeminy   EKG 04/18/2022: Possible ectopic atrial rhythm 75 bpm Nonspecific T wave abnormality  Echocardiogram: Ordered  Carotid US: Ordered  Assessment & Recommendations:  72 y.o. Caucasian female  with hyperlipidemia, smoker, h/o stroke (2017, treated by clot retrieval and left ICA stent placement, 50% ISR on cerebral angiogram in 05/2017), known right carotid occlusion, now  admitted with new diagnosis of colon mass.  Surgery is considering inpatient sigmoid resection.  Cardiology consulted for perioperative cardiac restratification.  Preop cardiac risk stratification: No angina or heart failure symptoms at baseline. 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 Resting EKG with intermittent ventricular bigeminy in the setting of mild hypokalemia. Recommend echocardiogram and carotid vascular ultrasound.  I do expect to see some degree of ISR in the left carotid.  Provided it is no worse than prior study in 05/2021, overall risk of perioperative stroke is low.  Regardless, recommend avoiding prolonged period of hypotension. Provided no significant EF reduction on echocardiogram, hyperreactive cardiac risk is elevated owing to her underlying risk factors, but not at all prohibitive. She is currently on plavix, given her prior carotid stenting.  Plavix will need to be held for 5 days to reduce perioperative bleeding risk of colon mass.  I will review echocardiogram and carotid ultrasound, once they are performed.  I will add any additional recommendations based on those results.    Discussed interpretation of tests and management recommendations with the primary team     Nigel Mormon, MD Pager: [phone] Office: [phone]

## 2022-04-18 NOTE — Consult Note (Cosign Needed Addendum)
Monica Pugh Jun 20, 1950  [id number].    Requesting MD: Dr. Niel Hummer Chief Complaint/Reason for Consult: Colon Mass  HPI: Monica Pugh is a 72 y.o. female with a hx of endometrial cancer s/p robotic assisted TAH/BSO with b/l ureterolysis by Dr. Gwenlyn Found at [facility] 05/2021 (per Oncology/Dr. Federico Flake note she was recommended for adjuvant chemotherapy and XRT but declined), HLD, CVA on Plavix (last dose 2/20), OSA not on CPAP, tobacco use, PVD/PAD s/p iliac stents, carotid dz s/p L stent by Dr. Estanislado Pandy 2022 with > 75% stenosis in stent at area of native bifurcation and complete occlusion R ICA on most recent US, who presented to the ED for abdominal pain.   Patient reports over the last several months she has had constant lower abdominal pain. She initial thought this was all related to her hysterectomy. Associated worsening constipation requiring Miralax several times a week to have small, loose bm's. Last week she developed n/v which prompted her to come to the ED. CT showed area concerning for mid sigmoid colon mass with associated infiltration within the sigmoid mesocolon suggestion transmural infiltration + associated pathologic adenopathy within the sigmoid mesentery and left periaortic lymph node group. She was admitted to [facility] and GI was consulted.   She underwent colonoscopy 2/26 that showed frond-like/villous and polypoid partially obstructing mass involving 2/3 of the lumen circumference located in sigmoid colon measuring 5cm in length. The mass was unable to be traversed with ultraslim colonoscope. Area of distal mass was tattooed. Bx pending. Her hgb has been overall stable here and she has not required blood transfusions. She is tolerating cld without n/v and having bm's with the last this am prior to colonoscopy. Denies being prepped prior to colonoscopy. Looks like she only received Dulcolax po the night prior. She denies any recent bloody stool or melena but does reports several weeks ago  had a few episodes of brb per rectum after straining hard.   Her last colonoscopy was approximately 10 years ago with Dr. [name] in [place]. She reports benign polyps removed at that time   She denies family hx of colon ca. Reports her mother had lung cancer and her brother had a different type of cancer the family is unsure of but know it spread to his bones.   Denies other abdominal surgeries than as listed above. She is on o2 here which she is not on at baseline. Denies sob. She can normally walk at least 70 years without assistive device without becoming sob.   ROS: ROS As above, see HPI  Family History  Problem Relation Age of Onset   Cancer Mother    Hypertension Father    Heart attack Father    Cirrhosis Brother    Cancer Brother        In spine   Heart attack Brother     Past Medical History:  Diagnosis Date   Anemia    Carotid artery occlusion    GERD (gastroesophageal reflux disease)    Hearing loss    bilateral - no hearing aids   Hyperlipidemia    Intractable nausea and vomiting 04/16/2022   Peripheral vascular disease (HCC)    Sleep apnea    does not use cpap   Stroke ([place])    Vitamin B 12 deficiency    Vitamin D deficiency     Past Surgical History:  Procedure Laterality Date   ABDOMINAL HYSTERECTOMY     COLONOSCOPY     IR ANGIO INTRA EXTRACRAN SEL COM  CAROTID INNOMINATE UNI R MOD SED  05/11/2020   IR ANGIO INTRA EXTRACRAN SEL INTERNAL CAROTID UNI L MOD SED  05/11/2020   IR ANGIO INTRA EXTRACRAN SEL INTERNAL CAROTID UNI L MOD SED  06/08/2020   IR ANGIO INTRA EXTRACRAN SEL INTERNAL CAROTID UNI L MOD SED  06/26/2020   IR ANGIO INTRA EXTRACRAN SEL INTERNAL CAROTID UNI L MOD SED  06/09/2021   IR ANGIO VERTEBRAL SEL SUBCLAVIAN INNOMINATE BILAT MOD SED  05/11/2020   IR ANGIOGRAM EXTREMITY BILATERAL  06/08/2020   IR CT HEAD LTD  06/26/2020   IR ILIAC ART STENT INC PTA MOD SED  06/26/2020   IR ILIAC ART STENT INC PTA MOD SED  06/26/2020   IR INTRAVSC STENT  CERV CAROTID W/EMB-PROT MOD SED INCL ANGIO  06/26/2020   IR IVUS EACH ADDITIONAL NON CORONARY VESSEL  06/26/2020   IR RADIOLOGIST EVAL & MGMT  05/28/2020   IR RADIOLOGIST EVAL & MGMT  07/15/2020   IR RADIOLOGIST EVAL & MGMT  07/27/2020   IR US GUIDE VASC ACCESS LEFT  06/26/2020   IR US GUIDE VASC ACCESS RIGHT  05/11/2020   IR US GUIDE VASC ACCESS RIGHT  06/08/2020   IR US GUIDE VASC ACCESS RIGHT  06/26/2020   IR US GUIDE VASC ACCESS RIGHT  06/09/2021   RADIOLOGY WITH ANESTHESIA N/A 05/09/2015   Procedure: RADIOLOGY WITH ANESTHESIA;  Surgeon: Luanne Bras, MD;  Location: [facility];  Service: Radiology;  Laterality: N/A;   RADIOLOGY WITH ANESTHESIA N/A 06/08/2020   Procedure: IR WITH ANESTHESIA ANGIOPLASTY;  Surgeon: Luanne Bras, MD;  Location: [place];  Service: Radiology;  Laterality: N/A;   RADIOLOGY WITH ANESTHESIA N/A 06/26/2020   Procedure: REVASCULATION;  Surgeon: Luanne Bras, MD;  Location: [employer];  Service: Radiology;  Laterality: N/A;   TUBAL LIGATION      Social History:  reports that she has been smoking cigarettes. She has a 20.00 pack-year smoking history. She has never used smokeless tobacco. She reports that she does not currently use alcohol. She reports that she does not use drugs.  Allergies:  Allergies  Allergen Reactions   Cefadroxil Other (See Comments)    unknown    Medications Prior to Admission  Medication Sig Dispense Refill   acetaminophen (TYLENOL) 500 MG tablet Take 500 mg by mouth every 6 (six) hours as needed for moderate pain or headache.     aspirin EC 81 MG tablet Take 81 mg by mouth in the morning. Swallow whole.     atorvastatin (LIPITOR) 40 MG tablet Take 40 mg by mouth in the morning.     Cholecalciferol (DIALYVITE VITAMIN D 5000) 125 MCG (5000 UT) capsule Take 5,000 Units by mouth daily.     clopidogrel (PLAVIX) 75 MG tablet Take 1 tablet (75 mg total) by mouth daily with breakfast. 30 tablet 1   Cyanocobalamin (VITAMIN B-12) 2500  MCG SUBL Take 2,500 mcg by mouth in the morning.     ibuprofen (ADVIL) 800 MG tablet Take 800 mg by mouth every 8 (eight) hours as needed for moderate pain.     pantoprazole (PROTONIX) 40 MG tablet Take 1 tablet (40 mg total) by mouth 2 (two) times daily. (Patient taking differently: Take 40 mg by mouth in the morning.) 60 tablet 2   potassium chloride SA (KLOR-CON M) 20 MEQ tablet Take 1 tablet (20 mEq total) by mouth 2 (two) times daily for 14 days. 28 tablet 0     Physical Exam: Blood pressure 117/72, pulse  68, temperature 98.5 F (36.9 C), resp. rate (!) 24, height '5\' 3"'$  (1.6 m), weight 83.2 kg, SpO2 96 %. General: pleasant, WD/WN female who is laying in bed in NAD HEENT: head is normocephalic, atraumatic.  Sclera are noninjected.  PERRL.  Ears and nose without any masses or lesions.  Mouth is pink and moist. Dentition fair Heart: regular, rate, and rhythm.  Palpable pedal pulses bilaterally  Lungs: CTAB, no wheezes, rhonchi, or rales noted.  Respiratory effort nonlabored Abd:  Soft, ND, some lower abdominal ttp greatest in the LLQ without rigidity or guarding. +BS. No masses, hernias, or organomegaly. Prior robotic abdominal scars are well healed.  MS: no BUE or BLE edema, calves soft and nontender Skin: warm and dry  Psych: A&Ox4 with an appropriate affect Neuro: cranial nerves grossly intact, normal speech, thought process intact, moves all extremities, gait not assessed   Results for orders placed or performed during the hospital encounter of 04/16/22 (from the past 48 hour(s))  CBC with Differential     Status: Abnormal   Collection Time: 04/16/22  5:49 PM  Result Value Ref Range   WBC 14.7 (H) 4.0 - 10.5 K/uL   RBC 5.00 3.87 - 5.11 MIL/uL   Hemoglobin 11.7 (L) 12.0 - 15.0 g/dL   HCT 38.2 36.0 - 46.0 %   MCV 76.4 (L) 80.0 - 100.0 fL   MCH 23.4 (L) 26.0 - 34.0 pg   MCHC 30.6 30.0 - 36.0 g/dL   RDW 20.0 (H) 11.5 - 15.5 %   Platelets 419 (H) 150 - 400 K/uL   nRBC 0.0 0.0 - 0.2  %   Neutrophils Relative % 76 %   Neutro Abs 11.1 (H) 1.7 - 7.7 K/uL   Lymphocytes Relative 17 %   Lymphs Abs 2.5 0.7 - 4.0 K/uL   Monocytes Relative 7 %   Monocytes Absolute 1.0 0.1 - 1.0 K/uL   Eosinophils Relative 0 %   Eosinophils Absolute 0.1 0.0 - 0.5 K/uL   Basophils Relative 0 %   Basophils Absolute 0.1 0.0 - 0.1 K/uL   Immature Granulocytes 0 %   Abs Immature Granulocytes 0.04 0.00 - 0.07 K/uL    Comment: Performed at [facility], [address] [place], [place] [address]  Comprehensive metabolic panel     Status: Abnormal   Collection Time: 04/16/22  5:49 PM  Result Value Ref Range   Sodium 135 135 - 145 mmol/L   Potassium 2.6 (LL) 3.5 - 5.1 mmol/L    Comment: CRITICAL RESULT CALLED TO, READ BACK BY AND VERIFIED WITH T,SHEARIN RN '@1930'$  04/16/22 E,[place]   Chloride 91 (L) 98 - 111 mmol/L   CO2 28 22 - 32 mmol/L   Glucose, Bld 106 (H) 70 - 99 mg/dL    Comment: Glucose reference range applies only to samples taken after fasting for at least 8 hours.   BUN 12 8 - 23 mg/dL   Creatinine, Ser 0.87 0.44 - 1.00 mg/dL   Calcium 9.4 8.9 - 10.3 mg/dL   Total Protein 6.5 6.5 - 8.1 g/dL   Albumin 3.3 (L) 3.5 - 5.0 g/dL   AST 14 (L) 15 - 41 U/L   ALT 8 0 - 44 U/L   Alkaline Phosphatase 111 38 - 126 U/L   Total Bilirubin 0.6 0.3 - 1.2 mg/dL   GFR, Estimated >60 >60 mL/min    Comment: (NOTE) Calculated using the CKD-EPI Creatinine Equation (2021)    Anion gap 16 (H) 5 -  15    Comment: Performed at [facility], [place] [address], [place], [place] [address]  Lipase, blood     Status: None   Collection Time: 04/16/22  5:49 PM  Result Value Ref Range   Lipase 21 11 - 51 U/L    Comment: Performed at [facility] [address] [place], [place] [address]  Urinalysis, Routine w reflex microscopic -Urine, Catheterized     Status: Abnormal   Collection Time: 04/16/22  5:51 PM  Result Value Ref Range   Color, Urine AMBER (A) YELLOW    Comment: BIOCHEMICALS MAY BE  AFFECTED BY COLOR   APPearance HAZY (A) CLEAR   Specific Gravity, Urine 1.035 (H) 1.005 - 1.030   pH 5.0 5.0 - 8.0   Glucose, UA NEGATIVE NEGATIVE mg/dL   Hgb urine dipstick NEGATIVE NEGATIVE   Bilirubin Urine SMALL (A) NEGATIVE   Ketones, ur NEGATIVE NEGATIVE mg/dL   Protein, ur 30 (A) NEGATIVE mg/dL   Nitrite NEGATIVE NEGATIVE   Leukocytes,Ua NEGATIVE NEGATIVE   RBC / HPF 0-5 0 - 5 RBC/hpf   WBC, UA 0-5 0 - 5 WBC/hpf   Bacteria, UA RARE (A) NONE SEEN   Squamous Epithelial / HPF 6-10 0 - 5 /HPF   Mucus PRESENT    Hyaline Casts, UA PRESENT     Comment: Performed at [facility], [address], [place], [place] [address]  Resp panel by RT-PCR (RSV, Flu A&B, Covid) Anterior Nasal Swab     Status: None   Collection Time: 04/16/22  5:52 PM   Specimen: Anterior Nasal Swab  Result Value Ref Range   SARS Coronavirus 2 by RT PCR NEGATIVE NEGATIVE   Influenza A by PCR NEGATIVE NEGATIVE   Influenza B by PCR NEGATIVE NEGATIVE    Comment: (NOTE) The Xpert Xpress SARS-CoV-2/FLU/RSV plus assay is intended as an aid in the diagnosis of influenza from Nasopharyngeal swab specimens and should not be used as a sole basis for treatment. Nasal washings and aspirates are unacceptable for Xpert Xpress SARS-CoV-2/FLU/RSV testing.  Fact Sheet for Patients: EntrepreneurPulse.com.au  Fact Sheet for Healthcare Providers: IncredibleEmployment.be  This test is not yet approved or cleared by the [place] FDA and has been authorized for detection and/or diagnosis of SARS-CoV-2 by FDA under an Emergency Use Authorization (EUA). This EUA will remain in effect (meaning this test can be used) for the duration of the COVID-19 declaration under Section 564(b)(1) of the Act, 21 U.S.C. section 360bbb-3(b)(1), unless the authorization is terminated or revoked.     Resp Syncytial Virus by PCR NEGATIVE NEGATIVE    Comment: (NOTE) Fact Sheet for  Patients: EntrepreneurPulse.com.au  Fact Sheet for Healthcare Providers: IncredibleEmployment.be  This test is not yet approved or cleared by the [place] FDA and has been authorized for detection and/or diagnosis of SARS-CoV-2 by FDA under an Emergency Use Authorization (EUA). This EUA will remain in effect (meaning this test can be used) for the duration of the COVID-19 declaration under Section 564(b)(1) of the Act, 21 U.S.C. section 360bbb-3(b)(1), unless the authorization is terminated or revoked.  Performed at [facility], [place] [address], [place], [place] [address]   HIV Antibody (routine testing w rflx)     Status: None   Collection Time: 04/17/22  1:58 AM  Result Value Ref Range   HIV Screen 4th Generation wRfx Non Reactive Non Reactive    Comment: Performed at [facility], [place] [address],  [place], [place] [address]  CBC     Status: Abnormal   Collection Time: 04/17/22  1:58 AM  Result Value Ref Range   WBC 13.1 (H) 4.0 - 10.5 K/uL   RBC 4.37 3.87 - 5.11 MIL/uL   Hemoglobin 10.6 (L) 12.0 - 15.0 g/dL   HCT 33.3 (L) 36.0 - 46.0 %   MCV 76.2 (L) 80.0 - 100.0 fL   MCH 24.3 (L) 26.0 - 34.0 pg   MCHC 31.8 30.0 - 36.0 g/dL   RDW 19.6 (H) 11.5 - 15.5 %   Platelets 353 150 - 400 K/uL   nRBC 0.0 0.0 - 0.2 %    Comment: Performed at [facility] [address], [place], [place] [id number]  Basic metabolic panel     Status: Abnormal   Collection Time: 04/17/22  1:58 AM  Result Value Ref Range   Sodium 134 (L) 135 - 145 mmol/L   Potassium 3.1 (L) 3.5 - 5.1 mmol/L   Chloride 96 (L) 98 - 111 mmol/L   CO2 28 22 - 32 mmol/L   Glucose, Bld 112 (H) 70 - 99 mg/dL    Comment: Glucose reference range applies only to samples taken after fasting for at least 8 hours.   BUN 8 8 - 23 mg/dL   Creatinine, Ser 0.79 0.44 - 1.00 mg/dL   Calcium 8.4 (L) 8.9 - 10.3 mg/dL   GFR, Estimated >60 >60 mL/min    Comment: (NOTE) Calculated  using the CKD-EPI Creatinine Equation (2021)    Anion gap 10 5 - 15    Comment: Performed at [facility] [address], [place], [place] [address]  Magnesium     Status: None   Collection Time: 04/17/22  1:58 AM  Result Value Ref Range   Magnesium 1.7 1.7 - 2.4 mg/dL    Comment: Performed at [facility] [address], [place], [place] [address]  Vitamin B12     Status: Abnormal   Collection Time: 04/17/22 10:06 AM  Result Value Ref Range   Vitamin B-12 1,119 (H) 180 - 914 pg/mL    Comment: (NOTE) This assay is not validated for testing neonatal or myeloproliferative syndrome specimens for Vitamin B12 levels. Performed at [facility], [place] [address] [place], [place] [address]   Folate     Status: None   Collection Time: 04/17/22 10:06 AM  Result Value Ref Range   Folate 9.3 >5.9 ng/mL    Comment: Performed at [facility] [address] [place], [place] [address]  Iron and TIBC     Status: Abnormal   Collection Time: 04/17/22 10:06 AM  Result Value Ref Range   Iron 26 (L) 28 - 170 ug/dL   TIBC 385 250 - 450 ug/dL   Saturation Ratios 7 (L) 10.4 - 31.8 %   UIBC 359 ug/dL    Comment: Performed at [facility], [place] [address] [place], [place] [address]  Ferritin     Status: None   Collection Time: 04/17/22 10:06 AM  Result Value Ref Range   Ferritin 14 11 - 307 ng/mL    Comment: Performed at [facility], [place] [address], [place], [place] [address]  Reticulocytes     Status: None   Collection Time: 04/17/22 10:06 AM  Result Value Ref Range   Retic Ct Pct 1.2 0.4 - 3.1 %   RBC. 4.29 3.87 - 5.11 MIL/uL   Retic Count, Absolute  51.1 19.0 - 186.0 K/uL   Immature Retic Fract 13.4 2.3 - 15.9 %    Comment: Performed at [facility] [address], [place], [place] [id number]  Basic metabolic panel     Status: Abnormal   Collection Time: 04/18/22  7:54 AM  Result Value Ref Range   Sodium 137 135 - 145 mmol/L   Potassium 3.7 3.5 -  5.1 mmol/L   Chloride 100 98 - 111 mmol/L   CO2 25 22 - 32 mmol/L   Glucose, Bld 94 70 - 99 mg/dL    Comment: Glucose reference range applies only to samples taken after fasting for at least 8 hours.   BUN 8 8 - 23 mg/dL   Creatinine, Ser 0.81 0.44 - 1.00 mg/dL   Calcium 8.2 (L) 8.9 - 10.3 mg/dL   GFR, Estimated >60 >60 mL/min    Comment: (NOTE) Calculated using the CKD-EPI Creatinine Equation (2021)    Anion gap 12 5 - 15    Comment: Performed at [facility] [address] [place], [place] [address]  CBC     Status: Abnormal   Collection Time: 04/18/22  7:54 AM  Result Value Ref Range   WBC 13.6 (H) 4.0 - 10.5 K/uL   RBC 4.09 3.87 - 5.11 MIL/uL   Hemoglobin 9.8 (L) 12.0 - 15.0 g/dL   HCT 31.5 (L) 36.0 - 46.0 %   MCV 77.0 (L) 80.0 - 100.0 fL   MCH 24.0 (L) 26.0 - 34.0 pg   MCHC 31.1 30.0 - 36.0 g/dL   RDW 19.7 (H) 11.5 - 15.5 %   Platelets 345 150 - 400 K/uL   nRBC 0.0 0.0 - 0.2 %    Comment: Performed at [facility] [address] [place], [place] [address]   DG CHEST PORT 1 VIEW  Result Date: 04/17/2022 CLINICAL DATA:  Lower abdominal pain with nausea and vomiting. EXAM: PORTABLE CHEST 1 VIEW COMPARISON:  May 15, 2015 FINDINGS: The heart size and mediastinal contours are within normal limits. There is moderate severity calcification of the aortic arch. Low lung volumes are noted with mild atelectatic changes seen within the bilateral lung bases. There is no evidence of a pleural effusion or pneumothorax. A radiopaque stent is seen overlying the soft tissues of left neck. The visualized skeletal structures are unremarkable. IMPRESSION: Low lung volumes with mild bibasilar atelectasis. Electronically Signed   By: Virgina Norfolk M.D.   On: 04/17/2022 00:29   CT Abdomen Pelvis W Contrast  Result Date: 04/16/2022 CLINICAL DATA:  Lower abdominal pain EXAM: CT ABDOMEN AND PELVIS WITH CONTRAST TECHNIQUE: Multidetector CT imaging of the abdomen and pelvis was  performed using the standard protocol following bolus administration of intravenous contrast. RADIATION DOSE REDUCTION: This exam was performed according to the departmental dose-optimization program which includes automated exposure control, adjustment of the mA and/or kV according to patient size and/or use of iterative reconstruction technique. CONTRAST:  60m OMNIPAQUE IOHEXOL 350 MG/ML SOLN COMPARISON:  04/05/2021 FINDINGS: Lower chest: No acute abnormality.  Small hiatal hernia. Hepatobiliary: Stable hypodensities within segment 4B and segment 5 likely representing small hepatic cysts. Liver otherwise unremarkable. No intra or extrahepatic biliary ductal dilation. Gallbladder unremarkable. Pancreas: Unremarkable Spleen: Unremarkable Adrenals/Urinary Tract: The adrenal glands are unremarkable. The kidneys are normal. The bladder is unremarkable. Stomach/Bowel: There is circumferential wall thickening involving the mid sigmoid colon with associated hyperemia suspicious for an infiltrative mass in this location. Focal inflammatory changes as can  be seen with inflammatory bowel disease could appear similarly. There is infiltration within the adjacent sigmoid mesentery best seen on axial image # 69/3 suspicious for transmural infiltration. This infiltrative change appears to abut several loops of small bowel within the deep pelvis, best seen on sagittal image # 91/7. Mild proximal sigmoid diverticulosis. No evidence of obstruction. Stomach, small bowel, and large bowel are otherwise unremarkable. Appendix normal. Vascular/Lymphatic: There is pathologic left periaortic lymphadenopathy with an index lymph node measuring 2.1 x 2.5 cm at axial image # 36/3. 6 mm enhancing nodule within the sigmoid mesocolon and axial image # 72/3 likely represents a pathologic mesenteric lymph node or small omental implant. Moderate aortoiliac atherosclerotic calcification. Bilateral common iliac artery stenting has been performed. No  aortic aneurysm. Reproductive: Status post hysterectomy. No adnexal masses. Other: Small fat containing left inguinal hernia. Musculoskeletal: No acute bone abnormality. No lytic or blastic bone lesion. IMPRESSION: 1. Circumferential wall thickening involving the mid sigmoid colon with associated hyperemia suspicious for an infiltrative mass in this location. Associated infiltration within the sigmoid mesocolon suggests transmural infiltration. Associated pathologic adenopathy within the sigmoid mesentery and left periaortic lymph node group. No evidence of obstruction. Correlation with endoscopy is recommended for further evaluation. Aortic Atherosclerosis (ICD10-I70.0). Electronically Signed   By: Fidela Salisbury M.D.   On: 04/16/2022 20:44    Anti-infectives (From admission, onward)    Start     Dose/Rate Route Frequency Ordered Stop   04/16/22 2130  piperacillin-tazobactam (ZOSYN) IVPB 3.375 g        3.375 g 12.5 mL/hr over 240 Minutes Intravenous Every 8 hours 04/16/22 2126         Assessment/Plan Sigmoid Colon Mass - This is a pleasant 72 year old female who presented with progressive lower abdominal pain and changes in bowel habits over the last several months.  CT showed mid sigmoid colon mass with infiltration of the sigmoid mesocolon + associated pathologic adenopathy. Colonoscopy showed partially obstructing sigmoid colon mass involving 2/3 of the lumen circumference but was unable to be traversed with ultraslim colonoscope. Area of distal mass was tattooed. Bx pending. - While she is not clinically obstructed and denies any hematochezia/melena with stable hgb I fear if she would not tolerate po's past liquids to get adequate nutrition at home. Given this, along with CT scan findings, would recommended sigmoid resection while inpatient. - Will get CEA and CT Chest for completeness.  - Leave on CLD and start gentle miralax prep.  - Would ask TRH to medically optimize.  - I discussed with  the patient the anatomy and physiology of the GI tract using pictures/diagrams. The planned procedure of exploratory laparotomy, partial colectomy, possible creation of ostomy and the material risks were discussed with the patient along with her family in the room (her husband and 2 sons). Risks include but are not limited to anesthesia (MI, CVA, prolonged intubation, aspiration, death), pain, bleeding,  infection, scarring, hernia, damage to surrounding structures (blood vessels/nerves/viscus/organs/ureter), ileus, DVT/PE and leak from anastomosis if one is created. We discussed we will likely create an ostomy/stoma during the time of surgery. We also discussed typical post-operative care including the possible need for rehab/snf if necessary. The patient's questions were answered to their satisfaction, they voiced understanding and elected to proceed with surgery.  - Discussed case with MD. Will ask Cardiology to see pre-op.   FEN - Do not advance past CLD, IVF per TRH VTE - SCDs, please hold Brilinta. Okay for Lovenox or subq heparin  ID - None Foley - None  Hx of endometrial cancer s/p robotic assisted TAH/BSO with b/l ureterolysis by Dr. Gwenlyn Found at [facility] 05/2021 (per Oncology/Dr. Federico Flake note she was recommended for adjuvant chemotherapy and XRT but declined) HLD Hx CVA on Plavix (last dose 2/20) OSA not on CPAP Tobacco use - on o2 here PVD/PAD s/p iliac stents Carotid dz s/p L stent by Dr. Estanislado Pandy 2022 with > 75% stenosis in stent at area of native bifurcation and complete occlusion R ICA on most recent US, who presented to the ED for abdominal pain. Saw Dr. Trula Slade in the office prior to [place]. 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 Discussed with vascular surgery who recommended ASA while off Brilinta and f/u US. They suspect there is nothing that will need to be done pre-op.   I  reviewed nursing notes, Consultant (GI) notes, hospitalist notes, last 24 h vitals and pain scores, last 48 h intake and output, last 24 h labs and trends, and last 24 h imaging results. I have spoke with Vascular surgery and Cardiology about the patient as well.   Monica Pugh, [facility] Surgery 04/18/2022, 12:10 PM Please see [place] for pager number during day hours 7:00am-4:30pm

## 2022-04-18 NOTE — Plan of Care (Signed)
  Problem: Education: Goal: Knowledge of General Education information will improve Description: Including pain rating scale, medication(s)/side effects and non-pharmacologic comfort measures Outcome: Progressing   Problem: Clinical Measurements: Goal: Ability to maintain clinical measurements within normal limits will improve Outcome: Progressing   Problem: Nutrition: Goal: Adequate nutrition will be maintained Outcome: Progressing   Problem: Elimination: Goal: Will not experience complications related to bowel motility Outcome: Progressing   Problem: Pain Managment: Goal: General experience of comfort will improve Outcome: Progressing   Problem: Safety: Goal: Ability to remain free from injury will improve Outcome: Progressing

## 2022-04-18 NOTE — Progress Notes (Signed)
PROGRESS NOTE    Monica Pugh  [id number] DOB: 11/11/1950 DOA: 04/16/2022 PCP: Bonnita Nasuti, MD   Brief Narrative: 72 year old with past medical history significant for endometrial cancer, carotid artery occlusion, stroke, hyperlipidemia, anemia, sleep apnea not on CPAP, PVD, GERD, bilateral hearing loss, tobacco use presents complaining of lower abdominal pain, nausea vomiting.  She was found to have leukocytosis white blood cell 14.  CT abdomen and pelvis show circumferential wall thickening involving the mid sigmoid colon with associated hyperemia suspicious for an infiltrative mass in this location.  Associated infiltration within the sigmoid mesocolon suggest transmural infiltration.  Adenopathy within the sigmoid mesentery and left periaortic lymph node groups.  Negative for obstruction.  Patient developed hypoxia in the ED after she received 4 mg of IV morphine. She reports ongoing bilateral lower extremity abdominal pain for few months.  She developed nausea and vomiting for the last 3 days. Admitted for further evaluation of colonic mass   Assessment & Plan:   Principal Problem:   Colonic mass Active Problems:   GERD (gastroesophageal reflux disease)   Hyperlipidemia   PVD (peripheral vascular disease) (HCC)   Nausea and vomiting   Hypokalemia   History of stroke   Hypoxemia  1-Mid sigmoid colon: Nausea vomiting abdominal pain: -CT showed circumferential wall thickening involving the mid sigmoid colon with associated hyperemia suspicious for an infiltrative mass.  Positive for adenopathy. -Patient had mild leukocytosis, she was a started on IV antibiotics. -Has been consulted -Continue with IV fluids -As needed Zofran -GI  consulted, underwent colonoscopy 2/26: Partially obstructing tumor in the sigmoid colon.  Not able to bypassed with ultraslim colonoscope.  Nonbleeding internal hemorrhoids. Surgery consulted.  Hypokalemia: Replace Hypomagnesemia: Replace  Acute  hypoxemia: In the setting of opioid Wean off of oxygen as tolerated. Will reduce morphine dose to 0.5 mg every 4 hours as needed.  Monitor for any fever or hypoxemia Chest x-ray:Low lung volumes with mild bibasilar atelectasis.    History of stroke, PVD, hyperlipidemia; Continue with statin.  Hold aspirin and Plavix in anticipation of biopsy tomorrow  GERD;  PPI  Iron deficiency anemia: Monitor hemoglobin She will need iron supplement  HTN; PRN hydralazine  Estimated body mass index is 32.51 kg/m as calculated from the following:   Height as of this encounter: '5\' 3"'$  (1.6 m).   Weight as of this encounter: 83.2 kg.   DVT prophylaxis: SCD Code Status: DNR Family Communication: Care discussed with patient.  Disposition Plan:  Status is: Observation The patient remains OBS appropriate and will d/c before 2 midnights.    Consultants:  GI  Procedures:  None  Antimicrobials:    Subjective: She reported abdominal cramping pain, not because she has been having multiple bowel movement after taking the prep  Objective: Vitals:   04/17/22 2009 04/17/22 2146 04/18/22 0010 04/18/22 0445  BP: (!) 116/49  (!) 155/68 (!) 145/55  Pulse: 75  83 78  Resp: 20  20 (!) 21  Temp: 98.4 F (36.9 C)  99.3 F (37.4 C) 98.5 F (36.9 C)  TempSrc: Oral  Oral Oral  SpO2: 92% 98% 98% 97%  Weight:    83.2 kg  Height:       No intake or output data in the 24 hours ending 04/18/22 0743  Filed Weights   04/16/22 1728 04/17/22 1655 04/18/22 0445  Weight: 82.6 kg 83.3 kg 83.2 kg    Examination:  General exam: NAD Respiratory system: CTA Cardiovascular system: S 1, S 2 RRR  Gastrointestinal system: BS present, soft, nt Central nervous system: Alert Extremities: no edema   Data Reviewed: I have personally reviewed following labs and imaging studies  CBC: Recent Labs  Lab 04/16/22 1749 04/17/22 0158  WBC 14.7* 13.1*  NEUTROABS 11.1*  --   HGB 11.7* 10.6*  HCT 38.2 33.3*   MCV 76.4* 76.2*  PLT 419* 0000000    Basic Metabolic Panel: Recent Labs  Lab 04/16/22 1749 04/17/22 0158  NA 135 134*  K 2.6* 3.1*  CL 91* 96*  CO2 28 28  GLUCOSE 106* 112*  BUN 12 8  CREATININE 0.87 0.79  CALCIUM 9.4 8.4*  MG  --  1.7    GFR: Estimated Creatinine Clearance: 66.8 mL/min (by C-G formula based on SCr of 0.79 mg/dL). Liver Function Tests: Recent Labs  Lab 04/16/22 1749  AST 14*  ALT 8  ALKPHOS 111  BILITOT 0.6  PROT 6.5  ALBUMIN 3.3*    Recent Labs  Lab 04/16/22 1749  LIPASE 21    No results for input(s): "AMMONIA" in the last 168 hours. Coagulation Profile: No results for input(s): "INR", "PROTIME" in the last 168 hours. Cardiac Enzymes: No results for input(s): "CKTOTAL", "CKMB", "CKMBINDEX", "TROPONINI" in the last 168 hours. BNP (last 3 results) No results for input(s): "PROBNP" in the last 8760 hours. HbA1C: No results for input(s): "HGBA1C" in the last 72 hours. CBG: No results for input(s): "GLUCAP" in the last 168 hours. Lipid Profile: No results for input(s): "CHOL", "HDL", "LDLCALC", "TRIG", "CHOLHDL", "LDLDIRECT" in the last 72 hours. Thyroid Function Tests: No results for input(s): "TSH", "T4TOTAL", "FREET4", "T3FREE", "THYROIDAB" in the last 72 hours. Anemia Panel: Recent Labs    04/17/22 1006  VITAMINB12 1,119*  FOLATE 9.3  FERRITIN 14  TIBC 385  IRON 26*  RETICCTPCT 1.2   Sepsis Labs: No results for input(s): "PROCALCITON", "LATICACIDVEN" in the last 168 hours.  Recent Results (from the past 240 hour(s))  Resp panel by RT-PCR (RSV, Flu A&B, Covid) Anterior Nasal Swab     Status: None   Collection Time: 04/16/22  5:52 PM   Specimen: Anterior Nasal Swab  Result Value Ref Range Status   SARS Coronavirus 2 by RT PCR NEGATIVE NEGATIVE Final   Influenza A by PCR NEGATIVE NEGATIVE Final   Influenza B by PCR NEGATIVE NEGATIVE Final    Comment: (NOTE) The Xpert Xpress SARS-CoV-2/FLU/RSV plus assay is intended as an  aid in the diagnosis of influenza from Nasopharyngeal swab specimens and should not be used as a sole basis for treatment. Nasal washings and aspirates are unacceptable for Xpert Xpress SARS-CoV-2/FLU/RSV testing.  Fact Sheet for Patients: EntrepreneurPulse.com.au  Fact Sheet for Healthcare Providers: IncredibleEmployment.be  This test is not yet approved or cleared by the [place] FDA and has been authorized for detection and/or diagnosis of SARS-CoV-2 by FDA under an Emergency Use Authorization (EUA). This EUA will remain in effect (meaning this test can be used) for the duration of the COVID-19 declaration under Section 564(b)(1) of the Act, 21 U.S.C. section 360bbb-3(b)(1), unless the authorization is terminated or revoked.     Resp Syncytial Virus by PCR NEGATIVE NEGATIVE Final    Comment: (NOTE) Fact Sheet for Patients: EntrepreneurPulse.com.au  Fact Sheet for Healthcare Providers: IncredibleEmployment.be  This test is not yet approved or cleared by the [place] FDA and has been authorized for detection and/or diagnosis of SARS-CoV-2 by FDA under an Emergency Use Authorization (EUA). This EUA will remain in effect (meaning this test can  be used) for the duration of the COVID-19 declaration under Section 564(b)(1) of the Act, 21 U.S.C. section 360bbb-3(b)(1), unless the authorization is terminated or revoked.  Performed at [facility], [place] [address], [place], [place] [address]          Radiology Studies: DG CHEST PORT 1 VIEW  Result Date: 04/17/2022 CLINICAL DATA:  Lower abdominal pain with nausea and vomiting. EXAM: PORTABLE CHEST 1 VIEW COMPARISON:  May 15, 2015 FINDINGS: The heart size and mediastinal contours are within normal limits. There is moderate severity calcification of the aortic arch. Low lung volumes are noted with mild atelectatic changes seen within the  bilateral lung bases. There is no evidence of a pleural effusion or pneumothorax. A radiopaque stent is seen overlying the soft tissues of left neck. The visualized skeletal structures are unremarkable. IMPRESSION: Low lung volumes with mild bibasilar atelectasis. Electronically Signed   By: Virgina Norfolk M.D.   On: 04/17/2022 00:29   CT Abdomen Pelvis W Contrast  Result Date: 04/16/2022 CLINICAL DATA:  Lower abdominal pain EXAM: CT ABDOMEN AND PELVIS WITH CONTRAST TECHNIQUE: Multidetector CT imaging of the abdomen and pelvis was performed using the standard protocol following bolus administration of intravenous contrast. RADIATION DOSE REDUCTION: This exam was performed according to the departmental dose-optimization program which includes automated exposure control, adjustment of the mA and/or kV according to patient size and/or use of iterative reconstruction technique. CONTRAST:  38m OMNIPAQUE IOHEXOL 350 MG/ML SOLN COMPARISON:  04/05/2021 FINDINGS: Lower chest: No acute abnormality.  Small hiatal hernia. Hepatobiliary: Stable hypodensities within segment 4B and segment 5 likely representing small hepatic cysts. Liver otherwise unremarkable. No intra or extrahepatic biliary ductal dilation. Gallbladder unremarkable. Pancreas: Unremarkable Spleen: Unremarkable Adrenals/Urinary Tract: The adrenal glands are unremarkable. The kidneys are normal. The bladder is unremarkable. Stomach/Bowel: There is circumferential wall thickening involving the mid sigmoid colon with associated hyperemia suspicious for an infiltrative mass in this location. Focal inflammatory changes as can be seen with inflammatory bowel disease could appear similarly. There is infiltration within the adjacent sigmoid mesentery best seen on axial image # 69/3 suspicious for transmural infiltration. This infiltrative change appears to abut several loops of small bowel within the deep pelvis, best seen on sagittal image # 91/7. Mild proximal  sigmoid diverticulosis. No evidence of obstruction. Stomach, small bowel, and large bowel are otherwise unremarkable. Appendix normal. Vascular/Lymphatic: There is pathologic left periaortic lymphadenopathy with an index lymph node measuring 2.1 x 2.5 cm at axial image # 36/3. 6 mm enhancing nodule within the sigmoid mesocolon and axial image # 72/3 likely represents a pathologic mesenteric lymph node or small omental implant. Moderate aortoiliac atherosclerotic calcification. Bilateral common iliac artery stenting has been performed. No aortic aneurysm. Reproductive: Status post hysterectomy. No adnexal masses. Other: Small fat containing left inguinal hernia. Musculoskeletal: No acute bone abnormality. No lytic or blastic bone lesion. IMPRESSION: 1. Circumferential wall thickening involving the mid sigmoid colon with associated hyperemia suspicious for an infiltrative mass in this location. Associated infiltration within the sigmoid mesocolon suggests transmural infiltration. Associated pathologic adenopathy within the sigmoid mesentery and left periaortic lymph node group. No evidence of obstruction. Correlation with endoscopy is recommended for further evaluation. Aortic Atherosclerosis (ICD10-I70.0). Electronically Signed   By: AFidela SalisburyM.D.   On: 04/16/2022 20:44        Scheduled Meds:  atorvastatin  40 mg Oral q AM   cholecalciferol  5,000 Units Oral Daily   ferrous sulfate  325 mg Oral Q  breakfast   guaiFENesin  600 mg Oral BID   ipratropium-albuterol  3 mL Nebulization QID   pantoprazole (PROTONIX) IV  40 mg Intravenous Q12H   Continuous Infusions:  sodium chloride     piperacillin-tazobactam (ZOSYN)  IV 3.375 g (04/18/22 0531)     LOS: 1 day    Time spent: 35 minutes    Dereka Lueras A Mikaia Janvier, MD [facility]   If 7PM-7AM, please contact night-coverage [email or web address]  04/18/2022, 7:43 AM

## 2022-04-18 NOTE — Evaluation (Signed)
Physical Therapy Evaluation Patient Details Name: Tran Farish MRN: [id number] DOB: Mar 21, 1950 Today's Date: 04/18/2022  History of Present Illness  72 y.o. female presents to ED 2/24 with lower abdominal pain, nausea, and vomiting. CT suggestive of suspected infiltrative mass of the mid sigmoid colon. PMH: endometrial cancer, carotid artery occlusion, stroke, hyperlipidemia, anemia, sleep apnea not on CPAP, PVD, GERD, bilateral hearing loss, tobacco use.  Clinical Impression  Patient evaluated by Physical Therapy with no further acute PT needs identified. All education has been completed and the patient has no further questions. Pt has no follow-up Physical Therapy or equipment needs. Family can assist as needed. PT is signing off but will refer to Mobility Specialist to continue hallway ambulation. Thank you for this referral.        Recommendations for follow up therapy are one component of a multi-disciplinary discharge planning process, led by the attending physician.  Recommendations may be updated based on patient status, additional functional criteria and insurance authorization.  Follow Up Recommendations No PT follow up      Assistance Recommended at Discharge Set up Supervision/Assistance  Patient can return home with the following  Assist for transportation    Equipment Recommendations None recommended by PT     Functional Status Assessment Patient has not had a recent decline in their functional status     Precautions / Restrictions Precautions Precautions: None Restrictions Weight Bearing Restrictions: No      Mobility  Bed Mobility Overal bed mobility: Modified Independent             General bed mobility comments: HoB elevated and minor use of handrails    Transfers Overall transfer level: Independent                 General transfer comment: good power up and self steadying    Ambulation/Gait Ambulation/Gait assistance: Modified independent  (Device/Increase time) Gait Distance (Feet): 150 Feet Assistive device: None Gait Pattern/deviations: WFL(Within Functional Limits), Step-through pattern Gait velocity: WFL Gait velocity interpretation: >2.62 ft/sec, indicative of community ambulatory   General Gait Details: mod I for management of IV pole and O2 tank, pt with strong, steady ambulation      Balance Overall balance assessment: Independent                                           Pertinent Vitals/Pain Pain Assessment Pain Assessment: No/denies pain (given morphine immediately prior to PT session)    Home Living Family/patient expects to be discharged to:: Private residence Living Arrangements: Spouse/significant other Available Help at Discharge: Available 24 hours/day;Family Type of Home: House Home Access: Level entry       Home Layout: One level Home Equipment: Grab bars - tub/shower;Hand held shower head;Other (comment) (uses step stool in tub to sit on)      Prior Function Prior Level of Function : Independent/Modified Independent                     Hand Dominance   Dominant Hand: Right    Extremity/Trunk Assessment   Upper Extremity Assessment Upper Extremity Assessment: Overall WFL for tasks assessed    Lower Extremity Assessment Lower Extremity Assessment: Overall WFL for tasks assessed    Cervical / Trunk Assessment Cervical / Trunk Assessment: Kyphotic  Communication   Communication: HOH  Cognition Arousal/Alertness: Awake/alert Behavior During Therapy: Flat affect Overall Cognitive  Status: Within Functional Limits for tasks assessed                                 General Comments: very flat during session limited communication.        General Comments General comments (skin integrity, edema, etc.): Pt on 2L O2 via [place] on entry SpO2 97%O2, removed [place] and SpO2 dropped to 83%O2. Replaced [place] and 2L O2 and pt able to maintain SpO2 >93%O2  with ambulation. HR in 80s with ambulation BP in recliner after ambulation 132/58        Assessment/Plan    PT Assessment Patient does not need any further PT services         PT Goals (Current goals can be found in the Care Plan section)  Acute Rehab PT Goals Patient Stated Goal: go home PT Goal Formulation: All assessment and education complete, DC therapy     AM-PAC PT "6 Clicks" Mobility  Outcome Measure Help needed turning from your back to your side while in a flat bed without using bedrails?: None Help needed moving from lying on your back to sitting on the side of a flat bed without using bedrails?: None Help needed moving to and from a bed to a chair (including a wheelchair)?: None Help needed standing up from a chair using your arms (e.g., wheelchair or bedside chair)?: None Help needed to walk in hospital room?: None Help needed climbing 3-5 steps with a railing? : A Little 6 Click Score: 23    End of Session Equipment Utilized During Treatment: Gait belt;Oxygen Activity Tolerance: Patient tolerated treatment well Patient left: in chair;with call bell/phone within reach;with family/visitor present Nurse Communication: Mobility status (IV infusion complete) PT Visit Diagnosis: Muscle weakness (generalized) (M62.81)    Time: [id number] PT Time Calculation (min) (ACUTE ONLY): 35 min   Charges:   PT Evaluation $PT Eval Moderate Complexity: 1 Mod PT Treatments $Therapeutic Exercise: 8-22 mins        Jacelyn Cuen B. Migdalia Dk PT, DPT Acute Rehabilitation Services Please use secure chat or  Call Office [phone]   [place] 04/18/2022, 10:08 AM

## 2022-04-18 NOTE — Anesthesia Postprocedure Evaluation (Signed)
Anesthesia Post Note  Patient: Monica Pugh  Procedure(s) Performed: COLONOSCOPY WITH PROPOFOL BIOPSY SUBMUCOSAL TATTOO INJECTION     Patient location during evaluation: PACU Anesthesia Type: MAC Level of consciousness: awake and alert Pain management: pain level controlled Vital Signs Assessment: post-procedure vital signs reviewed and stable Respiratory status: spontaneous breathing, nonlabored ventilation and respiratory function stable Cardiovascular status: stable and blood pressure returned to baseline Anesthetic complications: no   No notable events documented.  Last Vitals:  Vitals:   04/18/22 1125 04/18/22 1140  BP: (!) 113/53 117/72  Pulse: 96 68  Resp: 16 (!) 24  Temp: 36.9 C 36.9 C  SpO2: 96% 96%    Last Pain:  Vitals:   04/18/22 1125  TempSrc:   PainSc: 0-No pain                 Audry Pili

## 2022-04-18 NOTE — Op Note (Signed)
[facility] Patient Name: Monica Pugh Procedure Date : 04/18/2022 MRN: [id number] Attending MD: Georgian Co , , [id number] Date of Birth: [birth year] CSN: [id number] Age: 72 Admit Type: Inpatient Procedure:                Colonoscopy Indications:              Abnormal CT of the GI tract Providers:                Adline Mango" Leonie Douglas, RN, William Dalton, Technician Referring MD:             Hospitalist team Medicines:                Monitored Anesthesia Care Complications:            No immediate complications. Estimated Blood Loss:     Estimated blood loss was minimal. Procedure:                Pre-Anesthesia Assessment:                           - Prior to the procedure, a History and Physical                            was performed, and patient medications and                            allergies were reviewed. The patient's tolerance of                            previous anesthesia was also reviewed. The risks                            and benefits of the procedure and the sedation                            options and risks were discussed with the patient.                            All questions were answered, and informed consent                            was obtained. Prior Anticoagulants: The patient has                            taken Plavix (clopidogrel), last dose was 5 days                            prior to procedure. ASA Grade Assessment: III - A                            patient with severe systemic disease. After  reviewing the risks and benefits, the patient was                            deemed in satisfactory condition to undergo the                            procedure.                           After obtaining informed consent, the colonoscope                            was passed under direct vision. Throughout the                            procedure, the patient's  blood pressure, pulse, and                            oxygen saturations were monitored continuously. The                            [id number] Olympus colonoscope was                            introduced through the anus and advanced to the the                            sigmoid colon to examine a mass. This was the                            intended extent. The colonoscopy was performed                            without difficulty. The patient tolerated the                            procedure well. The quality of the bowel                            preparation was fair. The rectum was photographed. Scope In: 10:50:55 AM Scope Out: 11:17:28 AM Total Procedure Duration: 0 hours 26 minutes 33 seconds  Findings:      A frond-like/villous and polypoid partially obstructing large mass was       found in the sigmoid colon. The mass was partially circumferential       (involving two-thirds of the lumen circumference). The mass measured       five cm in length. Despite switching to a ultraslim colonoscope, this       mass was not able to be bypassed. Biopsies were taken with a cold       forceps for histology. Area distal to the mass was tattooed with an       injection of Spot (carbon black).      Non-bleeding internal hemorrhoids were found during retroflexion. Impression:               - Preparation  of the colon was fair.                           - Partially obstructing tumor in the sigmoid colon.                            Not able to be bypassed with ultraslim colonoscope.                            Biopsied. Tattooed.                           - Non-bleeding internal hemorrhoids. Recommendation:           - Return patient to hospital ward for ongoing care.                           - Await pathology results.                           - Patient is at risk for colonic obstruction in the                            future. Would recommend colorectal surgery consult.                            - Okay to restart Plavix tomorrow from a GI                            perspective but may be helpful to get surgery's                            input as well before restarting anticoagulation.                           - The findings and recommendations were discussed                            with the patient. Procedure Code(s):        --- Professional ---                           [phone], 33, Colonoscopy, flexible; with biopsy,                            single or multiple                           [id number], 52, Colonoscopy, flexible; with directed                            submucosal injection(s), any substance Diagnosis Code(s):        --- Professional ---                           K64.8, Other hemorrhoids  D49.0, Neoplasm of unspecified behavior of                            digestive system                           K56.690, Other partial intestinal obstruction                           R93.3, Abnormal findings on diagnostic imaging of                            other parts of digestive tract CPT copyright 2022 [employer]. All rights reserved. The codes documented in this report are preliminary and upon coder review may  be revised to meet current compliance requirements. Dr Georgian Co "Lyndee Leo" Maynard,  04/18/2022 11:38:26 AM Number of Addenda: 0

## 2022-04-18 NOTE — Transfer of Care (Signed)
Immediate Anesthesia Transfer of Care Note  Patient: Monica Pugh  Procedure(s) Performed: COLONOSCOPY WITH PROPOFOL BIOPSY SUBMUCOSAL TATTOO INJECTION  Patient Location: PACU  Anesthesia Type:MAC  Level of Consciousness: awake, alert , and oriented  Airway & Oxygen Therapy: Patient Spontanous Breathing and Patient connected to nasal cannula oxygen  Post-op Assessment: Report given to RN and Post -op Vital signs reviewed and stable  Post vital signs: Reviewed and stable  Last Vitals:  Vitals Value Taken Time  BP 113/53 04/18/22 1123  Temp    Pulse 68 04/18/22 1126  Resp 21 04/18/22 1126  SpO2 90 % 04/18/22 1126  Vitals shown include unvalidated device data.  Last Pain:  Vitals:   04/18/22 1031  TempSrc: Temporal  PainSc: 0-No pain      Patients Stated Pain Goal: 0 [phone])  Complications: No notable events documented.

## 2022-04-18 NOTE — Interval H&P Note (Signed)
History and Physical Interval Note:  04/18/2022 10:37 AM  Monica Pugh  has presented today for surgery, with the diagnosis of Abnormal CT with sigmoid colon mass.  The various methods of treatment have been discussed with the patient and family. After consideration of risks, benefits and other options for treatment, the patient has consented to  Procedure(s): COLONOSCOPY WITH PROPOFOL (N/A) as a surgical intervention.  The patient's history has been reviewed, patient examined, no change in status, stable for surgery.  I have reviewed the patient's chart and labs.  Questions were answered to the patient's satisfaction.     Sharyn Creamer

## 2022-04-18 NOTE — Telephone Encounter (Signed)
04/18/22 Daughter called and cancelled appts.Patient is in the [facility] [place]

## 2022-04-18 NOTE — TOC Progression Note (Signed)
Transition of Care [facility]) - Progression Note    Patient Details  Name: Monica Pugh MRN: [id number] Date of Birth: Jun 23, 1950  [facility]) CM/SW Contact  Zenon Mayo, RN Phone Number: 04/18/2022, 1:25 PM  Clinical Narrative:    from home with spouse,indep  for colonoscopy today, ct scan showed mass, may need home oxygen at dc.  TOC following.        Expected Discharge Plan and Services                                               Social Determinants of Health (SDOH) Interventions SDOH Screenings   Food Insecurity: No Food Insecurity (04/17/2022)  Housing: Low Risk  (04/17/2022)  Transportation Needs: No Transportation Needs (04/17/2022)  Utilities: Not At Risk (04/17/2022)  Depression (PHQ2-9): Low Risk  (04/06/2022)  Tobacco Use: High Risk (04/18/2022)    Readmission Risk Interventions     No data to display

## 2022-04-18 NOTE — Anesthesia Procedure Notes (Signed)
Procedure Name: MAC Date/Time: 04/18/2022 10:45 AM  Performed by: Griffin Dakin, CRNAPre-anesthesia Checklist: Patient identified, Emergency Drugs available, Suction available, Patient being monitored and Timeout performed Patient Re-evaluated:Patient Re-evaluated prior to induction Oxygen Delivery Method: Nasal cannula Induction Type: IV induction Placement Confirmation: positive ETCO2 and breath sounds checked- equal and bilateral Dental Injury: Teeth and Oropharynx as per pre-operative assessment  Comments: optiflow

## 2022-04-19 ENCOUNTER — Inpatient Hospital Stay (HOSPITAL_COMMUNITY): Payer: Medicare HMO

## 2022-04-19 ENCOUNTER — Encounter (HOSPITAL_COMMUNITY): Payer: Self-pay | Admitting: Internal Medicine

## 2022-04-19 ENCOUNTER — Encounter: Payer: Self-pay | Admitting: Oncology

## 2022-04-19 ENCOUNTER — Encounter (HOSPITAL_COMMUNITY)
Admission: EM | Disposition: A | Discharge status: Hospice | Payer: Self-pay | Source: Home / Self Care | Attending: Internal Medicine

## 2022-04-19 DIAGNOSIS — R194 Change in bowel habit: Secondary | ICD-10-CM | POA: Diagnosis not present

## 2022-04-19 DIAGNOSIS — R0609 Other forms of dyspnea: Secondary | ICD-10-CM | POA: Diagnosis not present

## 2022-04-19 DIAGNOSIS — I6529 Occlusion and stenosis of unspecified carotid artery: Secondary | ICD-10-CM

## 2022-04-19 DIAGNOSIS — G4733 Obstructive sleep apnea (adult) (pediatric): Secondary | ICD-10-CM

## 2022-04-19 DIAGNOSIS — C187 Malignant neoplasm of sigmoid colon: Secondary | ICD-10-CM | POA: Diagnosis not present

## 2022-04-19 DIAGNOSIS — K5669 Other partial intestinal obstruction: Secondary | ICD-10-CM | POA: Diagnosis not present

## 2022-04-19 DIAGNOSIS — Z8542 Personal history of malignant neoplasm of other parts of uterus: Secondary | ICD-10-CM

## 2022-04-19 DIAGNOSIS — K648 Other hemorrhoids: Secondary | ICD-10-CM | POA: Diagnosis not present

## 2022-04-19 DIAGNOSIS — I219 Acute myocardial infarction, unspecified: Secondary | ICD-10-CM

## 2022-04-19 DIAGNOSIS — K6389 Other specified diseases of intestine: Secondary | ICD-10-CM | POA: Diagnosis not present

## 2022-04-19 DIAGNOSIS — I639 Cerebral infarction, unspecified: Secondary | ICD-10-CM

## 2022-04-19 LAB — ECHOCARDIOGRAM COMPLETE
AR max vel: 1.81 cm2
AV Area VTI: 1.8 cm2
AV Area mean vel: 1.76 cm2
AV Mean grad: 5 mmHg
AV Peak grad: 10.1 mmHg
Ao pk vel: 1.59 m/s
Area-P 1/2: 2.87 cm2
Height: 63 in
S' Lateral: 2.9 cm
Weight: 2963.2 oz

## 2022-04-19 LAB — BASIC METABOLIC PANEL
Anion gap: 9 (ref 5–15)
BUN: 9 mg/dL (ref 8–23)
CO2: 25 mmol/L (ref 22–32)
Calcium: 8.3 mg/dL — ABNORMAL LOW (ref 8.9–10.3)
Chloride: 99 mmol/L (ref 98–111)
Creatinine, Ser: 0.76 mg/dL (ref 0.44–1.00)
GFR, Estimated: >60 mL/min (ref >60)
Glucose, Bld: 104 mg/dL — ABNORMAL HIGH (ref 70–99)
Potassium: 3.5 mmol/L (ref 3.5–5.1)
Sodium: 133 mmol/L — ABNORMAL LOW (ref 135–145)

## 2022-04-19 LAB — CBC
HCT: 29.1 % — ABNORMAL LOW (ref 36.0–46.0)
Hemoglobin: 9.3 g/dL — ABNORMAL LOW (ref 12.0–15.0)
MCH: 24.3 pg — ABNORMAL LOW (ref 26.0–34.0)
MCHC: 32 g/dL (ref 30.0–36.0)
MCV: 76.2 fL — ABNORMAL LOW (ref 80.0–100.0)
Platelets: 321 10*3/uL (ref 150–400)
RBC: 3.82 MIL/uL — ABNORMAL LOW (ref 3.87–5.11)
RDW: 19.6 % — ABNORMAL HIGH (ref 11.5–15.5)
WBC: 13 10*3/uL — ABNORMAL HIGH (ref 4.0–10.5)
nRBC: 0 % (ref 0.0–0.2)

## 2022-04-19 LAB — PROTIME-INR
INR: 1.1 (ref 0.8–1.2)
Prothrombin Time: 13.6 seconds (ref 11.4–15.2)

## 2022-04-19 LAB — MAGNESIUM: Magnesium: 1.7 mg/dL (ref 1.7–2.4)

## 2022-04-19 LAB — PHOSPHORUS: Phosphorus: 3.2 mg/dL (ref 2.5–4.6)

## 2022-04-19 LAB — TYPE AND SCREEN
ABO/RH(D): O POS
Antibody Screen: NEGATIVE

## 2022-04-19 LAB — CEA: CEA: 2.3 ng/mL (ref 0.0–4.7)

## 2022-04-19 SURGERY — COLECTOMY, PARTIAL, ROBOT-ASSISTED, LAPAROSCOPIC
Anesthesia: General

## 2022-04-19 MED ORDER — GUAIFENESIN ER 600 MG PO TB12
1200.0000 mg | ORAL_TABLET | Freq: Two times a day (BID) | ORAL | Status: DC
  Administered 2022-04-19 – 2022-04-25 (×13): 1200 mg via ORAL
  Filled 2022-04-19 (×13): qty 2

## 2022-04-19 MED ORDER — MAGNESIUM SULFATE 2 GM/50ML IV SOLN
2.0000 g | Freq: Once | INTRAVENOUS | Status: AC
  Administered 2022-04-19: 2 g via INTRAVENOUS
  Filled 2022-04-19: qty 50

## 2022-04-19 MED ORDER — IPRATROPIUM-ALBUTEROL 0.5-2.5 (3) MG/3ML IN SOLN
3.0000 mL | Freq: Two times a day (BID) | RESPIRATORY_TRACT | Status: DC
  Administered 2022-04-19 – 2022-04-25 (×13): 3 mL via RESPIRATORY_TRACT
  Filled 2022-04-19 (×13): qty 3

## 2022-04-19 NOTE — Progress Notes (Signed)
PROGRESS NOTE    Monica Pugh  [id number] DOB: 07/12/1950 DOA: 04/16/2022 PCP: Bonnita Nasuti, MD   Brief Narrative: 72 year old with past medical history significant for endometrial cancer, carotid artery occlusion, stroke, hyperlipidemia, anemia, sleep apnea not on CPAP, PVD, GERD, bilateral hearing loss, tobacco use presents complaining of lower abdominal pain, nausea vomiting.  She was found to have leukocytosis white blood cell 14.  CT abdomen and pelvis show circumferential wall thickening involving the mid sigmoid colon with associated hyperemia suspicious for an infiltrative mass in this location.  Associated infiltration within the sigmoid mesocolon suggest transmural infiltration.  Adenopathy within the sigmoid mesentery and left periaortic lymph node groups.  Negative for obstruction.  Patient developed hypoxia in the ED after she received 4 mg of IV morphine. She reports ongoing bilateral lower extremity abdominal pain for few months.  She developed nausea and vomiting for the last 3 days. Admitted for further evaluation of colonic mass. Underwent colonoscopy showed Partially obstructing tumor in the sigmoid colon.  Not able to bypassed with ultraslim colonoscope. Surgery recommend inpatient sigmoidectomy. Cardiology consulted for pre op evaluation. Found to have mediastinal bulky lymphadenopathy. Oncology has been consulted as well.    Assessment & Plan:   Principal Problem:   Colonic mass Active Problems:   GERD (gastroesophageal reflux disease)   Hyperlipidemia   PVD (peripheral vascular disease) (HCC)   Nausea and vomiting   Hypokalemia   History of stroke   Hypoxemia  1-Sigmoid colon Mass: Nausea vomiting abdominal pain: -CT showed circumferential wall thickening involving the mid sigmoid colon with associated hyperemia suspicious for an infiltrative mass.  Positive for adenopathy. -Patient had mild leukocytosis, she was a started on IV antibiotics. -Continue with IV  fluids -As needed Zofran -GI  consulted, underwent colonoscopy 2/26: Partially obstructing tumor in the sigmoid colon.  Not able to bypassed with ultraslim colonoscope.  Nonbleeding internal hemorrhoids. -Surgery consulted. Surgery planning sigmoid resection.  -Awaiting ECHO and carotid doppler for cardiac Clearance.  -CT chest with Bulky Lymphadenopathy and Lung Nodule right lower lobe, left lower lobe, Dr Chryl Heck consulted.  Pathology consistent with adenocarcinoma.   Hypokalemia: Replaced Hypomagnesemia: Replace IV  Acute hypoxemia: In the setting of opioid Wean off of oxygen as tolerated. Reduce morphine dose to 0.5 mg every 4 hours as needed.  Monitor for any fever or hypoxemia Chest x-ray:Low lung volumes with mild bibasilar atelectasis.    History of stroke, PVD, hyperlipidemia; Continue with statin.  Hold aspirin and Plavix in anticipation of biopsy tomorrow  GERD;  PPI  Iron deficiency anemia: Monitor hemoglobin On iron supplement.   HTN; PRN hydralazine  Estimated body mass index is 32.81 kg/m as calculated from the following:   Height as of this encounter: '5\' 3"'$  (1.6 m).   Weight as of this encounter: 84 kg.   DVT prophylaxis: SCD Code Status: DNR Family Communication: Care discussed with patient.  Disposition Plan:  Status is: Observation The patient remains OBS appropriate and will d/c before 2 midnights.    Consultants:  GI  Procedures:  None  Antimicrobials:    Subjective: She report abdominal pain. Tolerating clear liquids    Objective: Vitals:   04/18/22 1923 04/19/22 0006 04/19/22 0434 04/19/22 0435  BP: (!) 128/54 (!) 126/53  (!) 161/55  Pulse: 75 80  81  Resp: '18 20  20  '$ Temp: 99.1 F (37.3 C) 98.9 F (37.2 C)  97.8 F (36.6 C)  TempSrc: Oral Oral  Oral  SpO2: 94% 93%  91%  Weight:   84 kg   Height:        Intake/Output Summary (Last 24 hours) at 04/19/2022 0846 Last data filed at 04/19/2022 0436 Gross per 24 hour  Intake  248.42 ml  Output 1 ml  Net 247.42 ml    Filed Weights   04/17/22 1655 04/18/22 0445 04/19/22 0434  Weight: 83.3 kg 83.2 kg 84 kg    Examination:  General exam: NAD Respiratory system: CTA Cardiovascular system: S 1, S 2 RRR Gastrointestinal system: BS present, soft nt Central nervous system: Alert, follows command Extremities: No edema   Data Reviewed: I have personally reviewed following labs and imaging studies  CBC: Recent Labs  Lab 04/16/22 1749 04/17/22 0158 04/18/22 0754 04/19/22 0657  WBC 14.7* 13.1* 13.6* 13.0*  NEUTROABS 11.1*  --   --   --   HGB 11.7* 10.6* 9.8* 9.3*  HCT 38.2 33.3* 31.5* 29.1*  MCV 76.4* 76.2* 77.0* 76.2*  PLT 419* 353 345 [id number]    Basic Metabolic Panel: Recent Labs  Lab 04/16/22 1749 04/17/22 0158 04/18/22 0754 04/19/22 0657  NA 135 134* 137 133*  K 2.6* 3.1* 3.7 3.5  CL 91* 96* 100 99  CO2 '28 28 25 25  '$ GLUCOSE 106* 112* 94 104*  BUN '12 8 8 9  '$ CREATININE 0.87 0.79 0.81 0.76  CALCIUM 9.4 8.4* 8.2* 8.3*  MG  --  1.7  --  1.7  PHOS  --   --   --  3.2    GFR: Estimated Creatinine Clearance: 67.1 mL/min (by C-G formula based on SCr of 0.76 mg/dL). Liver Function Tests: Recent Labs  Lab 04/16/22 1749  AST 14*  ALT 8  ALKPHOS 111  BILITOT 0.6  PROT 6.5  ALBUMIN 3.3*    Recent Labs  Lab 04/16/22 1749  LIPASE 21    No results for input(s): "AMMONIA" in the last 168 hours. Coagulation Profile: Recent Labs  Lab 04/19/22 0657  INR 1.1   Cardiac Enzymes: No results for input(s): "CKTOTAL", "CKMB", "CKMBINDEX", "TROPONINI" in the last 168 hours. BNP (last 3 results) No results for input(s): "PROBNP" in the last 8760 hours. HbA1C: No results for input(s): "HGBA1C" in the last 72 hours. CBG: No results for input(s): "GLUCAP" in the last 168 hours. Lipid Profile: No results for input(s): "CHOL", "HDL", "LDLCALC", "TRIG", "CHOLHDL", "LDLDIRECT" in the last 72 hours. Thyroid Function Tests: No results for input(s):  "TSH", "T4TOTAL", "FREET4", "T3FREE", "THYROIDAB" in the last 72 hours. Anemia Panel: Recent Labs    04/17/22 1006  VITAMINB12 1,119*  FOLATE 9.3  FERRITIN 14  TIBC 385  IRON 26*  RETICCTPCT 1.2    Sepsis Labs: No results for input(s): "PROCALCITON", "LATICACIDVEN" in the last 168 hours.  Recent Results (from the past 240 hour(s))  Resp panel by RT-PCR (RSV, Flu A&B, Covid) Anterior Nasal Swab     Status: None   Collection Time: 04/16/22  5:52 PM   Specimen: Anterior Nasal Swab  Result Value Ref Range Status   SARS Coronavirus 2 by RT PCR NEGATIVE NEGATIVE Final   Influenza A by PCR NEGATIVE NEGATIVE Final   Influenza B by PCR NEGATIVE NEGATIVE Final    Comment: (NOTE) The Xpert Xpress SARS-CoV-2/FLU/RSV plus assay is intended as an aid in the diagnosis of influenza from Nasopharyngeal swab specimens and should not be used as a sole basis for treatment. Nasal washings and aspirates are unacceptable for Xpert Xpress SARS-CoV-2/FLU/RSV testing.  Fact Sheet for Patients: EntrepreneurPulse.com.au  Fact Sheet  for Healthcare Providers: IncredibleEmployment.be  This test is not yet approved or cleared by the [place] and has been authorized for detection and/or diagnosis of SARS-CoV-2 by FDA under an Emergency Use Authorization (EUA). This EUA will remain in effect (meaning this test can be used) for the duration of the COVID-19 declaration under Section 564(b)(1) of the Act, 21 U.S.C. section 360bbb-3(b)(1), unless the authorization is terminated or revoked.     Resp Syncytial Virus by PCR NEGATIVE NEGATIVE Final    Comment: (NOTE) Fact Sheet for Patients: EntrepreneurPulse.com.au  Fact Sheet for Healthcare Providers: IncredibleEmployment.be  This test is not yet approved or cleared by the [place] FDA and has been authorized for detection and/or diagnosis of SARS-CoV-2 by FDA under  an Emergency Use Authorization (EUA). This EUA will remain in effect (meaning this test can be used) for the duration of the COVID-19 declaration under Section 564(b)(1) of the Act, 21 U.S.C. section 360bbb-3(b)(1), unless the authorization is terminated or revoked.  Performed at [facility], [place] [address], [place], [place] [address]          Radiology Studies: No results found.      Scheduled Meds:  aspirin  81 mg Oral Daily   atorvastatin  40 mg Oral q AM   cholecalciferol  5,000 Units Oral Daily   feeding supplement  1 Container Oral TID BM   ferrous sulfate  325 mg Oral Q breakfast   guaiFENesin  1,200 mg Oral BID   ipratropium-albuterol  3 mL Nebulization BID   pantoprazole (PROTONIX) IV  40 mg Intravenous Q12H   polyethylene glycol  17 g Oral Daily   potassium chloride  40 mEq Oral Daily   Continuous Infusions:  piperacillin-tazobactam (ZOSYN)  IV 3.375 g (04/19/22 0643)     LOS: 2 days    Time spent: 35 minutes    Marik Sedore A Terralyn Matsumura, MD [facility]   If 7PM-7AM, please contact night-coverage [email or web address]  04/19/2022, 8:46 AM

## 2022-04-19 NOTE — Consult Note (Signed)
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  Chief Complaint  Patient presents with   Emesis    HPI:   This is a 72 year old female patient with past medical history significant for coronary artery disease, CVA, endometrial cancer with a TAH and BSO with ureterolysis at [facility] in April 2023 did not receive any adjuvant treatment, known carotid artery disease, obstructive sleep apnea, dyslipidemia, smoker for about 50 to 60 pack years who noticed new onset abdominal pain, nausea, vomiting for the past 4 to 5 days.  Prior to this she had noticed increased constipation and some occasional blood in her stool.  She also has noticed cough for the past 2 to 3 months when she quit smoking.  She underwent imaging for the above-mentioned which showed a mass within the sigmoid colon with associated hyperemia suspicious for an infiltrative mass in this location.  Associated infiltration within the sigmoid mesocolon suggest transmural infiltration.  There is associated pathologic adenopathy within the sigmoid mesentery and left periaortic lymph node group.  CT chest imaging showed bulky mediastinal nodal mets, oblong nodule in the right lower lobe, left lower lobe nodule, findings favor bilateral metastasis.   Oncology was consulted for further recommendations.  Rest of the pertinent 10 point ROS reviewed and negative   Past Medical History:  Diagnosis Date   Anemia    Carotid artery occlusion    GERD (gastroesophageal reflux disease)    Hearing loss    bilateral - no hearing aids   Hyperlipidemia    Intractable nausea and vomiting 04/16/2022   Peripheral vascular disease (HCC)    Sleep apnea    does not use cpap   Stroke ([place])    Vitamin B 12 deficiency    Vitamin D deficiency   :   Past Surgical History:  Procedure Laterality Date   ABDOMINAL HYSTERECTOMY      BIOPSY  04/18/2022   Procedure: BIOPSY;  Surgeon: Sharyn Creamer, MD;  Location: [facility];  Service: Gastroenterology;;   COLONOSCOPY     COLONOSCOPY WITH PROPOFOL N/A 04/18/2022   Procedure: COLONOSCOPY WITH PROPOFOL;  Surgeon: Sharyn Creamer, MD;  Location: [facility] ENDOSCOPY;  Service: Gastroenterology;  Laterality: N/A;   IR ANGIO INTRA EXTRACRAN SEL COM CAROTID INNOMINATE UNI R MOD SED  05/11/2020   IR ANGIO INTRA EXTRACRAN SEL INTERNAL CAROTID UNI L MOD SED  05/11/2020   IR ANGIO INTRA EXTRACRAN SEL INTERNAL CAROTID UNI L MOD SED  06/08/2020   IR ANGIO INTRA EXTRACRAN SEL INTERNAL CAROTID UNI L MOD SED  06/26/2020   IR ANGIO INTRA EXTRACRAN SEL INTERNAL CAROTID UNI L MOD SED  06/09/2021   IR ANGIO VERTEBRAL SEL SUBCLAVIAN INNOMINATE BILAT MOD SED  05/11/2020   IR ANGIOGRAM EXTREMITY BILATERAL  06/08/2020   IR CT HEAD LTD  06/26/2020   IR ILIAC ART STENT INC PTA MOD SED  06/26/2020   IR ILIAC ART STENT INC PTA MOD SED  06/26/2020   IR INTRAVSC STENT CERV CAROTID W/EMB-PROT MOD SED INCL ANGIO  06/26/2020   IR IVUS EACH ADDITIONAL NON CORONARY VESSEL  06/26/2020   IR RADIOLOGIST EVAL & MGMT  05/28/2020   IR RADIOLOGIST EVAL & MGMT  07/15/2020   IR RADIOLOGIST EVAL & MGMT  07/27/2020   IR US GUIDE VASC ACCESS LEFT  06/26/2020   IR US GUIDE VASC ACCESS RIGHT  05/11/2020   IR US  GUIDE VASC ACCESS RIGHT  06/08/2020   IR US GUIDE VASC ACCESS RIGHT  06/26/2020   IR US GUIDE VASC ACCESS RIGHT  06/09/2021   RADIOLOGY WITH ANESTHESIA N/A 05/09/2015   Procedure: RADIOLOGY WITH ANESTHESIA;  Surgeon: Luanne Bras, MD;  Location: [place];  Service: Radiology;  Laterality: N/A;   RADIOLOGY WITH ANESTHESIA N/A 06/08/2020   Procedure: IR WITH ANESTHESIA ANGIOPLASTY;  Surgeon: Luanne Bras, MD;  Location: [place];  Service: Radiology;  Laterality: N/A;   RADIOLOGY WITH ANESTHESIA N/A 06/26/2020   Procedure: REVASCULATION;  Surgeon: Luanne Bras, MD;  Location: [place];  Service: Radiology;   Laterality: N/A;   SUBMUCOSAL TATTOO INJECTION  04/18/2022   Procedure: SUBMUCOSAL TATTOO INJECTION;  Surgeon: Sharyn Creamer, MD;  Location: [facility] ENDOSCOPY;  Service: Gastroenterology;;   TUBAL LIGATION    :   Current Facility-Administered Medications  Medication Dose Route Frequency Provider Last Rate Last Admin   acetaminophen (TYLENOL) tablet 650 mg  650 mg Oral Q6H PRN Shela Leff, MD       Or   acetaminophen (TYLENOL) suppository 650 mg  650 mg Rectal Q6H PRN Shela Leff, MD       aspirin chewable tablet 81 mg  81 mg Oral Daily Jillyn Ledger, PA-C   81 mg at 04/19/22 0930   atorvastatin (LIPITOR) tablet 40 mg  40 mg Oral q AM Regalado, Belkys A, MD   40 mg at 04/19/22 0640   cholecalciferol (VITAMIN D3) 25 MCG (1000 UNIT) tablet 5,000 Units  5,000 Units Oral Daily Regalado, Belkys A, MD   5,000 Units at 04/19/22 0930   feeding supplement (BOOST / RESOURCE BREEZE) liquid 1 Container  1 Container Oral TID BM Saverio Danker, PA-C   1 Container at 04/19/22 1416   ferrous sulfate tablet 325 mg  325 mg Oral Q breakfast Regalado, Belkys A, MD   325 mg at 04/19/22 0640   guaiFENesin (MUCINEX) 12 hr tablet 1,200 mg  1,200 mg Oral BID Regalado, Belkys A, MD   1,200 mg at 04/19/22 0930   hydrALAZINE (APRESOLINE) injection 5 mg  5 mg Intravenous Q6H PRN Regalado, Belkys A, MD       ipratropium-albuterol (DUONEB) 0.5-2.5 (3) MG/3ML nebulizer solution 3 mL  3 mL Nebulization Q4H PRN Regalado, Belkys A, MD       ipratropium-albuterol (DUONEB) 0.5-2.5 (3) MG/3ML nebulizer solution 3 mL  3 mL Nebulization BID Regalado, Belkys A, MD   3 mL at 04/19/22 1007   morphine (PF) 2 MG/ML injection 0.5 mg  0.5 mg Intravenous Q4H PRN Regalado, Belkys A, MD   0.5 mg at 04/19/22 1534   naloxone (NARCAN) injection 0.4 mg  0.4 mg Intravenous PRN Shela Leff, MD       ondansetron (ZOFRAN) injection 4 mg  4 mg Intravenous Q6H PRN Shela Leff, MD   4 mg at 04/17/22 0953   pantoprazole  (PROTONIX) injection 40 mg  40 mg Intravenous Q12H Regalado, Belkys A, MD   40 mg at 04/19/22 0930   piperacillin-tazobactam (ZOSYN) IVPB 3.375 g  3.375 g Intravenous Q8H Rathore, Wandra Feinstein, MD 12.5 mL/hr at 04/19/22 1542 3.375 g at 04/19/22 1542   polyethylene glycol (MIRALAX / GLYCOLAX) packet 17 g  17 g Oral Daily Saverio Danker, PA-C       potassium chloride SA (KLOR-CON M) CR tablet 40 mEq  40 mEq Oral Daily Patwardhan, Manish J, MD   40 mEq at 04/19/22 0930      Allergies  Allergen  Reactions   Cefadroxil Other (See Comments)    unknown  :   Family History  Problem Relation Age of Onset   Cancer Mother    Hypertension Father    Heart attack Father    Cirrhosis Brother    Cancer Brother        In spine   Heart attack Brother   :   Social History   Socioeconomic History   Marital status: Married    Spouse name: Not on file   Number of children: Not on file   Years of education: Not on file   Highest education level: Not on file  Occupational History   Not on file  Tobacco Use   Smoking status: Every Day    Packs/day: 0.50    Years: 40.00    Total pack years: 20.00    Types: Cigarettes   Smokeless tobacco: Never   Tobacco comments:    1-1 1/2 ppd  Vaping Use   Vaping Use: Never used  Substance and Sexual Activity   Alcohol use: Not Currently    Alcohol/week: 0.0 standard drinks of alcohol   Drug use: Never   Sexual activity: Not on file  Other Topics Concern   Not on file  Social History Narrative   Not on file   Social Determinants of Health   Financial Resource Strain: Not on file  Food Insecurity: No Food Insecurity (04/17/2022)   Hunger Vital Sign    Worried About Running Out of Food in the Last Year: Never true    Ran Out of Food in the Last Year: Never true  Transportation Needs: No Transportation Needs (04/17/2022)   PRAPARE - Hydrologist (Medical): No    Lack of Transportation (Non-Medical): No  Physical  Activity: Not on file  Stress: Not on file  Social Connections: Not on file  Intimate Partner Violence: Not At Risk (04/17/2022)   Humiliation, Afraid, Rape, and Kick questionnaire    Fear of Current or Ex-Partner: No    Emotionally Abused: No    Physically Abused: No    Sexually Abused: No    Exam: Patient Vitals for the past 24 hrs:  BP Temp Temp src Pulse Resp SpO2 Weight  04/19/22 0931 -- -- -- -- -- 97 % --  04/19/22 0848 125/76 98.7 F (37.1 C) Oral 77 18 98 % --  04/19/22 0435 (!) 161/55 97.8 F (36.6 C) Oral 81 20 91 % --  04/19/22 0434 -- -- -- -- -- -- 185 lb 3.2 oz (84 kg)  04/19/22 0006 (!) 126/53 98.9 F (37.2 C) Oral 80 20 93 % --  04/18/22 1923 (!) 128/54 99.1 F (37.3 C) Oral 75 18 94 % --    Physical Exam Constitutional:      Appearance: Normal appearance.  Cardiovascular:     Rate and Rhythm: Normal rate and regular rhythm.     Pulses: Normal pulses.     Heart sounds: Normal heart sounds.  Pulmonary:     Effort: Pulmonary effort is normal.     Breath sounds: Normal breath sounds.  Abdominal:     General: Abdomen is flat.     Palpations: Abdomen is soft.  Musculoskeletal:        General: No swelling.     Cervical back: Normal range of motion and neck supple. No rigidity.  Lymphadenopathy:     Cervical: No cervical adenopathy.  Skin:    General: Skin is warm and  dry.  Neurological:     General: No focal deficit present.     Mental Status: She is alert.  Psychiatric:        Mood and Affect: Mood normal.       Lab Results  Component Value Date   WBC 13.0 (H) 04/19/2022   HGB 9.3 (L) 04/19/2022   HCT 29.1 (L) 04/19/2022   PLT 321 04/19/2022   GLUCOSE 104 (H) 04/19/2022   CHOL 144 05/10/2015   TRIG 88 05/12/2015   HDL 29 (L) 05/10/2015   LDLCALC 82 05/10/2015   ALT 8 04/16/2022   AST 14 (L) 04/16/2022   NA 133 (L) 04/19/2022   K 3.5 04/19/2022   CL 99 04/19/2022   CREATININE 0.76 04/19/2022   BUN 9 04/19/2022   CO2 25 04/19/2022     VAS US CAROTID  Result Date: 04/19/2022 Carotid Arterial Duplex Study Patient Name:  MARLEE BOTTARO  Date of Exam:   04/19/2022 Medical Rec #: [id number]   Accession #:    [id number] Date of Birth: November 01, 1950    Patient Gender: F Patient Age:   39 years Exam Location:  [facility] Procedure:      VAS US CAROTID Referring Phys: Alferd Apa --------------------------------------------------------------------------------  Indications:       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 Risk Factors:      Hypertension, hyperlipidemia, Diabetes, past history of                    smoking, prior CVA, PAD. Comparison Study:  06-07-2021 Most recent prior carotid duplex. Performing Technologist: Darlin Coco RDMS, RVT  Examination Guidelines: A complete evaluation includes B-mode imaging, spectral Doppler, color Doppler, and power Doppler as needed of all accessible portions of each vessel. Bilateral testing is considered an integral part of a complete examination. Limited examinations for reoccurring indications may be performed as noted.  Right Carotid Findings: +----------+--------+--------+--------+------------------+--------+           PSV cm/sEDV cm/sStenosisPlaque DescriptionComments +----------+--------+--------+--------+------------------+--------+ CCA Prox                  Occluded                           +----------+--------+--------+--------+------------------+--------+ CCA Mid                   Occluded                           +----------+--------+--------+--------+------------------+--------+ CCA Distal                Occluded                           +----------+--------+--------+--------+------------------+--------+ ICA Prox                  Occluded                            +----------+--------+--------+--------+------------------+--------+  ICA Mid                   Occluded                           +----------+--------+--------+--------+------------------+--------+ ICA Distal                Occluded                           +----------+--------+--------+--------+------------------+--------+ ECA       39      10                                         +----------+--------+--------+--------+------------------+--------+ +----------+--------+-------+---------+-------------------+           PSV cm/sEDV cmsDescribe Arm Pressure (mmHG) +----------+--------+-------+---------+-------------------+ [id number]            Turbulent                    +----------+--------+-------+---------+-------------------+ +---------+--------+---+--------+--+---------+ VertebralPSV cm/s122EDV cm/s16Antegrade +---------+--------+---+--------+--+---------+  Left Carotid Findings: +----------+--------+--------+--------+------------------+--------+           PSV cm/sEDV cm/sStenosisPlaque DescriptionComments +----------+--------+--------+--------+------------------+--------+ CCA Prox  48      19                                         +----------+--------+--------+--------+------------------+--------+ CCA Distal                                          Stent    +----------+--------+--------+--------+------------------+--------+ ICA Prox                                            Stent    +----------+--------+--------+--------+------------------+--------+ ICA Mid                                             Stent    +----------+--------+--------+--------+------------------+--------+ ICA Distal117     40                                         +----------+--------+--------+--------+------------------+--------+ ECA       88      15                                          +----------+--------+--------+--------+------------------+--------+ +----------+--------+--------+---------+-------------------+           PSV cm/sEDV cm/sDescribe Arm Pressure (mmHG) +----------+--------+--------+---------+-------------------+ Subclavian307             Turbulent                    +----------+--------+--------+---------+-------------------+ +---------+--------+---+--------+--+---------+ VertebralPSV cm/s111EDV cm/s25Antegrade +---------+--------+---+--------+--+---------+  Left Stent(s): +--------------+---+---+------------+---------+-------------------------------+ Prox to Stent 52 19                                                      +--------------+---+---+------------+---------+-------------------------------+  Proximal Stent68 20                                                      +--------------+---+---+------------+---------+-------------------------------+ Mid Stent     415114>75%                 Calcific w/ posteriorior                            stenosis             shadowing                       +--------------+---+---+------------+---------+-------------------------------+ Distal Stent  15244             Turbulent                                +--------------+---+---+------------+---------+-------------------------------+ Distal to     11129                                                      Stent                                                                    +--------------+---+---+------------+---------+-------------------------------+    Summary: Right Carotid: Evidence consistent with a total occlusion of the right ICA. The                CCA appears occluded. Left Carotid: >75% stenosis is again visualized in the stent. Velocities appear               essentially unchanged as compared to previous examination. Vertebrals:  Bilateral vertebral arteries demonstrate antegrade flow. Subclavians: Bilateral  subclavian artery flow was disturbed. Increased PSV              bilaterally as compared to previous examination. *See table(s) above for measurements and observations.     Preliminary    ECHOCARDIOGRAM COMPLETE  Result Date: 04/19/2022    ECHOCARDIOGRAM REPORT   Patient Name:   AMANDINE AUGER Date of Exam: 04/19/2022 Medical Rec #:  [id number]  Height:       63.0 in Accession #:    [id number] Weight:       185.2 lb Date of Birth:  03-02-1950   BSA:          1.871 m Patient Age:    73 years   BP:           125/76 mmHg Patient Gender: F          HR:           77 bpm. Exam Location:  Inpatient Procedure: 2D Echo, Cardiac Doppler and Color Doppler Indications:    R06.9 DOE  History:        Patient has prior history of Echocardiogram examinations, most  recent 05/10/2015. CHF, PAD, Stroke and Carotid Disease; Risk                 Factors:Hypertension and Current Smoker.  Sonographer:    Wilkie Aye RVT RCS Referring Phys: [id number] [place]  1. Left ventricular ejection fraction, by estimation, is 65 to 70%. The left ventricle has normal function. The left ventricle has no regional wall motion abnormalities. There is mild concentric left ventricular hypertrophy. Left ventricular diastolic parameters are consistent with Grade II diastolic dysfunction (pseudonormalization). There is the interventricular septum is flattened in systole, consistent with right ventricular pressure overload.  2. Right ventricular systolic function is normal. The right ventricular size is normal. There is moderately elevated pulmonary artery systolic pressure. The estimated right ventricular systolic pressure is [id number] mmHg.  3. Left atrial size was mildly dilated.  4. The mitral valve is normal in structure. Mild to moderate mitral valve regurgitation. No evidence of mitral stenosis.  5. The aortic valve is tricuspid. There is mild thickening of the aortic valve. Aortic valve regurgitation is not visualized.  Aortic valve sclerosis is present, with no evidence of aortic valve stenosis.  6. There is systolic notching of the pulmonary systolic Doppler signal, consistent with increased pulmonary vascular resistance.  7. The inferior vena cava is dilated in size with >50% respiratory variability, suggesting right atrial pressure of 8 mmHg. Comparison(s): Prior images reviewed side by side. Changes from prior study are noted. The systolic pulmonary artery pressure is higher. FINDINGS  Left Ventricle: Left ventricular ejection fraction, by estimation, is 65 to 70%. The left ventricle has normal function. The left ventricle has no regional wall motion abnormalities. The left ventricular internal cavity size was normal in size. There is  mild concentric left ventricular hypertrophy. The interventricular septum is flattened in systole, consistent with right ventricular pressure overload. Left ventricular diastolic parameters are consistent with Grade II diastolic dysfunction (pseudonormalization). Normal left ventricular filling pressure. Right Ventricle: The right ventricular size is normal. No increase in right ventricular wall thickness. Right ventricular systolic function is normal. There is moderately elevated pulmonary artery systolic pressure. The tricuspid regurgitant velocity is 3.42 m/s, and with an assumed right atrial pressure of 8 mmHg, the estimated right ventricular systolic pressure is [id number] mmHg. Left Atrium: Left atrial size was mildly dilated. Right Atrium: Right atrial size was normal in size. Pericardium: There is no evidence of pericardial effusion. Mitral Valve: The mitral valve is normal in structure. Mild mitral annular calcification. Mild to moderate mitral valve regurgitation, with posteriorly-directed jet. No evidence of mitral valve stenosis. Tricuspid Valve: The tricuspid valve is normal in structure. Tricuspid valve regurgitation is mild. Aortic Valve: The aortic valve is tricuspid. There is mild  thickening of the aortic valve. Aortic valve regurgitation is not visualized. Aortic valve sclerosis is present, with no evidence of aortic valve stenosis. Aortic valve mean gradient measures 5.0  mmHg. Aortic valve peak gradient measures 10.1 mmHg. Aortic valve area, by VTI measures 1.80 cm. Pulmonic Valve: There is systolic notching of the pulmonary systolic Doppler signal, consistent with increased pulmonary vascular resistance. The pulmonic valve was normal in structure. Pulmonic valve regurgitation is trivial. No evidence of pulmonic stenosis. Aorta: The aortic root and ascending aorta are structurally normal, with no evidence of dilitation. Venous: The inferior vena cava is dilated in size with greater than 50% respiratory variability, suggesting right atrial pressure of 8 mmHg. IAS/Shunts: No atrial level shunt detected by color flow Doppler.  LEFT VENTRICLE PLAX  2D LVIDd:         4.20 cm   Diastology LVIDs:         2.90 cm   LV e' medial:    7.05 cm/s LV PW:         1.30 cm   LV E/e' medial:  22.0 LV IVS:        1.30 cm   LV e' lateral:   6.00 cm/s LVOT diam:     1.80 cm   LV E/e' lateral: 25.8 LV SV:         59 LV SV Index:   31 LVOT Area:     2.54 cm  RIGHT VENTRICLE             IVC RV Basal diam:  2.80 cm     IVC diam: 2.10 cm RV S prime:     15.20 cm/s TAPSE (M-mode): 3.0 cm LEFT ATRIUM             Index        RIGHT ATRIUM           Index LA diam:        3.20 cm 1.71 cm/m   RA Area:     14.30 cm LA Vol (A2C):   63.2 ml 33.77 ml/m  RA Volume:   34.80 ml  18.59 ml/m LA Vol (A4C):   51.2 ml 27.36 ml/m LA Biplane Vol: 63.3 ml 33.82 ml/m  AORTIC VALVE                     PULMONIC VALVE AV Area (Vmax):    1.81 cm      PV Vmax:       0.95 m/s AV Area (Vmean):   1.76 cm      PV Peak grad:  3.6 mmHg AV Area (VTI):     1.80 cm AV Vmax:           159.00 cm/s AV Vmean:          108.000 cm/s AV VTI:            0.325 m AV Peak Grad:      10.1 mmHg AV Mean Grad:      5.0 mmHg LVOT Vmax:         113.00 cm/s  LVOT Vmean:        74.500 cm/s LVOT VTI:          0.230 m LVOT/AV VTI ratio: 0.71  AORTA Ao Root diam: 2.70 cm Ao Arch diam: 2.3 cm MITRAL VALVE                TRICUSPID VALVE MV Area (PHT): 2.87 cm     TR Peak grad:   46.8 mmHg MV Decel Time: 264 msec     TR Vmax:        342.00 cm/s MV E velocity: 155.00 cm/s MV A velocity: 167.00 cm/s  SHUNTS MV E/A ratio:  0.93         Systemic VTI:  0.23 m                             Systemic Diam: 1.80 cm Dani Gobble Croitoru MD Electronically signed by Sanda Klein MD Signature Date/Time: 04/19/2022/1:21:17 PM    Final    CT CHEST WO CONTRAST  Result Date: 04/19/2022 CLINICAL DATA:  Colon cancer. Assess treatment response. Evaluate for metastatic disease. * Tracking Code:  BO * EXAM: CT CHEST WITHOUT CONTRAST TECHNIQUE: Multidetector CT imaging of the chest was performed following the standard protocol without IV contrast. RADIATION DOSE REDUCTION: This exam was performed according to the departmental dose-optimization program which includes automated exposure control, adjustment of the mA and/or kV according to patient size and/or use of iterative reconstruction technique. COMPARISON:  None Available. FINDINGS: Cardiovascular: No acute cardiovascular findings on noncontrast exam. Coronary artery calcification and aortic atherosclerotic calcification. Mediastinum/Nodes: Large RIGHT lower paratracheal lymph node measures 3.3 cm. Subcarinal lymph node measures 2.1 cm. No supraclavicular or axillary adenopathy Lungs/Pleura: Ovoid nodule in the posterior RIGHT lower lobe measures 2.7 cm. This nodule may be above the imaging on recent CT of the abdomen. There is new interstitial thickening at the LEFT and RIGHT lung base compared to prior. One nodule in the LEFT lower lobe measuring 11 mm (image 99/4 is increased from 8 mm on recent exam. Small effusions. No upper lobe nodularity Upper Abdomen: No abnormality on noncontrast exam Musculoskeletal: No aggressive osseous lesion.  IMPRESSION: 1. Bulky mediastinal nodal metastasis. 2. Oblong nodule in the RIGHT lower lobe increase in size of LEFT lower lobe nodule over short interval. Findings favor bilateral lower lobe nodular metastasis. 3. New bibasilar interstitial thickening over a 3 day interval. Concern for rapid progression lymphangitic spread versus atelectasis and interstitial thickening related to recent anaesthesia for colonoscopy. Favor benign interstitial thickening. Electronically Signed   By: Suzy Bouchard M.D.   On: 04/19/2022 08:58   DG CHEST PORT 1 VIEW  Result Date: 04/17/2022 CLINICAL DATA:  Lower abdominal pain with nausea and vomiting. EXAM: PORTABLE CHEST 1 VIEW COMPARISON:  May 15, 2015 FINDINGS: The heart size and mediastinal contours are within normal limits. There is moderate severity calcification of the aortic arch. Low lung volumes are noted with mild atelectatic changes seen within the bilateral lung bases. There is no evidence of a pleural effusion or pneumothorax. A radiopaque stent is seen overlying the soft tissues of left neck. The visualized skeletal structures are unremarkable. IMPRESSION: Low lung volumes with mild bibasilar atelectasis. Electronically Signed   By: Virgina Norfolk M.D.   On: 04/17/2022 00:29   CT Abdomen Pelvis W Contrast  Result Date: 04/16/2022 CLINICAL DATA:  Lower abdominal pain EXAM: CT ABDOMEN AND PELVIS WITH CONTRAST TECHNIQUE: Multidetector CT imaging of the abdomen and pelvis was performed using the standard protocol following bolus administration of intravenous contrast. RADIATION DOSE REDUCTION: This exam was performed according to the departmental dose-optimization program which includes automated exposure control, adjustment of the mA and/or kV according to patient size and/or use of iterative reconstruction technique. CONTRAST:  24m OMNIPAQUE IOHEXOL 350 MG/ML SOLN COMPARISON:  04/05/2021 FINDINGS: Lower chest: No acute abnormality.  Small hiatal hernia.  Hepatobiliary: Stable hypodensities within segment 4B and segment 5 likely representing small hepatic cysts. Liver otherwise unremarkable. No intra or extrahepatic biliary ductal dilation. Gallbladder unremarkable. Pancreas: Unremarkable Spleen: Unremarkable Adrenals/Urinary Tract: The adrenal glands are unremarkable. The kidneys are normal. The bladder is unremarkable. Stomach/Bowel: There is circumferential wall thickening involving the mid sigmoid colon with associated hyperemia suspicious for an infiltrative mass in this location. Focal inflammatory changes as can be seen with inflammatory bowel disease could appear similarly. There is infiltration within the adjacent sigmoid mesentery best seen on axial image # 69/3 suspicious for transmural infiltration. This infiltrative change appears to abut several loops of small bowel within the deep pelvis, best seen on sagittal image # 91/7. Mild proximal sigmoid diverticulosis. No evidence of  obstruction. Stomach, small bowel, and large bowel are otherwise unremarkable. Appendix normal. Vascular/Lymphatic: There is pathologic left periaortic lymphadenopathy with an index lymph node measuring 2.1 x 2.5 cm at axial image # 36/3. 6 mm enhancing nodule within the sigmoid mesocolon and axial image # 72/3 likely represents a pathologic mesenteric lymph node or small omental implant. Moderate aortoiliac atherosclerotic calcification. Bilateral common iliac artery stenting has been performed. No aortic aneurysm. Reproductive: Status post hysterectomy. No adnexal masses. Other: Small fat containing left inguinal hernia. Musculoskeletal: No acute bone abnormality. No lytic or blastic bone lesion. IMPRESSION: 1. Circumferential wall thickening involving the mid sigmoid colon with associated hyperemia suspicious for an infiltrative mass in this location. Associated infiltration within the sigmoid mesocolon suggests transmural infiltration. Associated pathologic adenopathy within  the sigmoid mesentery and left periaortic lymph node group. No evidence of obstruction. Correlation with endoscopy is recommended for further evaluation. Aortic Atherosclerosis (ICD10-I70.0). Electronically Signed   By: Fidela Salisbury M.D.   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 Risk Factors:      Hypertension, hyperlipidemia, Diabetes, past history of                    smoking, prior CVA, PAD. Comparison Study:  06-07-2021 Most recent prior carotid duplex. Performing Technologist: Darlin Coco RDMS, RVT  Examination Guidelines: A complete evaluation includes B-mode imaging, spectral Doppler, color Doppler, and power Doppler as needed of all accessible portions of each vessel. Bilateral testing is considered an integral part of a complete examination. Limited examinations for reoccurring indications may be performed as noted.  Right Carotid Findings: +----------+--------+--------+--------+------------------+--------+           PSV cm/sEDV cm/sStenosisPlaque DescriptionComments +----------+--------+--------+--------+------------------+--------+ CCA Prox                  Occluded                           +----------+--------+--------+--------+------------------+--------+ CCA Mid                   Occluded                            +----------+--------+--------+--------+------------------+--------+ CCA Distal                Occluded                           +----------+--------+--------+--------+------------------+--------+ ICA Prox  Occluded                           +----------+--------+--------+--------+------------------+--------+ ICA Mid                   Occluded                           +----------+--------+--------+--------+------------------+--------+ ICA Distal                Occluded                           +----------+--------+--------+--------+------------------+--------+ ECA       39      10                                         +----------+--------+--------+--------+------------------+--------+ +----------+--------+-------+---------+-------------------+           PSV cm/sEDV cmsDescribe Arm Pressure (mmHG) +----------+--------+-------+---------+-------------------+ [id number]            Turbulent                    +----------+--------+-------+---------+-------------------+ +---------+--------+---+--------+--+---------+ VertebralPSV cm/s122EDV cm/s16Antegrade +---------+--------+---+--------+--+---------+  Left Carotid Findings: +----------+--------+--------+--------+------------------+--------+           PSV cm/sEDV cm/sStenosisPlaque DescriptionComments +----------+--------+--------+--------+------------------+--------+ CCA Prox  48      19                                         +----------+--------+--------+--------+------------------+--------+ CCA Distal                                          Stent    +----------+--------+--------+--------+------------------+--------+ ICA Prox                                            Stent    +----------+--------+--------+--------+------------------+--------+ ICA Mid                                             Stent     +----------+--------+--------+--------+------------------+--------+ ICA Distal117     40                                         +----------+--------+--------+--------+------------------+--------+ ECA       88      15                                         +----------+--------+--------+--------+------------------+--------+ +----------+--------+--------+---------+-------------------+           PSV cm/sEDV cm/sDescribe Arm Pressure (mmHG) +----------+--------+--------+---------+-------------------+ [id number]             Turbulent                    +----------+--------+--------+---------+-------------------+ +---------+--------+---+--------+--+---------+  VertebralPSV cm/s111EDV cm/s25Antegrade +---------+--------+---+--------+--+---------+  Left Stent(s): +--------------+---+---+------------+---------+-------------------------------+ Prox to Stent 52 19                                                      +--------------+---+---+------------+---------+-------------------------------+ Proximal Stent68 20                                                      +--------------+---+---+------------+---------+-------------------------------+ Mid Stent     [id number]>75%                 Calcific w/ posteriorior                            stenosis             shadowing                       +--------------+---+---+------------+---------+-------------------------------+ Distal Stent  [id number]             Turbulent                                +--------------+---+---+------------+---------+-------------------------------+ Distal to     11129                                                      Stent                                                                    +--------------+---+---+------------+---------+-------------------------------+    Summary: Right Carotid: Evidence consistent with a total occlusion of the right ICA. The                 CCA appears occluded. Left Carotid: >75% stenosis is again visualized in the stent. Velocities appear               essentially unchanged as compared to previous examination. Vertebrals:  Bilateral vertebral arteries demonstrate antegrade flow. Subclavians: Bilateral subclavian artery flow was disturbed. Increased PSV              bilaterally as compared to previous examination. *See table(s) above for measurements and observations.     Preliminary    ECHOCARDIOGRAM COMPLETE  Result Date: 04/19/2022    ECHOCARDIOGRAM REPORT   Patient Name:   JARIN HOLES Date of Exam: 04/19/2022 Medical Rec #:  [id number]  Height:       63.0 in Accession #:    [id number] Weight:       185.2 lb Date of Birth:  December 17, 1950   BSA:          1.871 m Patient Age:    77 years   BP:  125/76 mmHg Patient Gender: F          HR:           77 bpm. Exam Location:  Inpatient Procedure: 2D Echo, Cardiac Doppler and Color Doppler Indications:    R06.9 DOE  History:        Patient has prior history of Echocardiogram examinations, most                 recent 05/10/2015. CHF, PAD, Stroke and Carotid Disease; Risk                 Factors:Hypertension and Current Smoker.  Sonographer:    Wilkie Aye RVT RCS Referring Phys: [id number] [place]  1. Left ventricular ejection fraction, by estimation, is 65 to 70%. The left ventricle has normal function. The left ventricle has no regional wall motion abnormalities. There is mild concentric left ventricular hypertrophy. Left ventricular diastolic parameters are consistent with Grade II diastolic dysfunction (pseudonormalization). There is the interventricular septum is flattened in systole, consistent with right ventricular pressure overload.  2. Right ventricular systolic function is normal. The right ventricular size is normal. There is moderately elevated pulmonary artery systolic pressure. The estimated right ventricular systolic pressure is [id number] mmHg.  3. Left atrial size was  mildly dilated.  4. The mitral valve is normal in structure. Mild to moderate mitral valve regurgitation. No evidence of mitral stenosis.  5. The aortic valve is tricuspid. There is mild thickening of the aortic valve. Aortic valve regurgitation is not visualized. Aortic valve sclerosis is present, with no evidence of aortic valve stenosis.  6. There is systolic notching of the pulmonary systolic Doppler signal, consistent with increased pulmonary vascular resistance.  7. The inferior vena cava is dilated in size with >50% respiratory variability, suggesting right atrial pressure of 8 mmHg. Comparison(s): Prior images reviewed side by side. Changes from prior study are noted. The systolic pulmonary artery pressure is higher. FINDINGS  Left Ventricle: Left ventricular ejection fraction, by estimation, is 65 to 70%. The left ventricle has normal function. The left ventricle has no regional wall motion abnormalities. The left ventricular internal cavity size was normal in size. There is  mild concentric left ventricular hypertrophy. The interventricular septum is flattened in systole, consistent with right ventricular pressure overload. Left ventricular diastolic parameters are consistent with Grade II diastolic dysfunction (pseudonormalization). Normal left ventricular filling pressure. Right Ventricle: The right ventricular size is normal. No increase in right ventricular wall thickness. Right ventricular systolic function is normal. There is moderately elevated pulmonary artery systolic pressure. The tricuspid regurgitant velocity is 3.42 m/s, and with an assumed right atrial pressure of 8 mmHg, the estimated right ventricular systolic pressure is [id number] mmHg. Left Atrium: Left atrial size was mildly dilated. Right Atrium: Right atrial size was normal in size. Pericardium: There is no evidence of pericardial effusion. Mitral Valve: The mitral valve is normal in structure. Mild mitral annular calcification. Mild to  moderate mitral valve regurgitation, with posteriorly-directed jet. No evidence of mitral valve stenosis. Tricuspid Valve: The tricuspid valve is normal in structure. Tricuspid valve regurgitation is mild. Aortic Valve: The aortic valve is tricuspid. There is mild thickening of the aortic valve. Aortic valve regurgitation is not visualized. Aortic valve sclerosis is present, with no evidence of aortic valve stenosis. Aortic valve mean gradient measures 5.0  mmHg. Aortic valve peak gradient measures 10.1 mmHg. Aortic valve area, by VTI measures 1.80 cm. Pulmonic Valve: There is systolic notching of the  pulmonary systolic Doppler signal, consistent with increased pulmonary vascular resistance. The pulmonic valve was normal in structure. Pulmonic valve regurgitation is trivial. No evidence of pulmonic stenosis. Aorta: The aortic root and ascending aorta are structurally normal, with no evidence of dilitation. Venous: The inferior vena cava is dilated in size with greater than 50% respiratory variability, suggesting right atrial pressure of 8 mmHg. IAS/Shunts: No atrial level shunt detected by color flow Doppler.  LEFT VENTRICLE PLAX 2D LVIDd:         4.20 cm   Diastology LVIDs:         2.90 cm   LV e' medial:    7.05 cm/s LV PW:         1.30 cm   LV E/e' medial:  22.0 LV IVS:        1.30 cm   LV e' lateral:   6.00 cm/s LVOT diam:     1.80 cm   LV E/e' lateral: 25.8 LV SV:         59 LV SV Index:   31 LVOT Area:     2.54 cm  RIGHT VENTRICLE             IVC RV Basal diam:  2.80 cm     IVC diam: 2.10 cm RV S prime:     15.20 cm/s TAPSE (M-mode): 3.0 cm LEFT ATRIUM             Index        RIGHT ATRIUM           Index LA diam:        3.20 cm 1.71 cm/m   RA Area:     14.30 cm LA Vol (A2C):   63.2 ml 33.77 ml/m  RA Volume:   34.80 ml  18.59 ml/m LA Vol (A4C):   51.2 ml 27.36 ml/m LA Biplane Vol: 63.3 ml 33.82 ml/m  AORTIC VALVE                     PULMONIC VALVE AV Area (Vmax):    1.81 cm      PV Vmax:       0.95  m/s AV Area (Vmean):   1.76 cm      PV Peak grad:  3.6 mmHg AV Area (VTI):     1.80 cm AV Vmax:           159.00 cm/s AV Vmean:          108.000 cm/s AV VTI:            0.325 m AV Peak Grad:      10.1 mmHg AV Mean Grad:      5.0 mmHg LVOT Vmax:         113.00 cm/s LVOT Vmean:        74.500 cm/s LVOT VTI:          0.230 m LVOT/AV VTI ratio: 0.71  AORTA Ao Root diam: 2.70 cm Ao Arch diam: 2.3 cm MITRAL VALVE                TRICUSPID VALVE MV Area (PHT): 2.87 cm     TR Peak grad:   46.8 mmHg MV Decel Time: 264 msec     TR Vmax:        342.00 cm/s MV E velocity: 155.00 cm/s MV A velocity: 167.00 cm/s  SHUNTS MV E/A ratio:  0.93         Systemic VTI:  0.23 m                             Systemic Diam: 1.80 cm Sanda Klein MD Electronically signed by Sanda Klein MD Signature Date/Time: 04/19/2022/1:21:17 PM    Final    CT CHEST WO CONTRAST  Result Date: 04/19/2022 CLINICAL DATA:  Colon cancer. Assess treatment response. Evaluate for metastatic disease. * Tracking Code: BO * EXAM: CT CHEST WITHOUT CONTRAST TECHNIQUE: Multidetector CT imaging of the chest was performed following the standard protocol without IV contrast. RADIATION DOSE REDUCTION: This exam was performed according to the departmental dose-optimization program which includes automated exposure control, adjustment of the mA and/or kV according to patient size and/or use of iterative reconstruction technique. COMPARISON:  None Available. FINDINGS: Cardiovascular: No acute cardiovascular findings on noncontrast exam. Coronary artery calcification and aortic atherosclerotic calcification. Mediastinum/Nodes: Large RIGHT lower paratracheal lymph node measures 3.3 cm. Subcarinal lymph node measures 2.1 cm. No supraclavicular or axillary adenopathy Lungs/Pleura: Ovoid nodule in the posterior RIGHT lower lobe measures 2.7 cm. This nodule may be above the imaging on recent CT of the abdomen. There is new interstitial thickening at the LEFT and RIGHT lung base  compared to prior. One nodule in the LEFT lower lobe measuring 11 mm (image 99/4 is increased from 8 mm on recent exam. Small effusions. No upper lobe nodularity Upper Abdomen: No abnormality on noncontrast exam Musculoskeletal: No aggressive osseous lesion. IMPRESSION: 1. Bulky mediastinal nodal metastasis. 2. Oblong nodule in the RIGHT lower lobe increase in size of LEFT lower lobe nodule over short interval. Findings favor bilateral lower lobe nodular metastasis. 3. New bibasilar interstitial thickening over a 3 day interval. Concern for rapid progression lymphangitic spread versus atelectasis and interstitial thickening related to recent anaesthesia for colonoscopy. Favor benign interstitial thickening. Electronically Signed   By: Suzy Bouchard M.D.   On: 04/19/2022 08:58   DG CHEST PORT 1 VIEW  Result Date: 04/17/2022 CLINICAL DATA:  Lower abdominal pain with nausea and vomiting. EXAM: PORTABLE CHEST 1 VIEW COMPARISON:  May 15, 2015 FINDINGS: The heart size and mediastinal contours are within normal limits. There is moderate severity calcification of the aortic arch. Low lung volumes are noted with mild atelectatic changes seen within the bilateral lung bases. There is no evidence of a pleural effusion or pneumothorax. A radiopaque stent is seen overlying the soft tissues of left neck. The visualized skeletal structures are unremarkable. IMPRESSION: Low lung volumes with mild bibasilar atelectasis. Electronically Signed   By: Virgina Norfolk M.D.   On: 04/17/2022 00:29   CT Abdomen Pelvis W Contrast  Result Date: 04/16/2022 CLINICAL DATA:  Lower abdominal pain EXAM: CT ABDOMEN AND PELVIS WITH CONTRAST TECHNIQUE: Multidetector CT imaging of the abdomen and pelvis was performed using the standard protocol following bolus administration of intravenous contrast. RADIATION DOSE REDUCTION: This exam was performed according to the departmental dose-optimization program which includes automated exposure  control, adjustment of the mA and/or kV according to patient size and/or use of iterative reconstruction technique. CONTRAST:  29m OMNIPAQUE IOHEXOL 350 MG/ML SOLN COMPARISON:  04/05/2021 FINDINGS: Lower chest: No acute abnormality.  Small hiatal hernia. Hepatobiliary: Stable hypodensities within segment 4B and segment 5 likely representing small hepatic cysts. Liver otherwise unremarkable. No intra or extrahepatic biliary ductal dilation. Gallbladder unremarkable. Pancreas: Unremarkable Spleen: Unremarkable Adrenals/Urinary Tract: The adrenal glands are unremarkable. The kidneys are normal. The bladder is unremarkable. Stomach/Bowel: There is circumferential wall thickening involving  the mid sigmoid colon with associated hyperemia suspicious for an infiltrative mass in this location. Focal inflammatory changes as can be seen with inflammatory bowel disease could appear similarly. There is infiltration within the adjacent sigmoid mesentery best seen on axial image # 69/3 suspicious for transmural infiltration. This infiltrative change appears to abut several loops of small bowel within the deep pelvis, best seen on sagittal image # 91/7. Mild proximal sigmoid diverticulosis. No evidence of obstruction. Stomach, small bowel, and large bowel are otherwise unremarkable. Appendix normal. Vascular/Lymphatic: There is pathologic left periaortic lymphadenopathy with an index lymph node measuring 2.1 x 2.5 cm at axial image # 36/3. 6 mm enhancing nodule within the sigmoid mesocolon and axial image # 72/3 likely represents a pathologic mesenteric lymph node or small omental implant. Moderate aortoiliac atherosclerotic calcification. Bilateral common iliac artery stenting has been performed. No aortic aneurysm. Reproductive: Status post hysterectomy. No adnexal masses. Other: Small fat containing left inguinal hernia. Musculoskeletal: No acute bone abnormality. No lytic or blastic bone lesion. IMPRESSION: 1. Circumferential  wall thickening involving the mid sigmoid colon with associated hyperemia suspicious for an infiltrative mass in this location. Associated infiltration within the sigmoid mesocolon suggests transmural infiltration. Associated pathologic adenopathy within the sigmoid mesentery and left periaortic lymph node group. No evidence of obstruction. Correlation with endoscopy is recommended for further evaluation. Aortic Atherosclerosis (ICD10-I70.0). Electronically Signed   By: Fidela Salisbury M.D.   On: 04/16/2022 20:44    Assessment and Plan:   This is a very pleasant 72 year old female patient with newly diagnosed adenocarcinoma of the sigmoid colon, bilateral lung metastasis, mediastinal adenopathy admitted with a chief complaint of nausea, vomiting.  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  She had surgery followed by intracavitary brachytherapy.  She had colonoscopy yesterday by Dr. Lorenso Courier which showed villous and polypoid partially obstructing large mass in the sigmoid colon, partially circumferential measuring 5 cm in length.  Sigmoid mass biopsy showed adenocarcinoma.  At this time we have discussed that she most likely has advanced adenocarcinoma of the sigmoid colon as well as bulky mediastinal nodal metastasis with bilateral lower lobe nodular metastasis.  We have discussed that the nature of the treatment is palliative, she cannot be cured with advanced colon cancer.  She understands there is no role for surgery except for palliative surgery given the near obstruction.  She understands there is no role for radiation at this time.  I have recommended that we consider molecular testing and consider outpatient chemotherapy.  We will consider molecular testing as well as HER2 amplification, MMR/MSI status.  Treatment recommendations will based on molecular testing results.  We will arrange follow-up in the  outpatient setting for further recommendations.  Agree with iron replacement for her microcytic hypochromic anemia which is likely secondary to iron deficiency.  Patient and her husband had some good questions, all questions were answered to the best my knowledge I do not believe this cancer is related to her uterine cancer.  The length of time of the face-to-face encounter was 75 minutes. More than 50% of time was spent counseling and coordination of care.     Thank you for this referral.

## 2022-04-19 NOTE — Progress Notes (Signed)
Gastroenterology Inpatient Follow Up    Subjective: Passed a blood clot after her colonoscopy yesterday but then passed a stool today that was clear. Still has ab pain that has been stable since prior.   Objective: Vital signs in last 24 hours: Temp:  [97.8 F (36.6 C)-99.1 F (37.3 C)] 98.7 F (37.1 C) (02/27 0848) Pulse Rate:  [75-81] 77 (02/27 0848) Resp:  [18-20] 18 (02/27 0848) BP: (125-161)/(53-76) 125/76 (02/27 0848) SpO2:  [91 %-98 %] 97 % (02/27 0931) Weight:  [84 kg] 84 kg (02/27 0434) Last BM Date : 04/19/22  Intake/Output from previous day: 02/26 0701 - 02/27 0700 In: 248.4 [IV Piggyback:248.4] Out: 1 [Stool:1] Intake/Output this shift: No intake/output data recorded.  General appearance: alert and cooperative Resp: no increased WOB Cardio: regular rate GI: tender in the LLQ, non-distended Extremities: no BLE edema  Lab Results: Recent Labs    04/17/22 0158 04/18/22 0754 04/19/22 0657  WBC 13.1* 13.6* 13.0*  HGB 10.6* 9.8* 9.3*  HCT 33.3* 31.5* 29.1*  PLT 353 345 321   BMET Recent Labs    04/17/22 0158 04/18/22 0754 04/19/22 0657  NA 134* 137 133*  K 3.1* 3.7 3.5  CL 96* 100 99  CO2 '28 25 25  '$ GLUCOSE 112* 94 104*  BUN '8 8 9  '$ CREATININE 0.79 0.81 0.76  CALCIUM 8.4* 8.2* 8.3*   LFT Recent Labs    04/16/22 1749  PROT 6.5  ALBUMIN 3.3*  AST 14*  ALT 8  ALKPHOS 111  BILITOT 0.6   PT/INR Recent Labs    04/19/22 0657  LABPROT 13.6  INR 1.1   Hepatitis Panel No results for input(s): "HEPBSAG", "HCVAB", "HEPAIGM", "HEPBIGM" in the last 72 hours. C-Diff No results for input(s): "CDIFFTOX" in the last 72 hours.  Studies/Results: ECHOCARDIOGRAM COMPLETE  Result Date: 04/19/2022    ECHOCARDIOGRAM REPORT   Patient Name:   Monica Pugh Date of Exam: 04/19/2022 Medical Rec #:  [id number]  Height:       63.0 in Accession #:    [id number] Weight:       185.2 lb Date of Birth:  [birth year]   BSA:          1.871 m Patient Age:    72 years    BP:           125/76 mmHg Patient Gender: F          HR:           77 bpm. Exam Location:  Inpatient Procedure: 2D Echo, Cardiac Doppler and Color Doppler Indications:    R06.9 DOE  History:        Patient has prior history of Echocardiogram examinations, most                 recent 05/10/2015. CHF, PAD, Stroke and Carotid Disease; Risk                 Factors:Hypertension and Current Smoker.  Sonographer:    Wilkie Aye RVT RCS Referring Phys: [id number] [place]  1. Left ventricular ejection fraction, by estimation, is 65 to 70%. The left ventricle has normal function. The left ventricle has no regional wall motion abnormalities. There is mild concentric left ventricular hypertrophy. Left ventricular diastolic parameters are consistent with Grade II diastolic dysfunction (pseudonormalization). There is the interventricular septum is flattened in systole, consistent with right ventricular pressure overload.  2. Right ventricular systolic function is normal. The right ventricular size is  normal. There is moderately elevated pulmonary artery systolic pressure. The estimated right ventricular systolic pressure is [id number] mmHg.  3. Left atrial size was mildly dilated.  4. The mitral valve is normal in structure. Mild to moderate mitral valve regurgitation. No evidence of mitral stenosis.  5. The aortic valve is tricuspid. There is mild thickening of the aortic valve. Aortic valve regurgitation is not visualized. Aortic valve sclerosis is present, with no evidence of aortic valve stenosis.  6. There is systolic notching of the pulmonary systolic Doppler signal, consistent with increased pulmonary vascular resistance.  7. The inferior vena cava is dilated in size with >50% respiratory variability, suggesting right atrial pressure of 8 mmHg. Comparison(s): Prior images reviewed side by side. Changes from prior study are noted. The systolic pulmonary artery pressure is higher. FINDINGS  Left Ventricle: Left  ventricular ejection fraction, by estimation, is 65 to 70%. The left ventricle has normal function. The left ventricle has no regional wall motion abnormalities. The left ventricular internal cavity size was normal in size. There is  mild concentric left ventricular hypertrophy. The interventricular septum is flattened in systole, consistent with right ventricular pressure overload. Left ventricular diastolic parameters are consistent with Grade II diastolic dysfunction (pseudonormalization). Normal left ventricular filling pressure. Right Ventricle: The right ventricular size is normal. No increase in right ventricular wall thickness. Right ventricular systolic function is normal. There is moderately elevated pulmonary artery systolic pressure. The tricuspid regurgitant velocity is 3.42 m/s, and with an assumed right atrial pressure of 8 mmHg, the estimated right ventricular systolic pressure is [id number] mmHg. Left Atrium: Left atrial size was mildly dilated. Right Atrium: Right atrial size was normal in size. Pericardium: There is no evidence of pericardial effusion. Mitral Valve: The mitral valve is normal in structure. Mild mitral annular calcification. Mild to moderate mitral valve regurgitation, with posteriorly-directed jet. No evidence of mitral valve stenosis. Tricuspid Valve: The tricuspid valve is normal in structure. Tricuspid valve regurgitation is mild. Aortic Valve: The aortic valve is tricuspid. There is mild thickening of the aortic valve. Aortic valve regurgitation is not visualized. Aortic valve sclerosis is present, with no evidence of aortic valve stenosis. Aortic valve mean gradient measures 5.0  mmHg. Aortic valve peak gradient measures 10.1 mmHg. Aortic valve area, by VTI measures 1.80 cm. Pulmonic Valve: There is systolic notching of the pulmonary systolic Doppler signal, consistent with increased pulmonary vascular resistance. The pulmonic valve was normal in structure. Pulmonic valve  regurgitation is trivial. No evidence of pulmonic stenosis. Aorta: The aortic root and ascending aorta are structurally normal, with no evidence of dilitation. Venous: The inferior vena cava is dilated in size with greater than 50% respiratory variability, suggesting right atrial pressure of 8 mmHg. IAS/Shunts: No atrial level shunt detected by color flow Doppler.  LEFT VENTRICLE PLAX 2D LVIDd:         4.20 cm   Diastology LVIDs:         2.90 cm   LV e' medial:    7.05 cm/s LV PW:         1.30 cm   LV E/e' medial:  22.0 LV IVS:        1.30 cm   LV e' lateral:   6.00 cm/s LVOT diam:     1.80 cm   LV E/e' lateral: 25.8 LV SV:         59 LV SV Index:   31 LVOT Area:     2.54 cm  RIGHT VENTRICLE  IVC RV Basal diam:  2.80 cm     IVC diam: 2.10 cm RV S prime:     15.20 cm/s TAPSE (M-mode): 3.0 cm LEFT ATRIUM             Index        RIGHT ATRIUM           Index LA diam:        3.20 cm 1.71 cm/m   RA Area:     14.30 cm LA Vol (A2C):   63.2 ml 33.77 ml/m  RA Volume:   34.80 ml  18.59 ml/m LA Vol (A4C):   51.2 ml 27.36 ml/m LA Biplane Vol: 63.3 ml 33.82 ml/m  AORTIC VALVE                     PULMONIC VALVE AV Area (Vmax):    1.81 cm      PV Vmax:       0.95 m/s AV Area (Vmean):   1.76 cm      PV Peak grad:  3.6 mmHg AV Area (VTI):     1.80 cm AV Vmax:           159.00 cm/s AV Vmean:          108.000 cm/s AV VTI:            0.325 m AV Peak Grad:      10.1 mmHg AV Mean Grad:      5.0 mmHg LVOT Vmax:         113.00 cm/s LVOT Vmean:        74.500 cm/s LVOT VTI:          0.230 m LVOT/AV VTI ratio: 0.71  AORTA Ao Root diam: 2.70 cm Ao Arch diam: 2.3 cm MITRAL VALVE                TRICUSPID VALVE MV Area (PHT): 2.87 cm     TR Peak grad:   46.8 mmHg MV Decel Time: 264 msec     TR Vmax:        342.00 cm/s MV E velocity: 155.00 cm/s MV A velocity: 167.00 cm/s  SHUNTS MV E/A ratio:  0.93         Systemic VTI:  0.23 m                             Systemic Diam: 1.80 cm Dani Gobble Croitoru MD Electronically signed by  Sanda Klein MD Signature Date/Time: 04/19/2022/1:21:17 PM    Final    CT CHEST WO CONTRAST  Result Date: 04/19/2022 CLINICAL DATA:  Colon cancer. Assess treatment response. Evaluate for metastatic disease. * Tracking Code: BO * EXAM: CT CHEST WITHOUT CONTRAST TECHNIQUE: Multidetector CT imaging of the chest was performed following the standard protocol without IV contrast. RADIATION DOSE REDUCTION: This exam was performed according to the departmental dose-optimization program which includes automated exposure control, adjustment of the mA and/or kV according to patient size and/or use of iterative reconstruction technique. COMPARISON:  None Available. FINDINGS: Cardiovascular: No acute cardiovascular findings on noncontrast exam. Coronary artery calcification and aortic atherosclerotic calcification. Mediastinum/Nodes: Large RIGHT lower paratracheal lymph node measures 3.3 cm. Subcarinal lymph node measures 2.1 cm. No supraclavicular or axillary adenopathy Lungs/Pleura: Ovoid nodule in the posterior RIGHT lower lobe measures 2.7 cm. This nodule may be above the imaging on recent CT of the abdomen. There is new interstitial thickening at  the LEFT and RIGHT lung base compared to prior. One nodule in the LEFT lower lobe measuring 11 mm (image 99/4 is increased from 8 mm on recent exam. Small effusions. No upper lobe nodularity Upper Abdomen: No abnormality on noncontrast exam Musculoskeletal: No aggressive osseous lesion. IMPRESSION: 1. Bulky mediastinal nodal metastasis. 2. Oblong nodule in the RIGHT lower lobe increase in size of LEFT lower lobe nodule over short interval. Findings favor bilateral lower lobe nodular metastasis. 3. New bibasilar interstitial thickening over a 3 day interval. Concern for rapid progression lymphangitic spread versus atelectasis and interstitial thickening related to recent anaesthesia for colonoscopy. Favor benign interstitial thickening. Electronically Signed   By: Suzy Bouchard M.D.   On: 04/19/2022 08:58    Medications: I have reviewed the patient's current medications. Scheduled:  aspirin  81 mg Oral Daily   atorvastatin  40 mg Oral q AM   cholecalciferol  5,000 Units Oral Daily   feeding supplement  1 Container Oral TID BM   ferrous sulfate  325 mg Oral Q breakfast   guaiFENesin  1,200 mg Oral BID   ipratropium-albuterol  3 mL Nebulization BID   pantoprazole (PROTONIX) IV  40 mg Intravenous Q12H   polyethylene glycol  17 g Oral Daily   potassium chloride  40 mEq Oral Daily   Continuous:  magnesium sulfate bolus IVPB     piperacillin-tazobactam (ZOSYN)  IV 3.375 g (04/19/22 [id number])   [id number] **OR** acetaminophen, hydrALAZINE, ipratropium-albuterol, morphine injection, naLOXone (NARCAN)  injection, ondansetron (ZOFRAN) IV  Colonoscopy 04/18/22:   Assessment/Plan: 72 year old female with history of endometrial cancer, carotid artery occlusion, GERD, CVA on Plavix, OSA, PVD presents with abdominal pain and change in bowel habits, found to have circumferential wall thickening in the sigmoid colon on CT scan. Colonoscopy showed a sigmoid colon mass that was biopsied. The area distal the mass was tattooed. Pathology confirmed adenocarcinoma. I relayed the results of her pathology to the patient and colorectal surgery. Chest CT showed bulky mediastinal nodal metastasis and nodules in the lungs that is favored to be metastasis. Colorectal surgery is following. Plan is to get medical oncology on board. GI will sign off for now. Please call if any new questions arise.   LOS: 2 days   Monica Pugh 04/19/2022, 2:16 PM

## 2022-04-19 NOTE — Progress Notes (Signed)
Echocardiogram reviewed.  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  Repeat carotid vascular ultrasound showed unchanged velocities within the left carotid stent.  There may be new subclavian stenoses bilaterally.  Neither of these findings support necessarily increase perioperative cardiac risk.  Cardiac risk elevated biology of her underlying comorbidities, but certainly not prohibitive reasonable to proceed with sigmoid resection, if deemed necessary.  Cardiology will sign off.    Nigel Mormon, MD Pager: [phone] Office: [phone]

## 2022-04-19 NOTE — Progress Notes (Signed)
1 Day Post-Op  Subjective: CC: Tolerating cld yesterday without n/v. Still some llq pain. Multiple (~6) liquid bm's since I saw her yesterday.   Objective: Vital signs in last 24 hours: Temp:  [96.8 F (36 C)-99.1 F (37.3 C)] 97.8 F (36.6 C) (02/27 0435) Pulse Rate:  [68-96] 81 (02/27 0435) Resp:  [16-24] 20 (02/27 0435) BP: (113-167)/(53-72) 161/55 (02/27 0435) SpO2:  [91 %-96 %] 91 % (02/27 0435) Weight:  [84 kg] 84 kg (02/27 0434) Last BM Date : 04/18/22  Intake/Output from previous day: 02/26 0701 - 02/27 0700 In: 248.4 [IV Piggyback:248.4] Out: 1 [Stool:1] Intake/Output this shift: No intake/output data recorded.  PE: Gen:  Alert, NAD, pleasant Card:  Reg Pulm:  L > R rhonchi. Normal rate and effort. On o2 Abd: Soft, ND, some ttp of the LLQ without rigidity or guarding. +BS.  Prior robotic abdominal scars are well healed.   Psych: A&Ox3   Lab Results:  Recent Labs    04/18/22 0754 04/19/22 0657  WBC 13.6* 13.0*  HGB 9.8* 9.3*  HCT 31.5* 29.1*  PLT 345 321   BMET Recent Labs    04/18/22 0754 04/19/22 0657  NA 137 133*  K 3.7 3.5  CL 100 99  CO2 25 25  GLUCOSE 94 104*  BUN 8 9  CREATININE 0.81 0.76  CALCIUM 8.2* 8.3*   PT/INR Recent Labs    04/19/22 0657  LABPROT 13.6  INR 1.1   CMP     Component Value Date/Time   NA 133 (L) 04/19/2022 0657   K 3.5 04/19/2022 0657   CL 99 04/19/2022 0657   CO2 25 04/19/2022 0657   GLUCOSE 104 (H) 04/19/2022 0657   BUN 9 04/19/2022 0657   CREATININE 0.76 04/19/2022 0657   CALCIUM 8.3 (L) 04/19/2022 0657   PROT 6.5 04/16/2022 1749   ALBUMIN 3.3 (L) 04/16/2022 1749   AST 14 (L) 04/16/2022 1749   ALT 8 04/16/2022 1749   ALKPHOS 111 04/16/2022 1749   BILITOT 0.6 04/16/2022 1749   GFRNONAA >60 04/19/2022 0657   GFRAA >60 05/21/2015 0424   Lipase     Component Value Date/Time   LIPASE 21 04/16/2022 1749    Studies/Results: No results found.  Anti-infectives: Anti-infectives (From  admission, onward)    Start     Dose/Rate Route Frequency Ordered Stop   04/16/22 2130  piperacillin-tazobactam (ZOSYN) IVPB 3.375 g        3.375 g 12.5 mL/hr over 240 Minutes Intravenous Every 8 hours 04/16/22 2126          Assessment/Plan Sigmoid Colon Mass - CT showed mid sigmoid colon mass with infiltration of the sigmoid mesocolon + associated pathologic adenopathy. Colonoscopy with partially obstructing sigmoid colon mass involving 2/3 of the lumen circumference but was unable to be traversed with ultraslim colonoscope. Area of distal mass was tattooed.  - While she is not clinically obstructed and denies any hematochezia/melena with stable hgb I fear if she would not tolerate po's past liquids to get adequate nutrition at home. Given this, along with CT scan findings, would recommended sigmoid resection while inpatient. Will need to coordinate with urology for stents given hx of ureterolysis during her TAH/BSO last year.  - Colonoscopy Bx pending. - CEA 2.3. To note she did have elevated CA 125 of 83.5 on 2/14  - CT Chest ordered for completeness - Cardiology clearance pending (Carotid US and Echo pending). Appreciate their assistance.  - For now leave on CLD.  Cont daily miralax    FEN - Do not advance past CLD, IVF per [facility] VTE - SCDs, please hold Brilinta. ASA per Vascular. Okay for Lovenox or subq heparin ID - None Foley - None   Hx of endometrial cancer s/p robotic assisted TAH/BSO with b/l ureterolysis by Dr. Gwenlyn Found at [facility] 05/2021 (per Oncology/Dr. Federico Flake note she was recommended for adjuvant chemotherapy and XRT but declined) HLD Hx CVA on Plavix (last dose 2/20) OSA not on CPAP Tobacco use - on o2 here PVD/PAD s/p iliac stents Carotid dz s/p L stent by Dr. Estanislado Pandy 2022 with > 75% stenosis in stent at area of native bifurcation and complete occlusion R ICA on [place] 05/2021. IR angio 05/2021 with 50% intra stent stenosis of the proximal L ICA and chronic right common  carotid artery/internal carotid artery occlusion. Discussed with vascular surgery who recommended ASA while off Brilinta and f/u US. They suspect there is nothing that will need to be done pre-op.   I reviewed nursing notes, Consultant (cardiology) notes, last 24 h vitals and pain scores, last 48 h intake and output, last 24 h labs and trends, and last 24 h imaging results.  LOS: 2 days    Jillyn Ledger , [facility] 04/19/2022, 7:58 AM Please see Amion for pager number during day hours 7:00am-4:30pm

## 2022-04-19 NOTE — Progress Notes (Signed)
Carotid duplex bilateral study completed.   Please see CV Proc for preliminary results.   Undray Allman, RDMS, RVT  

## 2022-04-19 NOTE — Progress Notes (Signed)
Carotid duplex bilateral routine study attempted. Patient with MD in room at this time. Will attempt again as schedule and patient availability permits.   Darlin Coco, RDMS, RVT

## 2022-04-20 ENCOUNTER — Other Ambulatory Visit: Payer: Self-pay

## 2022-04-20 ENCOUNTER — Ambulatory Visit: Payer: Medicare HMO | Admitting: Oncology

## 2022-04-20 DIAGNOSIS — E876 Hypokalemia: Secondary | ICD-10-CM | POA: Diagnosis not present

## 2022-04-20 DIAGNOSIS — R112 Nausea with vomiting, unspecified: Secondary | ICD-10-CM | POA: Diagnosis not present

## 2022-04-20 DIAGNOSIS — K6389 Other specified diseases of intestine: Secondary | ICD-10-CM | POA: Diagnosis not present

## 2022-04-20 DIAGNOSIS — K219 Gastro-esophageal reflux disease without esophagitis: Secondary | ICD-10-CM

## 2022-04-20 NOTE — Plan of Care (Signed)
  Problem: Clinical Measurements: Goal: Will remain free from infection Outcome: Progressing   Problem: Activity: Goal: Risk for activity intolerance will decrease Outcome: Progressing   Problem: Coping: Goal: Level of anxiety will decrease Outcome: Progressing   

## 2022-04-20 NOTE — Progress Notes (Signed)
2 Days Post-Op  Subjective: CC: Tolerating cld yesterday without n/v. Still some llq pain. Multiple (~7) liquid bm's since I saw her yesterday.   Main complaint is sob.  Husband at bedside.   Objective: Vital signs in last 24 hours: Temp:  [98.4 F (36.9 C)-99.1 F (37.3 C)] 99.1 F (37.3 C) (02/28 0354) Pulse Rate:  [74-85] 74 (02/28 0716) Resp:  [16-21] 16 (02/28 0716) BP: (139-183)/(54-61) 139/54 (02/28 0354) SpO2:  [97 %-98 %] 98 % (02/28 0354) FiO2 (%):  [28 %] 28 % (02/28 0716) Weight:  [85.1 kg] 85.1 kg (02/28 0354) Last BM Date : 04/19/22  Intake/Output from previous day: 02/27 0701 - 02/28 0700 In: 240 [P.O.:240] Out: -  Intake/Output this shift: No intake/output data recorded.  PE: Gen:  Alert, NAD, pleasant Card:  Reg Pulm:  Some rhonchi with expiratory wheezing. Normal rate and effort. On o2 Abd: Soft, ND, some ttp of the LLQ without rigidity or guarding. +BS.  Psych: A&Ox3   Lab Results:  Recent Labs    04/18/22 0754 04/19/22 0657  WBC 13.6* 13.0*  HGB 9.8* 9.3*  HCT 31.5* 29.1*  PLT 345 321    BMET Recent Labs    04/18/22 0754 04/19/22 0657  NA 137 133*  K 3.7 3.5  CL 100 99  CO2 25 25  GLUCOSE 94 104*  BUN 8 9  CREATININE 0.81 0.76  CALCIUM 8.2* 8.3*    PT/INR Recent Labs    04/19/22 0657  LABPROT 13.6  INR 1.1    CMP     Component Value Date/Time   NA 133 (L) 04/19/2022 0657   K 3.5 04/19/2022 0657   CL 99 04/19/2022 0657   CO2 25 04/19/2022 0657   GLUCOSE 104 (H) 04/19/2022 0657   BUN 9 04/19/2022 0657   CREATININE 0.76 04/19/2022 0657   CALCIUM 8.3 (L) 04/19/2022 0657   PROT 6.5 04/16/2022 1749   ALBUMIN 3.3 (L) 04/16/2022 1749   AST 14 (L) 04/16/2022 1749   ALT 8 04/16/2022 1749   ALKPHOS 111 04/16/2022 1749   BILITOT 0.6 04/16/2022 1749   GFRNONAA >60 04/19/2022 0657   GFRAA >60 05/21/2015 0424   Lipase     Component Value Date/Time   LIPASE 21 04/16/2022 1749    Studies/Results: VAS US  CAROTID  Result Date: 04/19/2022 Carotid Arterial Duplex Study Patient Name:  Monica Pugh  Date of Exam:   04/19/2022 Medical Rec #: [id number]   Accession #:    [id number] Date of Birth: 12-27-1950    Patient Gender: F Patient Age:   6 years Exam Location:  [facility] Procedure:      VAS US CAROTID Referring Phys: Alferd Apa --------------------------------------------------------------------------------  Indications:       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 Risk Factors:      Hypertension, hyperlipidemia, Diabetes, past history of                    smoking, prior CVA, PAD. Comparison Study:  06-07-2021 Most recent  prior carotid duplex. Performing Technologist: Darlin Coco RDMS, RVT  Examination Guidelines: A complete evaluation includes B-mode imaging, spectral Doppler, color Doppler, and power Doppler as needed of all accessible portions of each vessel. Bilateral testing is considered an integral part of a complete examination. Limited examinations for reoccurring indications may be performed as noted.  Right Carotid Findings: +----------+--------+--------+--------+------------------+--------+           PSV cm/sEDV cm/sStenosisPlaque DescriptionComments +----------+--------+--------+--------+------------------+--------+ CCA Prox                  Occluded                           +----------+--------+--------+--------+------------------+--------+ CCA Mid                   Occluded                           +----------+--------+--------+--------+------------------+--------+ CCA Distal                Occluded                           +----------+--------+--------+--------+------------------+--------+ ICA Prox                  Occluded                            +----------+--------+--------+--------+------------------+--------+ ICA Mid                   Occluded                           +----------+--------+--------+--------+------------------+--------+ ICA Distal                Occluded                           +----------+--------+--------+--------+------------------+--------+ ECA       39      10                                         +----------+--------+--------+--------+------------------+--------+ +----------+--------+-------+---------+-------------------+           PSV cm/sEDV cmsDescribe Arm Pressure (mmHG) +----------+--------+-------+---------+-------------------+ [id number]            Turbulent                    +----------+--------+-------+---------+-------------------+ +---------+--------+---+--------+--+---------+ VertebralPSV cm/s122EDV cm/s16Antegrade +---------+--------+---+--------+--+---------+  Left Carotid Findings: +----------+--------+--------+--------+------------------+--------+           PSV cm/sEDV cm/sStenosisPlaque DescriptionComments +----------+--------+--------+--------+------------------+--------+ CCA Prox  48      19                                         +----------+--------+--------+--------+------------------+--------+ CCA Distal                                          Stent    +----------+--------+--------+--------+------------------+--------+ ICA Prox  Stent    +----------+--------+--------+--------+------------------+--------+ ICA Mid                                             Stent    +----------+--------+--------+--------+------------------+--------+ ICA Distal117     40                                         +----------+--------+--------+--------+------------------+--------+ ECA       88      15                                          +----------+--------+--------+--------+------------------+--------+ +----------+--------+--------+---------+-------------------+           PSV cm/sEDV cm/sDescribe Arm Pressure (mmHG) +----------+--------+--------+---------+-------------------+ Subclavian307             Turbulent                    +----------+--------+--------+---------+-------------------+ +---------+--------+---+--------+--+---------+ VertebralPSV cm/s111EDV cm/s25Antegrade +---------+--------+---+--------+--+---------+  Left Stent(s): +--------------+---+---+------------+---------+-------------------------------+ Prox to Stent 52 19                                                      +--------------+---+---+------------+---------+-------------------------------+ Proximal Stent68 20                                                      +--------------+---+---+------------+---------+-------------------------------+ Mid Stent     [id number]>75%                 Calcific w/ posteriorior                            stenosis             shadowing                       +--------------+---+---+------------+---------+-------------------------------+ Distal Stent  15244             Turbulent                                +--------------+---+---+------------+---------+-------------------------------+ Distal to     11129                                                      Stent                                                                    +--------------+---+---+------------+---------+-------------------------------+  Summary: Right Carotid: Evidence consistent with a total occlusion of the right ICA. The                CCA appears occluded. Left Carotid: >75% stenosis is again visualized in the stent. Velocities appear               essentially unchanged as compared to previous examination. Vertebrals:  Bilateral vertebral arteries demonstrate antegrade flow. Subclavians: Bilateral  subclavian artery flow was disturbed. Increased PSV              bilaterally as compared to previous examination. *See table(s) above for measurements and observations.     Preliminary    ECHOCARDIOGRAM COMPLETE  Result Date: 04/19/2022    ECHOCARDIOGRAM REPORT   Patient Name:   Monica Pugh Date of Exam: 04/19/2022 Medical Rec #:  [id number]  Height:       63.0 in Accession #:    [id number] Weight:       185.2 lb Date of Birth:  1950/06/13   BSA:          1.871 m Patient Age:    10 years   BP:           125/76 mmHg Patient Gender: F          HR:           77 bpm. Exam Location:  Inpatient Procedure: 2D Echo, Cardiac Doppler and Color Doppler Indications:    R06.9 DOE  History:        Patient has prior history of Echocardiogram examinations, most                 recent 05/10/2015. CHF, PAD, Stroke and Carotid Disease; Risk                 Factors:Hypertension and Current Smoker.  Sonographer:    Wilkie Aye RVT RCS Referring Phys: [id number] [place]  1. Left ventricular ejection fraction, by estimation, is 65 to 70%. The left ventricle has normal function. The left ventricle has no regional wall motion abnormalities. There is mild concentric left ventricular hypertrophy. Left ventricular diastolic parameters are consistent with Grade II diastolic dysfunction (pseudonormalization). There is the interventricular septum is flattened in systole, consistent with right ventricular pressure overload.  2. Right ventricular systolic function is normal. The right ventricular size is normal. There is moderately elevated pulmonary artery systolic pressure. The estimated right ventricular systolic pressure is [id number] mmHg.  3. Left atrial size was mildly dilated.  4. The mitral valve is normal in structure. Mild to moderate mitral valve regurgitation. No evidence of mitral stenosis.  5. The aortic valve is tricuspid. There is mild thickening of the aortic valve. Aortic valve regurgitation is not visualized.  Aortic valve sclerosis is present, with no evidence of aortic valve stenosis.  6. There is systolic notching of the pulmonary systolic Doppler signal, consistent with increased pulmonary vascular resistance.  7. The inferior vena cava is dilated in size with >50% respiratory variability, suggesting right atrial pressure of 8 mmHg. Comparison(s): Prior images reviewed side by side. Changes from prior study are noted. The systolic pulmonary artery pressure is higher. FINDINGS  Left Ventricle: Left ventricular ejection fraction, by estimation, is 65 to 70%. The left ventricle has normal function. The left ventricle has no regional wall motion abnormalities. The left ventricular internal cavity size was normal in size. There is  mild concentric left ventricular hypertrophy. The interventricular septum is flattened in systole, consistent  with right ventricular pressure overload. Left ventricular diastolic parameters are consistent with Grade II diastolic dysfunction (pseudonormalization). Normal left ventricular filling pressure. Right Ventricle: The right ventricular size is normal. No increase in right ventricular wall thickness. Right ventricular systolic function is normal. There is moderately elevated pulmonary artery systolic pressure. The tricuspid regurgitant velocity is 3.42 m/s, and with an assumed right atrial pressure of 8 mmHg, the estimated right ventricular systolic pressure is [id number] mmHg. Left Atrium: Left atrial size was mildly dilated. Right Atrium: Right atrial size was normal in size. Pericardium: There is no evidence of pericardial effusion. Mitral Valve: The mitral valve is normal in structure. Mild mitral annular calcification. Mild to moderate mitral valve regurgitation, with posteriorly-directed jet. No evidence of mitral valve stenosis. Tricuspid Valve: The tricuspid valve is normal in structure. Tricuspid valve regurgitation is mild. Aortic Valve: The aortic valve is tricuspid. There is mild  thickening of the aortic valve. Aortic valve regurgitation is not visualized. Aortic valve sclerosis is present, with no evidence of aortic valve stenosis. Aortic valve mean gradient measures 5.0  mmHg. Aortic valve peak gradient measures 10.1 mmHg. Aortic valve area, by VTI measures 1.80 cm. Pulmonic Valve: There is systolic notching of the pulmonary systolic Doppler signal, consistent with increased pulmonary vascular resistance. The pulmonic valve was normal in structure. Pulmonic valve regurgitation is trivial. No evidence of pulmonic stenosis. Aorta: The aortic root and ascending aorta are structurally normal, with no evidence of dilitation. Venous: The inferior vena cava is dilated in size with greater than 50% respiratory variability, suggesting right atrial pressure of 8 mmHg. IAS/Shunts: No atrial level shunt detected by color flow Doppler.  LEFT VENTRICLE PLAX 2D LVIDd:         4.20 cm   Diastology LVIDs:         2.90 cm   LV e' medial:    7.05 cm/s LV PW:         1.30 cm   LV E/e' medial:  22.0 LV IVS:        1.30 cm   LV e' lateral:   6.00 cm/s LVOT diam:     1.80 cm   LV E/e' lateral: 25.8 LV SV:         59 LV SV Index:   31 LVOT Area:     2.54 cm  RIGHT VENTRICLE             IVC RV Basal diam:  2.80 cm     IVC diam: 2.10 cm RV S prime:     15.20 cm/s TAPSE (M-mode): 3.0 cm LEFT ATRIUM             Index        RIGHT ATRIUM           Index LA diam:        3.20 cm 1.71 cm/m   RA Area:     14.30 cm LA Vol (A2C):   63.2 ml 33.77 ml/m  RA Volume:   34.80 ml  18.59 ml/m LA Vol (A4C):   51.2 ml 27.36 ml/m LA Biplane Vol: 63.3 ml 33.82 ml/m  AORTIC VALVE                     PULMONIC VALVE AV Area (Vmax):    1.81 cm      PV Vmax:       0.95 m/s AV Area (Vmean):   1.76 cm      PV Peak grad:  3.6 mmHg AV Area (VTI):     1.80 cm AV Vmax:           159.00 cm/s AV Vmean:          108.000 cm/s AV VTI:            0.325 m AV Peak Grad:      10.1 mmHg AV Mean Grad:      5.0 mmHg LVOT Vmax:         113.00 cm/s  LVOT Vmean:        74.500 cm/s LVOT VTI:          0.230 m LVOT/AV VTI ratio: 0.71  AORTA Ao Root diam: 2.70 cm Ao Arch diam: 2.3 cm MITRAL VALVE                TRICUSPID VALVE MV Area (PHT): 2.87 cm     TR Peak grad:   46.8 mmHg MV Decel Time: 264 msec     TR Vmax:        342.00 cm/s MV E velocity: 155.00 cm/s MV A velocity: 167.00 cm/s  SHUNTS MV E/A ratio:  0.93         Systemic VTI:  0.23 m                             Systemic Diam: 1.80 cm Dani Gobble Croitoru MD Electronically signed by Sanda Klein MD Signature Date/Time: 04/19/2022/1:21:17 PM    Final    CT CHEST WO CONTRAST  Result Date: 04/19/2022 CLINICAL DATA:  Colon cancer. Assess treatment response. Evaluate for metastatic disease. * Tracking Code: BO * EXAM: CT CHEST WITHOUT CONTRAST TECHNIQUE: Multidetector CT imaging of the chest was performed following the standard protocol without IV contrast. RADIATION DOSE REDUCTION: This exam was performed according to the departmental dose-optimization program which includes automated exposure control, adjustment of the mA and/or kV according to patient size and/or use of iterative reconstruction technique. COMPARISON:  None Available. FINDINGS: Cardiovascular: No acute cardiovascular findings on noncontrast exam. Coronary artery calcification and aortic atherosclerotic calcification. Mediastinum/Nodes: Large RIGHT lower paratracheal lymph node measures 3.3 cm. Subcarinal lymph node measures 2.1 cm. No supraclavicular or axillary adenopathy Lungs/Pleura: Ovoid nodule in the posterior RIGHT lower lobe measures 2.7 cm. This nodule may be above the imaging on recent CT of the abdomen. There is new interstitial thickening at the LEFT and RIGHT lung base compared to prior. One nodule in the LEFT lower lobe measuring 11 mm (image 99/4 is increased from 8 mm on recent exam. Small effusions. No upper lobe nodularity Upper Abdomen: No abnormality on noncontrast exam Musculoskeletal: No aggressive osseous lesion.  IMPRESSION: 1. Bulky mediastinal nodal metastasis. 2. Oblong nodule in the RIGHT lower lobe increase in size of LEFT lower lobe nodule over short interval. Findings favor bilateral lower lobe nodular metastasis. 3. New bibasilar interstitial thickening over a 3 day interval. Concern for rapid progression lymphangitic spread versus atelectasis and interstitial thickening related to recent anaesthesia for colonoscopy. Favor benign interstitial thickening. Electronically Signed   By: Suzy Bouchard M.D.   On: 04/19/2022 08:58    Anti-infectives: Anti-infectives (From admission, onward)    Start     Dose/Rate Route Frequency Ordered Stop   04/16/22 2130  piperacillin-tazobactam (ZOSYN) IVPB 3.375 g        3.375 g 12.5 mL/hr over 240 Minutes Intravenous Every 8 hours 04/16/22 2126  Assessment/Plan Sigmoid Colon Mass (Adenocarcinoma) with Mets - CT showed mid sigmoid colon mass with infiltration of the sigmoid mesocolon + associated pathologic adenopathy. Colonoscopy with partially obstructing sigmoid colon mass involving 2/3 of the lumen circumference but was unable to be traversed with ultraslim colonoscope. Area of distal mass was tattooed.  - Colonoscopy Bx with Adenocarcinoma. Per discussion with attending, Path doing further eval to see if GI/GYN in origin.  - CEA 2.3. To note she did have elevated CA 125 of 83.5 on 2/14  - CT Chest with bulky mediastinal nodal mets and b/l lung nodular mets.  - Dr. Chryl Heck of Onc has seen. Per note, tx would be palliative and she cannot be cured of the advanced colon cancer. Surgery would only be palliative. No role for radiation. Considering molecular testing and outpatient chemo.  - As stated by Onc, surgery would be palliative. Given she is not obstructed clinically, will see how she does with diet advancement. Cont Miralax. If she was to develop obstructive symptoms, would likely offer loop colostomy.    FEN - FLD, shakes, IVF per [facility] VTE - SCDs,  please hold Brilinta. ASA per Vascular. Okay for Lovenox or subq heparin ID - Zosyn per primary, does not need from our standpoint.  Foley - None   Cards - Appreciate their eval/clearance. If needs surgery, cardiac risk elevated given underlying comorbidities but not prohibitive.  Hx of endometrial cancer s/p robotic assisted TAH/BSO with b/l ureterolysis by Dr. Gwenlyn Found at [place] 05/2021 (per Oncology/Dr. Federico Flake note she was recommended for adjuvant chemotherapy and XRT but declined) HLD Hx CVA on Plavix (last dose 2/20) OSA not on CPAP Tobacco use - on o2 here PVD/PAD s/p iliac stents Carotid dz s/p L stent by Dr. Estanislado Pandy 2022 with > 75% stenosis in stent at area of native bifurcation and complete occlusion R ICA on [place] 05/2021. 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 Discussed with vascular surgery who recommended ASA while off Brilinta . F/u US done.   I reviewed nursing notes, Consultant (cardiology and Onc) notes, last 24 h vitals and pain scores, last 48 h intake and output, last 24 h labs and trends, and last 24 h imaging results.  LOS: 3 days    Jillyn Ledger , [facility] Surgery 04/20/2022, 10:13 AM Please see Amion for pager number during day hours 7:00am-4:30pm

## 2022-04-20 NOTE — Progress Notes (Signed)
[facility]                                                                              Monica Pugh, is a 72 y.o. female, DOB - 08-10-1950, [id number] Admit date - 04/16/2022    Outpatient Primary MD for the patient is Bonnita Nasuti, MD  LOS - 3  days  Chief Complaint  Patient presents with   Emesis       Brief summary   Patient is a 72 year old female with CAD, CVA, endometrial CA with TAH and BSO with ureterolysis at [facility] in April 2023, carotid artery occlusion, HLP, OSA not on CPAP, PVD, GERD, bilateral hearing loss, tobacco use presented with lower abdominal pain, nausea and vomiting. CT abdomen pelvis showed circumferential wall thickening involving the mid sigmoid colon with associated hyperemia suspicious for infiltrative mass.  Patient also developed hypoxia in ED after she received 4 mg IV morphine.  Underwent colonoscopy showed partially obstructing tumor in the sigmoid colon. Not able to bypass with ultraslim colonoscope. Surgery recommend inpatient sigmoidectomy. Cardiology consulted for pre op evaluation and cleared for sigmoid resection. Found to have mediastinal bulky lymphadenopathy. Oncology consulted.  Assessment & Plan    Principal Problem: Nausea vomiting abdominal pain, new sigmoid colon mass -CT showed circumferential wall thickening involving the mid sigmoid colon with associated hyperemia suspicious for an infiltrative mass, + adenopathy within the sigmoid mesentery and left periaortic lymph node group  . -Mild leukocytosis, patient was started on IV zosyn, IV fluids, antiemetics.   -GI was consulted, underwent colonoscopy on 2/26 which showed partially obstructing tumor in the sigmoid colon, not able to bypass with ultraslim colonoscope. Nonbleeding internal hemorrhoids.  Pathology + adenocarcinoma -CT chest showed bulky mediastinal nodal metastasis, bilateral lower lobe nodular metastasis -General surgery and oncology consulted.   -2D  echo EF 65 to 70%, no regional WMA, G2 DD, moderately elevated PASP.  Carotid ultrasound shows total occlusion of right ICA, more than 75% stenosis in the stent of left carotid.  Per cardiology, increased perioperative cardiac risk, certainly not prohibitive and reasonable to proceed with sigmoid resection -Plan for moderate resection, continue CLD    Active problems Hypokalemia, hypomagnesemia -Replace as needed   Acute hypoxemia/acute hypoxic respiratory failure: In the setting of opioid use -O2 sats 99% on 2 L, wean O2 as tolerated -Cautious use of opioid use  -Chest x-ray:Low lung volumes with mild bibasilar atelectasis.    History of stroke, PVD, hyperlipidemia -Continue statin, hold aspirin and Plavix in anticipation of surgery    GERD -Continue PPI   Iron deficiency anemia: -Continue to monitor H&H, transfuse for hemoglobin less than 8   Essential hypertension -BP stable, continue IV hydralazine as needed with parameters   Obesity Estimated body mass index is 33.23 kg/m as calculated from the following:   Height as of this encounter: '5\' 3"'$  (1.6 m).   Weight as of this encounter: 85.1 kg.  Code Status: DNR DVT Prophylaxis:  Place and maintain sequential compression device Start: 04/17/22 0850 SCDs Start: 04/16/22 2335   Level of Care: Level of care: Telemetry Medical Family Communication: Updated patient's husband at the  bedside Disposition Plan:      Remains inpatient appropriate: Pending surgery  Procedures:  Colonoscopy 2D echo Carotid Dopplers  Consultants:   Gastroenterology Surgery Cardiology  Antimicrobials:   Anti-infectives (From admission, onward)    Start     Dose/Rate Route Frequency Ordered Stop   04/16/22 2130  piperacillin-tazobactam (ZOSYN) IVPB 3.375 g        3.375 g 12.5 mL/hr over 240 Minutes Intravenous Every 8 hours 04/16/22 2126            Medications  aspirin  81 mg Oral Daily   atorvastatin  40 mg Oral q AM    cholecalciferol  5,000 Units Oral Daily   feeding supplement  1 Container Oral TID BM   ferrous sulfate  325 mg Oral Q breakfast   guaiFENesin  1,200 mg Oral BID   ipratropium-albuterol  3 mL Nebulization BID   pantoprazole (PROTONIX) IV  40 mg Intravenous Q12H   polyethylene glycol  17 g Oral Daily   potassium chloride  40 mEq Oral Daily      Subjective:   Monica Pugh was seen and examined today.  Husband at the bedside.  Has lower abdominal pain, tolerating clear liquid diet.  No fevers.  No acute nausea or vomiting.   Objective:   Vitals:   04/19/22 2024 04/20/22 0024 04/20/22 0354 04/20/22 0716  BP:  (!) 169/58 (!) 139/54   Pulse: 79 75 85 74  Resp: (!) '21 20 20 16  '$ Temp:   99.1 F (37.3 C)   TempSrc:   Oral   SpO2: 97% 98% 98%   Weight:   85.1 kg   Height:        Intake/Output Summary (Last 24 hours) at 04/20/2022 0730 Last data filed at 04/19/2022 1700 Gross per 24 hour  Intake 240 ml  Output --  Net 240 ml     Wt Readings from Last 3 Encounters:  04/20/22 85.1 kg  04/06/22 82.9 kg  06/09/21 85.3 kg     Exam General: Alert and oriented x 3, NAD Cardiovascular: S1 S2 auscultated,  RRR Respiratory: Scattered rhonchi bilaterally Gastrointestinal: Soft, LLQ TTP, nondistended  + bowel sounds Ext: no pedal edema bilaterally Neuro: Strength 5/5 upper and lower extremities bilaterally Skin: No rashes Psych: Normal affect     Data Reviewed:  I have personally reviewed following labs    CBC Lab Results  Component Value Date   WBC 13.0 (H) 04/19/2022   RBC 3.82 (L) 04/19/2022   HGB 9.3 (L) 04/19/2022   HCT 29.1 (L) 04/19/2022   MCV 76.2 (L) 04/19/2022   MCH 24.3 (L) 04/19/2022   PLT 321 04/19/2022   MCHC 32.0 04/19/2022   RDW 19.6 (H) 04/19/2022   LYMPHSABS 2.5 04/16/2022   MONOABS 1.0 04/16/2022   EOSABS 0.1 04/16/2022   BASOSABS 0.1 [id number]     Last metabolic panel Lab Results  Component Value Date   NA 133 (L) 04/19/2022   K 3.5  04/19/2022   CL 99 04/19/2022   CO2 25 04/19/2022   BUN 9 04/19/2022   CREATININE 0.76 04/19/2022   GLUCOSE 104 (H) 04/19/2022   GFRNONAA >60 04/19/2022   GFRAA >60 05/21/2015   CALCIUM 8.3 (L) 04/19/2022   PHOS 3.2 04/19/2022   PROT 6.5 04/16/2022   ALBUMIN 3.3 (L) 04/16/2022   BILITOT 0.6 04/16/2022   ALKPHOS 111 04/16/2022   AST 14 (L) 04/16/2022   ALT 8 04/16/2022   ANIONGAP 9 04/19/2022  CBG (last 3)  No results for input(s): "GLUCAP" in the last 72 hours.    Coagulation Profile: Recent Labs  Lab 04/19/22 0657  INR 1.1     Radiology Studies: I have personally reviewed the imaging studies  VAS US CAROTID  Result Date: 04/19/2022 Carotid Arterial Duplex Study Patient Name:  Monica Pugh  Date of Exam:   04/19/2022 Medical Rec #: [id number]   Accession #:    [id number] Date of Birth: 1951-02-10    Patient Gender: F Patient Age:   66 years Exam Location:  [facility] Procedure:      VAS US CAROTID Referring Phys: Alferd Apa --------------------------------------------------------------------------------  Indications:       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 Risk Factors:      Hypertension, hyperlipidemia, Diabetes, past history of                    smoking, prior CVA, PAD. Comparison Study:  06-07-2021 Most recent prior carotid duplex. Performing Technologist: Darlin Coco RDMS, RVT  Examination Guidelines: A complete evaluation includes B-mode imaging, spectral Doppler, color Doppler, and power Doppler as needed of all accessible portions of each vessel. Bilateral testing is considered an integral part of a complete examination. Limited examinations for reoccurring indications may be performed as noted.  Right Carotid Findings:  +----------+--------+--------+--------+------------------+--------+           PSV cm/sEDV cm/sStenosisPlaque DescriptionComments +----------+--------+--------+--------+------------------+--------+ CCA Prox                  Occluded                           +----------+--------+--------+--------+------------------+--------+ CCA Mid                   Occluded                           +----------+--------+--------+--------+------------------+--------+ CCA Distal                Occluded                           +----------+--------+--------+--------+------------------+--------+ ICA Prox                  Occluded                           +----------+--------+--------+--------+------------------+--------+ ICA Mid                   Occluded                           +----------+--------+--------+--------+------------------+--------+ ICA Distal                Occluded                           +----------+--------+--------+--------+------------------+--------+  ECA       39      10                                         +----------+--------+--------+--------+------------------+--------+ +----------+--------+-------+---------+-------------------+           PSV cm/sEDV cmsDescribe Arm Pressure (mmHG) +----------+--------+-------+---------+-------------------+ [id number]            Turbulent                    +----------+--------+-------+---------+-------------------+ +---------+--------+---+--------+--+---------+ VertebralPSV cm/s122EDV cm/s16Antegrade +---------+--------+---+--------+--+---------+  Left Carotid Findings: +----------+--------+--------+--------+------------------+--------+           PSV cm/sEDV cm/sStenosisPlaque DescriptionComments +----------+--------+--------+--------+------------------+--------+ CCA Prox  48      19                                          +----------+--------+--------+--------+------------------+--------+ CCA Distal                                          Stent    +----------+--------+--------+--------+------------------+--------+ ICA Prox                                            Stent    +----------+--------+--------+--------+------------------+--------+ ICA Mid                                             Stent    +----------+--------+--------+--------+------------------+--------+ ICA Distal117     40                                         +----------+--------+--------+--------+------------------+--------+ ECA       88      15                                         +----------+--------+--------+--------+------------------+--------+ +----------+--------+--------+---------+-------------------+           PSV cm/sEDV cm/sDescribe Arm Pressure (mmHG) +----------+--------+--------+---------+-------------------+ Subclavian307             Turbulent                    +----------+--------+--------+---------+-------------------+ +---------+--------+---+--------+--+---------+ VertebralPSV cm/s111EDV cm/s25Antegrade +---------+--------+---+--------+--+---------+  Left Stent(s): +--------------+---+---+------------+---------+-------------------------------+ Prox to Stent 52 19                                                      +--------------+---+---+------------+---------+-------------------------------+ Proximal Stent68 20                                                      +--------------+---+---+------------+---------+-------------------------------+  Mid Stent     415114>75%                 Calcific w/ posteriorior                            stenosis             shadowing                       +--------------+---+---+------------+---------+-------------------------------+ Distal Stent  15244             Turbulent                                 +--------------+---+---+------------+---------+-------------------------------+ Distal to     11129                                                      Stent                                                                    +--------------+---+---+------------+---------+-------------------------------+    Summary: Right Carotid: Evidence consistent with a total occlusion of the right ICA. The                CCA appears occluded. Left Carotid: >75% stenosis is again visualized in the stent. Velocities appear               essentially unchanged as compared to previous examination. Vertebrals:  Bilateral vertebral arteries demonstrate antegrade flow. Subclavians: Bilateral subclavian artery flow was disturbed. Increased PSV              bilaterally as compared to previous examination. *See table(s) above for measurements and observations.     Preliminary    ECHOCARDIOGRAM COMPLETE  Result Date: 04/19/2022    ECHOCARDIOGRAM REPORT   Patient Name:   Monica Pugh Date of Exam: 04/19/2022 Medical Rec #:  [id number]  Height:       63.0 in Accession #:    [id number] Weight:       185.2 lb Date of Birth:  1950/09/22   BSA:          1.871 m Patient Age:    8 years   BP:           125/76 mmHg Patient Gender: F          HR:           77 bpm. Exam Location:  Inpatient Procedure: 2D Echo, Cardiac Doppler and Color Doppler Indications:    R06.9 DOE  History:        Patient has prior history of Echocardiogram examinations, most                 recent 05/10/2015. CHF, PAD, Stroke and Carotid Disease; Risk                 Factors:Hypertension and Current Smoker.  Sonographer:    Wilkie Aye RVT RCS Referring Phys: [phone] [facility]  J PATWARDHAN IMPRESSIONS  1. Left ventricular ejection fraction, by estimation, is 65 to 70%. The left ventricle has normal function. The left ventricle has no regional wall motion abnormalities. There is mild concentric left ventricular hypertrophy. Left ventricular diastolic parameters are  consistent with Grade II diastolic dysfunction (pseudonormalization). There is the interventricular septum is flattened in systole, consistent with right ventricular pressure overload.  2. Right ventricular systolic function is normal. The right ventricular size is normal. There is moderately elevated pulmonary artery systolic pressure. The estimated right ventricular systolic pressure is XX123456 mmHg.  3. Left atrial size was mildly dilated.  4. The mitral valve is normal in structure. Mild to moderate mitral valve regurgitation. No evidence of mitral stenosis.  5. The aortic valve is tricuspid. There is mild thickening of the aortic valve. Aortic valve regurgitation is not visualized. Aortic valve sclerosis is present, with no evidence of aortic valve stenosis.  6. There is systolic notching of the pulmonary systolic Doppler signal, consistent with increased pulmonary vascular resistance.  7. The inferior vena cava is dilated in size with >50% respiratory variability, suggesting right atrial pressure of 8 mmHg. Comparison(s): Prior images reviewed side by side. Changes from prior study are noted. The systolic pulmonary artery pressure is higher. FINDINGS  Left Ventricle: Left ventricular ejection fraction, by estimation, is 65 to 70%. The left ventricle has normal function. The left ventricle has no regional wall motion abnormalities. The left ventricular internal cavity size was normal in size. There is  mild concentric left ventricular hypertrophy. The interventricular septum is flattened in systole, consistent with right ventricular pressure overload. Left ventricular diastolic parameters are consistent with Grade II diastolic dysfunction (pseudonormalization). Normal left ventricular filling pressure. Right Ventricle: The right ventricular size is normal. No increase in right ventricular wall thickness. Right ventricular systolic function is normal. There is moderately elevated pulmonary artery systolic pressure.  The tricuspid regurgitant velocity is 3.42 m/s, and with an assumed right atrial pressure of 8 mmHg, the estimated right ventricular systolic pressure is [id number] mmHg. Left Atrium: Left atrial size was mildly dilated. Right Atrium: Right atrial size was normal in size. Pericardium: There is no evidence of pericardial effusion. Mitral Valve: The mitral valve is normal in structure. Mild mitral annular calcification. Mild to moderate mitral valve regurgitation, with posteriorly-directed jet. No evidence of mitral valve stenosis. Tricuspid Valve: The tricuspid valve is normal in structure. Tricuspid valve regurgitation is mild. Aortic Valve: The aortic valve is tricuspid. There is mild thickening of the aortic valve. Aortic valve regurgitation is not visualized. Aortic valve sclerosis is present, with no evidence of aortic valve stenosis. Aortic valve mean gradient measures 5.0  mmHg. Aortic valve peak gradient measures 10.1 mmHg. Aortic valve area, by VTI measures 1.80 cm. Pulmonic Valve: There is systolic notching of the pulmonary systolic Doppler signal, consistent with increased pulmonary vascular resistance. The pulmonic valve was normal in structure. Pulmonic valve regurgitation is trivial. No evidence of pulmonic stenosis. Aorta: The aortic root and ascending aorta are structurally normal, with no evidence of dilitation. Venous: The inferior vena cava is dilated in size with greater than 50% respiratory variability, suggesting right atrial pressure of 8 mmHg. IAS/Shunts: No atrial level shunt detected by color flow Doppler.  LEFT VENTRICLE PLAX 2D LVIDd:         4.20 cm   Diastology LVIDs:         2.90 cm   LV e' medial:    7.05 cm/s LV PW:  1.30 cm   LV E/e' medial:  22.0 LV IVS:        1.30 cm   LV e' lateral:   6.00 cm/s LVOT diam:     1.80 cm   LV E/e' lateral: 25.8 LV SV:         59 LV SV Index:   31 LVOT Area:     2.54 cm  RIGHT VENTRICLE             IVC RV Basal diam:  2.80 cm     IVC diam: 2.10 cm  RV S prime:     15.20 cm/s TAPSE (M-mode): 3.0 cm LEFT ATRIUM             Index        RIGHT ATRIUM           Index LA diam:        3.20 cm 1.71 cm/m   RA Area:     14.30 cm LA Vol (A2C):   63.2 ml 33.77 ml/m  RA Volume:   34.80 ml  18.59 ml/m LA Vol (A4C):   51.2 ml 27.36 ml/m LA Biplane Vol: 63.3 ml 33.82 ml/m  AORTIC VALVE                     PULMONIC VALVE AV Area (Vmax):    1.81 cm      PV Vmax:       0.95 m/s AV Area (Vmean):   1.76 cm      PV Peak grad:  3.6 mmHg AV Area (VTI):     1.80 cm AV Vmax:           159.00 cm/s AV Vmean:          108.000 cm/s AV VTI:            0.325 m AV Peak Grad:      10.1 mmHg AV Mean Grad:      5.0 mmHg LVOT Vmax:         113.00 cm/s LVOT Vmean:        74.500 cm/s LVOT VTI:          0.230 m LVOT/AV VTI ratio: 0.71  AORTA Ao Root diam: 2.70 cm Ao Arch diam: 2.3 cm MITRAL VALVE                TRICUSPID VALVE MV Area (PHT): 2.87 cm     TR Peak grad:   46.8 mmHg MV Decel Time: 264 msec     TR Vmax:        342.00 cm/s MV E velocity: 155.00 cm/s MV A velocity: 167.00 cm/s  SHUNTS MV E/A ratio:  0.93         Systemic VTI:  0.23 m                             Systemic Diam: 1.80 cm Dani Gobble Croitoru MD Electronically signed by Sanda Klein MD Signature Date/Time: 04/19/2022/1:21:17 PM    Final    CT CHEST WO CONTRAST  Result Date: 04/19/2022 CLINICAL DATA:  Colon cancer. Assess treatment response. Evaluate for metastatic disease. * Tracking Code: BO * EXAM: CT CHEST WITHOUT CONTRAST TECHNIQUE: Multidetector CT imaging of the chest was performed following the standard protocol without IV contrast. RADIATION DOSE REDUCTION: This exam was performed according to the departmental dose-optimization program which includes automated exposure control, adjustment of the mA and/or  kV according to patient size and/or use of iterative reconstruction technique. COMPARISON:  None Available. FINDINGS: Cardiovascular: No acute cardiovascular findings on noncontrast exam. Coronary artery  calcification and aortic atherosclerotic calcification. Mediastinum/Nodes: Large RIGHT lower paratracheal lymph node measures 3.3 cm. Subcarinal lymph node measures 2.1 cm. No supraclavicular or axillary adenopathy Lungs/Pleura: Ovoid nodule in the posterior RIGHT lower lobe measures 2.7 cm. This nodule may be above the imaging on recent CT of the abdomen. There is new interstitial thickening at the LEFT and RIGHT lung base compared to prior. One nodule in the LEFT lower lobe measuring 11 mm (image 99/4 is increased from 8 mm on recent exam. Small effusions. No upper lobe nodularity Upper Abdomen: No abnormality on noncontrast exam Musculoskeletal: No aggressive osseous lesion. IMPRESSION: 1. Bulky mediastinal nodal metastasis. 2. Oblong nodule in the RIGHT lower lobe increase in size of LEFT lower lobe nodule over short interval. Findings favor bilateral lower lobe nodular metastasis. 3. New bibasilar interstitial thickening over a 3 day interval. Concern for rapid progression lymphangitic spread versus atelectasis and interstitial thickening related to recent anaesthesia for colonoscopy. Favor benign interstitial thickening. Electronically Signed   By: Suzy Bouchard M.D.   On: 04/19/2022 08:58       Monica Pugh M.D. [facility] 04/20/2022, 7:30 AM  Available via Epic secure chat 7am-7pm After 7 pm, please refer to night coverage provider listed on amion.

## 2022-04-20 NOTE — Progress Notes (Signed)
Mobility Specialist - Progress Note   04/20/22 0800  Mobility  Activity Ambulated independently in hallway  Level of Assistance Modified independent, requires aide device or extra time  Assistive Device None  Distance Ambulated (ft) 300 ft  Activity Response Tolerated well  Mobility Referral Yes  $Mobility charge 1 Mobility    Pt received in bed and agreeable. No complaints on walk, no physical assistance needed throughout. Left in bed w/ all needs met.   [place] Specialist Please contact via SecureChat or Rehab office at [phone]

## 2022-04-20 NOTE — Care Management Important Message (Signed)
Important Message  Patient Details  Name: Monica Pugh MRN: [id number] Date of Birth: Jul 10, 1950   Medicare Important Message Given:  Yes     Shelda Altes 04/20/2022, 8:06 AM

## 2022-04-20 NOTE — Progress Notes (Signed)
Email request to [facility] pathology department requesting MMR testing on accession [id number], biopsy date 04/18/2022.

## 2022-04-21 ENCOUNTER — Ambulatory Visit: Payer: Medicare HMO

## 2022-04-21 DIAGNOSIS — R0902 Hypoxemia: Secondary | ICD-10-CM

## 2022-04-21 DIAGNOSIS — K6389 Other specified diseases of intestine: Secondary | ICD-10-CM | POA: Diagnosis not present

## 2022-04-21 DIAGNOSIS — I739 Peripheral vascular disease, unspecified: Secondary | ICD-10-CM

## 2022-04-21 DIAGNOSIS — R112 Nausea with vomiting, unspecified: Secondary | ICD-10-CM | POA: Diagnosis not present

## 2022-04-21 DIAGNOSIS — K219 Gastro-esophageal reflux disease without esophagitis: Secondary | ICD-10-CM | POA: Diagnosis not present

## 2022-04-21 DIAGNOSIS — E876 Hypokalemia: Secondary | ICD-10-CM | POA: Diagnosis not present

## 2022-04-21 LAB — BASIC METABOLIC PANEL
Anion gap: 11 (ref 5–15)
BUN: 8 mg/dL (ref 8–23)
CO2: 25 mmol/L (ref 22–32)
Calcium: 8.4 mg/dL — ABNORMAL LOW (ref 8.9–10.3)
Chloride: 95 mmol/L — ABNORMAL LOW (ref 98–111)
Creatinine, Ser: 0.75 mg/dL (ref 0.44–1.00)
GFR, Estimated: >60 mL/min (ref >60)
Glucose, Bld: 108 mg/dL — ABNORMAL HIGH (ref 70–99)
Potassium: 3.8 mmol/L (ref 3.5–5.1)
Sodium: 131 mmol/L — ABNORMAL LOW (ref 135–145)

## 2022-04-21 MED ORDER — OXYCODONE HCL 5 MG PO TABS: 5.0000 mg | ORAL_TABLET | ORAL | Status: DC | PRN

## 2022-04-21 MED ORDER — OXYCODONE HCL 5 MG PO TABS
5.0000 mg | ORAL_TABLET | ORAL | Status: DC | PRN
  Administered 2022-04-21 – 2022-04-25 (×18): 5 mg via ORAL
  Filled 2022-04-21 (×18): qty 1

## 2022-04-21 NOTE — Plan of Care (Signed)

## 2022-04-21 NOTE — Progress Notes (Signed)
Mobility Specialist - Progress Note   04/21/22 0900  Mobility  Activity Ambulated independently in hallway  Level of Assistance Modified independent, requires aide device or extra time  Assistive Device None  Distance Ambulated (ft) 300 ft  Activity Response Tolerated well  Mobility Referral Yes  $Mobility charge 1 Mobility    Pt received in bed and agreeable. No complaints on walk, no physical assistance needed. Left in bed w/ call bell in reach and all needs met.   [place] Specialist Please contact via SecureChat or Rehab office at [phone]

## 2022-04-21 NOTE — Progress Notes (Signed)
[facility]                                                                              Monica Pugh, is a 72 y.o. female, DOB - 1950-12-07, [id number] Admit date - 04/16/2022    Outpatient Primary MD for the patient is Bonnita Nasuti, MD  LOS - 4  days  Chief Complaint  Patient presents with   Emesis       Brief summary   Patient is a 72 year old female with CAD, CVA, endometrial CA with TAH and BSO with ureterolysis at [facility] in April 2023, carotid artery occlusion, HLP, OSA not on CPAP, PVD, GERD, bilateral hearing loss, tobacco use presented with lower abdominal pain, nausea and vomiting. CT abdomen pelvis showed circumferential wall thickening involving the mid sigmoid colon with associated hyperemia suspicious for infiltrative mass.  Patient also developed hypoxia in ED after she received 4 mg IV morphine.  Underwent colonoscopy showed partially obstructing tumor in the sigmoid colon. Not able to bypass with ultraslim colonoscope. Surgery recommend inpatient sigmoidectomy. Cardiology consulted for pre op evaluation and cleared for sigmoid resection. Found to have mediastinal bulky lymphadenopathy. Oncology consulted.   Assessment & Plan    Principal Problem: Nausea vomiting abdominal pain, new sigmoid colon mass -CT showed circumferential wall thickening involving the mid sigmoid colon with associated hyperemia suspicious for an infiltrative mass, + adenopathy within the sigmoid mesentery and left periaortic lymph node group  . -Mild leukocytosis, patient was started on IV zosyn, IV fluids, antiemetics PRN.   -GI was consulted, underwent colonoscopy on 2/26 which showed partially obstructing tumor in the sigmoid colon, not able to bypass with ultraslim colonoscope. Nonbleeding internal hemorrhoids.  - Pathology + adenocarcinoma -CT chest showed bulky mediastinal nodal metastasis, bilateral lower lobe nodular metastasis -General surgery and oncology consulted.    -2D echo EF 65 to 70%, no regional WMA, G2 DD, moderately elevated PASP.  Carotid ultrasound shows total occlusion of right ICA, more than 75% stenosis in the stent of left carotid.  Per cardiology, increased perioperative cardiac risk, certainly not prohibitive and reasonable to proceed with sigmoid resection -General surgery following, advancing diet, MiraLAX daily, if develops obstructive symptoms, plan for loop colostomy    Active problems Hypokalemia, hypomagnesemia -Replace as needed, f/u BMET   Acute hypoxemia/acute hypoxic respiratory failure: In the setting of opioid use -O2 sats 99% on 2 L, wean O2 as tolerated -Cautious use of opioid use  -Chest x-ray:Low lung volumes with mild bibasilar atelectasis.    History of stroke, PVD, hyperlipidemia -Continue statin, hold aspirin and Plavix in anticipation of surgery    GERD -Continue PPI   Iron deficiency anemia: -Monitor H&H, transfuse for hemoglobin  <8    Essential hypertension -BP stable, continue IV hydralazine as needed with parameters   Obesity Estimated body mass index is 33.13 kg/m as calculated from the following:   Height as of this encounter: '5\' 3"'$  (1.6 m).   Weight as of this encounter: 84.8 kg.  Code Status: DNR DVT Prophylaxis:  Place and maintain sequential compression device Start: 04/17/22 0850 SCDs Start: 04/16/22 2335   Level of Care:  Level of care: Telemetry Medical Family Communication: Updated patient's husband at the bedside Disposition Plan:      Remains inpatient appropriate: Pending surgery if develops obstructive symptoms  Procedures:  Colonoscopy 2D echo Carotid Doppler  Consultants:   Gastroenterology Surgery Cardiology  Antimicrobials:   Anti-infectives (From admission, onward)    Start     Dose/Rate Route Frequency Ordered Stop   04/16/22 2130  piperacillin-tazobactam (ZOSYN) IVPB 3.375 g        3.375 g 12.5 mL/hr over 240 Minutes Intravenous Every 8 hours 04/16/22 2126             Medications  aspirin  81 mg Oral Daily   atorvastatin  40 mg Oral q AM   cholecalciferol  5,000 Units Oral Daily   feeding supplement  1 Container Oral TID BM   ferrous sulfate  325 mg Oral Q breakfast   guaiFENesin  1,200 mg Oral BID   ipratropium-albuterol  3 mL Nebulization BID   pantoprazole (PROTONIX) IV  40 mg Intravenous Q12H   polyethylene glycol  17 g Oral Daily   potassium chloride  40 mEq Oral Daily      Subjective:   Monica Pugh was seen and examined today.  Tolerating liquid diet, Ensure, no nausea, vomiting.  Passing flatus, no BM.  Husband at the bedside.   Objective:   Vitals:   04/21/22 0007 04/21/22 0417 04/21/22 0715 04/21/22 0756  BP: (!) 157/57 (!) 126/50  (!) 154/57  Pulse:   80 87  Resp: '18 17 16 17  '$ Temp: 98.9 F (37.2 C) 98.2 F (36.8 C)  98.2 F (36.8 C)  TempSrc: Oral Oral  Oral  SpO2: 98%  98% 96%  Weight:  84.8 kg    Height:        Intake/Output Summary (Last 24 hours) at 04/21/2022 1317 Last data filed at 04/21/2022 0421 Gross per 24 hour  Intake 360 ml  Output 200 ml  Net 160 ml     Wt Readings from Last 3 Encounters:  04/21/22 84.8 kg  04/06/22 82.9 kg  06/09/21 85.3 kg    Physical Exam General: Alert and oriented x 3, NAD, hearing deficit Cardiovascular: S1 S2 clear, RRR.  Respiratory: Bilateral scattered rhonchi with wheezing. Gastrointestinal: Soft, LLQ TTP, NBS  Ext: no pedal edema bilaterally Neuro: no new deficits Psych: Normal affect     Data Reviewed:  I have personally reviewed following labs    CBC Lab Results  Component Value Date   WBC 13.0 (H) 04/19/2022   RBC 3.82 (L) 04/19/2022   HGB 9.3 (L) 04/19/2022   HCT 29.1 (L) 04/19/2022   MCV 76.2 (L) 04/19/2022   MCH 24.3 (L) 04/19/2022   PLT 321 04/19/2022   MCHC 32.0 04/19/2022   RDW 19.6 (H) 04/19/2022   LYMPHSABS 2.5 04/16/2022   MONOABS 1.0 04/16/2022   EOSABS 0.1 04/16/2022   BASOSABS 0.1 [id number]     Last metabolic  panel Lab Results  Component Value Date   NA 133 (L) 04/19/2022   K 3.5 04/19/2022   CL 99 04/19/2022   CO2 25 04/19/2022   BUN 9 04/19/2022   CREATININE 0.76 04/19/2022   GLUCOSE 104 (H) 04/19/2022   GFRNONAA >60 04/19/2022   GFRAA >60 05/21/2015   CALCIUM 8.3 (L) 04/19/2022   PHOS 3.2 04/19/2022   PROT 6.5 04/16/2022   ALBUMIN 3.3 (L) 04/16/2022   BILITOT 0.6 04/16/2022   ALKPHOS 111 04/16/2022   AST 14 (L) 04/16/2022  ALT 8 04/16/2022   ANIONGAP 9 04/19/2022    CBG (last 3)  No results for input(s): "GLUCAP" in the last 72 hours.    Coagulation Profile: Recent Labs  Lab 04/19/22 0657  INR 1.1     Radiology Studies: I have personally reviewed the imaging studies  VAS US CAROTID  Result Date: 04/20/2022 Carotid Arterial Duplex Study Patient Name:  LEOSHA VAQUERANO  Date of Exam:   04/19/2022 Medical Rec #: [id number]   Accession #:    [id number] Date of Birth: September 25, 1950    Patient Gender: F Patient Age:   53 years Exam Location:  [facility] Procedure:      VAS US CAROTID Referring Phys: Alferd Apa --------------------------------------------------------------------------------  Indications:       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 Risk Factors:      Hypertension, hyperlipidemia, Diabetes, past history of                    smoking, prior CVA, PAD. Comparison Study:  06-07-2021 Most recent prior carotid duplex. Performing Technologist: Darlin Coco RDMS, RVT  Examination Guidelines: A complete evaluation includes B-mode imaging, spectral Doppler, color Doppler, and power Doppler as needed of all accessible portions of each vessel. Bilateral testing is considered an integral part of a complete examination. Limited examinations for reoccurring indications may be performed as noted.   Right Carotid Findings: +----------+--------+--------+--------+------------------+--------+           PSV cm/sEDV cm/sStenosisPlaque DescriptionComments +----------+--------+--------+--------+------------------+--------+ CCA Prox                  Occluded                           +----------+--------+--------+--------+------------------+--------+ CCA Mid                   Occluded                           +----------+--------+--------+--------+------------------+--------+ CCA Distal                Occluded                           +----------+--------+--------+--------+------------------+--------+ ICA Prox                  Occluded                           +----------+--------+--------+--------+------------------+--------+ ICA Mid                   Occluded                           +----------+--------+--------+--------+------------------+--------+ ICA Distal                Occluded                           +----------+--------+--------+--------+------------------+--------+  ECA       39      10                                         +----------+--------+--------+--------+------------------+--------+ +----------+--------+-------+---------+-------------------+           PSV cm/sEDV cmsDescribe Arm Pressure (mmHG) +----------+--------+-------+---------+-------------------+ OF:[id number]            Turbulent                    +----------+--------+-------+---------+-------------------+ +---------+--------+---+--------+--+---------+ VertebralPSV cm/s122EDV cm/s16Antegrade +---------+--------+---+--------+--+---------+  Left Carotid Findings: +----------+--------+--------+--------+------------------+--------+           PSV cm/sEDV cm/sStenosisPlaque DescriptionComments +----------+--------+--------+--------+------------------+--------+ CCA Prox  48      19                                          +----------+--------+--------+--------+------------------+--------+ CCA Distal                                          Stent    +----------+--------+--------+--------+------------------+--------+ ICA Prox                                            Stent    +----------+--------+--------+--------+------------------+--------+ ICA Mid                                             Stent    +----------+--------+--------+--------+------------------+--------+ ICA Distal117     40                                         +----------+--------+--------+--------+------------------+--------+ ECA       88      15                                         +----------+--------+--------+--------+------------------+--------+ +----------+--------+--------+---------+-------------------+           PSV cm/sEDV cm/sDescribe Arm Pressure (mmHG) +----------+--------+--------+---------+-------------------+ Subclavian307             Turbulent                    +----------+--------+--------+---------+-------------------+ +---------+--------+---+--------+--+---------+ VertebralPSV cm/s111EDV cm/s25Antegrade +---------+--------+---+--------+--+---------+  Left Stent(s): +--------------+---+---+------------+---------+-------------------------------+ Prox to Stent 52 19                                                      +--------------+---+---+------------+---------+-------------------------------+ Proximal Stent68 20                                                      +--------------+---+---+------------+---------+-------------------------------+  Mid Stent     415114>75%                 Calcific w/ posteriorior                            stenosis             shadowing                       +--------------+---+---+------------+---------+-------------------------------+ Distal Stent  15244             Turbulent                                 +--------------+---+---+------------+---------+-------------------------------+ Distal to     11129                                                      Stent                                                                    +--------------+---+---+------------+---------+-------------------------------+    Summary: Right Carotid: Evidence consistent with a total occlusion of the right ICA. The                CCA appears occluded. Left Carotid: >75% stenosis is again visualized in the stent. Velocities appear               essentially unchanged as compared to previous examination. Vertebrals:  Bilateral vertebral arteries demonstrate antegrade flow. Subclavians: Bilateral subclavian artery flow was disturbed. Increased PSV              bilaterally as compared to previous examination. *See table(s) above for measurements and observations.  Electronically signed by Antony Contras MD on 04/20/2022 at 12:59:20 PM.    Final        Estill Cotta M.D. [facility] 04/21/2022, 1:17 PM  Available via Epic secure chat 7am-7pm After 7 pm, please refer to night coverage provider listed on amion.

## 2022-04-21 NOTE — Progress Notes (Signed)
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 She did not have a voicemail for me to leave a message. Will let her oncology team know about these results.  Hi Dr. Federico Flake and Santiago Glad, I was notified by the pathologist today that the patient's adenocarcinoma appears to be endometrial in origin. This does not appear to be colon adenocarcinoma. Would one of you be able to let the patient know?

## 2022-04-21 NOTE — Progress Notes (Signed)
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 This adenocarcinoma doesn't appear to be colon adenocarcinoma. I have let her hospitalist team know about these results.  Details of pathology report addendum are as follows: Four immunohistochemical stains are performed with adequate control to further elucidate the histogenesis of this invasive adenocarcinoma.  The tumor is positive for cytokeratin 7 and the gynecologic marker PAX8. The tumor is negative for the colonic markers cytokeratin 20 and CDX2. This immunohistochemical pattern would support a metastasis of gynecologic or endometrial origin

## 2022-04-21 NOTE — Progress Notes (Signed)
3 Days Post-Op  Subjective: CC: Tolerating fld yesterday without n/v. Still some llq pain. Requiring prn pain medication. Passing flatus. No bm yesterday.   Husband reports she is coughing up phlegm.   Husband at bedside.   Family had some additional questions for Oncology. Reached out to their team - will need to await molecular testing for more information and then they can discuss at outpatient f/u.   Objective: Vital signs in last 24 hours: Temp:  [98.2 F (36.8 C)-99.6 F (37.6 C)] 98.2 F (36.8 C) (02/29 0417) Pulse Rate:  [80-84] 80 (02/29 0715) Resp:  [16-20] 16 (02/29 0715) BP: (126-163)/(50-57) 126/50 (02/29 0417) SpO2:  [96 %-99 %] 98 % (02/29 0715) FiO2 (%):  [28 %] 28 % (02/29 0715) Weight:  [84.8 kg] 84.8 kg (02/29 0417) Last BM Date : 04/18/22  Intake/Output from previous day: 02/28 0701 - 02/29 0700 In: 600 [P.O.:600] Out: 200 [Urine:200] Intake/Output this shift: No intake/output data recorded.  PE: Gen:  Alert, NAD, pleasant Card:  Reg Pulm:  Some rhonchi with expiratory wheezing. Normal rate and effort. On o2 Abd: Soft, ND, some ttp of the LLQ without rigidity or guarding. +BS.  Psych: A&Ox3   Lab Results:  Recent Labs    04/18/22 0754 04/19/22 0657  WBC 13.6* 13.0*  HGB 9.8* 9.3*  HCT 31.5* 29.1*  PLT 345 321    BMET Recent Labs    04/18/22 0754 04/19/22 0657  NA 137 133*  K 3.7 3.5  CL 100 99  CO2 25 25  GLUCOSE 94 104*  BUN 8 9  CREATININE 0.81 0.76  CALCIUM 8.2* 8.3*    PT/INR Recent Labs    04/19/22 0657  LABPROT 13.6  INR 1.1    CMP     Component Value Date/Time   NA 133 (L) 04/19/2022 0657   K 3.5 04/19/2022 0657   CL 99 04/19/2022 0657   CO2 25 04/19/2022 0657   GLUCOSE 104 (H) 04/19/2022 0657   BUN 9 04/19/2022 0657   CREATININE 0.76 04/19/2022 0657   CALCIUM 8.3 (L) 04/19/2022 0657   PROT 6.5 04/16/2022 1749   ALBUMIN 3.3 (L) 04/16/2022 1749   AST 14 (L) 04/16/2022 1749   ALT 8 04/16/2022 1749    ALKPHOS 111 04/16/2022 1749   BILITOT 0.6 04/16/2022 1749   GFRNONAA >60 04/19/2022 0657   GFRAA >60 05/21/2015 0424   Lipase     Component Value Date/Time   LIPASE 21 04/16/2022 1749    Studies/Results: VAS US CAROTID  Result Date: 04/20/2022 Carotid Arterial Duplex Study Patient Name:  Monica Pugh  Date of Exam:   04/19/2022 Medical Rec #: [id number]   Accession #:    [id number] Date of Birth: 1950-10-08    Patient Gender: F Patient Age:   72 years Exam Location:  [facility] Procedure:      VAS US CAROTID Referring Phys: Alferd Apa --------------------------------------------------------------------------------  Indications:       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 Risk Factors:  Hypertension, hyperlipidemia, Diabetes, past history of                    smoking, prior CVA, PAD. Comparison Study:  06-07-2021 Most recent prior carotid duplex. Performing Technologist: Darlin Coco RDMS, RVT  Examination Guidelines: A complete evaluation includes B-mode imaging, spectral Doppler, color Doppler, and power Doppler as needed of all accessible portions of each vessel. Bilateral testing is considered an integral part of a complete examination. Limited examinations for reoccurring indications may be performed as noted.  Right Carotid Findings: +----------+--------+--------+--------+------------------+--------+           PSV cm/sEDV cm/sStenosisPlaque DescriptionComments +----------+--------+--------+--------+------------------+--------+ CCA Prox                  Occluded                           +----------+--------+--------+--------+------------------+--------+ CCA Mid                   Occluded                            +----------+--------+--------+--------+------------------+--------+ CCA Distal                Occluded                           +----------+--------+--------+--------+------------------+--------+ ICA Prox                  Occluded                           +----------+--------+--------+--------+------------------+--------+ ICA Mid                   Occluded                           +----------+--------+--------+--------+------------------+--------+ ICA Distal                Occluded                           +----------+--------+--------+--------+------------------+--------+ ECA       39      10                                         +----------+--------+--------+--------+------------------+--------+ +----------+--------+-------+---------+-------------------+           PSV cm/sEDV cmsDescribe Arm Pressure (mmHG) +----------+--------+-------+---------+-------------------+ [id number]            Turbulent                    +----------+--------+-------+---------+-------------------+ +---------+--------+---+--------+--+---------+ VertebralPSV cm/s122EDV cm/s16Antegrade +---------+--------+---+--------+--+---------+  Left Carotid Findings: +----------+--------+--------+--------+------------------+--------+           PSV cm/sEDV cm/sStenosisPlaque DescriptionComments +----------+--------+--------+--------+------------------+--------+ CCA Prox  48      19                                         +----------+--------+--------+--------+------------------+--------+ CCA Distal  Stent    +----------+--------+--------+--------+------------------+--------+ ICA Prox                                            Stent    +----------+--------+--------+--------+------------------+--------+ ICA Mid                                             Stent     +----------+--------+--------+--------+------------------+--------+ ICA Distal117     40                                         +----------+--------+--------+--------+------------------+--------+ ECA       88      15                                         +----------+--------+--------+--------+------------------+--------+ +----------+--------+--------+---------+-------------------+           PSV cm/sEDV cm/sDescribe Arm Pressure (mmHG) +----------+--------+--------+---------+-------------------+ Subclavian307             Turbulent                    +----------+--------+--------+---------+-------------------+ +---------+--------+---+--------+--+---------+ VertebralPSV cm/s111EDV cm/s25Antegrade +---------+--------+---+--------+--+---------+  Left Stent(s): +--------------+---+---+------------+---------+-------------------------------+ Prox to Stent 52 19                                                      +--------------+---+---+------------+---------+-------------------------------+ Proximal Stent68 20                                                      +--------------+---+---+------------+---------+-------------------------------+ Mid Stent     [id number]>75%                 Calcific w/ posteriorior                            stenosis             shadowing                       +--------------+---+---+------------+---------+-------------------------------+ Distal Stent  15244             Turbulent                                +--------------+---+---+------------+---------+-------------------------------+ Distal to     11129                                                      Stent                                                                    +--------------+---+---+------------+---------+-------------------------------+  Summary: Right Carotid: Evidence consistent with a total occlusion of the right ICA. The                 CCA appears occluded. Left Carotid: >75% stenosis is again visualized in the stent. Velocities appear               essentially unchanged as compared to previous examination. Vertebrals:  Bilateral vertebral arteries demonstrate antegrade flow. Subclavians: Bilateral subclavian artery flow was disturbed. Increased PSV              bilaterally as compared to previous examination. *See table(s) above for measurements and observations.  Electronically signed by Antony Contras MD on 04/20/2022 at 12:59:20 PM.    Final    ECHOCARDIOGRAM COMPLETE  Result Date: 04/19/2022    ECHOCARDIOGRAM REPORT   Patient Name:   Monica Pugh Date of Exam: 04/19/2022 Medical Rec #:  [id number]  Height:       63.0 in Accession #:    [id number] Weight:       185.2 lb Date of Birth:  05-16-1950   BSA:          1.871 m Patient Age:    32 years   BP:           125/76 mmHg Patient Gender: F          HR:           77 bpm. Exam Location:  Inpatient Procedure: 2D Echo, Cardiac Doppler and Color Doppler Indications:    R06.9 DOE  History:        Patient has prior history of Echocardiogram examinations, most                 recent 05/10/2015. CHF, PAD, Stroke and Carotid Disease; Risk                 Factors:Hypertension and Current Smoker.  Sonographer:    Wilkie Aye RVT RCS Referring Phys: [id number] [place]  1. Left ventricular ejection fraction, by estimation, is 65 to 70%. The left ventricle has normal function. The left ventricle has no regional wall motion abnormalities. There is mild concentric left ventricular hypertrophy. Left ventricular diastolic parameters are consistent with Grade II diastolic dysfunction (pseudonormalization). There is the interventricular septum is flattened in systole, consistent with right ventricular pressure overload.  2. Right ventricular systolic function is normal. The right ventricular size is normal. There is moderately elevated pulmonary artery systolic pressure. The estimated right  ventricular systolic pressure is [id number] mmHg.  3. Left atrial size was mildly dilated.  4. The mitral valve is normal in structure. Mild to moderate mitral valve regurgitation. No evidence of mitral stenosis.  5. The aortic valve is tricuspid. There is mild thickening of the aortic valve. Aortic valve regurgitation is not visualized. Aortic valve sclerosis is present, with no evidence of aortic valve stenosis.  6. There is systolic notching of the pulmonary systolic Doppler signal, consistent with increased pulmonary vascular resistance.  7. The inferior vena cava is dilated in size with >50% respiratory variability, suggesting right atrial pressure of 8 mmHg. Comparison(s): Prior images reviewed side by side. Changes from prior study are noted. The systolic pulmonary artery pressure is higher. FINDINGS  Left Ventricle: Left ventricular ejection fraction, by estimation, is 65 to 70%. The left ventricle has normal function. The left ventricle has no regional wall motion abnormalities. The left ventricular internal cavity size was normal in size. There is  mild concentric  left ventricular hypertrophy. The interventricular septum is flattened in systole, consistent with right ventricular pressure overload. Left ventricular diastolic parameters are consistent with Grade II diastolic dysfunction (pseudonormalization). Normal left ventricular filling pressure. Right Ventricle: The right ventricular size is normal. No increase in right ventricular wall thickness. Right ventricular systolic function is normal. There is moderately elevated pulmonary artery systolic pressure. The tricuspid regurgitant velocity is 3.42 m/s, and with an assumed right atrial pressure of 8 mmHg, the estimated right ventricular systolic pressure is [id number] mmHg. Left Atrium: Left atrial size was mildly dilated. Right Atrium: Right atrial size was normal in size. Pericardium: There is no evidence of pericardial effusion. Mitral Valve: The mitral valve  is normal in structure. Mild mitral annular calcification. Mild to moderate mitral valve regurgitation, with posteriorly-directed jet. No evidence of mitral valve stenosis. Tricuspid Valve: The tricuspid valve is normal in structure. Tricuspid valve regurgitation is mild. Aortic Valve: The aortic valve is tricuspid. There is mild thickening of the aortic valve. Aortic valve regurgitation is not visualized. Aortic valve sclerosis is present, with no evidence of aortic valve stenosis. Aortic valve mean gradient measures 5.0  mmHg. Aortic valve peak gradient measures 10.1 mmHg. Aortic valve area, by VTI measures 1.80 cm. Pulmonic Valve: There is systolic notching of the pulmonary systolic Doppler signal, consistent with increased pulmonary vascular resistance. The pulmonic valve was normal in structure. Pulmonic valve regurgitation is trivial. No evidence of pulmonic stenosis. Aorta: The aortic root and ascending aorta are structurally normal, with no evidence of dilitation. Venous: The inferior vena cava is dilated in size with greater than 50% respiratory variability, suggesting right atrial pressure of 8 mmHg. IAS/Shunts: No atrial level shunt detected by color flow Doppler.  LEFT VENTRICLE PLAX 2D LVIDd:         4.20 cm   Diastology LVIDs:         2.90 cm   LV e' medial:    7.05 cm/s LV PW:         1.30 cm   LV E/e' medial:  22.0 LV IVS:        1.30 cm   LV e' lateral:   6.00 cm/s LVOT diam:     1.80 cm   LV E/e' lateral: 25.8 LV SV:         59 LV SV Index:   31 LVOT Area:     2.54 cm  RIGHT VENTRICLE             IVC RV Basal diam:  2.80 cm     IVC diam: 2.10 cm RV S prime:     15.20 cm/s TAPSE (M-mode): 3.0 cm LEFT ATRIUM             Index        RIGHT ATRIUM           Index LA diam:        3.20 cm 1.71 cm/m   RA Area:     14.30 cm LA Vol (A2C):   63.2 ml 33.77 ml/m  RA Volume:   34.80 ml  18.59 ml/m LA Vol (A4C):   51.2 ml 27.36 ml/m LA Biplane Vol: 63.3 ml 33.82 ml/m  AORTIC VALVE                      PULMONIC VALVE AV Area (Vmax):    1.81 cm      PV Vmax:       0.95 m/s AV Area (Vmean):  1.76 cm      PV Peak grad:  3.6 mmHg AV Area (VTI):     1.80 cm AV Vmax:           159.00 cm/s AV Vmean:          108.000 cm/s AV VTI:            0.325 m AV Peak Grad:      10.1 mmHg AV Mean Grad:      5.0 mmHg LVOT Vmax:         113.00 cm/s LVOT Vmean:        74.500 cm/s LVOT VTI:          0.230 m LVOT/AV VTI ratio: 0.71  AORTA Ao Root diam: 2.70 cm Ao Arch diam: 2.3 cm MITRAL VALVE                TRICUSPID VALVE MV Area (PHT): 2.87 cm     TR Peak grad:   46.8 mmHg MV Decel Time: 264 msec     TR Vmax:        342.00 cm/s MV E velocity: 155.00 cm/s MV A velocity: 167.00 cm/s  SHUNTS MV E/A ratio:  0.93         Systemic VTI:  0.23 m                             Systemic Diam: 1.80 cm Dani Gobble Croitoru MD Electronically signed by Sanda Klein MD Signature Date/Time: 04/19/2022/1:21:17 PM    Final    CT CHEST WO CONTRAST  Result Date: 04/19/2022 CLINICAL DATA:  Colon cancer. Assess treatment response. Evaluate for metastatic disease. * Tracking Code: BO * EXAM: CT CHEST WITHOUT CONTRAST TECHNIQUE: Multidetector CT imaging of the chest was performed following the standard protocol without IV contrast. RADIATION DOSE REDUCTION: This exam was performed according to the departmental dose-optimization program which includes automated exposure control, adjustment of the mA and/or kV according to patient size and/or use of iterative reconstruction technique. COMPARISON:  None Available. FINDINGS: Cardiovascular: No acute cardiovascular findings on noncontrast exam. Coronary artery calcification and aortic atherosclerotic calcification. Mediastinum/Nodes: Large RIGHT lower paratracheal lymph node measures 3.3 cm. Subcarinal lymph node measures 2.1 cm. No supraclavicular or axillary adenopathy Lungs/Pleura: Ovoid nodule in the posterior RIGHT lower lobe measures 2.7 cm. This nodule may be above the imaging on recent CT of the abdomen.  There is new interstitial thickening at the LEFT and RIGHT lung base compared to prior. One nodule in the LEFT lower lobe measuring 11 mm (image 99/4 is increased from 8 mm on recent exam. Small effusions. No upper lobe nodularity Upper Abdomen: No abnormality on noncontrast exam Musculoskeletal: No aggressive osseous lesion. IMPRESSION: 1. Bulky mediastinal nodal metastasis. 2. Oblong nodule in the RIGHT lower lobe increase in size of LEFT lower lobe nodule over short interval. Findings favor bilateral lower lobe nodular metastasis. 3. New bibasilar interstitial thickening over a 3 day interval. Concern for rapid progression lymphangitic spread versus atelectasis and interstitial thickening related to recent anaesthesia for colonoscopy. Favor benign interstitial thickening. Electronically Signed   By: Suzy Bouchard M.D.   On: 04/19/2022 08:58    Anti-infectives: Anti-infectives (From admission, onward)    Start     Dose/Rate Route Frequency Ordered Stop   04/16/22 2130  piperacillin-tazobactam (ZOSYN) IVPB 3.375 g        3.375 g 12.5 mL/hr over 240 Minutes Intravenous Every 8 hours 04/16/22  2126          Assessment/Plan Sigmoid Colon Mass (Adenocarcinoma) with Mets - CT showed mid sigmoid colon mass with infiltration of the sigmoid mesocolon + associated pathologic adenopathy. Colonoscopy with partially obstructing sigmoid colon mass involving 2/3 of the lumen circumference but was unable to be traversed with ultraslim colonoscope. Area of distal mass was tattooed.  - Colonoscopy Bx with Adenocarcinoma. Per discussion with attending, Path doing further eval to see if GI/GYN in origin.  - CEA 2.3. To note she did have elevated CA 125 of 83.5 on 2/14  - CT Chest with bulky mediastinal nodal mets and b/l lung nodular mets.  - Dr. Chryl Heck of Onc has seen. Per note, tx would be palliative and she cannot be cured of the advanced colon cancer. Surgery would only be palliative. No role for radiation.  Considering molecular testing and outpatient chemo.  - As stated by Onc, surgery would be palliative. Given she is not obstructed clinically, will see how she does with diet advancement. Cont Miralax. If she was to develop obstructive symptoms, would likely offer loop colostomy.    FEN - Adv to soft diet + shakes. IVF per [facility] VTE - SCDs, please hold Brilinta until we know she will not need surgery. ASA per Vascular. Okay for Lovenox or subq heparin ID - Zosyn per primary, does not need from our standpoint.  Foley - None   - Per primary -  Cards - Appreciate their eval/clearance. If needs surgery, cardiac risk elevated given underlying comorbidities but not prohibitive.  Hx of endometrial cancer s/p robotic assisted TAH/BSO with b/l ureterolysis by Dr. Gwenlyn Found at [place] 05/2021 (per Oncology/Dr. Federico Flake note she was recommended for adjuvant chemotherapy and XRT but declined) HLD Hx CVA on Plavix (last dose 2/20) OSA not on CPAP Tobacco use - on o2 here. New cough.  PVD/PAD s/p iliac stents Carotid dz s/p L stent by Dr. Estanislado Pandy 2022 with > 75% stenosis in stent at area of native bifurcation and complete occlusion R ICA on [place] 05/2021. 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 Discussed with vascular surgery who recommended ASA while off Brilinta . F/u US done.   I reviewed nursing notes, Consultant (Onc) notes, last 24 h vitals and pain scores, last 48 h intake and output, last 24 h labs and trends, and last 24 h imaging results.   LOS: 4 days    Monica Pugh , [facility] Surgery 04/21/2022, 7:49 AM Please see Amion for pager number during day hours 7:00am-4:30pm

## 2022-04-22 DIAGNOSIS — K6389 Other specified diseases of intestine: Secondary | ICD-10-CM | POA: Diagnosis not present

## 2022-04-22 LAB — BASIC METABOLIC PANEL
Anion gap: 10 (ref 5–15)
BUN: 7 mg/dL — ABNORMAL LOW (ref 8–23)
CO2: 25 mmol/L (ref 22–32)
Calcium: 8.3 mg/dL — ABNORMAL LOW (ref 8.9–10.3)
Chloride: 96 mmol/L — ABNORMAL LOW (ref 98–111)
Creatinine, Ser: 0.81 mg/dL (ref 0.44–1.00)
GFR, Estimated: >60 mL/min (ref >60)
Glucose, Bld: 112 mg/dL — ABNORMAL HIGH (ref 70–99)
Potassium: 3.4 mmol/L — ABNORMAL LOW (ref 3.5–5.1)
Sodium: 131 mmol/L — ABNORMAL LOW (ref 135–145)

## 2022-04-22 LAB — SURGICAL PATHOLOGY

## 2022-04-22 LAB — CBC
HCT: 25.9 % — ABNORMAL LOW (ref 36.0–46.0)
Hemoglobin: 8.1 g/dL — ABNORMAL LOW (ref 12.0–15.0)
MCH: 23.8 pg — ABNORMAL LOW (ref 26.0–34.0)
MCHC: 31.3 g/dL (ref 30.0–36.0)
MCV: 76 fL — ABNORMAL LOW (ref 80.0–100.0)
Platelets: 331 10*3/uL (ref 150–400)
RBC: 3.41 MIL/uL — ABNORMAL LOW (ref 3.87–5.11)
RDW: 19.8 % — ABNORMAL HIGH (ref 11.5–15.5)
WBC: 13.4 10*3/uL — ABNORMAL HIGH (ref 4.0–10.5)
nRBC: 0 % (ref 0.0–0.2)

## 2022-04-22 MED ORDER — SENNOSIDES-DOCUSATE SODIUM 8.6-50 MG PO TABS
2.0000 | ORAL_TABLET | Freq: Two times a day (BID) | ORAL | Status: DC
  Administered 2022-04-22 – 2022-04-25 (×6): 2 via ORAL
  Filled 2022-04-22 (×6): qty 2

## 2022-04-22 MED ORDER — LACTULOSE 10 GM/15ML PO SOLN
10.0000 g | Freq: Two times a day (BID) | ORAL | Status: AC
  Administered 2022-04-22 (×2): 10 g via ORAL
  Filled 2022-04-22 (×3): qty 15

## 2022-04-22 NOTE — Progress Notes (Signed)
PROGRESS NOTE  Monica Pugh [id number] DOB: [birth year] DOA: 04/16/2022 PCP: Bonnita Nasuti, MD  HPI/Recap of past 24 hours: Patient is a 72 year old female with CAD, CVA, endometrial CA with TAH and BSO with ureterolysis at [place] in April 2023, carotid artery occlusion, HLP, OSA not on CPAP, PVD, GERD, bilateral hearing loss, tobacco use presented with lower abdominal pain, nausea and vomiting. CT abdomen pelvis showed circumferential wall thickening involving the mid sigmoid colon with associated hyperemia suspicious for infiltrative mass.  Patient also developed hypoxia in ED after she received 4 mg IV morphine.  Underwent colonoscopy showed partially obstructing tumor in the sigmoid colon. Not able to bypass with ultraslim colonoscope. Surgery recommend inpatient sigmoidectomy. Cardiology consulted for pre op evaluation and cleared for sigmoid resection. Found to have mediastinal bulky lymphadenopathy. Oncology consulted.   04/22/2022: The patient was seen and examined at her bedside.  Her husband was present in the room.  Denies having any abdominal pain.  Per GI, Dr. Lorenso Courier, adenocarcinoma appears to be of endometrial origin and not of colon origin.  Seen by medical oncology Dr. Chryl Heck and general surgery Dr. Dema Severin.  The patient and her husband okay with surgery if necessary.  Patient's husband is hopeful to discuss further the option of palliative surgery with the general surgical team on Sunday, 04/24/2022.  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 -CT showed circumferential wall thickening involving the mid sigmoid colon with associated hyperemia suspicious  for an infiltrative mass, + adenopathy within the sigmoid mesentery and left periaortic lymph node group  . -Mild leukocytosis, patient was started on IV zosyn, IV fluids, antiemetics PRN.   -GI was consulted, underwent colonoscopy on 2/26 which showed partially obstructing tumor in the sigmoid colon, not able to bypass with ultraslim colonoscope. Nonbleeding internal hemorrhoids.  - Pathology + adenocarcinoma -CT chest showed bulky mediastinal nodal metastasis, bilateral lower lobe nodular metastasis General surgery oncology following. -2D echo EF 65 to 70%, no regional WMA, G2 DD, moderately elevated PASP.  Carotid ultrasound shows total occlusion of right ICA, more than 75% stenosis in the stent of left carotid.  Per cardiology, increased perioperative cardiac risk, certainly not prohibitive and reasonable to proceed with sigmoid resection Possible palliative surgery during this admission.  Constipation in the setting of sigmoid colon mass. Bowel regimen in place to avoid obstruction. Lactulose 10 g x 2 doses. Senokot 2 tablets twice daily  Refractory hypokalemia, hypomagnesemia Replace potassium, magnesium, continue to replace electrolytes as indicated.   Acute hypoxemia/acute hypoxic respiratory failure: In the setting of opioid use -O2 sats 99% on 2 L, wean O2 as tolerated -Cautious use of opioid use  -Chest x-ray:Low lung volumes with mild bibasilar atelectasis.    History of stroke, PVD, hyperlipidemia -Continue statin, hold aspirin and Plavix in anticipation of surgery   GERD -Continue PPI   Iron deficiency anemia: -Monitor H&H, transfuse for hemoglobin  <8    Essential hypertension -BP stable, continue IV hydralazine as needed with parameters   Obesity Estimated body mass index is 33.13 kg/m as calculated from the following:   Height as of this encounter: '5\' 3"'$  (1.6 m).   Weight as of this encounter: 84.8  kg.   Code Status: DNR DVT Prophylaxis:  Place and maintain  sequential compression device Start: 04/17/22 0850 SCDs Start: 04/16/22 2335     Level of Care: Level of care: Telemetry Medical Family Communication: Updated patient's husband at bedside.  Disposition Plan:      Remains inpatient appropriate: Pending surgery if develops obstructive symptoms   Procedures:  Colonoscopy 2D echo Carotid Doppler   Consultants:   Gastroenterology Surgery Cardiology   Objective: Vitals:   04/22/22 0228 04/22/22 0735 04/22/22 0748 04/22/22 1213  BP:  (!) 158/49  (!) 148/64  Pulse:  80 81 81  Resp:  '16 14 16  '$ Temp:  97.7 F (36.5 C)  98.1 F (36.7 C)  TempSrc:  Oral  Oral  SpO2:  94% 94% 100%  Weight: 85.6 kg     Height:        Intake/Output Summary (Last 24 hours) at 04/22/2022 1440 Last data filed at 04/22/2022 0042 Gross per 24 hour  Intake 240 ml  Output 100 ml  Net 140 ml   Filed Weights   04/20/22 0354 04/21/22 0417 04/22/22 0228  Weight: 85.1 kg 84.8 kg 85.6 kg    Exam:  General: 72 y.o. year-old female well developed well nourished in no acute distress.  Alert and oriented x3.  Very hard of hearing. Cardiovascular: Regular rate and rhythm with no rubs or gallops.  No thyromegaly or JVD noted.   Respiratory: Clear to auscultation with no wheezes or rales. Good inspiratory effort. Abdomen: Soft nontender nondistended with normal bowel sounds x4 quadrants. Musculoskeletal: No lower extremity edema. 2/4 pulses in all 4 extremities. Skin: No ulcerative lesions noted or rashes, Psychiatry: Mood is appropriate for condition and setting   Data Reviewed: CBC: Recent Labs  Lab 04/16/22 1749 04/17/22 0158 04/18/22 0754 04/19/22 0657 04/22/22 0039  WBC 14.7* 13.1* 13.6* 13.0* 13.4*  NEUTROABS 11.1*  --   --   --   --   HGB 11.7* 10.6* 9.8* 9.3* 8.1*  HCT 38.2 33.3* 31.5* 29.1* 25.9*  MCV 76.4* 76.2* 77.0* 76.2* 76.0*  PLT 419* 353 345 321 [id number]   Basic Metabolic Panel: Recent Labs  Lab 04/17/22 0158 04/18/22 0754  04/19/22 0657 04/21/22 1506 04/22/22 0039  NA 134* 137 133* 131* 131*  K 3.1* 3.7 3.5 3.8 3.4*  CL 96* 100 99 95* 96*  CO2 '28 25 25 25 25  '$ GLUCOSE 112* 94 104* 108* 112*  BUN '8 8 9 8 '$ 7*  CREATININE 0.79 0.81 0.76 0.75 0.81  CALCIUM 8.4* 8.2* 8.3* 8.4* 8.3*  MG 1.7  --  1.7  --   --   PHOS  --   --  3.2  --   --    GFR: Estimated Creatinine Clearance: 67 mL/min (by C-G formula based on SCr of 0.81 mg/dL). Liver Function Tests: Recent Labs  Lab 04/16/22 1749  AST 14*  ALT 8  ALKPHOS 111  BILITOT 0.6  PROT 6.5  ALBUMIN 3.3*   Recent Labs  Lab 04/16/22 1749  LIPASE 21   No results for input(s): "AMMONIA" in the last 168 hours. Coagulation Profile: Recent Labs  Lab 04/19/22 0657  INR 1.1   Cardiac Enzymes: No results for input(s): "CKTOTAL", "CKMB", "CKMBINDEX", "TROPONINI" in the last 168 hours. BNP (last 3 results) No results for input(s): "PROBNP" in the last 8760 hours. HbA1C: No results for input(s): "HGBA1C" in the last 72 hours. CBG: No results for input(s): "GLUCAP" in the last 168 hours. Lipid Profile:  No results for input(s): "CHOL", "HDL", "LDLCALC", "TRIG", "CHOLHDL", "LDLDIRECT" in the last 72 hours. Thyroid Function Tests: No results for input(s): "TSH", "T4TOTAL", "FREET4", "T3FREE", "THYROIDAB" in the last 72 hours. Anemia Panel: No results for input(s): "VITAMINB12", "FOLATE", "FERRITIN", "TIBC", "IRON", "RETICCTPCT" in the last 72 hours. Urine analysis:    Component Value Date/Time   COLORURINE AMBER (A) 04/16/2022 1751   APPEARANCEUR HAZY (A) 04/16/2022 1751   LABSPEC 1.035 (H) 04/16/2022 1751   PHURINE 5.0 04/16/2022 1751   GLUCOSEU NEGATIVE 04/16/2022 1751   HGBUR NEGATIVE 04/16/2022 1751   BILIRUBINUR SMALL (A) 04/16/2022 1751   KETONESUR NEGATIVE 04/16/2022 1751   PROTEINUR 30 (A) 04/16/2022 1751   NITRITE NEGATIVE 04/16/2022 1751   LEUKOCYTESUR NEGATIVE 04/16/2022 1751   Sepsis  Labs: '@LABRCNTIP'$ (procalcitonin:4,lacticidven:4)  ) Recent Results (from the past 240 hour(s))  Resp panel by RT-PCR (RSV, Flu A&B, Covid) Anterior Nasal Swab     Status: None   Collection Time: 04/16/22  5:52 PM   Specimen: Anterior Nasal Swab  Result Value Ref Range Status   SARS Coronavirus 2 by RT PCR NEGATIVE NEGATIVE Final   Influenza A by PCR NEGATIVE NEGATIVE Final   Influenza B by PCR NEGATIVE NEGATIVE Final    Comment: (NOTE) The Xpert Xpress SARS-CoV-2/FLU/RSV plus assay is intended as an aid in the diagnosis of influenza from Nasopharyngeal swab specimens and should not be used as a sole basis for treatment. Nasal washings and aspirates are unacceptable for Xpert Xpress SARS-CoV-2/FLU/RSV testing.  Fact Sheet for Patients: EntrepreneurPulse.com.au  Fact Sheet for Healthcare Providers: IncredibleEmployment.be  This test is not yet approved or cleared by the [place] FDA and has been authorized for detection and/or diagnosis of SARS-CoV-2 by FDA under an Emergency Use Authorization (EUA). This EUA will remain in effect (meaning this test can be used) for the duration of the COVID-19 declaration under Section 564(b)(1) of the Act, 21 U.S.C. section 360bbb-3(b)(1), unless the authorization is terminated or revoked.     Resp Syncytial Virus by PCR NEGATIVE NEGATIVE Final    Comment: (NOTE) Fact Sheet for Patients: EntrepreneurPulse.com.au  Fact Sheet for Healthcare Providers: IncredibleEmployment.be  This test is not yet approved or cleared by the [place] FDA and has been authorized for detection and/or diagnosis of SARS-CoV-2 by FDA under an Emergency Use Authorization (EUA). This EUA will remain in effect (meaning this test can be used) for the duration of the COVID-19 declaration under Section 564(b)(1) of the Act, 21 U.S.C. section 360bbb-3(b)(1), unless the authorization is  terminated or revoked.  Performed at [facility], [place] [address] [place], [place] [address]       Studies: No results found.  Scheduled Meds:  aspirin  81 mg Oral Daily   atorvastatin  40 mg Oral q AM   cholecalciferol  5,000 Units Oral Daily   feeding supplement  1 Container Oral TID BM   ferrous sulfate  325 mg Oral Q breakfast   guaiFENesin  1,200 mg Oral BID   ipratropium-albuterol  3 mL Nebulization BID   lactulose  10 g Oral BID   pantoprazole (PROTONIX) IV  40 mg Intravenous Q12H   polyethylene glycol  17 g Oral Daily   potassium chloride  40 mEq Oral Daily    Continuous Infusions:  piperacillin-tazobactam (ZOSYN)  IV 3.375 g (04/22/22 0513)     LOS: 5 days     Kayleen Memos, MD [facility] Pager [phone]  If 7PM-7AM, please contact night-coverage [email or web address] Password  [email or web address] 04/22/2022, 2:40 PM

## 2022-04-22 NOTE — Plan of Care (Signed)

## 2022-04-22 NOTE — Progress Notes (Signed)
Mobility Specialist - Progress Note   04/22/22 1100  Mobility  Activity Ambulated independently in hallway  Level of Assistance Modified independent, requires aide device or extra time  Assistive Device None  Distance Ambulated (ft) 300 ft  Activity Response Tolerated well  Mobility Referral Yes  $Mobility charge 1 Mobility    Pt received in bed and agreeable. No complaints throughout walk, no physical assistance needed. Left sitting EOB w/ call bell in reach and all needs met.   [place] Specialist Please contact via SecureChat or Rehab office at [phone]

## 2022-04-22 NOTE — Progress Notes (Signed)
4 Days Post-Op  Subjective: CC: Tolerating soft diet yesterday without n/v. Had [employer] for dinner. Still some llq pain but improved. Passing flatus. No bm yesterday.   Objective: Vital signs in last 24 hours: Temp:  [97.7 F (36.5 C)-98.2 F (36.8 C)] 97.7 F (36.5 C) (03/01 0735) Pulse Rate:  [77-81] 81 (03/01 0748) Resp:  [14-20] 14 (03/01 0748) BP: (136-158)/(49-69) 158/49 (03/01 0735) SpO2:  [94 %-100 %] 94 % (03/01 0748) Weight:  [85.6 kg] 85.6 kg (03/01 0228) Last BM Date : 04/18/22  Intake/Output from previous day: 02/29 0701 - 03/01 0700 In: 547.9 [P.O.:240; IV Piggyback:307.9] Out: 100 [Urine:100] Intake/Output this shift: No intake/output data recorded.  PE: Gen:  Alert, NAD, pleasant Abd: Soft, improved ttp of the LLQ without rigidity or guarding. +BS.  Psych: A&Ox3   Lab Results:  Recent Labs    04/22/22 0039  WBC 13.4*  HGB 8.1*  HCT 25.9*  PLT 331    BMET Recent Labs    04/21/22 1506 04/22/22 0039  NA 131* 131*  K 3.8 3.4*  CL 95* 96*  CO2 25 25  GLUCOSE 108* 112*  BUN 8 7*  CREATININE 0.75 0.81  CALCIUM 8.4* 8.3*    PT/INR No results for input(s): "LABPROT", "INR" in the last 72 hours.  CMP     Component Value Date/Time   NA 131 (L) 04/22/2022 0039   K 3.4 (L) 04/22/2022 0039   CL 96 (L) 04/22/2022 0039   CO2 25 04/22/2022 0039   GLUCOSE 112 (H) 04/22/2022 0039   BUN 7 (L) 04/22/2022 0039   CREATININE 0.81 04/22/2022 0039   CALCIUM 8.3 (L) 04/22/2022 0039   PROT 6.5 04/16/2022 1749   ALBUMIN 3.3 (L) 04/16/2022 1749   AST 14 (L) 04/16/2022 1749   ALT 8 04/16/2022 1749   ALKPHOS 111 04/16/2022 1749   BILITOT 0.6 04/16/2022 1749   GFRNONAA >60 04/22/2022 0039   GFRAA >60 05/21/2015 0424   Lipase     Component Value Date/Time   LIPASE 21 04/16/2022 1749    Studies/Results: No results found.  Anti-infectives: Anti-infectives (From admission, onward)    Start     Dose/Rate Route Frequency Ordered Stop   04/16/22  2130  piperacillin-tazobactam (ZOSYN) IVPB 3.375 g        3.375 g 12.5 mL/hr over 240 Minutes Intravenous Every 8 hours 04/16/22 2126          Assessment/Plan Sigmoid Colon Mass (Adenocarcinoma) with Mets Hx of endometrial cancer s/p robotic assisted TAH/BSO with b/l ureterolysis by Dr. Gwenlyn Found at [place] 05/2021 (per Oncology/Dr. Federico Flake note she was recommended for adjuvant chemotherapy and XRT but declined) - CT showed mid sigmoid colon mass with infiltration of the sigmoid mesocolon + associated pathologic adenopathy. Colonoscopy with partially obstructing sigmoid colon mass involving 2/3 of the lumen circumference but was unable to be traversed with ultraslim colonoscope. Area of distal mass was tattooed.  - Colonoscopy Bx with Adenocarcinoma.  - CEA 2.3. To note she did have elevated CA 125 of 83.5 on 2/14  - CT Chest with bulky mediastinal nodal mets and b/l lung nodular mets.  - Dr. Chryl Heck of Onc has seen. Per note, tx would be palliative and she cannot be cured of the advanced colon cancer. Surgery would only be palliative. No role for radiation. Considering molecular testing and outpatient chemo.  - As stated by Onc, surgery would be palliative. Given she is not obstructed clinically, will see how she does with diet  advancement. Cont Miralax. If she was to develop obstructive symptoms, would likely offer loop colostomy.  - Colonoscopy Bx with Adenocarcinoma of endometrial origin rather than colonic. Recommending re-engaging oncology for recs.    FEN - Soft diet + shakes. IVF per [facility] VTE - SCDs, please hold Brilinta until we know she will not need surgery. ASA per Vascular. Okay for Lovenox or subq heparin ID - Zosyn per primary, does not need from our standpoint.  Foley - None   - Per primary -  Cards - Appreciate their eval/clearance. If needs surgery, cardiac risk elevated given underlying comorbidities but not prohibitive.  HLD Hx CVA on Plavix (last dose 2/20) OSA not on  CPAP Tobacco use - on o2 here. New cough.  PVD/PAD s/p iliac stents Carotid dz s/p L stent by Dr. Estanislado Pandy 2022 with > 75% stenosis in stent at area of native bifurcation and complete occlusion R ICA on [place] 05/2021. 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 Discussed with vascular surgery who recommended ASA while off Brilinta . F/u US done.   I reviewed nursing notes, Consultant (GI) notes, last 24 h vitals and pain scores, last 48 h intake and output, last 24 h labs and trends, and last 24 h imaging results.   LOS: 5 days    Jillyn Ledger , [facility] Surgery 04/22/2022, 9:20 AM Please see Amion for pager number during day hours 7:00am-4:30pm

## 2022-04-23 ENCOUNTER — Inpatient Hospital Stay (HOSPITAL_COMMUNITY): Payer: Medicare HMO

## 2022-04-23 DIAGNOSIS — K6389 Other specified diseases of intestine: Secondary | ICD-10-CM | POA: Diagnosis not present

## 2022-04-23 MED ORDER — FUROSEMIDE 10 MG/ML IJ SOLN
20.0000 mg | Freq: Once | INTRAMUSCULAR | Status: AC
  Administered 2022-04-23: 20 mg via INTRAVENOUS
  Filled 2022-04-23: qty 2

## 2022-04-23 NOTE — Progress Notes (Signed)
PROGRESS NOTE    Monica Pugh  [id number] DOB: November 05, 1950 DOA: 04/16/2022 PCP: Bonnita Nasuti, MD   Brief Narrative:    Patient is a 72 year old female with CAD, CVA, endometrial CA with TAH and BSO with ureterolysis at [facility] in April 2023, carotid artery occlusion, HLP, OSA not on CPAP, PVD, GERD, bilateral hearing loss, tobacco use presented with lower abdominal pain, nausea and vomiting. CT abdomen pelvis showed circumferential wall thickening involving the mid sigmoid colon with associated hyperemia suspicious for infiltrative mass.  Patient also developed hypoxia in ED after she received 4 mg IV morphine.  Underwent colonoscopy showed partially obstructing tumor in the sigmoid colon. Not able to bypass with ultraslim colonoscope. Surgery recommend inpatient sigmoidectomy. Cardiology consulted for pre op evaluation and cleared for sigmoid resection. Found to have mediastinal bulky lymphadenopathy. Oncology consulted.   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 -CT showed circumferential wall thickening involving the mid sigmoid colon with associated hyperemia suspicious for an infiltrative mass, + adenopathy within the sigmoid mesentery and left periaortic lymph node group  . -Mild leukocytosis, patient was started on IV zosyn, IV fluids, antiemetics PRN.   -GI was consulted, underwent colonoscopy on 2/26 which showed partially obstructing tumor in the sigmoid colon, not able to bypass with ultraslim colonoscope. Nonbleeding internal hemorrhoids.  - Pathology + adenocarcinoma -CT chest showed bulky mediastinal nodal  metastasis, bilateral lower lobe nodular metastasis General surgery oncology following. -2D echo EF 65 to 70%, no regional WMA, G2 DD, moderately elevated PASP.  Carotid ultrasound shows total occlusion of right ICA, more than 75% stenosis in the stent of left carotid.  Per cardiology, increased perioperative cardiac risk, certainly not prohibitive and reasonable to proceed with sigmoid resection Possible palliative surgery during this admission.   Constipation in the setting of sigmoid colon mass. Bowel regimen in place to avoid obstruction. Lactulose 10 g x 2 doses. Senokot 2 tablets twice daily   Refractory hypokalemia, hypomagnesemia Replace potassium, magnesium, continue to replace electrolytes as indicated.   Acute hypoxemia/acute hypoxic respiratory failure: In the setting of opioid use -O2 sats 99% on 2 L, wean O2 as tolerated -Cautious use of opioid use  -Chest x-ray 3/2 with no significant findings, but appears volume overloaded -Give 1 dose IV Lasix 20 mg 3/2    History of stroke, PVD, hyperlipidemia -Continue statin, hold aspirin and Plavix in anticipation of surgery   GERD -Continue PPI   Iron deficiency anemia: -Monitor H&H, transfuse for hemoglobin  <8    Essential hypertension -BP stable, continue IV hydralazine as needed with parameters   Obesity Estimated body mass index is 33.13 kg/m as calculated from the following:   Height as of this encounter: '5\' 3"'$  (1.6 m).   Weight as of this encounter: 84.8 kg.   DVT prophylaxis:SCDs Code Status: DNR Family Communication: Multiple family members at bedside 3/2 Disposition Plan:  Status is: Inpatient Remains inpatient appropriate because: Continues to require IV medications.  Consultants:  Oncology General Surgery GI Cardiology  Procedures:  None  Antimicrobials:  Anti-infectives (From admission, onward)    Start     Dose/Rate Route Frequency  Ordered Stop   04/16/22 2130  piperacillin-tazobactam  (ZOSYN) IVPB 3.375 g        3.375 g 12.5 mL/hr over 240 Minutes Intravenous Every 8 hours 04/16/22 2126         Subjective: Patient seen and evaluated today with no new acute complaints or concerns. No acute concerns or events noted overnight.  Objective: Vitals:   04/22/22 2132 04/23/22 0035 04/23/22 0400 04/23/22 0802  BP: (!) 173/64 (!) 151/60 (!) 150/89 (!) 151/63  Pulse:  82 88   Resp:  17 20 (!) 24  Temp:  98.3 F (36.8 C) 98.3 F (36.8 C) 98.2 F (36.8 C)  TempSrc:  Oral Oral Oral  SpO2:  99% 99% 98%  Weight:   85.5 kg   Height:        Intake/Output Summary (Last 24 hours) at 04/23/2022 1124 Last data filed at 04/23/2022 0446 Gross per 24 hour  Intake 240 ml  Output 100 ml  Net 140 ml   Filed Weights   04/21/22 0417 04/22/22 0228 04/23/22 0400  Weight: 84.8 kg 85.6 kg 85.5 kg    Examination:  General exam: Appears calm and comfortable, hard of hearing Respiratory system: Clear to auscultation. Respiratory effort normal. 3L [place] Cardiovascular system: S1 & S2 heard, RRR.  Gastrointestinal system: Abdomen is soft Central nervous system: Alert and awake Extremities: No edema Skin: No significant lesions noted Psychiatry: Flat affect.    Data Reviewed: I have personally reviewed following labs and imaging studies  CBC: Recent Labs  Lab 04/16/22 1749 04/17/22 0158 04/18/22 0754 04/19/22 0657 04/22/22 0039  WBC 14.7* 13.1* 13.6* 13.0* 13.4*  NEUTROABS 11.1*  --   --   --   --   HGB 11.7* 10.6* 9.8* 9.3* 8.1*  HCT 38.2 33.3* 31.5* 29.1* 25.9*  MCV 76.4* 76.2* 77.0* 76.2* 76.0*  PLT 419* 353 345 321 [id number]   Basic Metabolic Panel: Recent Labs  Lab 04/17/22 0158 04/18/22 0754 04/19/22 0657 04/21/22 1506 04/22/22 0039  NA 134* 137 133* 131* 131*  K 3.1* 3.7 3.5 3.8 3.4*  CL 96* 100 99 95* 96*  CO2 '28 25 25 25 25  '$ GLUCOSE 112* 94 104* 108* 112*  BUN '8 8 9 8 '$ 7*  CREATININE 0.79 0.81 0.76 0.75 0.81  CALCIUM 8.4* 8.2* 8.3* 8.4* 8.3*  MG 1.7  --  1.7   --   --   PHOS  --   --  3.2  --   --    GFR: Estimated Creatinine Clearance: 66.9 mL/min (by C-G formula based on SCr of 0.81 mg/dL). Liver Function Tests: Recent Labs  Lab 04/16/22 1749  AST 14*  ALT 8  ALKPHOS 111  BILITOT 0.6  PROT 6.5  ALBUMIN 3.3*   Recent Labs  Lab 04/16/22 1749  LIPASE 21   No results for input(s): "AMMONIA" in the last 168 hours. Coagulation Profile: Recent Labs  Lab 04/19/22 0657  INR 1.1   Cardiac Enzymes: No results for input(s): "CKTOTAL", "CKMB", "CKMBINDEX", "TROPONINI" in the last 168 hours. BNP (last 3 results) No results for input(s): "PROBNP" in the last 8760 hours. HbA1C: No results for input(s): "HGBA1C" in the last 72 hours. CBG: No results for input(s): "GLUCAP" in the last 168 hours. Lipid Profile: No results for input(s): "CHOL", "HDL", "LDLCALC", "TRIG", "CHOLHDL", "LDLDIRECT" in the last 72 hours. Thyroid Function Tests: No results for input(s): "TSH", "T4TOTAL", "FREET4", "T3FREE", "THYROIDAB" in the last 72 hours. Anemia Panel: No results for input(s): "VITAMINB12", "  FOLATE", "FERRITIN", "TIBC", "IRON", "RETICCTPCT" in the last 72 hours. Sepsis Labs: No results for input(s): "PROCALCITON", "LATICACIDVEN" in the last 168 hours.  Recent Results (from the past 240 hour(s))  Resp panel by RT-PCR (RSV, Flu A&B, Covid) Anterior Nasal Swab     Status: None   Collection Time: 04/16/22  5:52 PM   Specimen: Anterior Nasal Swab  Result Value Ref Range Status   SARS Coronavirus 2 by RT PCR NEGATIVE NEGATIVE Final   Influenza A by PCR NEGATIVE NEGATIVE Final   Influenza B by PCR NEGATIVE NEGATIVE Final    Comment: (NOTE) The Xpert Xpress SARS-CoV-2/FLU/RSV plus assay is intended as an aid in the diagnosis of influenza from Nasopharyngeal swab specimens and should not be used as a sole basis for treatment. Nasal washings and aspirates are unacceptable for Xpert Xpress SARS-CoV-2/FLU/RSV testing.  Fact Sheet for  Patients: EntrepreneurPulse.com.au  Fact Sheet for Healthcare Providers: IncredibleEmployment.be  This test is not yet approved or cleared by the [place] FDA and has been authorized for detection and/or diagnosis of SARS-CoV-2 by FDA under an Emergency Use Authorization (EUA). This EUA will remain in effect (meaning this test can be used) for the duration of the COVID-19 declaration under Section 564(b)(1) of the Act, 21 U.S.C. section 360bbb-3(b)(1), unless the authorization is terminated or revoked.     Resp Syncytial Virus by PCR NEGATIVE NEGATIVE Final    Comment: (NOTE) Fact Sheet for Patients: EntrepreneurPulse.com.au  Fact Sheet for Healthcare Providers: IncredibleEmployment.be  This test is not yet approved or cleared by the [place] FDA and has been authorized for detection and/or diagnosis of SARS-CoV-2 by FDA under an Emergency Use Authorization (EUA). This EUA will remain in effect (meaning this test can be used) for the duration of the COVID-19 declaration under Section 564(b)(1) of the Act, 21 U.S.C. section 360bbb-3(b)(1), unless the authorization is terminated or revoked.  Performed at [facility], [place] [address], [place], [place] [address]          Radiology Studies: DG CHEST PORT 1 VIEW  Result Date: 04/23/2022 CLINICAL DATA:  Hypoxemia EXAM: PORTABLE CHEST 1 VIEW COMPARISON:  X-ray 04/16/2022.  CT noncontrast 04/19/2022 FINDINGS: Borderline cardiopericardial silhouette. There are areas of lobular widening of the mediastinum. Please correlate for CT scan describing lymph nodes. This includes right paratracheal. Hyperinflation. No pneumothorax, effusion or edema. Mild basilar atelectasis. Slight thickening along the minor fissure. Overlapping cardiac leads. Stent along the left side of the neck of the edge of the imaging field. Please correlate with history IMPRESSION:  Nodular widened mediastinum. Please correlate for description of abnormal lymph nodes by CT scan. Mild basilar atelectasis. Borderline size heart Electronically Signed   By: Jill Side M.D.   On: 04/23/2022 11:05        Scheduled Meds:  aspirin  81 mg Oral Daily   atorvastatin  40 mg Oral q AM   cholecalciferol  5,000 Units Oral Daily   feeding supplement  1 Container Oral TID BM   ferrous sulfate  325 mg Oral Q breakfast   furosemide  20 mg Intravenous Once   guaiFENesin  1,200 mg Oral BID   ipratropium-albuterol  3 mL Nebulization BID   pantoprazole (PROTONIX) IV  40 mg Intravenous Q12H   polyethylene glycol  17 g Oral Daily   potassium chloride  40 mEq Oral Daily   senna-docusate  2 tablet Oral BID   Continuous Infusions:  piperacillin-tazobactam (ZOSYN)  IV 3.375 g (04/23/22 0541)  LOS: 6 days    Time spent: 35 minutes    Jocelyn Lowery Darleen Crocker, DO [facility]  If 7PM-7AM, please contact night-coverage [email or web address] 04/23/2022, 11:24 AM

## 2022-04-23 NOTE — Progress Notes (Addendum)
Pharmacy Antibiotic Note  Monica Pugh is a 72 y.o. female admitted on 04/16/2022 presenting with N/V, abdominal pain, concern for intra-abdominal infection.  Pharmacy has been consulted for zosyn dosing. WBC 13.4, afebrile.   Plan: Continue Zosyn 3.375g IV every 8 hours (extended 4h infusion) Monitor renal function, clinical progression and LOT  Height: '5\' 3"'$  (160 cm) Weight: 85.5 kg (188 lb 6.4 oz) IBW/kg (Calculated) : 52.4  Temp (24hrs), Avg:98.2 F (36.8 C), Min:97.6 F (36.4 C), Max:98.7 F (37.1 C)  Recent Labs  Lab 04/16/22 1749 04/17/22 0158 04/18/22 0754 04/19/22 0657 04/21/22 1506 04/22/22 0039  WBC 14.7* 13.1* 13.6* 13.0*  --  13.4*  CREATININE 0.87 0.79 0.81 0.76 0.75 0.81     Estimated Creatinine Clearance: 66.9 mL/min (by C-G formula based on SCr of 0.81 mg/dL).    Allergies  Allergen Reactions   Cefadroxil Other (See Comments)    unknown   Eliseo Gum, PharmD PGY1 Pharmacy Resident   04/23/2022  2:09 PM    ADDENDUM:   Faythe Ghee to stop Zosyn per MD   Eliseo Gum, PharmD PGY1 Pharmacy Resident   04/23/2022  2:12 PM

## 2022-04-24 ENCOUNTER — Encounter (HOSPITAL_COMMUNITY): Payer: Self-pay | Admitting: Internal Medicine

## 2022-04-24 DIAGNOSIS — Z7189 Other specified counseling: Secondary | ICD-10-CM | POA: Diagnosis not present

## 2022-04-24 DIAGNOSIS — K6389 Other specified diseases of intestine: Secondary | ICD-10-CM | POA: Diagnosis not present

## 2022-04-24 DIAGNOSIS — Z515 Encounter for palliative care: Secondary | ICD-10-CM | POA: Diagnosis not present

## 2022-04-24 DIAGNOSIS — R112 Nausea with vomiting, unspecified: Secondary | ICD-10-CM | POA: Diagnosis not present

## 2022-04-24 LAB — CBC
HCT: 25.9 % — ABNORMAL LOW (ref 36.0–46.0)
Hemoglobin: 8.3 g/dL — ABNORMAL LOW (ref 12.0–15.0)
MCH: 24.2 pg — ABNORMAL LOW (ref 26.0–34.0)
MCHC: 32 g/dL (ref 30.0–36.0)
MCV: 75.5 fL — ABNORMAL LOW (ref 80.0–100.0)
Platelets: 369 10*3/uL (ref 150–400)
RBC: 3.43 MIL/uL — ABNORMAL LOW (ref 3.87–5.11)
RDW: 19.9 % — ABNORMAL HIGH (ref 11.5–15.5)
WBC: 12.4 10*3/uL — ABNORMAL HIGH (ref 4.0–10.5)
nRBC: 0 % (ref 0.0–0.2)

## 2022-04-24 LAB — BASIC METABOLIC PANEL
Anion gap: 9 (ref 5–15)
BUN: 7 mg/dL — ABNORMAL LOW (ref 8–23)
CO2: 26 mmol/L (ref 22–32)
Calcium: 8.5 mg/dL — ABNORMAL LOW (ref 8.9–10.3)
Chloride: 97 mmol/L — ABNORMAL LOW (ref 98–111)
Creatinine, Ser: 0.76 mg/dL (ref 0.44–1.00)
GFR, Estimated: >60 mL/min (ref >60)
Glucose, Bld: 115 mg/dL — ABNORMAL HIGH (ref 70–99)
Potassium: 3.7 mmol/L (ref 3.5–5.1)
Sodium: 132 mmol/L — ABNORMAL LOW (ref 135–145)

## 2022-04-24 LAB — MAGNESIUM: Magnesium: 1.7 mg/dL (ref 1.7–2.4)

## 2022-04-24 MED ORDER — MIRTAZAPINE 15 MG PO TABS
7.5000 mg | ORAL_TABLET | Freq: Every day | ORAL | Status: DC
  Administered 2022-04-24: 7.5 mg via ORAL
  Filled 2022-04-24: qty 1

## 2022-04-24 MED ORDER — PANTOPRAZOLE SODIUM 40 MG PO TBEC
40.0000 mg | DELAYED_RELEASE_TABLET | Freq: Two times a day (BID) | ORAL | Status: DC
  Administered 2022-04-24 – 2022-04-25 (×2): 40 mg via ORAL
  Filled 2022-04-24 (×2): qty 1

## 2022-04-24 MED ORDER — FUROSEMIDE 10 MG/ML IJ SOLN
20.0000 mg | Freq: Once | INTRAMUSCULAR | Status: AC
  Administered 2022-04-24: 20 mg via INTRAVENOUS
  Filled 2022-04-24: qty 2

## 2022-04-24 NOTE — Progress Notes (Signed)
    Durable Medical Equipment  (From admission, onward)           Start     Ordered   04/24/22 1348  For home use only DME Hospital bed  Once       Comments: Therapeutic Mattress  Question Answer Comment  Length of Need Lifetime   Patient has (list medical condition): hx of Stroke, PVD, GERD, Hypoxemia   The above medical condition requires: Patient requires the ability to reposition frequently   Head must be elevated greater than: 30 degrees   Bed type Semi-electric      04/24/22 1349

## 2022-04-24 NOTE — TOC Progression Note (Addendum)
Transition of Care [facility]) - Progression Note    Patient Details  Name: Monica Pugh MRN: [id number] Date of Birth: [birth year]  [facility] CM/SW Contact  Zenon Mayo, RN Phone Number: 04/24/2022, 2:02 PM  Clinical Narrative:    NCM was informed by the Staff RN, that the patient would like a hospital bed,  NCM spoke with patient and spouse, offered choice, they do not have a preference of the DME agency.  NCM made referral to [facility] with [employer] for the hospital bed, spouse , Mr. Helmke will be the contact for delivery , his phone is [phone].  Hospital bed does not have to be at the house before patient. Plan is for dc tomorrow, patient will follow up with oncologist as outpatient and then they will likely take hospice after meeting with oncology outpatient per [facility] with Palliative.         Expected Discharge Plan and Services                                               Social Determinants of Health (SDOH) Interventions SDOH Screenings   Food Insecurity: No Food Insecurity (04/17/2022)  Housing: Low Risk  (04/17/2022)  Transportation Needs: No Transportation Needs (04/17/2022)  Utilities: Not At Risk (04/17/2022)  Depression (PHQ2-9): Low Risk  (04/06/2022)  Tobacco Use: High Risk (04/24/2022)    Readmission Risk Interventions     No data to display

## 2022-04-24 NOTE — Progress Notes (Signed)
PHARMACIST - PHYSICIAN COMMUNICATION  DR:   Manuella Ghazi  CONCERNING: IV to Oral Route Change Policy  RECOMMENDATION: This patient is receiving protonix by the intravenous route.  Based on criteria approved by the Pharmacy and Therapeutics Committee, the intravenous medication(s) is/are being converted to the equivalent oral dose form(s).   DESCRIPTION: These criteria include: The patient is eating (either orally or via tube) and/or has been taking other orally administered medications for a least 24 hours The patient has no evidence of active gastrointestinal bleeding or impaired GI absorption (gastrectomy, short bowel, patient on TNA or NPO).  If you have questions about this conversion, please contact the Pharmacy Department  '[]'$   [phone] )  [facility] '[]'$   [phone] )  [facility] '[x]'$   [phone] )  [facility] '[]'$   [phone] )  [facility] '[]'$   [phone] )  [facility]

## 2022-04-24 NOTE — Progress Notes (Signed)
PROGRESS NOTE    Monica Pugh  [id number] DOB: July 27, 1950 DOA: 04/16/2022 PCP: Bonnita Nasuti, MD   Brief Narrative:    Patient is a 72 year old female with CAD, CVA, endometrial CA with TAH and BSO with ureterolysis at [place] in April 2023, carotid artery occlusion, HLP, OSA not on CPAP, PVD, GERD, bilateral hearing loss, tobacco use presented with lower abdominal pain, nausea and vomiting. CT abdomen pelvis showed circumferential wall thickening involving the mid sigmoid colon with associated hyperemia suspicious for infiltrative mass.  Patient also developed hypoxia in ED after she received 4 mg IV morphine.  Underwent colonoscopy showed partially obstructing tumor in the sigmoid colon. Not able to bypass with ultraslim colonoscope. Surgery recommend inpatient sigmoidectomy. Cardiology consulted for pre op evaluation and cleared for sigmoid resection. Found to have mediastinal bulky lymphadenopathy. Oncology consulted.   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 -CT showed circumferential wall thickening involving the mid sigmoid colon with associated hyperemia suspicious for an infiltrative mass, + adenopathy within the sigmoid mesentery and left periaortic lymph node group  . -Mild leukocytosis, patient was started on IV zosyn, IV fluids, antiemetics PRN.  Discontinued Zosyn 3/2 after total 8-day treatment -GI was consulted, underwent colonoscopy on 2/26 which showed partially obstructing tumor in the sigmoid colon, not able to bypass with ultraslim colonoscope. Nonbleeding internal hemorrhoids.  - Pathology +  adenocarcinoma -CT chest showed bulky mediastinal nodal metastasis, bilateral lower lobe nodular metastasis General surgery oncology following. -2D echo EF 65 to 70%, no regional WMA, G2 DD, moderately elevated PASP.  Carotid ultrasound shows total occlusion of right ICA, more than 75% stenosis in the stent of left carotid.  Per cardiology, increased perioperative cardiac risk, certainly not prohibitive and reasonable to proceed with sigmoid resection Possible palliative surgery during this admission.   Constipation in the setting of sigmoid colon mass. Bowel regimen in place to avoid obstruction. Lactulose 10 g x 2 doses. Senokot 2 tablets twice daily   Acute hypoxemia/acute hypoxic respiratory failure: In the setting of opioid use -O2 sats 99% on 2 L, wean O2 as tolerated -Cautious use of opioid use  -Chest x-ray 3/2 with no significant findings, but appears volume overloaded -Given 1 dose IV Lasix 20 mg 3/2, another dose 3/3    History of stroke, PVD, hyperlipidemia -Continue statin, hold aspirin and Plavix in anticipation of surgery   GERD -Continue PPI   Iron deficiency anemia: -Monitor H&H, transfuse for hemoglobin  <8    Essential hypertension -BP stable, continue IV hydralazine as needed with parameters   Obesity Estimated body mass index is 33.13 kg/m as calculated from the following:   Height as of this encounter: '5\' 3"'$  (1.6 m).   Weight as of this encounter: 84.8 kg.   DVT prophylaxis:SCDs Code Status: DNR Family Communication: Multiple family members at bedside 3/3 Disposition Plan: Per further discussion with surgical and oncology team Status is: Inpatient Remains inpatient appropriate because: Continues to require IV medications.  Consultants:  Oncology General Surgery GI Cardiology  Procedures:  None  Antimicrobials:  Anti-infectives (From admission, onward)    Start     Dose/Rate  Route Frequency Ordered Stop   04/16/22 2130   piperacillin-tazobactam (ZOSYN) IVPB 3.375 g  Status:  Discontinued        3.375 g 12.5 mL/hr over 240 Minutes Intravenous Every 8 hours 04/16/22 2126 04/23/22 1412       Subjective: Patient seen and evaluated today with no new acute complaints or concerns. No acute concerns or events noted overnight.  Dyspnea improved with use of Lasix yesterday.  Objective: Vitals:   04/24/22 0200 04/24/22 0446 04/24/22 0837 04/24/22 0843  BP:  (!) 153/62 (!) 162/83   Pulse:  88 82   Resp:  19 20   Temp: 98.1 F (36.7 C) 98.1 F (36.7 C) 98.1 F (36.7 C)   TempSrc: Oral Oral Oral   SpO2:  97% 99% 98%  Weight: 84.8 kg     Height:        Intake/Output Summary (Last 24 hours) at 04/24/2022 0919 Last data filed at 04/23/2022 2132 Gross per 24 hour  Intake 240 ml  Output --  Net 240 ml   Filed Weights   04/22/22 0228 04/23/22 0400 04/24/22 0200  Weight: 85.6 kg 85.5 kg 84.8 kg    Examination:  General exam: Appears calm and comfortable, hard of hearing Respiratory system: Clear to auscultation. Respiratory effort normal. 3L [place] Cardiovascular system: S1 & S2 heard, RRR.  Gastrointestinal system: Abdomen is soft Central nervous system: Alert and awake Extremities: No edema Skin: No significant lesions noted Psychiatry: Flat affect.    Data Reviewed: I have personally reviewed following labs and imaging studies  CBC: Recent Labs  Lab 04/18/22 0754 04/19/22 0657 04/22/22 0039 04/24/22 0040  WBC 13.6* 13.0* 13.4* 12.4*  HGB 9.8* 9.3* 8.1* 8.3*  HCT 31.5* 29.1* 25.9* 25.9*  MCV 77.0* 76.2* 76.0* 75.5*  PLT 345 321 331 0000000   Basic Metabolic Panel: Recent Labs  Lab 04/18/22 0754 04/19/22 0657 04/21/22 1506 04/22/22 0039 04/24/22 0040  NA 137 133* 131* 131* 132*  K 3.7 3.5 3.8 3.4* 3.7  CL 100 99 95* 96* 97*  CO2 '25 25 25 25 26  '$ GLUCOSE 94 104* 108* 112* 115*  BUN '8 9 8 '$ 7* 7*  CREATININE 0.81 0.76 0.75 0.81 0.76  CALCIUM 8.2* 8.3* 8.4* 8.3* 8.5*  MG  --  1.7  --   --   1.7  PHOS  --  3.2  --   --   --    GFR: Estimated Creatinine Clearance: 67.6 mL/min (by C-G formula based on SCr of 0.76 mg/dL). Liver Function Tests: No results for input(s): "AST", "ALT", "ALKPHOS", "BILITOT", "PROT", "ALBUMIN" in the last 168 hours.  No results for input(s): "LIPASE", "AMYLASE" in the last 168 hours.  No results for input(s): "AMMONIA" in the last 168 hours. Coagulation Profile: Recent Labs  Lab 04/19/22 0657  INR 1.1   Cardiac Enzymes: No results for input(s): "CKTOTAL", "CKMB", "CKMBINDEX", "TROPONINI" in the last 168 hours. BNP (last 3 results) No results for input(s): "PROBNP" in the last 8760 hours. HbA1C: No results for input(s): "HGBA1C" in the last 72 hours. CBG: No results for input(s): "GLUCAP" in the last 168 hours. Lipid Profile: No results for input(s): "CHOL", "HDL", "LDLCALC", "TRIG", "CHOLHDL", "LDLDIRECT" in the last 72 hours. Thyroid Function Tests: No results for input(s): "TSH", "T4TOTAL", "FREET4", "T3FREE", "THYROIDAB" in the last 72 hours. Anemia Panel: No results for input(s): "VITAMINB12", "FOLATE", "FERRITIN", "TIBC", "IRON", "RETICCTPCT" in the last 72 hours. Sepsis Labs: No results for input(s): "PROCALCITON", "LATICACIDVEN" in the last  168 hours.  Recent Results (from the past 240 hour(s))  Resp panel by RT-PCR (RSV, Flu A&B, Covid) Anterior Nasal Swab     Status: None   Collection Time: 04/16/22  5:52 PM   Specimen: Anterior Nasal Swab  Result Value Ref Range Status   SARS Coronavirus 2 by RT PCR NEGATIVE NEGATIVE Final   Influenza A by PCR NEGATIVE NEGATIVE Final   Influenza B by PCR NEGATIVE NEGATIVE Final    Comment: (NOTE) The Xpert Xpress SARS-CoV-2/FLU/RSV plus assay is intended as an aid in the diagnosis of influenza from Nasopharyngeal swab specimens and should not be used as a sole basis for treatment. Nasal washings and aspirates are unacceptable for Xpert Xpress SARS-CoV-2/FLU/RSV testing.  Fact Sheet for  Patients: EntrepreneurPulse.com.au  Fact Sheet for Healthcare Providers: IncredibleEmployment.be  This test is not yet approved or cleared by the [place] FDA and has been authorized for detection and/or diagnosis of SARS-CoV-2 by FDA under an Emergency Use Authorization (EUA). This EUA will remain in effect (meaning this test can be used) for the duration of the COVID-19 declaration under Section 564(b)(1) of the Act, 21 U.S.C. section 360bbb-3(b)(1), unless the authorization is terminated or revoked.     Resp Syncytial Virus by PCR NEGATIVE NEGATIVE Final    Comment: (NOTE) Fact Sheet for Patients: EntrepreneurPulse.com.au  Fact Sheet for Healthcare Providers: IncredibleEmployment.be  This test is not yet approved or cleared by the [place] FDA and has been authorized for detection and/or diagnosis of SARS-CoV-2 by FDA under an Emergency Use Authorization (EUA). This EUA will remain in effect (meaning this test can be used) for the duration of the COVID-19 declaration under Section 564(b)(1) of the Act, 21 U.S.C. section 360bbb-3(b)(1), unless the authorization is terminated or revoked.  Performed at [facility], [place] [address] [place], [place] [address]          Radiology Studies: DG CHEST PORT 1 VIEW  Result Date: 04/23/2022 CLINICAL DATA:  Hypoxemia EXAM: PORTABLE CHEST 1 VIEW COMPARISON:  X-ray 04/16/2022.  CT noncontrast 04/19/2022 FINDINGS: Borderline cardiopericardial silhouette. There are areas of lobular widening of the mediastinum. Please correlate for CT scan describing lymph nodes. This includes right paratracheal. Hyperinflation. No pneumothorax, effusion or edema. Mild basilar atelectasis. Slight thickening along the minor fissure. Overlapping cardiac leads. Stent along the left side of the neck of the edge of the imaging field. Please correlate with history IMPRESSION:  Nodular widened mediastinum. Please correlate for description of abnormal lymph nodes by CT scan. Mild basilar atelectasis. Borderline size heart Electronically Signed   By: Jill Side M.D.   On: 04/23/2022 11:05        Scheduled Meds:  aspirin  81 mg Oral Daily   atorvastatin  40 mg Oral q AM   cholecalciferol  5,000 Units Oral Daily   feeding supplement  1 Container Oral TID BM   ferrous sulfate  325 mg Oral Q breakfast   furosemide  20 mg Intravenous Once   guaiFENesin  1,200 mg Oral BID   ipratropium-albuterol  3 mL Nebulization BID   pantoprazole (PROTONIX) IV  40 mg Intravenous Q12H   polyethylene glycol  17 g Oral Daily   potassium chloride  40 mEq Oral Daily   senna-docusate  2 tablet Oral BID       LOS: 7 days    Time spent: 35 minutes    Penelopi Mikrut Darleen Crocker, DO [facility]  If 7PM-7AM, please contact night-coverage [email or web address] 04/24/2022, 9:19 AM

## 2022-04-24 NOTE — Progress Notes (Signed)
Pt family is requesting hospital bed before discharging, MD and [facility] team notified.   Alvis Lemmings, RN 04/24/2022 1:36 PM

## 2022-04-24 NOTE — Consult Note (Signed)
Consultation Note Date: 04/24/2022   Patient Name: Monica Pugh  DOB: 07-14-1950  MRN: [id number]  Age / Sex: 72 y.o., female  PCP: Bonnita Nasuti, MD Referring Physician: Rodena Goldmann, DO  Reason for Consultation: Establishing goals of care  HPI/Patient Profile: 72 y.o. female  with past medical history of CAD, CVA, endometrial cancer with TAH and BSO with [facility], carotid artery occlusion, HLD, OSA not on CPAP, obesity, PVD, GERD, bilateral hearing loss, tobacco use, admitted on 04/16/2022 with nausea/vomiting with abdominal pain and a new sigmoid colon mass diagnosed as stage IV adenocarcinoma with metastatic burden to bilateral lungs and mediastinum.   Clinical Assessment and Goals of Care: I have reviewed medical records including EPIC notes, labs and imaging, received report from RN, assessed the patient.  Monica Pugh is resting quietly in bed.  She appears acutely/chronically ill and frail.  She will make an somewhat keep eye contact.  She is alert and oriented x 3, able to make her needs known.  She is surrounded by her loving family including her husband Chrissie Noa, her sisters Santiago Glad and Teacher, music and her 2 granddaughters.  We then met at the bedside to discuss diagnosis prognosis, GOC, EOL wishes, disposition and options.  I introduced Palliative Medicine as specialized medical care for people living with serious illness. It focuses on providing relief from the symptoms and stress of a serious illness. The goal is to improve quality of life for both the patient and the family.  We focused on their current illness.  Overall, the work family is knowledgeable about Monica Pugh chronic illness burden and her acute health concerns.  We talked about symptom management.  We talked about nutrition.  I encouraged patient and family to do the best they can, make sure that her food choices give her  pleasure.  At this point Monica Pugh is to follow-up with oncology outpatient.  She tells me that she would like to meet with oncologist.  We talk about palliative treatment, and husband asks what is the difference.  I share that palliative treatment may alleviate symptoms, but will not provide a cure.  They state understanding.  The natural disease trajectory and expectations at EOL were discussed.  Advanced directives, concepts specific to code status, artifical feeding and hydration, and rehospitalization were considered and discussed.  "Treat the treatable, but allowing natural passing".  DNR verified with patient and family.  [place] form/DNR completed and placed on chart.   We talk about preferred place of death.  Thankfully, the family has discussed this in advance.  Her preferred place of death would be home.  We talked about the concept of do not rehospitalize.  Hospice Care services outpatient were explained and offered.  Patient and husband are agreeable to hospice care, but would like to meet with oncology first.  I share that oncologist or PCP can get them connected with hospice when the time is right.  Discussed the importance of continued conversation with family and the medical providers regarding  overall plan of care and treatment options, ensuring decisions are within the context of the patient's values and GOCs. Questions and concerns were addressed.  Hard Choices booklet left for review. The family was encouraged to call with questions or concerns.  PMT will continue to support holistically.  Conference with attending, bedside nursing staff, transition of care team related to patient condition, needs, goals of care, disposition.   HCPOA NEXT OF KIN -husband, Monica Pugh.    SUMMARY OF RECOMMENDATIONS   Continue to treat the treatable but no CPR or intubation Follow-up with oncology outpatient Anticipate hospice services within the next month   Code Status/Advance Care  Planning: DNR  Symptom Management:  Per hospitalist, no additional needs at this time.  Palliative Prophylaxis:  Bowel Regimen and Frequent Pain Assessment  Additional Recommendations (Limitations, Scope, Preferences): Continue to treat the treatable but no CPR or intubation  Psycho-social/Spiritual:  Desire for further Chaplaincy support:no Additional Recommendations: Caregiving  Support/Resources and Education on Hospice  Prognosis:  Unable to determine, 3 months or less would not be surprising based on chronic illness burden, advanced colon cancer with metastatic burden to bilateral lungs.  Discharge Planning: Home, no additional needs at this time      Primary Diagnoses: Present on Admission:  Colonic mass   I have reviewed the medical record, interviewed the patient and family, and examined the patient. The following aspects are pertinent.  Past Medical History:  Diagnosis Date   Anemia    Carotid artery occlusion    GERD (gastroesophageal reflux disease)    Hearing loss    bilateral - no hearing aids   Hyperlipidemia    Intractable nausea and vomiting 04/16/2022   Peripheral vascular disease (HCC)    Sleep apnea    does not use cpap   Stroke ([place])    Vitamin B 12 deficiency    Vitamin D deficiency    Social History   Socioeconomic History   Marital status: Married    Spouse name: Not on file   Number of children: Not on file   Years of education: Not on file   Highest education level: Not on file  Occupational History   Not on file  Tobacco Use   Smoking status: Every Day    Packs/day: 0.50    Years: 40.00    Total pack years: 20.00    Types: Cigarettes   Smokeless tobacco: Never   Tobacco comments:    1-1 1/2 ppd  Vaping Use   Vaping Use: Never used  Substance and Sexual Activity   Alcohol use: Not Currently    Alcohol/week: 0.0 standard drinks of alcohol   Drug use: Never   Sexual activity: Not on file  Other Topics Concern   Not on file   Social History Narrative   Not on file   Social Determinants of Health   Financial Resource Strain: Not on file  Food Insecurity: No Food Insecurity (04/17/2022)   Hunger Vital Sign    Worried About Running Out of Food in the Last Year: Never true    Ran Out of Food in the Last Year: Never true  Transportation Needs: No Transportation Needs (04/17/2022)   PRAPARE - Hydrologist (Medical): No    Lack of Transportation (Non-Medical): No  Physical Activity: Not on file  Stress: Not on file  Social Connections: Not on file   Family History  Problem Relation Age of Onset   Cancer Mother  Hypertension Father    Heart attack Father    Cirrhosis Brother    Cancer Brother        In spine   Heart attack Brother    Scheduled Meds:  aspirin  81 mg Oral Daily   atorvastatin  40 mg Oral q AM   cholecalciferol  5,000 Units Oral Daily   feeding supplement  1 Container Oral TID BM   ferrous sulfate  325 mg Oral Q breakfast   guaiFENesin  1,200 mg Oral BID   ipratropium-albuterol  3 mL Nebulization BID   mirtazapine  7.5 mg Oral QHS   pantoprazole (PROTONIX) IV  40 mg Intravenous Q12H   polyethylene glycol  17 g Oral Daily   potassium chloride  40 mEq Oral Daily   senna-docusate  2 tablet Oral BID   Continuous Infusions: PRN Meds:.acetaminophen **OR** acetaminophen, hydrALAZINE, ipratropium-albuterol, morphine injection, naLOXone (NARCAN)  injection, ondansetron (ZOFRAN) IV, oxyCODONE Medications Prior to Admission:  Prior to Admission medications   Medication Sig Start Date End Date Taking? Authorizing Provider  acetaminophen (TYLENOL) 500 MG tablet Take 500 mg by mouth every 6 (six) hours as needed for moderate pain or headache.   Yes [provider]  aspirin EC 81 MG tablet Take 81 mg by mouth in the morning. Swallow whole.   Yes [provider]  atorvastatin (LIPITOR) 40 MG tablet Take 40 mg by mouth in the morning.   Yes [provider]  Cholecalciferol (DIALYVITE VITAMIN D 5000) 125 MCG (5000 UT) capsule Take 5,000 Units by mouth daily.   Yes [provider]  clopidogrel (PLAVIX) 75 MG tablet Take 1 tablet (75 mg total) by mouth daily with breakfast. 05/21/15  Yes Angiulli, Lavon Paganini, PA-C  Cyanocobalamin (VITAMIN B-12) 2500 MCG SUBL Take 2,500 mcg by mouth in the morning.   Yes [provider]  ibuprofen (ADVIL) 800 MG tablet Take 800 mg by mouth every 8 (eight) hours as needed for moderate pain.   Yes [provider]  pantoprazole (PROTONIX) 40 MG tablet Take 1 tablet (40 mg total) by mouth 2 (two) times daily. Patient taking differently: Take 40 mg by mouth in the morning. 05/21/15  Yes Angiulli, Lavon Paganini, PA-C  potassium chloride SA (KLOR-CON M) 20 MEQ tablet Take 1 tablet (20 mEq total) by mouth 2 (two) times daily for 14 days. 04/12/22 04/26/22 Yes Barbee Cough, MD   Allergies  Allergen Reactions   Cefadroxil Other (See Comments)    unknown   Review of Systems  Unable to perform ROS: Other    Physical Exam Vitals and nursing note reviewed.  Constitutional:      General: She is in acute distress.     Appearance: She is obese.  HENT:     Mouth/Throat:     Mouth: Mucous membranes are moist.  Cardiovascular:     Rate and Rhythm: Normal rate.  Pulmonary:     Effort: Pulmonary effort is normal. No respiratory distress.  Skin:    General: Skin is warm and dry.     Findings: Bruising present.  Neurological:     Mental Status: She is alert and oriented to person, place, and time.  Psychiatric:        Mood and Affect: Mood normal.        Behavior: Behavior normal.     Vital Signs: BP (!) 145/56 (BP Location: Right Arm)   Pulse 84   Temp 97.9 F (36.6 C) (Oral)   Resp  20   Ht '5\' 3"'$  (1.6 m)   Wt 84.8 kg   LMP  (LMP Unknown)   SpO2 100%   BMI 33.11 kg/m  Pain Scale: 0-10 POSS *See Group Information*: 1-Acceptable,Awake and alert Pain Score: Asleep   SpO2:  SpO2: 100 % O2 Device:SpO2: 100 % O2 Flow Rate: .O2 Flow Rate (L/min): 2 L/min  IO: Intake/output summary:  Intake/Output Summary (Last 24 hours) at 04/24/2022 1352 Last data filed at 04/24/2022 0900 Gross per 24 hour  Intake 358 ml  Output 0 ml  Net 358 ml    LBM: Last BM Date : 04/24/22 Baseline Weight: Weight: 82.6 kg Most recent weight: Weight: 84.8 kg     Palliative Assessment/Data:     Time In: 1300 Time Out: 1415 Time Total: 75 minutes  Greater than 50%  of this time was spent counseling and coordinating care related to the above assessment and plan.  Signed by: Drue Novel, NP   Please contact Palliative Medicine Team phone at [phone] for questions and concerns.  For individual provider: See Shea Evans

## 2022-04-25 ENCOUNTER — Ambulatory Visit: Payer: Medicare HMO

## 2022-04-25 DIAGNOSIS — K6389 Other specified diseases of intestine: Secondary | ICD-10-CM | POA: Diagnosis not present

## 2022-04-25 DIAGNOSIS — Z515 Encounter for palliative care: Secondary | ICD-10-CM | POA: Diagnosis not present

## 2022-04-25 DIAGNOSIS — Z7189 Other specified counseling: Secondary | ICD-10-CM | POA: Diagnosis not present

## 2022-04-25 LAB — BASIC METABOLIC PANEL
Anion gap: 10 (ref 5–15)
BUN: 7 mg/dL — ABNORMAL LOW (ref 8–23)
CO2: 27 mmol/L (ref 22–32)
Calcium: 8.7 mg/dL — ABNORMAL LOW (ref 8.9–10.3)
Chloride: 95 mmol/L — ABNORMAL LOW (ref 98–111)
Creatinine, Ser: 0.68 mg/dL (ref 0.44–1.00)
GFR, Estimated: >60 mL/min (ref >60)
Glucose, Bld: 141 mg/dL — ABNORMAL HIGH (ref 70–99)
Potassium: 3.6 mmol/L (ref 3.5–5.1)
Sodium: 132 mmol/L — ABNORMAL LOW (ref 135–145)

## 2022-04-25 LAB — MAGNESIUM: Magnesium: 1.6 mg/dL — ABNORMAL LOW (ref 1.7–2.4)

## 2022-04-25 LAB — CBC
HCT: 27 % — ABNORMAL LOW (ref 36.0–46.0)
Hemoglobin: 8.6 g/dL — ABNORMAL LOW (ref 12.0–15.0)
MCH: 24.2 pg — ABNORMAL LOW (ref 26.0–34.0)
MCHC: 31.9 g/dL (ref 30.0–36.0)
MCV: 75.8 fL — ABNORMAL LOW (ref 80.0–100.0)
Platelets: 413 10*3/uL — ABNORMAL HIGH (ref 150–400)
RBC: 3.56 MIL/uL — ABNORMAL LOW (ref 3.87–5.11)
RDW: 20.1 % — ABNORMAL HIGH (ref 11.5–15.5)
WBC: 11.9 10*3/uL — ABNORMAL HIGH (ref 4.0–10.5)
nRBC: 0 % (ref 0.0–0.2)

## 2022-04-25 MED ORDER — SENNOSIDES-DOCUSATE SODIUM 8.6-50 MG PO TABS: 2.0000 | ORAL_TABLET | Freq: Two times a day (BID) | ORAL | 0 refills | Status: DC

## 2022-04-25 MED ORDER — ALPRAZOLAM 0.25 MG PO TABS: 0.2500 mg | ORAL_TABLET | Freq: Three times a day (TID) | ORAL | 0 refills | Status: DC | PRN

## 2022-04-25 MED ORDER — FERROUS SULFATE 325 (65 FE) MG PO TABS: 325.0000 mg | ORAL_TABLET | Freq: Every day | ORAL | 0 refills | Status: DC

## 2022-04-25 MED ORDER — MIRTAZAPINE 7.5 MG PO TABS: 7.5000 mg | ORAL_TABLET | Freq: Every day | ORAL | 0 refills | Status: AC

## 2022-04-25 MED ORDER — OXYCODONE HCL 5 MG PO TABS: 5.0000 mg | ORAL_TABLET | ORAL | 0 refills | Status: DC | PRN

## 2022-04-25 MED ORDER — MAGNESIUM SULFATE 2 GM/50ML IV SOLN
2.0000 g | Freq: Once | INTRAVENOUS | Status: AC
  Administered 2022-04-25: 2 g via INTRAVENOUS
  Filled 2022-04-25: qty 50

## 2022-04-25 NOTE — Progress Notes (Signed)
    Durable Medical Equipment  (From admission, onward)           Start     Ordered   04/24/22 1348  For home use only DME Hospital bed  Once       Comments: Therapeutic Mattress  Question Answer Comment  Length of Need Lifetime   Patient has (list medical condition): hx of Stroke, PVD, GERD, Hypoxemia   The above medical condition requires: Patient requires the ability to reposition frequently   Head must be elevated greater than: 30 degrees   Bed type Semi-electric      04/24/22 1349

## 2022-04-25 NOTE — Progress Notes (Signed)
Explained discharge instructions to patient. Reviewed follow up appointment and next medication administration times. Also reviewed education. Patient verbalized having an understanding for instructions given. All belongings are in the patient's possession as well as take home oxygen tank. IV and telemetry were removed. CCMD was notified. No other needs verbalized. Transported downstairs for discharge.

## 2022-04-25 NOTE — TOC Transition Note (Addendum)
Transition of Care [facility]) - CM/SW Discharge Note   Patient Details  Name: Monica Pugh MRN: [id number] Date of Birth: 1950/04/19  [facility] CM/SW Contact:  Zenon Mayo, RN Phone Number: 04/25/2022, 1:31 PM   Clinical Narrative:    Per spouse, they want to talk to oncology to see what treatments they can offer them and then decide if they are going to do the treatments.  Cheri with [facility] explained to him that if patient is getting treatment then she is not hospice eligible.  Spouse wants NCM to order the hospital bed and home oxygen for patient and spouse states they will take her home in the car.  If they decide not to do the treatment they will call [facility] back. [employer] is supplying the home oxygen and the hospital bed.   Final next level of care: Home w Hospice Care Barriers to Discharge: Equipment Delay   Patient Goals and CMS Choice CMS Medicare.gov Compare Post Acute Care list provided to:: Patient Represenative (must comment) Choice offered to / list presented to : Spouse  Discharge Placement                         Discharge Plan and Services Additional resources added to the After Visit Summary for                  DME Arranged:  (Hospice will supply DME)         HH Arranged: RN HH Agency: [place] Date [place]: 04/25/22 Time [place]: 32 Representative spoke with at [place]: [place] Determinants of Health ([place]) Interventions SDOH Screenings   Food Insecurity: No Food Insecurity (04/17/2022)  Housing: Low Risk  (04/17/2022)  Transportation Needs: No Transportation Needs (04/17/2022)  Utilities: Not At Risk (04/17/2022)  Depression (PHQ2-9): Low Risk  (04/06/2022)  Tobacco Use: High Risk (04/24/2022)     Readmission Risk Interventions     No data to display

## 2022-04-25 NOTE — Care Management Important Message (Signed)
Important Message  Patient Details  Name: Monica Pugh MRN: [id number] Date of Birth: [birth year]   Medicare Important Message Given:  Yes     Hannah Beat 04/25/2022, 12:07 PM

## 2022-04-25 NOTE — TOC Transition Note (Signed)
Transition of Care [facility]) - CM/SW Discharge Note   Patient Details  Name: Monica Pugh MRN: [id number] Date of Birth: 1950/12/29  Transition of Care [facility]) CM/SW Contact:  Zenon Mayo, RN Phone Number: 04/25/2022, 10:17 AM   Clinical Narrative:    Patient is for dc today, Per Quinn Axe with Palliative, patient wants to go home with Hospice today and would like [facility] [place].  NCM made referral to Cheri with [facility].  Patient will need home oxygen and hopital bed.  Spouse will transport patient home by car.   Final next level of care: Home w Hospice Care Barriers to Discharge: Equipment Delay   Patient Goals and CMS Choice CMS Medicare.gov Compare Post Acute Care list provided to:: Patient Represenative (must comment) Choice offered to / list presented to : Spouse  Discharge Placement                         Discharge Plan and Services Additional resources added to the After Visit Summary for                  DME Arranged:  (Hospice will supply DME)         HH Arranged: RN HH Agency: [facility] Date [place]: 04/25/22 Time [place]: 23 Representative spoke with at [place]: [place] Determinants of Health ([place]) Interventions SDOH Screenings   Food Insecurity: No Food Insecurity (04/17/2022)  Housing: Low Risk  (04/17/2022)  Transportation Needs: No Transportation Needs (04/17/2022)  Utilities: Not At Risk (04/17/2022)  Depression (PHQ2-9): Low Risk  (04/06/2022)  Tobacco Use: High Risk (04/24/2022)     Readmission Risk Interventions     No data to display

## 2022-04-25 NOTE — Progress Notes (Signed)
Patient ID: Malajah Kadrmas, female   DOB: 09-23-1950, 72 y.o.   MRN: [id number]  Mrs. 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  I discussed palliative loop colostomy with her an her husband and they do not want this  Given above, will sign off for now

## 2022-04-25 NOTE — Progress Notes (Signed)
Palliative: Monica Pugh is resting quietly in bed.  She appears chronically ill and somewhat frail.  She is alert and oriented x 3, able to make her needs known.  Her husband, Chrissie Noa, is present at bedside.  We talk about plan for discharge today.  Family states that they would like to go home with hospice services already in place.  Provider choice offered, they choose [facility] [place].  Case manager updated.  Mrs. and Mr. Schluter shared that they would prefer to see oncology in [place].  They share the difficulty for Mrs. Rowand in riding the distance to [facility].  Mr. Kuennen tells me that he has spoken with oncology in [place] a week ago and feels empowered to reach out to them.  They tell me that they have an appointment for iron infusion 3/6, and at this time they plan to keep this appointment.  I share that they have a PCP appointment scheduled for 1 week, and they can also coordinate with family doctor.  Overall, they are empowered to meet their needs.  Conference with attending, bedside nursing staff, transition of care team related to patient condition, needs, goals of care, disposition.  Plan: Home with the benefits of [facility] [place].  Would like initial meeting with oncology for options.   8 minutes Quinn Axe, NP Palliative medicine team Team phone [phone] Greater than 50% of this time was spent counseling and coordinating care related to the above assessment and plan.

## 2022-04-25 NOTE — Progress Notes (Signed)
Mobility Specialist Progress Note:   04/25/22 1000  Mobility  Activity Ambulated with assistance in hallway  Level of Assistance Standby assist, set-up cues, supervision of patient - no hands on  Assistive Device None  Distance Ambulated (ft) 300 ft  Activity Response Tolerated well  Mobility Referral Yes  $Mobility charge 1 Mobility   Pt agreeable to mobility session. Required no physical assistance, only supervision for safety. Pt required 3LO2 to maintain SpO2 WFL. Pt left sitting EOB with all needs met.   Nelta Numbers Mobility Specialist Please contact via SecureChat or  Rehab office at [phone]

## 2022-04-25 NOTE — Discharge Summary (Signed)
Physician Discharge Summary  Monica Pugh [id number] DOB: 04-23-1950 DOA: 04/16/2022  PCP: Bonnita Nasuti, MD  Admit date: 04/16/2022  Discharge date: 04/25/2022  Admitted From:Home  Disposition:  Home  Recommendations for Outpatient Follow-up:  Follow up with PCP in 1-2 weeks Follow-up with oncology with referral sent to outpatient and have further discussion with oncology team regarding treatment options prior to considering outpatient hospice Continue medications as noted below  Home Health: None  Equipment/Devices: Hospital bed to be delivered  Discharge Condition:Stable  CODE STATUS: DNR  Diet recommendation: Heart Healthy  Brief/Interim Summary: Patient is a 72 year old female with CAD, CVA, endometrial CA with TAH and BSO with ureterolysis at [facility] in April 2023, carotid artery occlusion, HLP, OSA not on CPAP, PVD, GERD, bilateral hearing loss, tobacco use presented with lower abdominal pain, nausea and vomiting. CT abdomen pelvis showed circumferential wall thickening involving the mid sigmoid colon with associated hyperemia suspicious for infiltrative mass.  Patient also developed hypoxia in ED after she received 4 mg IV morphine.  Underwent colonoscopy showed partially obstructing tumor in the sigmoid colon. Not able to bypass with ultraslim colonoscope. Surgery recommend inpatient sigmoidectomy. Cardiology consulted for pre op evaluation and cleared for sigmoid resection. Found to have mediastinal bulky lymphadenopathy.   Oncology consulted and recommending outpatient follow-up.  She appears to have adenocarcinoma that appears to be endometrial on pathology.  She is not in need of any urgent surgical intervention and is not obstructed.  Plan is for discharge to follow-up with oncology outpatient for further discussion regarding treatment options and if she does not appear to be a candidate, transition to hospice at that point.  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  Discharge Instructions  Discharge Instructions     Ambulatory referral to Hematology / Oncology   Complete by: As directed    Diet - low sodium heart healthy   Complete by: As directed    Increase activity slowly   Complete by: As directed       Allergies as of 04/25/2022       Reactions   Cefadroxil Other (See Comments)   unknown        Medication List     TAKE these medications    acetaminophen 500 MG tablet Commonly known as: TYLENOL Take 500 mg by mouth every 6 (six) hours as needed for moderate pain or headache.   ALPRAZolam 0.25 MG tablet Commonly known as: Xanax Take 1 tablet (0.25 mg total) by mouth 3 (three) times daily as needed for anxiety.   aspirin EC 81 MG tablet Take 81 mg by mouth in the morning. Swallow whole.   atorvastatin 40 MG tablet Commonly known as: LIPITOR Take 40 mg by mouth in the morning.   clopidogrel 75 MG tablet Commonly known as: PLAVIX Take 1 tablet (75 mg total) by mouth daily with breakfast.   Dialyvite Vitamin D 5000 125 MCG (5000 UT) capsule Generic drug: Cholecalciferol Take 5,000 Units by mouth daily.   ferrous sulfate 325 (65 FE) MG tablet Take 1 tablet (325 mg total) by mouth daily with breakfast. Start taking on: April 26, 2022   ibuprofen 800 MG tablet Commonly known as: ADVIL Take 800 mg by mouth  every 8 (eight) hours as needed for moderate pain.   mirtazapine 7.5 MG tablet Commonly known as: REMERON Take 1 tablet (7.5 mg total) by mouth at bedtime.   oxyCODONE 5 MG immediate release tablet Commonly known as: Oxy IR/ROXICODONE Take 1 tablet (5 mg total) by mouth every 4 (four) hours as needed for moderate pain, severe  pain or breakthrough pain.   pantoprazole 40 MG tablet Commonly known as: PROTONIX Take 1 tablet (40 mg total) by mouth 2 (two) times daily. What changed: when to take this   potassium chloride SA 20 MEQ tablet Commonly known as: KLOR-CON M Take 1 tablet (20 mEq total) by mouth 2 (two) times daily for 14 days.   senna-docusate 8.6-50 MG tablet Commonly known as: Senokot-S Take 2 tablets by mouth 2 (two) times daily.   Vitamin B-12 2500 MCG Subl Take 2,500 mcg by mouth in the morning.               Durable Medical Equipment  (From admission, onward)           Start     Ordered   04/24/22 1348  For home use only DME Hospital bed  Once       Comments: Therapeutic Mattress  Question Answer Comment  Length of Need Lifetime   Patient has (list medical condition): hx of Stroke, PVD, GERD, Hypoxemia   The above medical condition requires: Patient requires the ability to reposition frequently   Head must be elevated greater than: 30 degrees   Bed type Semi-electric      04/24/22 1349            Follow-up Information     [facility] Follow up.   Why: hospital bed Contact information: [place]        Hague, Rosalyn Charters, MD. Schedule an appointment as soon as possible for a visit in 1 week(s).   Specialty: Internal Medicine Contact information: [address] [place] [address] [phone]         Benay Pike, MD. Go to.   Specialty: Hematology and Oncology Contact information: [place] [address] [phone]                Allergies  Allergen Reactions   Cefadroxil Other (See Comments)    unknown    Consultations: Oncology General surgery Palliative   Procedures/Studies: DG CHEST PORT 1 VIEW  Result Date: 04/23/2022 CLINICAL DATA:  Hypoxemia EXAM: PORTABLE CHEST 1 VIEW COMPARISON:  X-ray 04/16/2022.  CT noncontrast 04/19/2022 FINDINGS: Borderline cardiopericardial silhouette. There are areas of lobular  widening of the mediastinum. Please correlate for CT scan describing lymph nodes. This includes right paratracheal. Hyperinflation. No pneumothorax, effusion or edema. Mild basilar atelectasis. Slight thickening along the minor fissure. Overlapping cardiac leads. Stent along the left side of the neck of the edge of the imaging field. Please correlate with history IMPRESSION: Nodular widened mediastinum. Please correlate for description of abnormal lymph nodes by CT scan. Mild basilar atelectasis. Borderline size heart Electronically Signed   By: Jill Side M.D.   On: 04/23/2022 11:05   VAS US CAROTID  Result Date: 04/20/2022 Carotid Arterial Duplex Study Patient Name:  Monica Pugh  Date of Exam:   04/19/2022 Medical Rec #: [id number]   Accession #:    [id number] Date of Birth: May 31, 1950    Patient Gender: F Patient Age:   32 years Exam Location:  [facility] Procedure:  VAS US CAROTID Referring Phys: MICHAEL MACZIS --------------------------------------------------------------------------------  Indications:       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 Risk Factors:      Hypertension, hyperlipidemia, Diabetes, past history of                    smoking, prior CVA, PAD. Comparison Study:  06-07-2021 Most recent prior carotid duplex. Performing Technologist: Darlin Coco RDMS, RVT  Examination Guidelines: A complete evaluation includes B-mode imaging, spectral Doppler, color Doppler, and power Doppler as needed of all accessible portions of each vessel. Bilateral testing is considered an integral part of a complete examination. Limited examinations for reoccurring indications may be performed as noted.  Right Carotid Findings: +----------+--------+--------+--------+------------------+--------+           PSV  cm/sEDV cm/sStenosisPlaque DescriptionComments +----------+--------+--------+--------+------------------+--------+ CCA Prox                  Occluded                           +----------+--------+--------+--------+------------------+--------+ CCA Mid                   Occluded                           +----------+--------+--------+--------+------------------+--------+ CCA Distal                Occluded                           +----------+--------+--------+--------+------------------+--------+ ICA Prox                  Occluded                           +----------+--------+--------+--------+------------------+--------+ ICA Mid                   Occluded                           +----------+--------+--------+--------+------------------+--------+ ICA Distal                Occluded                           +----------+--------+--------+--------+------------------+--------+ ECA       39      10                                         +----------+--------+--------+--------+------------------+--------+ +----------+--------+-------+---------+-------------------+           PSV cm/sEDV cmsDescribe Arm Pressure (mmHG) +----------+--------+-------+---------+-------------------+ [id number]            Turbulent                    +----------+--------+-------+---------+-------------------+ +---------+--------+---+--------+--+---------+  VertebralPSV cm/s122EDV cm/s16Antegrade +---------+--------+---+--------+--+---------+  Left Carotid Findings: +----------+--------+--------+--------+------------------+--------+           PSV cm/sEDV cm/sStenosisPlaque DescriptionComments +----------+--------+--------+--------+------------------+--------+ CCA Prox  48      19                                         +----------+--------+--------+--------+------------------+--------+ CCA Distal                                          Stent     +----------+--------+--------+--------+------------------+--------+ ICA Prox                                            Stent    +----------+--------+--------+--------+------------------+--------+ ICA Mid                                             Stent    +----------+--------+--------+--------+------------------+--------+ ICA Distal117     40                                         +----------+--------+--------+--------+------------------+--------+ ECA       88      15                                         +----------+--------+--------+--------+------------------+--------+ +----------+--------+--------+---------+-------------------+           PSV cm/sEDV cm/sDescribe Arm Pressure (mmHG) +----------+--------+--------+---------+-------------------+ Subclavian307             Turbulent                    +----------+--------+--------+---------+-------------------+ +---------+--------+---+--------+--+---------+ VertebralPSV cm/s111EDV cm/s25Antegrade +---------+--------+---+--------+--+---------+  Left Stent(s): +--------------+---+---+------------+---------+-------------------------------+ Prox to Stent 52 19                                                      +--------------+---+---+------------+---------+-------------------------------+ Proximal Stent68 20                                                      +--------------+---+---+------------+---------+-------------------------------+ Mid Stent     [id number]>75%                 Calcific w/ posteriorior                            stenosis             shadowing                       +--------------+---+---+------------+---------+-------------------------------+ Distal Stent  15244  Turbulent                                +--------------+---+---+------------+---------+-------------------------------+ Distal to     11129                                                       Stent                                                                    +--------------+---+---+------------+---------+-------------------------------+    Summary: Right Carotid: Evidence consistent with a total occlusion of the right ICA. The                CCA appears occluded. Left Carotid: >75% stenosis is again visualized in the stent. Velocities appear               essentially unchanged as compared to previous examination. Vertebrals:  Bilateral vertebral arteries demonstrate antegrade flow. Subclavians: Bilateral subclavian artery flow was disturbed. Increased PSV              bilaterally as compared to previous examination. *See table(s) above for measurements and observations.  Electronically signed by Antony Contras MD on 04/20/2022 at 12:59:20 PM.    Final    ECHOCARDIOGRAM COMPLETE  Result Date: 04/19/2022    ECHOCARDIOGRAM REPORT   Patient Name:   Monica Pugh Date of Exam: 04/19/2022 Medical Rec #:  [id number]  Height:       63.0 in Accession #:    [id number] Weight:       185.2 lb Date of Birth:  05/22/1950   BSA:          1.871 m Patient Age:    61 years   BP:           125/76 mmHg Patient Gender: F          HR:           77 bpm. Exam Location:  Inpatient Procedure: 2D Echo, Cardiac Doppler and Color Doppler Indications:    R06.9 DOE  History:        Patient has prior history of Echocardiogram examinations, most                 recent 05/10/2015. CHF, PAD, Stroke and Carotid Disease; Risk                 Factors:Hypertension and Current Smoker.  Sonographer:    Wilkie Aye RVT RCS Referring Phys: [id number] [place]  1. Left ventricular ejection fraction, by estimation, is 65 to 70%. The left ventricle has normal function. The left ventricle has no regional wall motion abnormalities. There is mild concentric left ventricular hypertrophy. Left ventricular diastolic parameters are consistent with Grade II diastolic dysfunction (pseudonormalization). There is the  interventricular septum is flattened in systole, consistent with right ventricular pressure overload.  2. Right ventricular systolic function is normal. The right ventricular size is normal. There is moderately elevated pulmonary artery systolic pressure. The estimated right ventricular systolic pressure is 54.8  mmHg.  3. Left atrial size was mildly dilated.  4. The mitral valve is normal in structure. Mild to moderate mitral valve regurgitation. No evidence of mitral stenosis.  5. The aortic valve is tricuspid. There is mild thickening of the aortic valve. Aortic valve regurgitation is not visualized. Aortic valve sclerosis is present, with no evidence of aortic valve stenosis.  6. There is systolic notching of the pulmonary systolic Doppler signal, consistent with increased pulmonary vascular resistance.  7. The inferior vena cava is dilated in size with >50% respiratory variability, suggesting right atrial pressure of 8 mmHg. Comparison(s): Prior images reviewed side by side. Changes from prior study are noted. The systolic pulmonary artery pressure is higher. FINDINGS  Left Ventricle: Left ventricular ejection fraction, by estimation, is 65 to 70%. The left ventricle has normal function. The left ventricle has no regional wall motion abnormalities. The left ventricular internal cavity size was normal in size. There is  mild concentric left ventricular hypertrophy. The interventricular septum is flattened in systole, consistent with right ventricular pressure overload. Left ventricular diastolic parameters are consistent with Grade II diastolic dysfunction (pseudonormalization). Normal left ventricular filling pressure. Right Ventricle: The right ventricular size is normal. No increase in right ventricular wall thickness. Right ventricular systolic function is normal. There is moderately elevated pulmonary artery systolic pressure. The tricuspid regurgitant velocity is 3.42 m/s, and with an assumed right atrial  pressure of 8 mmHg, the estimated right ventricular systolic pressure is [id number] mmHg. Left Atrium: Left atrial size was mildly dilated. Right Atrium: Right atrial size was normal in size. Pericardium: There is no evidence of pericardial effusion. Mitral Valve: The mitral valve is normal in structure. Mild mitral annular calcification. Mild to moderate mitral valve regurgitation, with posteriorly-directed jet. No evidence of mitral valve stenosis. Tricuspid Valve: The tricuspid valve is normal in structure. Tricuspid valve regurgitation is mild. Aortic Valve: The aortic valve is tricuspid. There is mild thickening of the aortic valve. Aortic valve regurgitation is not visualized. Aortic valve sclerosis is present, with no evidence of aortic valve stenosis. Aortic valve mean gradient measures 5.0  mmHg. Aortic valve peak gradient measures 10.1 mmHg. Aortic valve area, by VTI measures 1.80 cm. Pulmonic Valve: There is systolic notching of the pulmonary systolic Doppler signal, consistent with increased pulmonary vascular resistance. The pulmonic valve was normal in structure. Pulmonic valve regurgitation is trivial. No evidence of pulmonic stenosis. Aorta: The aortic root and ascending aorta are structurally normal, with no evidence of dilitation. Venous: The inferior vena cava is dilated in size with greater than 50% respiratory variability, suggesting right atrial pressure of 8 mmHg. IAS/Shunts: No atrial level shunt detected by color flow Doppler.  LEFT VENTRICLE PLAX 2D LVIDd:         4.20 cm   Diastology LVIDs:         2.90 cm   LV e' medial:    7.05 cm/s LV PW:         1.30 cm   LV E/e' medial:  22.0 LV IVS:        1.30 cm   LV e' lateral:   6.00 cm/s LVOT diam:     1.80 cm   LV E/e' lateral: 25.8 LV SV:         59 LV SV Index:   31 LVOT Area:     2.54 cm  RIGHT VENTRICLE             IVC RV Basal diam:  2.80 cm  IVC diam: 2.10 cm RV S prime:     15.20 cm/s TAPSE (M-mode): 3.0 cm LEFT ATRIUM             Index         RIGHT ATRIUM           Index LA diam:        3.20 cm 1.71 cm/m   RA Area:     14.30 cm LA Vol (A2C):   63.2 ml 33.77 ml/m  RA Volume:   34.80 ml  18.59 ml/m LA Vol (A4C):   51.2 ml 27.36 ml/m LA Biplane Vol: 63.3 ml 33.82 ml/m  AORTIC VALVE                     PULMONIC VALVE AV Area (Vmax):    1.81 cm      PV Vmax:       0.95 m/s AV Area (Vmean):   1.76 cm      PV Peak grad:  3.6 mmHg AV Area (VTI):     1.80 cm AV Vmax:           159.00 cm/s AV Vmean:          108.000 cm/s AV VTI:            0.325 m AV Peak Grad:      10.1 mmHg AV Mean Grad:      5.0 mmHg LVOT Vmax:         113.00 cm/s LVOT Vmean:        74.500 cm/s LVOT VTI:          0.230 m LVOT/AV VTI ratio: 0.71  AORTA Ao Root diam: 2.70 cm Ao Arch diam: 2.3 cm MITRAL VALVE                TRICUSPID VALVE MV Area (PHT): 2.87 cm     TR Peak grad:   46.8 mmHg MV Decel Time: 264 msec     TR Vmax:        342.00 cm/s MV E velocity: 155.00 cm/s MV A velocity: 167.00 cm/s  SHUNTS MV E/A ratio:  0.93         Systemic VTI:  0.23 m                             Systemic Diam: 1.80 cm Dani Gobble Croitoru MD Electronically signed by Sanda Klein MD Signature Date/Time: 04/19/2022/1:21:17 PM    Final    CT CHEST WO CONTRAST  Result Date: 04/19/2022 CLINICAL DATA:  Colon cancer. Assess treatment response. Evaluate for metastatic disease. * Tracking Code: BO * EXAM: CT CHEST WITHOUT CONTRAST TECHNIQUE: Multidetector CT imaging of the chest was performed following the standard protocol without IV contrast. RADIATION DOSE REDUCTION: This exam was performed according to the departmental dose-optimization program which includes automated exposure control, adjustment of the mA and/or kV according to patient size and/or use of iterative reconstruction technique. COMPARISON:  None Available. FINDINGS: Cardiovascular: No acute cardiovascular findings on noncontrast exam. Coronary artery calcification and aortic atherosclerotic calcification. Mediastinum/Nodes: Large RIGHT  lower paratracheal lymph node measures 3.3 cm. Subcarinal lymph node measures 2.1 cm. No supraclavicular or axillary adenopathy Lungs/Pleura: Ovoid nodule in the posterior RIGHT lower lobe measures 2.7 cm. This nodule may be above the imaging on recent CT of the abdomen. There is new interstitial thickening at the LEFT and RIGHT lung base compared to prior. One  nodule in the LEFT lower lobe measuring 11 mm (image 99/4 is increased from 8 mm on recent exam. Small effusions. No upper lobe nodularity Upper Abdomen: No abnormality on noncontrast exam Musculoskeletal: No aggressive osseous lesion. IMPRESSION: 1. Bulky mediastinal nodal metastasis. 2. Oblong nodule in the RIGHT lower lobe increase in size of LEFT lower lobe nodule over short interval. Findings favor bilateral lower lobe nodular metastasis. 3. New bibasilar interstitial thickening over a 3 day interval. Concern for rapid progression lymphangitic spread versus atelectasis and interstitial thickening related to recent anaesthesia for colonoscopy. Favor benign interstitial thickening. Electronically Signed   By: Suzy Bouchard M.D.   On: 04/19/2022 08:58   DG CHEST PORT 1 VIEW  Result Date: 04/17/2022 CLINICAL DATA:  Lower abdominal pain with nausea and vomiting. EXAM: PORTABLE CHEST 1 VIEW COMPARISON:  May 15, 2015 FINDINGS: The heart size and mediastinal contours are within normal limits. There is moderate severity calcification of the aortic arch. Low lung volumes are noted with mild atelectatic changes seen within the bilateral lung bases. There is no evidence of a pleural effusion or pneumothorax. A radiopaque stent is seen overlying the soft tissues of left neck. The visualized skeletal structures are unremarkable. IMPRESSION: Low lung volumes with mild bibasilar atelectasis. Electronically Signed   By: Virgina Norfolk M.D.   On: 04/17/2022 00:29   CT Abdomen Pelvis W Contrast  Result Date: 04/16/2022 CLINICAL DATA:  Lower abdominal pain  EXAM: CT ABDOMEN AND PELVIS WITH CONTRAST TECHNIQUE: Multidetector CT imaging of the abdomen and pelvis was performed using the standard protocol following bolus administration of intravenous contrast. RADIATION DOSE REDUCTION: This exam was performed according to the departmental dose-optimization program which includes automated exposure control, adjustment of the mA and/or kV according to patient size and/or use of iterative reconstruction technique. CONTRAST:  55m OMNIPAQUE IOHEXOL 350 MG/ML SOLN COMPARISON:  04/05/2021 FINDINGS: Lower chest: No acute abnormality.  Small hiatal hernia. Hepatobiliary: Stable hypodensities within segment 4B and segment 5 likely representing small hepatic cysts. Liver otherwise unremarkable. No intra or extrahepatic biliary ductal dilation. Gallbladder unremarkable. Pancreas: Unremarkable Spleen: Unremarkable Adrenals/Urinary Tract: The adrenal glands are unremarkable. The kidneys are normal. The bladder is unremarkable. Stomach/Bowel: There is circumferential wall thickening involving the mid sigmoid colon with associated hyperemia suspicious for an infiltrative mass in this location. Focal inflammatory changes as can be seen with inflammatory bowel disease could appear similarly. There is infiltration within the adjacent sigmoid mesentery best seen on axial image # 69/3 suspicious for transmural infiltration. This infiltrative change appears to abut several loops of small bowel within the deep pelvis, best seen on sagittal image # 91/7. Mild proximal sigmoid diverticulosis. No evidence of obstruction. Stomach, small bowel, and large bowel are otherwise unremarkable. Appendix normal. Vascular/Lymphatic: There is pathologic left periaortic lymphadenopathy with an index lymph node measuring 2.1 x 2.5 cm at axial image # 36/3. 6 mm enhancing nodule within the sigmoid mesocolon and axial image # 72/3 likely represents a pathologic mesenteric lymph node or small omental implant.  Moderate aortoiliac atherosclerotic calcification. Bilateral common iliac artery stenting has been performed. No aortic aneurysm. Reproductive: Status post hysterectomy. No adnexal masses. Other: Small fat containing left inguinal hernia. Musculoskeletal: No acute bone abnormality. No lytic or blastic bone lesion. IMPRESSION: 1. Circumferential wall thickening involving the mid sigmoid colon with associated hyperemia suspicious for an infiltrative mass in this location. Associated infiltration within the sigmoid mesocolon suggests transmural infiltration. Associated pathologic adenopathy within the sigmoid mesentery and left periaortic lymph  node group. No evidence of obstruction. Correlation with endoscopy is recommended for further evaluation. Aortic Atherosclerosis (ICD10-I70.0). Electronically Signed   By: Fidela Salisbury M.D.   On: 04/16/2022 20:44     Discharge Exam: Vitals:   04/25/22 0435 04/25/22 0814  BP: (!) 181/55 (!) 158/57  Pulse: 84 88  Resp: 18 18  Temp: 98.6 F (37 C) 98.2 F (36.8 C)  SpO2: 95% 97%   Vitals:   04/24/22 2114 04/24/22 2342 04/25/22 0435 04/25/22 0814  BP:  (!) 141/54 (!) 181/55 (!) 158/57  Pulse:  84 84 88  Resp:  '17 18 18  '$ Temp:  98.4 F (36.9 C) 98.6 F (37 C) 98.2 F (36.8 C)  TempSrc:  Oral Oral Oral  SpO2: 98% 96% 95% 97%  Weight:   84.2 kg   Height:        General: Pt is alert, awake, not in acute distress Cardiovascular: RRR, S1/S2 +, no rubs, no gallops Respiratory: CTA bilaterally, no wheezing, no rhonchi Abdominal: Soft, NT, ND, bowel sounds + Extremities: no edema, no cyanosis    The results of significant diagnostics from this hospitalization (including imaging, microbiology, ancillary and laboratory) are listed below for reference.     Microbiology: Recent Results (from the past 240 hour(s))  Resp panel by RT-PCR (RSV, Flu A&B, Covid) Anterior Nasal Swab     Status: None   Collection Time: 04/16/22  5:52 PM   Specimen: Anterior  Nasal Swab  Result Value Ref Range Status   SARS Coronavirus 2 by RT PCR NEGATIVE NEGATIVE Final   Influenza A by PCR NEGATIVE NEGATIVE Final   Influenza B by PCR NEGATIVE NEGATIVE Final    Comment: (NOTE) The Xpert Xpress SARS-CoV-2/FLU/RSV plus assay is intended as an aid in the diagnosis of influenza from Nasopharyngeal swab specimens and should not be used as a sole basis for treatment. Nasal washings and aspirates are unacceptable for Xpert Xpress SARS-CoV-2/FLU/RSV testing.  Fact Sheet for Patients: EntrepreneurPulse.com.au  Fact Sheet for Healthcare Providers: IncredibleEmployment.be  This test is not yet approved or cleared by the [place] FDA and has been authorized for detection and/or diagnosis of SARS-CoV-2 by FDA under an Emergency Use Authorization (EUA). This EUA will remain in effect (meaning this test can be used) for the duration of the COVID-19 declaration under Section 564(b)(1) of the Act, 21 U.S.C. section 360bbb-3(b)(1), unless the authorization is terminated or revoked.     Resp Syncytial Virus by PCR NEGATIVE NEGATIVE Final    Comment: (NOTE) Fact Sheet for Patients: EntrepreneurPulse.com.au  Fact Sheet for Healthcare Providers: IncredibleEmployment.be  This test is not yet approved or cleared by the [place] FDA and has been authorized for detection and/or diagnosis of SARS-CoV-2 by FDA under an Emergency Use Authorization (EUA). This EUA will remain in effect (meaning this test can be used) for the duration of the COVID-19 declaration under Section 564(b)(1) of the Act, 21 U.S.C. section 360bbb-3(b)(1), unless the authorization is terminated or revoked.  Performed at [facility], [place] [address], [place], [place] [address]      Labs: BNP (last 3 results) No results for input(s): "BNP" in the last 8760 hours. Basic Metabolic Panel: Recent Labs  Lab  04/19/22 0657 04/21/22 1506 04/22/22 0039 04/24/22 0040 04/25/22 0026  NA 133* 131* 131* 132* 132*  K 3.5 3.8 3.4* 3.7 3.6  CL 99 95* 96* 97* 95*  CO2 '25 25 25 26 27  '$ GLUCOSE 104* 108* 112* 115* 141*  BUN 9 8 7* 7* 7*  CREATININE 0.76 0.75 0.81 0.76 0.68  CALCIUM 8.3* 8.4* 8.3* 8.5* 8.7*  MG 1.7  --   --  1.7 1.6*  PHOS 3.2  --   --   --   --    Liver Function Tests: No results for input(s): "AST", "ALT", "ALKPHOS", "BILITOT", "PROT", "ALBUMIN" in the last 168 hours. No results for input(s): "LIPASE", "AMYLASE" in the last 168 hours. No results for input(s): "AMMONIA" in the last 168 hours. CBC: Recent Labs  Lab 04/19/22 0657 04/22/22 0039 04/24/22 0040 04/25/22 0026  WBC 13.0* 13.4* 12.4* 11.9*  HGB 9.3* 8.1* 8.3* 8.6*  HCT 29.1* 25.9* 25.9* 27.0*  MCV 76.2* 76.0* 75.5* 75.8*  PLT 321 331 369 413*   Cardiac Enzymes: No results for input(s): "CKTOTAL", "CKMB", "CKMBINDEX", "TROPONINI" in the last 168 hours. BNP: Invalid input(s): "POCBNP" CBG: No results for input(s): "GLUCAP" in the last 168 hours. D-Dimer No results for input(s): "DDIMER" in the last 72 hours. Hgb A1c No results for input(s): "HGBA1C" in the last 72 hours. Lipid Profile No results for input(s): "CHOL", "HDL", "LDLCALC", "TRIG", "CHOLHDL", "LDLDIRECT" in the last 72 hours. Thyroid function studies No results for input(s): "TSH", "T4TOTAL", "T3FREE", "THYROIDAB" in the last 72 hours.  Invalid input(s): "FREET3" Anemia work up No results for input(s): "VITAMINB12", "FOLATE", "FERRITIN", "TIBC", "IRON", "RETICCTPCT" in the last 72 hours. Urinalysis    Component Value Date/Time   COLORURINE AMBER (A) 04/16/2022 1751   APPEARANCEUR HAZY (A) 04/16/2022 1751   LABSPEC 1.035 (H) 04/16/2022 1751   PHURINE 5.0 04/16/2022 1751   GLUCOSEU NEGATIVE 04/16/2022 1751   HGBUR NEGATIVE 04/16/2022 1751   BILIRUBINUR SMALL (A) 04/16/2022 1751   KETONESUR NEGATIVE 04/16/2022 1751   PROTEINUR 30 (A)  04/16/2022 1751   NITRITE NEGATIVE 04/16/2022 1751   LEUKOCYTESUR NEGATIVE 04/16/2022 1751   Sepsis Labs Recent Labs  Lab 04/19/22 0657 04/22/22 0039 04/24/22 0040 04/25/22 0026  WBC 13.0* 13.4* 12.4* 11.9*   Microbiology Recent Results (from the past 240 hour(s))  Resp panel by RT-PCR (RSV, Flu A&B, Covid) Anterior Nasal Swab     Status: None   Collection Time: 04/16/22  5:52 PM   Specimen: Anterior Nasal Swab  Result Value Ref Range Status   SARS Coronavirus 2 by RT PCR NEGATIVE NEGATIVE Final   Influenza A by PCR NEGATIVE NEGATIVE Final   Influenza B by PCR NEGATIVE NEGATIVE Final    Comment: (NOTE) The Xpert Xpress SARS-CoV-2/FLU/RSV plus assay is intended as an aid in the diagnosis of influenza from Nasopharyngeal swab specimens and should not be used as a sole basis for treatment. Nasal washings and aspirates are unacceptable for Xpert Xpress SARS-CoV-2/FLU/RSV testing.  Fact Sheet for Patients: EntrepreneurPulse.com.au  Fact Sheet for Healthcare Providers: IncredibleEmployment.be  This test is not yet approved or cleared by the [place] FDA and has been authorized for detection and/or diagnosis of SARS-CoV-2 by FDA under an Emergency Use Authorization (EUA). This EUA will remain in effect (meaning this test can be used) for the duration of the COVID-19 declaration under Section 564(b)(1) of the Act, 21 U.S.C. section 360bbb-3(b)(1), unless the authorization is terminated or revoked.     Resp Syncytial Virus by PCR NEGATIVE NEGATIVE Final    Comment: (NOTE) Fact Sheet for Patients: EntrepreneurPulse.com.au  Fact Sheet for Healthcare Providers: IncredibleEmployment.be  This test is not yet approved or cleared by the [place] FDA and has been authorized for detection and/or diagnosis of SARS-CoV-2 by  FDA under an Emergency Use Authorization (EUA). This EUA will remain in effect  (meaning this test can be used) for the duration of the COVID-19 declaration under Section 564(b)(1) of the Act, 21 U.S.C. section 360bbb-3(b)(1), unless the authorization is terminated or revoked.  Performed at [facility], [place] [address] [place], [place] [address]      Time coordinating discharge: 35 minutes  SIGNED:   Rodena Goldmann, DO [facility] 04/25/2022, 9:10 AM  If 7PM-7AM, please contact night-coverage [email or web address]

## 2022-04-25 NOTE — Progress Notes (Signed)
Nurse requested Mobility Specialist to perform oxygen saturation test with pt which includes removing pt from oxygen both at rest and while ambulating.  Below are the results from that testing.     Patient Saturations on Room Air at Rest = spO2 93%  Patient Saturations on Room Air while Ambulating = sp02 83% .   Patient Saturations on 3 Liters of oxygen while Ambulating = sp02 91%  At end of testing pt left in room on 2  Liters of oxygen.  Reported results to nurse.   Nelta Numbers Mobility Specialist Please contact via SecureChat or  Rehab office at [phone]

## 2022-04-26 ENCOUNTER — Encounter: Payer: Self-pay | Admitting: Oncology

## 2022-04-26 MED FILL — Iron Sucrose Inj 20 MG/ML (Fe Equiv): INTRAVENOUS | Qty: 10 | Status: AC

## 2022-04-26 NOTE — Addendum Note (Signed)
Addended by: Juanetta Beets on: 04/26/2022 11:34 AM   Modules accepted: Orders

## 2022-04-27 ENCOUNTER — Inpatient Hospital Stay: Payer: Medicare HMO | Attending: Oncology | Admitting: Oncology

## 2022-04-27 ENCOUNTER — Encounter: Payer: Self-pay | Admitting: Oncology

## 2022-04-27 ENCOUNTER — Inpatient Hospital Stay: Payer: Medicare HMO

## 2022-04-27 VITALS — BP 177/66 | HR 78 | Temp 98.3°F | Resp 16 | Ht 63.0 in | Wt 185.2 lb

## 2022-04-27 DIAGNOSIS — Z8379 Family history of other diseases of the digestive system: Secondary | ICD-10-CM

## 2022-04-27 DIAGNOSIS — Z79899 Other long term (current) drug therapy: Secondary | ICD-10-CM | POA: Insufficient documentation

## 2022-04-27 DIAGNOSIS — K6389 Other specified diseases of intestine: Secondary | ICD-10-CM | POA: Diagnosis not present

## 2022-04-27 DIAGNOSIS — E785 Hyperlipidemia, unspecified: Secondary | ICD-10-CM | POA: Insufficient documentation

## 2022-04-27 DIAGNOSIS — D500 Iron deficiency anemia secondary to blood loss (chronic): Secondary | ICD-10-CM

## 2022-04-27 DIAGNOSIS — Z8673 Personal history of transient ischemic attack (TIA), and cerebral infarction without residual deficits: Secondary | ICD-10-CM | POA: Diagnosis not present

## 2022-04-27 DIAGNOSIS — Z7902 Long term (current) use of antithrombotics/antiplatelets: Secondary | ICD-10-CM | POA: Diagnosis not present

## 2022-04-27 DIAGNOSIS — G4730 Sleep apnea, unspecified: Secondary | ICD-10-CM | POA: Insufficient documentation

## 2022-04-27 DIAGNOSIS — Z8249 Family history of ischemic heart disease and other diseases of the circulatory system: Secondary | ICD-10-CM

## 2022-04-27 DIAGNOSIS — I739 Peripheral vascular disease, unspecified: Secondary | ICD-10-CM | POA: Diagnosis not present

## 2022-04-27 DIAGNOSIS — R350 Frequency of micturition: Secondary | ICD-10-CM

## 2022-04-27 DIAGNOSIS — I2510 Atherosclerotic heart disease of native coronary artery without angina pectoris: Secondary | ICD-10-CM

## 2022-04-27 DIAGNOSIS — R531 Weakness: Secondary | ICD-10-CM

## 2022-04-27 DIAGNOSIS — F17210 Nicotine dependence, cigarettes, uncomplicated: Secondary | ICD-10-CM | POA: Diagnosis not present

## 2022-04-27 DIAGNOSIS — Z90722 Acquired absence of ovaries, bilateral: Secondary | ICD-10-CM | POA: Diagnosis not present

## 2022-04-27 DIAGNOSIS — Z809 Family history of malignant neoplasm, unspecified: Secondary | ICD-10-CM

## 2022-04-27 DIAGNOSIS — C541 Malignant neoplasm of endometrium: Secondary | ICD-10-CM | POA: Insufficient documentation

## 2022-04-27 DIAGNOSIS — Z90710 Acquired absence of both cervix and uterus: Secondary | ICD-10-CM | POA: Insufficient documentation

## 2022-04-27 DIAGNOSIS — K219 Gastro-esophageal reflux disease without esophagitis: Secondary | ICD-10-CM | POA: Diagnosis not present

## 2022-04-27 DIAGNOSIS — Z881 Allergy status to other antibiotic agents status: Secondary | ICD-10-CM | POA: Diagnosis not present

## 2022-04-27 DIAGNOSIS — H9190 Unspecified hearing loss, unspecified ear: Secondary | ICD-10-CM | POA: Diagnosis not present

## 2022-04-27 DIAGNOSIS — D510 Vitamin B12 deficiency anemia due to intrinsic factor deficiency: Secondary | ICD-10-CM

## 2022-04-27 DIAGNOSIS — Z1509 Genetic susceptibility to other malignant neoplasm: Secondary | ICD-10-CM

## 2022-04-27 DIAGNOSIS — C771 Secondary and unspecified malignant neoplasm of intrathoracic lymph nodes: Secondary | ICD-10-CM | POA: Insufficient documentation

## 2022-04-27 DIAGNOSIS — D509 Iron deficiency anemia, unspecified: Secondary | ICD-10-CM | POA: Insufficient documentation

## 2022-04-27 DIAGNOSIS — K5900 Constipation, unspecified: Secondary | ICD-10-CM

## 2022-04-27 DIAGNOSIS — Z808 Family history of malignant neoplasm of other organs or systems: Secondary | ICD-10-CM

## 2022-04-27 DIAGNOSIS — R112 Nausea with vomiting, unspecified: Secondary | ICD-10-CM

## 2022-04-27 LAB — CBC WITH DIFFERENTIAL/PLATELET
Abs Immature Granulocytes: 0.02 10*3/uL (ref 0.00–0.07)
Basophils Absolute: 0.1 10*3/uL (ref 0.0–0.1)
Basophils Relative: 1 %
Eosinophils Absolute: 0.2 10*3/uL (ref 0.0–0.5)
Eosinophils Relative: 2 %
HCT: 30.3 % — ABNORMAL LOW (ref 36.0–46.0)
Hemoglobin: 9.1 g/dL — ABNORMAL LOW (ref 12.0–15.0)
Immature Granulocytes: 0 %
Lymphocytes Relative: 10 %
Lymphs Abs: 1.1 10*3/uL (ref 0.7–4.0)
MCH: 23.7 pg — ABNORMAL LOW (ref 26.0–34.0)
MCHC: 30 g/dL (ref 30.0–36.0)
MCV: 78.9 fL — ABNORMAL LOW (ref 80.0–100.0)
Monocytes Absolute: 0.9 10*3/uL (ref 0.1–1.0)
Monocytes Relative: 8 %
Neutro Abs: 8.7 10*3/uL — ABNORMAL HIGH (ref 1.7–7.7)
Neutrophils Relative %: 79 %
Platelets: 517 10*3/uL — ABNORMAL HIGH (ref 150–400)
RBC: 3.84 MIL/uL — ABNORMAL LOW (ref 3.87–5.11)
RDW: 20.3 % — ABNORMAL HIGH (ref 11.5–15.5)
WBC: 10.9 10*3/uL — ABNORMAL HIGH (ref 4.0–10.5)
nRBC: 0 % (ref 0.0–0.2)

## 2022-04-27 LAB — COMPREHENSIVE METABOLIC PANEL
ALT: 16 U/L (ref 0–44)
AST: 17 U/L (ref 15–41)
Albumin: 2.9 g/dL — ABNORMAL LOW (ref 3.5–5.0)
Alkaline Phosphatase: 93 U/L (ref 38–126)
Anion gap: 7 (ref 5–15)
BUN: 10 mg/dL (ref 8–23)
CO2: 27 mmol/L (ref 22–32)
Calcium: 8.5 mg/dL — ABNORMAL LOW (ref 8.9–10.3)
Chloride: 98 mmol/L (ref 98–111)
Creatinine, Ser: 0.68 mg/dL (ref 0.44–1.00)
GFR, Estimated: >60 mL/min (ref >60)
Glucose, Bld: 87 mg/dL (ref 70–99)
Potassium: 4.1 mmol/L (ref 3.5–5.1)
Sodium: 132 mmol/L — ABNORMAL LOW (ref 135–145)
Total Bilirubin: 0.5 mg/dL (ref 0.3–1.2)
Total Protein: 6.8 g/dL (ref 6.5–8.1)

## 2022-04-27 MED ORDER — MORPHINE SULFATE 15 MG PO TABS: 7.5000 mg | ORAL_TABLET | ORAL | 0 refills | Status: DC | PRN

## 2022-04-27 NOTE — Progress Notes (Signed)
[facility] Follow up Visit:  Patient Care Team: Bonnita Nasuti, MD as PCP - General (Internal Medicine) Barbee Cough, MD as Consulting Physician (Internal Medicine)  CHIEF COMPLAINTS/PURPOSE OF CONSULTATION:  Oncology History  Endometrial cancer, FIGO stage IIIA ([place])  04/06/2022 Initial Diagnosis   Endometrial cancer, FIGO stage IIIA ([place])   04/06/2022 Cancer Staging   Staging form: Corpus Uteri - Carcinoma and Carcinosarcoma, AJCC 8th Edition - Clinical stage from 04/06/2022: FIGO Stage IIIA (cT3a, cN0, cM0) - Signed by Barbee Cough, MD on 04/06/2022 Histopathologic type: Carcinoma, undifferentiated, NOS Stage prefix: Initial diagnosis Histologic grade (G): G2 Histologic grading system: 3 grade system     HISTORY OF PRESENTING ILLNESS: Jolei Schmied 72 y.o. female is here because of  endometrial cancer  Medical history notable for anemia, carotid artery disease, GERD, hearing loss, hyperlipidemia, peripheral vascular disease, sleep apnea (does not use CPAP) CVA, B12 deficiency vitamin D deficiency  April 05 2021:  Presented to [facility] ED with 8 days of vaginal bleeding  Pelvic ultrasound--showed a possibly thickened endometrium at 1.3 cm with an area of heterogeneous echogenicity near the fundus which may represent calcifications..  Anatomy poorly visualized owing to body habitus. CT abdomen pelvis--suspected 3.0 cm diameter submucosal leiomyoma and anterior upper uterus corresponding to the finding question proceeding ultrasound  May 18, 2021: Presented to gynecologic oncology with newly diagnosed grade 2 endometrioid cancer in setting of heavy postmenopausal bleeding since February 2023  May 25, 2021: CT chest for metastasis  June 17, 2021: Underwent total laparoscopic hysterectomy and bilateral salpingo-oophorectomy revealing a FIGO stage IIIa (T3a N0) endometrial cancer.  Immunohistochemistry showed loss of nuclear expression in  MLH1 and PM S2 with [facility] 2 and MSH6 intact  Jun 30 2021:  Given final stage it was recommended to the patient that she receive adjuvant chemotherapy with carboplatin + paclitaxel as well as vaginal cuff brachytherapy. Patient would like to have chemotherapy locally - referral to heme/onc in [place] placed and coordinator and nurse navigator messaged. Treatment can start anytime between 4-6 weeks postop.   April 06 2022:  [place]  Patient declined pursuing any adjuvant therapy because she was concerned that the treatment would make her sicker and because the "surgeon got it all".  She also declined follow up with Medical Oncology or Gynecology.  Craving ice.  Can not tolerate oral iron.   Supposed to see Dr. Lars Mage in March  Social:  Married.  Made medical kits.  Began smoking at 72 years of age and quit age 33.  EtOH none  [place] Mother died 23 lung cancer (smoker) Father died 51 MI Brother died 49 "boone cancer" Brother died 38 cirrhosis (EtOH)   WBC 10.3 hemoglobin 10.7 MCV 78 platelet count 225; 66 segs 23 lymphs 9 monos 1 EO 1 basophil CA125 83.5 Ferritin 8 B12 298 CMP notable for potassium 2.8 albumin 3.4  March 27, 2022: Admitted to [facility]  CT AP-circumferential wall thickening involving mid sigmoid colon with associated hyperemia suspicious for infiltrative mass.  Associated infiltration within the sigmoid mesocolon suggest transmural infiltration.  There is associated pathologic adenopathy within the sigmoid mesentery, left periaortic lymph node group. CT chest without contrast showed bulky mediastinal nodal metastases.  Oblong nodule in right lower lobe.  Increase in size of left lower lobe nodule.    Underwent colonoscopy which revealed partially obstructing sigmoid colon mass involving two thirds of the lumen circumference.  This could not be  traversed with an ultraslim colonoscope. Pathology from biopsy of the sigmoid mass  demonstrated adenocarcinoma which was positive for CK7 and the gynecologic marker PAX8.  Negative for colonic markers cytokeratin 20 and CDX2.  IHC pattern supports metastasis of gynecologic or endometrial origin.  IHC also showed loss of and minor MMR proteins MLH1 and PM S2 further testing for BRAF mutation and/or MLH1 methylation testing was indicated  Surgery was consulted and given that she was not obstructed clinically she was managed conservatively  April 27 2022:  Scheduled follow up for management of Stage IV endometrial cancer.  Has gained 3 lbs.   Taking Oxycodone 5 mg po q 4 hrs PRN.  She is also using ibuprofen 800 mg PRN Will stop Ibuprofen, change Oxycodone to MSIR 7.5 mg po q4 hrs PRN.  Stopping oral iron  Review of Systems  Constitutional:  Negative for appetite change, chills, fatigue, fever and unexpected weight change.       Has gained weight.  Appetite fair  HENT:   Positive for hearing loss. Negative for lump/mass, mouth sores, nosebleeds, sore throat, tinnitus, trouble swallowing and voice change.   Eyes:  Negative for eye problems and icterus.       Vision changes:  None  Respiratory:  Negative for chest tightness, cough, hemoptysis, shortness of breath and wheezing.        PND:  none Orthopnea:  none DOE:    Cardiovascular:  Negative for chest pain, leg swelling and palpitations.       PND:  none Orthopnea:  none  Gastrointestinal:  Positive for constipation, nausea and vomiting. Negative for abdominal distention, abdominal pain, blood in stool and diarrhea.  Endocrine: Negative for hot flashes.       Cold intolerance:  none Heat intolerance:  none  Genitourinary:  Positive for frequency. Negative for difficulty urinating, dysuria, hematuria, nocturia and vaginal bleeding.        Has some stress incontinence and suprapubic pain  Musculoskeletal:  Negative for arthralgias, back pain, gait problem, myalgias, neck pain and neck stiffness.  Skin:  Negative for itching,  rash and wound.  Neurological:  Positive for extremity weakness. Negative for dizziness, gait problem, light-headedness, numbness, seizures and speech difficulty.       Occasional right sided headaches.    Hematological:  Negative for adenopathy. Does not bruise/bleed easily.  Psychiatric/Behavioral:  Negative for sleep disturbance and suicidal ideas. The patient is not nervous/anxious.     MEDICAL HISTORY: Past Medical History:  Diagnosis Date   Anemia    Carotid artery occlusion    GERD (gastroesophageal reflux disease)    Hearing loss    bilateral - no hearing aids   Hyperlipidemia    Intractable nausea and vomiting 04/16/2022   Peripheral vascular disease (HCC)    Sleep apnea    does not use cpap   Stroke ([place])    Vitamin B 12 deficiency    Vitamin D deficiency     SURGICAL HISTORY: Past Surgical History:  Procedure Laterality Date   ABDOMINAL HYSTERECTOMY     BIOPSY  04/18/2022   Procedure: BIOPSY;  Surgeon: Sharyn Creamer, MD;  Location: [facility] ENDOSCOPY;  Service: Gastroenterology;;   COLONOSCOPY     COLONOSCOPY WITH PROPOFOL N/A 04/18/2022   Procedure: COLONOSCOPY WITH PROPOFOL;  Surgeon: Sharyn Creamer, MD;  Location: [facility] ENDOSCOPY;  Service: Gastroenterology;  Laterality: N/A;   IR ANGIO INTRA EXTRACRAN SEL COM CAROTID INNOMINATE UNI R MOD SED  05/11/2020   IR ANGIO  INTRA EXTRACRAN SEL INTERNAL CAROTID UNI L MOD SED  05/11/2020   IR ANGIO INTRA EXTRACRAN SEL INTERNAL CAROTID UNI L MOD SED  06/08/2020   IR ANGIO INTRA EXTRACRAN SEL INTERNAL CAROTID UNI L MOD SED  06/26/2020   IR ANGIO INTRA EXTRACRAN SEL INTERNAL CAROTID UNI L MOD SED  06/09/2021   IR ANGIO VERTEBRAL SEL SUBCLAVIAN INNOMINATE BILAT MOD SED  05/11/2020   IR ANGIOGRAM EXTREMITY BILATERAL  06/08/2020   IR CT HEAD LTD  06/26/2020   IR ILIAC ART STENT INC PTA MOD SED  06/26/2020   IR ILIAC ART STENT INC PTA MOD SED  06/26/2020   IR INTRAVSC STENT CERV CAROTID W/EMB-PROT MOD SED INCL ANGIO  06/26/2020   IR  IVUS EACH ADDITIONAL NON CORONARY VESSEL  06/26/2020   IR RADIOLOGIST EVAL & MGMT  05/28/2020   IR RADIOLOGIST EVAL & MGMT  07/15/2020   IR RADIOLOGIST EVAL & MGMT  07/27/2020   IR US GUIDE VASC ACCESS LEFT  06/26/2020   IR US GUIDE VASC ACCESS RIGHT  05/11/2020   IR US GUIDE VASC ACCESS RIGHT  06/08/2020   IR US GUIDE VASC ACCESS RIGHT  06/26/2020   IR US GUIDE VASC ACCESS RIGHT  06/09/2021   RADIOLOGY WITH ANESTHESIA N/A 05/09/2015   Procedure: RADIOLOGY WITH ANESTHESIA;  Surgeon: Luanne Bras, MD;  Location: [place];  Service: Radiology;  Laterality: N/A;   RADIOLOGY WITH ANESTHESIA N/A 06/08/2020   Procedure: IR WITH ANESTHESIA ANGIOPLASTY;  Surgeon: Luanne Bras, MD;  Location: [place];  Service: Radiology;  Laterality: N/A;   RADIOLOGY WITH ANESTHESIA N/A 06/26/2020   Procedure: REVASCULATION;  Surgeon: Luanne Bras, MD;  Location: [place];  Service: Radiology;  Laterality: N/A;   SUBMUCOSAL TATTOO INJECTION  04/18/2022   Procedure: SUBMUCOSAL TATTOO INJECTION;  Surgeon: Sharyn Creamer, MD;  Location[facility] ENDOSCOPY;  Service: Gastroenterology;;   TUBAL LIGATION      SOCIAL HISTORY: Social History   Socioeconomic History   Marital status: Married    Spouse name: Not on file   Number of children: Not on file   Years of education: Not on file   Highest education level: Not on file  Occupational History   Not on file  Tobacco Use   Smoking status: Every Day    Packs/day: 0.50    Years: 40.00    Total pack years: 20.00    Types: Cigarettes   Smokeless tobacco: Never   Tobacco comments:    1-1 1/2 ppd  Vaping Use   Vaping Use: Never used  Substance and Sexual Activity   Alcohol use: Not Currently    Alcohol/week: 0.0 standard drinks of alcohol   Drug use: Never   Sexual activity: Not on file  Other Topics Concern   Not on file  Social History Narrative   Not on file   Social Determinants of Health   Financial Resource Strain: Not on file  Food  Insecurity: No Food Insecurity (04/17/2022)   Hunger Vital Sign    Worried About Running Out of Food in the Last Year: Never true    Ran Out of Food in the Last Year: Never true  Transportation Needs: No Transportation Needs (04/17/2022)   PRAPARE - Hydrologist (Medical): No    Lack of Transportation (Non-Medical): No  Physical Activity: Not on file  Stress: Not on file  Social Connections: Not on file  Intimate Partner Violence: Not At Risk (04/17/2022)   Humiliation, Afraid,  Rape, and Kick questionnaire    Fear of Current or Ex-Partner: No    Emotionally Abused: No    Physically Abused: No    Sexually Abused: No    FAMILY HISTORY Family History  Problem Relation Age of Onset   Cancer Mother    Hypertension Father    Heart attack Father    Cirrhosis Brother    Cancer Brother        In spine   Heart attack Brother     ALLERGIES:  is allergic to cefadroxil.  MEDICATIONS:  Current Outpatient Medications  Medication Sig Dispense Refill   acetaminophen (TYLENOL) 500 MG tablet Take 500 mg by mouth every 6 (six) hours as needed for moderate pain or headache.     ALPRAZolam (XANAX) 0.25 MG tablet Take 1 tablet (0.25 mg total) by mouth 3 (three) times daily as needed for anxiety. 20 tablet 0   aspirin EC 81 MG tablet Take 81 mg by mouth in the morning. Swallow whole.     atorvastatin (LIPITOR) 40 MG tablet Take 40 mg by mouth in the morning.     Cholecalciferol (DIALYVITE VITAMIN D 5000) 125 MCG (5000 UT) capsule Take 5,000 Units by mouth daily.     clopidogrel (PLAVIX) 75 MG tablet Take 1 tablet (75 mg total) by mouth daily with breakfast. 30 tablet 1   Cyanocobalamin (VITAMIN B-12) 2500 MCG SUBL Take 2,500 mcg by mouth in the morning.     ferrous sulfate 325 (65 FE) MG tablet Take 1 tablet (325 mg total) by mouth daily with breakfast. 30 tablet 0   ibuprofen (ADVIL) 800 MG tablet Take 800 mg by mouth every 8 (eight) hours as needed for moderate pain.      mirtazapine (REMERON) 7.5 MG tablet Take 1 tablet (7.5 mg total) by mouth at bedtime. 30 tablet 0   oxyCODONE (OXY IR/ROXICODONE) 5 MG immediate release tablet Take 1 tablet (5 mg total) by mouth every 4 (four) hours as needed for moderate pain, severe pain or breakthrough pain. 20 tablet 0   pantoprazole (PROTONIX) 40 MG tablet Take 1 tablet (40 mg total) by mouth 2 (two) times daily. (Patient taking differently: Take 40 mg by mouth in the morning.) 60 tablet 2   potassium chloride SA (KLOR-CON M) 20 MEQ tablet Take 1 tablet (20 mEq total) by mouth 2 (two) times daily for 14 days. 28 tablet 0   senna-docusate (SENOKOT-S) 8.6-50 MG tablet Take 2 tablets by mouth 2 (two) times daily. 120 tablet 0   No current facility-administered medications for this visit.    PHYSICAL EXAMINATION:  ECOG PERFORMANCE STATUS: 2 - Symptomatic, <50% confined to bed   There were no vitals filed for this visit.   There were no vitals filed for this visit.    Physical Exam Vitals and nursing note reviewed.  Constitutional:      Appearance: Normal appearance. She is not toxic-appearing or diaphoretic.     Comments: Here with husband  HENT:     Head: Normocephalic and atraumatic.     Right Ear: External ear normal.     Left Ear: External ear normal.     Nose: Nose normal. No congestion or rhinorrhea.  Eyes:     General: No scleral icterus.    Extraocular Movements: Extraocular movements intact.     Conjunctiva/sclera: Conjunctivae normal.     Pupils: Pupils are equal, round, and reactive to light.  Cardiovascular:     Rate and Rhythm: Normal rate.  Heart sounds: No murmur heard.    No friction rub. No gallop.  Pulmonary:     Effort: Pulmonary effort is normal. No respiratory distress.     Breath sounds: Normal breath sounds. No stridor. No wheezing or rhonchi.  Abdominal:     General: Bowel sounds are normal.     Palpations: Abdomen is soft.  Musculoskeletal:        General: No swelling,  tenderness or deformity.     Cervical back: Normal range of motion and neck supple. No rigidity or tenderness.  Lymphadenopathy:     Head:     Right side of head: No submental, submandibular, tonsillar, preauricular, posterior auricular or occipital adenopathy.     Left side of head: No submental, submandibular, tonsillar, preauricular, posterior auricular or occipital adenopathy.     Cervical: No cervical adenopathy.     Right cervical: No superficial, deep or posterior cervical adenopathy.    Left cervical: No superficial, deep or posterior cervical adenopathy.     Upper Body:     Right upper body: No supraclavicular, axillary, pectoral or epitrochlear adenopathy.     Left upper body: No supraclavicular, axillary, pectoral or epitrochlear adenopathy.  Skin:    General: Skin is warm.     Coloration: Skin is pale. Skin is not jaundiced.     Findings: No erythema.  Neurological:     General: No focal deficit present.     Mental Status: She is alert and oriented to person, place, and time.     Comments: Hard of hearing  Psychiatric:        Mood and Affect: Mood normal.        Behavior: Behavior normal.        Thought Content: Thought content normal.        Judgment: Judgment normal.     LABORATORY DATA: I have personally reviewed the data as listed:  No results displayed because visit has over 200 results.    Appointment on 04/06/2022  Component Date Value Ref Range Status   Cancer Antigen (CA) 125 04/06/2022 83.5 (H)  0.0 - 38.1 U/mL Final   Comment: (NOTE) Roche Diagnostics Electrochemiluminescence Immunoassay (ECLIA) Values obtained with different assay methods or kits cannot be used interchangeably.  Results cannot be interpreted as absolute evidence of the presence or absence of malignant disease. Performed At: [facility] [place], [place] [id number] Rush Farmer MD [id number]    Vitamin B-12 04/06/2022 298  180 - 914 pg/mL Final    Comment: (NOTE) This assay is not validated for testing neonatal or myeloproliferative syndrome specimens for Vitamin B12 levels. Performed at [facility], [place] [address], [place], [place] [address]    Ferritin 04/06/2022 8 (L)  11 - 307 ng/mL Final   Performed at [facility] [address], [place], [place] [address]  Office Visit on 04/06/2022  Component Date Value Ref Range Status   WBC 04/06/2022 10.3  4.0 - 10.5 K/uL Final   RBC 04/06/2022 4.64  3.87 - 5.11 MIL/uL Final   Hemoglobin 04/06/2022 10.7 (L)  12.0 - 15.0 g/dL Final   HCT 04/06/2022 36.0  36.0 - 46.0 % Final   MCV 04/06/2022 77.6 (L)  80.0 - 100.0 fL Final   MCH 04/06/2022 23.1 (L)  26.0 - 34.0 pg Final   MCHC 04/06/2022 29.7 (L)  30.0 - 36.0 g/dL Final   RDW 04/06/2022 20.9 (H)  11.5 - 15.5 % Final   Platelets  04/06/2022 225  150 - 400 K/uL Final   Comment: SPECIMEN CHECKED FOR CLOTS REPEATED TO VERIFY PLATELET COUNT CONFIRMED BY SMEAR    nRBC 04/06/2022 0.0  0.0 - 0.2 % Final   Neutrophils Relative % 04/06/2022 66  % Final   Neutro Abs 04/06/2022 6.7  1.7 - 7.7 K/uL Final   Lymphocytes Relative 04/06/2022 23  % Final   Lymphs Abs 04/06/2022 2.4  0.7 - 4.0 K/uL Final   Monocytes Relative 04/06/2022 9  % Final   Monocytes Absolute 04/06/2022 1.0  0.1 - 1.0 K/uL Final   Eosinophils Relative 04/06/2022 1  % Final   Eosinophils Absolute 04/06/2022 0.1  0.0 - 0.5 K/uL Final   Basophils Relative 04/06/2022 1  % Final   Basophils Absolute 04/06/2022 0.1  0.0 - 0.1 K/uL Final   Immature Granulocytes 04/06/2022 0  % Final   Abs Immature Granulocytes 04/06/2022 0.02  0.00 - 0.07 K/uL Final   Performed at [facility], [place] [address] [place], [place] [address]   Sodium 04/06/2022 138  135 - 145 mmol/L Final   Potassium 04/06/2022 2.8 (L)  3.5 - 5.1 mmol/L Final   Chloride 04/06/2022 96 (L)  98 - 111 mmol/L Final   CO2 04/06/2022 27  22 - 32 mmol/L Final    Glucose, Bld 04/06/2022 79  70 - 99 mg/dL Final   Glucose reference range applies only to samples taken after fasting for at least 8 hours.   BUN 04/06/2022 9  8 - 23 mg/dL Final   Creatinine, Ser 04/06/2022 0.96  0.44 - 1.00 mg/dL Final   Calcium 04/06/2022 8.8 (L)  8.9 - 10.3 mg/dL Final   Total Protein 04/06/2022 6.7  6.5 - 8.1 g/dL Final   Albumin 04/06/2022 3.4 (L)  3.5 - 5.0 g/dL Final   AST 04/06/2022 15  15 - 41 U/L Final   ALT 04/06/2022 8  0 - 44 U/L Final   Alkaline Phosphatase 04/06/2022 100  38 - 126 U/L Final   Total Bilirubin 04/06/2022 0.6  0.3 - 1.2 mg/dL Final   GFR, Estimated 04/06/2022 >60  >60 mL/min Final   Comment: (NOTE) Calculated using the CKD-EPI Creatinine Equation (2021)    Anion gap 04/06/2022 15  5 - 15 Final   Performed at [facility], [place] [address], [place], [place] [address]    RADIOGRAPHIC STUDIES: I have personally reviewed the radiological images as listed and agree with the findings in the report  No results found.  ASSESSMENT/PLAN   72 y.o. female with medical history notable for anemia, carotid artery disease, GERD, hearing loss, hyperlipidemia, peripheral vascular disease, sleep apnea (does not use CPAP) CVA, B12 deficiency vitamin D deficiency.  Patient has history of endometrial carcinoma treated with resection in April 2023  Endometrial carcinoma, FIGO  Stage IIIA (T3a N0 M0), Grade 2, MSI-H:   June 17 2021:  Total laparoscopic hysterectomy and bilateral salpingo-oophorectomy performed Adjuvant chemotherapy and XRT recommended which patient declined .   Patient also declined regular follow up March 27 2022:  CT CAP bulky mediastinal nodal mets, wall thickening in sigmoid colon and mesentery, pathologic intra-abdominal adenopathy.  Bx consistent with endometrial cancer  Therapeutics April 27 2022: In light of patient having Stage IV disease goal of care is palliative.  Options are chemotherapy vs hospice.  Patient is  at high risk for complication owing to her comorbidities and decreased performance status.  Patient and husband would like to proceed with  a trial of chemotherapy which would consist of Taxol/Carbo/ Dostarlimab-gxly   MSI High status:  Suggests that patient should be evaluated for Lynch syndrome.  Should she develop recurrent endometrial cancer would potentially benefit from immunotherapy  Anemia:  Secondary to iron deficiency from GI blood loss due to involvement of colon from metastatic endometrial cancer  April 06 2022:  IV iron written for but not given due to intercurrent illness  May 02 2022:  To receive IV iron  Poor venous access:  In preparation for chemotherapy we will arrange for port-a-cath placement.  We discussed benefits (more efficient access to administer chemotherapy and supportive care medications/IVF, imaging contrast, decreased risk of tissue damage from medications) and disadvantages (risks of infections, thrombosis, surgical procedure, periodic flushing).  Patient has elected to proceed with port placement.       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   No orders of the defined types were placed in this encounter.  109  minutes was spent in patient care.  This included time spent preparing to see the patient (e.g., review of tests), obtaining and/or reviewing separately obtained history, counseling and educating the patient/family/caregiver, ordering  tests; medications, procedures, documenting clinical information in the electronic or other health record, independently interpreting results and communicating results to the patient/family/caregiver as well as coordination of care.      All questions were answered. The patient knows to call the clinic with any problems, questions or concerns.  This note was electronically signed.    Barbee Cough, MD  04/27/2022 10:41 AM

## 2022-04-27 NOTE — Progress Notes (Signed)
START ON PATHWAY REGIMEN - Uterine     Cycles 1 through 6: A cycle is every 21 days:     Dostarlimab-gxly      Paclitaxel      Carboplatin    Cycles 7 and beyond: A cycle is every 42 days:     Dostarlimab-gxly   **Always confirm dose/schedule in your pharmacy ordering system**  Patient Characteristics: Endometrioid, Recurrent/Progressive Disease, No Prior Systemic Therapy - Distant Recurrence, MSI-H/dMMR Histology: Endometrioid Therapeutic Status: Recurrent or Progressive Disease Microsatellite/Mismatch Repair Status: MSI-H/dMMR Line of Therapy: No Prior Systemic Therapy - Distant Recurrence  Intent of Therapy: Non-Curative / Palliative Intent, Discussed with Patient

## 2022-04-29 ENCOUNTER — Ambulatory Visit: Payer: Medicare HMO

## 2022-04-29 ENCOUNTER — Encounter: Payer: Self-pay | Admitting: Oncology

## 2022-04-29 MED FILL — Iron Sucrose Inj 20 MG/ML (Fe Equiv): INTRAVENOUS | Qty: 10 | Status: AC

## 2022-04-29 NOTE — Addendum Note (Signed)
Addended by: Juanetta Beets on: 04/29/2022 10:39 AM   Modules accepted: Orders

## 2022-05-02 ENCOUNTER — Inpatient Hospital Stay: Payer: Medicare HMO

## 2022-05-03 ENCOUNTER — Encounter: Payer: Self-pay | Admitting: Oncology

## 2022-05-03 MED FILL — Iron Sucrose Inj 20 MG/ML (Fe Equiv): INTRAVENOUS | Qty: 10 | Status: AC

## 2022-05-03 NOTE — Addendum Note (Signed)
Addended by: Juanetta Beets on: 05/03/2022 12:37 PM   Modules accepted: Orders

## 2022-05-04 ENCOUNTER — Encounter (HOSPITAL_COMMUNITY): Payer: Self-pay

## 2022-05-04 ENCOUNTER — Inpatient Hospital Stay: Payer: Medicare HMO

## 2022-05-04 ENCOUNTER — Emergency Department (HOSPITAL_COMMUNITY): Payer: Medicare HMO

## 2022-05-04 ENCOUNTER — Other Ambulatory Visit: Payer: Self-pay

## 2022-05-04 ENCOUNTER — Other Ambulatory Visit: Payer: Medicare HMO

## 2022-05-04 ENCOUNTER — Inpatient Hospital Stay (HOSPITAL_COMMUNITY)
Admission: EM | Admit: 2022-05-04 | Discharge: 2022-05-06 | DRG: 065 | Disposition: A | Discharge status: Hospice | Payer: Medicare HMO | Attending: Internal Medicine | Admitting: Internal Medicine

## 2022-05-04 ENCOUNTER — Inpatient Hospital Stay: Payer: Medicare HMO | Admitting: Oncology

## 2022-05-04 DIAGNOSIS — E559 Vitamin D deficiency, unspecified: Secondary | ICD-10-CM | POA: Diagnosis present

## 2022-05-04 DIAGNOSIS — Z8543 Personal history of malignant neoplasm of ovary: Secondary | ICD-10-CM

## 2022-05-04 DIAGNOSIS — I69391 Dysphagia following cerebral infarction: Secondary | ICD-10-CM | POA: Diagnosis not present

## 2022-05-04 DIAGNOSIS — I5032 Chronic diastolic (congestive) heart failure: Secondary | ICD-10-CM | POA: Diagnosis present

## 2022-05-04 DIAGNOSIS — Z6831 Body mass index (BMI) 31.0-31.9, adult: Secondary | ICD-10-CM | POA: Diagnosis not present

## 2022-05-04 DIAGNOSIS — C541 Malignant neoplasm of endometrium: Secondary | ICD-10-CM | POA: Diagnosis present

## 2022-05-04 DIAGNOSIS — Z515 Encounter for palliative care: Secondary | ICD-10-CM | POA: Diagnosis not present

## 2022-05-04 DIAGNOSIS — I63322 Cerebral infarction due to thrombosis of left anterior cerebral artery: Secondary | ICD-10-CM | POA: Diagnosis not present

## 2022-05-04 DIAGNOSIS — Z881 Allergy status to other antibiotic agents status: Secondary | ICD-10-CM

## 2022-05-04 DIAGNOSIS — E785 Hyperlipidemia, unspecified: Secondary | ICD-10-CM | POA: Diagnosis present

## 2022-05-04 DIAGNOSIS — K219 Gastro-esophageal reflux disease without esophagitis: Secondary | ICD-10-CM | POA: Diagnosis present

## 2022-05-04 DIAGNOSIS — K7682 Hepatic encephalopathy: Secondary | ICD-10-CM | POA: Diagnosis present

## 2022-05-04 DIAGNOSIS — C787 Secondary malignant neoplasm of liver and intrahepatic bile duct: Secondary | ICD-10-CM | POA: Diagnosis present

## 2022-05-04 DIAGNOSIS — H5462 Unqualified visual loss, left eye, normal vision right eye: Secondary | ICD-10-CM

## 2022-05-04 DIAGNOSIS — G4733 Obstructive sleep apnea (adult) (pediatric): Secondary | ICD-10-CM | POA: Diagnosis present

## 2022-05-04 DIAGNOSIS — E669 Obesity, unspecified: Secondary | ICD-10-CM | POA: Diagnosis present

## 2022-05-04 DIAGNOSIS — Z9851 Tubal ligation status: Secondary | ICD-10-CM

## 2022-05-04 DIAGNOSIS — R4182 Altered mental status, unspecified: Principal | ICD-10-CM

## 2022-05-04 DIAGNOSIS — E875 Hyperkalemia: Secondary | ICD-10-CM | POA: Diagnosis present

## 2022-05-04 DIAGNOSIS — I110 Hypertensive heart disease with heart failure: Secondary | ICD-10-CM | POA: Diagnosis present

## 2022-05-04 DIAGNOSIS — Z79899 Other long term (current) drug therapy: Secondary | ICD-10-CM

## 2022-05-04 DIAGNOSIS — F17210 Nicotine dependence, cigarettes, uncomplicated: Secondary | ICD-10-CM | POA: Diagnosis present

## 2022-05-04 DIAGNOSIS — G8929 Other chronic pain: Secondary | ICD-10-CM | POA: Diagnosis present

## 2022-05-04 DIAGNOSIS — E7220 Disorder of urea cycle metabolism, unspecified: Secondary | ICD-10-CM

## 2022-05-04 DIAGNOSIS — I639 Cerebral infarction, unspecified: Secondary | ICD-10-CM | POA: Diagnosis present

## 2022-05-04 DIAGNOSIS — I739 Peripheral vascular disease, unspecified: Secondary | ICD-10-CM | POA: Diagnosis present

## 2022-05-04 DIAGNOSIS — Z720 Tobacco use: Secondary | ICD-10-CM | POA: Diagnosis present

## 2022-05-04 DIAGNOSIS — I69320 Aphasia following cerebral infarction: Secondary | ICD-10-CM

## 2022-05-04 DIAGNOSIS — D509 Iron deficiency anemia, unspecified: Secondary | ICD-10-CM | POA: Diagnosis present

## 2022-05-04 DIAGNOSIS — R29818 Other symptoms and signs involving the nervous system: Secondary | ICD-10-CM

## 2022-05-04 DIAGNOSIS — I63329 Cerebral infarction due to thrombosis of unspecified anterior cerebral artery: Secondary | ICD-10-CM | POA: Diagnosis present

## 2022-05-04 DIAGNOSIS — N179 Acute kidney failure, unspecified: Secondary | ICD-10-CM | POA: Diagnosis present

## 2022-05-04 DIAGNOSIS — H9193 Unspecified hearing loss, bilateral: Secondary | ICD-10-CM | POA: Diagnosis present

## 2022-05-04 DIAGNOSIS — Z66 Do not resuscitate: Secondary | ICD-10-CM | POA: Diagnosis present

## 2022-05-04 DIAGNOSIS — I6389 Other cerebral infarction: Secondary | ICD-10-CM | POA: Diagnosis not present

## 2022-05-04 DIAGNOSIS — Z7902 Long term (current) use of antithrombotics/antiplatelets: Secondary | ICD-10-CM

## 2022-05-04 DIAGNOSIS — Z8249 Family history of ischemic heart disease and other diseases of the circulatory system: Secondary | ICD-10-CM

## 2022-05-04 DIAGNOSIS — I63442 Cerebral infarction due to embolism of left cerebellar artery: Principal | ICD-10-CM | POA: Diagnosis present

## 2022-05-04 DIAGNOSIS — Z90710 Acquired absence of both cervix and uterus: Secondary | ICD-10-CM

## 2022-05-04 DIAGNOSIS — C569 Malignant neoplasm of unspecified ovary: Secondary | ICD-10-CM | POA: Diagnosis not present

## 2022-05-04 DIAGNOSIS — E871 Hypo-osmolality and hyponatremia: Secondary | ICD-10-CM | POA: Diagnosis present

## 2022-05-04 DIAGNOSIS — E538 Deficiency of other specified B group vitamins: Secondary | ICD-10-CM | POA: Diagnosis present

## 2022-05-04 DIAGNOSIS — R29702 NIHSS score 2: Secondary | ICD-10-CM | POA: Diagnosis present

## 2022-05-04 DIAGNOSIS — C799 Secondary malignant neoplasm of unspecified site: Secondary | ICD-10-CM | POA: Diagnosis not present

## 2022-05-04 DIAGNOSIS — R7401 Elevation of levels of liver transaminase levels: Secondary | ICD-10-CM

## 2022-05-04 DIAGNOSIS — E039 Hypothyroidism, unspecified: Secondary | ICD-10-CM | POA: Diagnosis present

## 2022-05-04 DIAGNOSIS — Z7982 Long term (current) use of aspirin: Secondary | ICD-10-CM

## 2022-05-04 DIAGNOSIS — I10 Essential (primary) hypertension: Secondary | ICD-10-CM | POA: Diagnosis present

## 2022-05-04 LAB — DIFFERENTIAL
Abs Immature Granulocytes: 0.13 10*3/uL — ABNORMAL HIGH (ref 0.00–0.07)
Basophils Absolute: 0 10*3/uL (ref 0.0–0.1)
Basophils Relative: 0 %
Eosinophils Absolute: 0 10*3/uL (ref 0.0–0.5)
Eosinophils Relative: 0 %
Immature Granulocytes: 1 %
Lymphocytes Relative: 5 %
Lymphs Abs: 0.8 10*3/uL (ref 0.7–4.0)
Monocytes Absolute: 1.5 10*3/uL — ABNORMAL HIGH (ref 0.1–1.0)
Monocytes Relative: 9 %
Neutro Abs: 15.3 10*3/uL — ABNORMAL HIGH (ref 1.7–7.7)
Neutrophils Relative %: 85 %

## 2022-05-04 LAB — COMPREHENSIVE METABOLIC PANEL
ALT: 833 U/L — ABNORMAL HIGH (ref 0–44)
AST: 198 U/L — ABNORMAL HIGH (ref 15–41)
Albumin: 3.1 g/dL — ABNORMAL LOW (ref 3.5–5.0)
Alkaline Phosphatase: 105 U/L (ref 38–126)
Anion gap: 13 (ref 5–15)
BUN: 22 mg/dL (ref 8–23)
CO2: 28 mmol/L (ref 22–32)
Calcium: 8.8 mg/dL — ABNORMAL LOW (ref 8.9–10.3)
Chloride: 91 mmol/L — ABNORMAL LOW (ref 98–111)
Creatinine, Ser: 1.63 mg/dL — ABNORMAL HIGH (ref 0.44–1.00)
GFR, Estimated: 34 mL/min — ABNORMAL LOW (ref >60)
Glucose, Bld: 116 mg/dL — ABNORMAL HIGH (ref 70–99)
Potassium: 5 mmol/L (ref 3.5–5.1)
Sodium: 132 mmol/L — ABNORMAL LOW (ref 135–145)
Total Bilirubin: 0.8 mg/dL (ref 0.3–1.2)
Total Protein: 6.7 g/dL (ref 6.5–8.1)

## 2022-05-04 LAB — I-STAT CHEM 8, ED
BUN: 29 mg/dL — ABNORMAL HIGH (ref 8–23)
Calcium, Ion: 1.03 mmol/L — ABNORMAL LOW (ref 1.15–1.40)
Chloride: 94 mmol/L — ABNORMAL LOW (ref 98–111)
Creatinine, Ser: 1.7 mg/dL — ABNORMAL HIGH (ref 0.44–1.00)
Glucose, Bld: 109 mg/dL — ABNORMAL HIGH (ref 70–99)
HCT: 32 % — ABNORMAL LOW (ref 36.0–46.0)
Hemoglobin: 10.9 g/dL — ABNORMAL LOW (ref 12.0–15.0)
Potassium: 8 mmol/L (ref 3.5–5.1)
Sodium: 129 mmol/L — ABNORMAL LOW (ref 135–145)
TCO2: 34 mmol/L — ABNORMAL HIGH (ref 22–32)

## 2022-05-04 LAB — URINALYSIS, ROUTINE W REFLEX MICROSCOPIC
Bacteria, UA: NONE SEEN
Bilirubin Urine: NEGATIVE
Glucose, UA: NEGATIVE mg/dL
Hgb urine dipstick: NEGATIVE
Ketones, ur: NEGATIVE mg/dL
Leukocytes,Ua: NEGATIVE
Nitrite: NEGATIVE
Protein, ur: 30 mg/dL — AB
Specific Gravity, Urine: >1.046 — ABNORMAL HIGH (ref 1.005–1.030)
pH: 5 (ref 5.0–8.0)

## 2022-05-04 LAB — PROTIME-INR
INR: 1.3 — ABNORMAL HIGH (ref 0.8–1.2)
Prothrombin Time: 16.1 seconds — ABNORMAL HIGH (ref 11.4–15.2)

## 2022-05-04 LAB — RAPID URINE DRUG SCREEN, HOSP PERFORMED
Amphetamines: NOT DETECTED
Barbiturates: NOT DETECTED
Benzodiazepines: NOT DETECTED
Cocaine: NOT DETECTED
Opiates: POSITIVE — AB
Tetrahydrocannabinol: NOT DETECTED

## 2022-05-04 LAB — CBC
HCT: 32.2 % — ABNORMAL LOW (ref 36.0–46.0)
HCT: 30.5 % — ABNORMAL LOW (ref 36.0–46.0)
Hemoglobin: 9.7 g/dL — ABNORMAL LOW (ref 12.0–15.0)
Hemoglobin: 9 g/dL — ABNORMAL LOW (ref 12.0–15.0)
MCH: 23.4 pg — ABNORMAL LOW (ref 26.0–34.0)
MCH: 23.6 pg — ABNORMAL LOW (ref 26.0–34.0)
MCHC: 30.1 g/dL (ref 30.0–36.0)
MCHC: 29.5 g/dL — ABNORMAL LOW (ref 30.0–36.0)
MCV: 77.8 fL — ABNORMAL LOW (ref 80.0–100.0)
MCV: 79.8 fL — ABNORMAL LOW (ref 80.0–100.0)
Platelets: 433 10*3/uL — ABNORMAL HIGH (ref 150–400)
Platelets: 378 10*3/uL (ref 150–400)
RBC: 4.14 MIL/uL (ref 3.87–5.11)
RBC: 3.82 MIL/uL — ABNORMAL LOW (ref 3.87–5.11)
RDW: 19.6 % — ABNORMAL HIGH (ref 11.5–15.5)
RDW: 19.8 % — ABNORMAL HIGH (ref 11.5–15.5)
WBC: 17.8 10*3/uL — ABNORMAL HIGH (ref 4.0–10.5)
WBC: 15.3 10*3/uL — ABNORMAL HIGH (ref 4.0–10.5)
nRBC: 0 % (ref 0.0–0.2)
nRBC: 0.1 % (ref 0.0–0.2)

## 2022-05-04 LAB — BLOOD GAS, VENOUS
Acid-Base Excess: 6.7 mmol/L — ABNORMAL HIGH (ref 0.0–2.0)
Bicarbonate: 33.3 mmol/L — ABNORMAL HIGH (ref 20.0–28.0)
O2 Saturation: 64.8 %
Patient temperature: 37
pCO2, Ven: 55 mmHg (ref 44–60)
pH, Ven: 7.39 (ref 7.25–7.43)
pO2, Ven: 38 mmHg (ref 32–45)

## 2022-05-04 LAB — CBG MONITORING, ED: Glucose-Capillary: 132 mg/dL — ABNORMAL HIGH (ref 70–99)

## 2022-05-04 LAB — AMMONIA: Ammonia: 110 umol/L — ABNORMAL HIGH (ref 9–35)

## 2022-05-04 LAB — APTT: aPTT: 33 seconds (ref 24–36)

## 2022-05-04 LAB — ETHANOL: Alcohol, Ethyl (B): <10 mg/dL (ref <10)

## 2022-05-04 LAB — POTASSIUM: Potassium: 4.8 mmol/L (ref 3.5–5.1)

## 2022-05-04 MED ORDER — IOHEXOL 300 MG/ML  SOLN
80.0000 mL | Freq: Once | INTRAMUSCULAR | Status: AC | PRN
  Administered 2022-05-04: 80 mL via INTRAVENOUS

## 2022-05-04 MED ORDER — STROKE: EARLY STAGES OF RECOVERY BOOK: Freq: Once | Status: DC

## 2022-05-04 MED ORDER — ASPIRIN 81 MG PO TBEC
81.0000 mg | DELAYED_RELEASE_TABLET | Freq: Every day | ORAL | Status: DC
  Administered 2022-05-05 – 2022-05-06 (×2): 81 mg via ORAL
  Filled 2022-05-04 (×2): qty 1

## 2022-05-04 MED ORDER — CLOPIDOGREL BISULFATE 75 MG PO TABS
75.0000 mg | ORAL_TABLET | Freq: Every day | ORAL | Status: DC
  Administered 2022-05-04 – 2022-05-06 (×3): 75 mg via ORAL
  Filled 2022-05-04 (×3): qty 1

## 2022-05-04 MED ORDER — CALCIUM GLUCONATE-NACL 1-0.675 GM/50ML-% IV SOLN
1.0000 g | Freq: Once | INTRAVENOUS | Status: AC
  Administered 2022-05-04: 1000 mg via INTRAVENOUS
  Filled 2022-05-04: qty 50

## 2022-05-04 MED ORDER — ACETAMINOPHEN 325 MG PO TABS: 650.0000 mg | ORAL_TABLET | Freq: Four times a day (QID) | ORAL | Status: DC | PRN

## 2022-05-04 MED ORDER — SODIUM CHLORIDE 0.9 % IV BOLUS
1000.0000 mL | Freq: Once | INTRAVENOUS | Status: AC
  Administered 2022-05-04: 1000 mL via INTRAVENOUS

## 2022-05-04 MED ORDER — LACTULOSE 10 GM/15ML PO SOLN
10.0000 g | Freq: Three times a day (TID) | ORAL | Status: DC
  Administered 2022-05-04 – 2022-05-06 (×7): 10 g via ORAL
  Filled 2022-05-04 (×7): qty 30

## 2022-05-04 MED ORDER — SODIUM CHLORIDE 0.9 % IV SOLN: INTRAVENOUS | Status: DC

## 2022-05-04 MED ORDER — SODIUM CHLORIDE (PF) 0.9 % IJ SOLN
INTRAMUSCULAR | Status: AC
  Filled 2022-05-04: qty 50

## 2022-05-04 MED ORDER — PANTOPRAZOLE SODIUM 40 MG PO TBEC
40.0000 mg | DELAYED_RELEASE_TABLET | Freq: Every morning | ORAL | Status: DC
  Administered 2022-05-05 – 2022-05-06 (×2): 40 mg via ORAL
  Filled 2022-05-04 (×2): qty 1

## 2022-05-04 MED ORDER — ACETAMINOPHEN 650 MG RE SUPP: 650.0000 mg | Freq: Four times a day (QID) | RECTAL | Status: DC | PRN

## 2022-05-04 MED ORDER — GADOBUTROL 1 MMOL/ML IV SOLN
8.0000 mL | Freq: Once | INTRAVENOUS | Status: AC | PRN
  Administered 2022-05-04: 8 mL via INTRAVENOUS

## 2022-05-04 MED ORDER — DOCUSATE SODIUM 100 MG PO CAPS
100.0000 mg | ORAL_CAPSULE | Freq: Two times a day (BID) | ORAL | Status: DC
  Administered 2022-05-04 – 2022-05-06 (×5): 100 mg via ORAL
  Filled 2022-05-04 (×5): qty 1

## 2022-05-04 MED ORDER — ATORVASTATIN CALCIUM 40 MG PO TABS
40.0000 mg | ORAL_TABLET | Freq: Every morning | ORAL | Status: DC
  Administered 2022-05-05 – 2022-05-06 (×2): 40 mg via ORAL
  Filled 2022-05-04 (×2): qty 1

## 2022-05-04 MED ORDER — HEPARIN SODIUM (PORCINE) 5000 UNIT/ML IJ SOLN
5000.0000 [IU] | Freq: Three times a day (TID) | INTRAMUSCULAR | Status: DC
  Administered 2022-05-04 – 2022-05-05 (×3): 5000 [IU] via SUBCUTANEOUS
  Filled 2022-05-04 (×3): qty 1

## 2022-05-04 MED ORDER — MIRTAZAPINE 15 MG PO TABS
7.5000 mg | ORAL_TABLET | Freq: Every day | ORAL | Status: DC
  Administered 2022-05-04 – 2022-05-05 (×2): 7.5 mg via ORAL
  Filled 2022-05-04 (×2): qty 1

## 2022-05-04 NOTE — ED Provider Notes (Signed)
[place] EMERGENCY DEPARTMENT AT [facility] Provider Note   CSN: [id number] Arrival date & time: 05/04/22  1017     History  CC: Left vision loss   Monica Pugh is a 72 y.o. female presented to ED with slow speech, confusion, and vision loss in her left eye.  Patient presents by EMS.  Her husband called an ambulance as the patient was "groggy and slow to respond this morning".  The patient tells me that she cannot see out of her left eye.  She said that she woke up feeling fine and lost her vision before the ambulance arrived.  EMS reports they arrived around 9:30 AM at the house.  The patient denies headache.  She is slow to speak and appears confused.  Medical chart review shows the patient was discharged from the hospital March 4, 9 days ago, and has a history of endometrial cancer, carotid artery occlusion, coronary disease, CVA, OSA, PVD, reflux, bilateral hearing loss.  Family had been in the process of considering transition to home hospice but is still in discussion with oncology about treatment options.  Her husband Monica Pugh reports he woke up at [facility] and found the patient lying in bed starring in space, not speaking to her.  She wouldn't answer questions. She was scheduled for an iron infusion today and to see an oncologist in [facility].  Her husband reports that he and the patient's children all believe that she would be better suited for home hospice care at this point, citing a decline in her living status and her chronic pain.  However, her husband reports they were told by the oncologist at [facility] that she would be a candidate to pursue aggressive chemotherapy, so the patient had opted for treatment earlier.  They are supposed to have a port placed to start chemo.  HPI     Home Medications Prior to Admission medications   Medication Sig Start Date End Date Taking? Authorizing Provider  aspirin EC 81 MG tablet Take 81 mg by mouth in the morning. Swallow whole.     [provider]  atorvastatin (LIPITOR) 40 MG tablet Take 40 mg by mouth in the morning.    [provider]  Cholecalciferol (DIALYVITE VITAMIN D 5000) 125 MCG (5000 UT) capsule Take 5,000 Units by mouth daily.    [provider]  clopidogrel (PLAVIX) 75 MG tablet Take 1 tablet (75 mg total) by mouth daily with breakfast. 05/21/15   Angiulli, Lavon Paganini, PA-C  Cyanocobalamin (VITAMIN B-12) 2500 MCG SUBL Take 2,500 mcg by mouth in the morning.    [provider]  mirtazapine (REMERON) 7.5 MG tablet Take 1 tablet (7.5 mg total) by mouth at bedtime. 04/25/22 05/25/22  Manuella Ghazi, Pratik D, DO  morphine (MSIR) 15 MG tablet Take 0.5 tablets (7.5 mg total) by mouth every 4 (four) hours as needed for severe pain. 04/27/22   Barbee Cough, MD  pantoprazole (PROTONIX) 40 MG tablet Take 1 tablet (40 mg total) by mouth 2 (two) times daily. Patient taking differently: Take 40 mg by mouth in the morning. 05/21/15   Angiulli, Lavon Paganini, PA-C  potassium chloride SA (KLOR-CON M) 20 MEQ tablet Take 1 tablet (20 mEq total) by mouth 2 (two) times daily for 14 days. 04/12/22 04/26/22  Barbee Cough, MD  senna-docusate (SENOKOT-S) 8.6-50 MG tablet Take 2 tablets by mouth 2 (two) times daily. 04/25/22 05/25/22  Heath Lark D, DO      Allergies  Cefadroxil    Review of Systems   Review of Systems  Physical Exam Updated Vital Signs BP (!) 139/51   Pulse 76   Temp 97.6 F (36.4 C) (Oral)   Resp 14   Ht 5' 3.5" (1.613 m)   Wt 80.7 kg   LMP  (LMP Unknown)   SpO2 99%   BMI 31.04 kg/m  Physical Exam Constitutional:      General: She is not in acute distress.    Comments: Slow to speak, does not make eye contact  HENT:     Head: Normocephalic and atraumatic.  Eyes:     Conjunctiva/sclera: Conjunctivae normal.     Pupils: Pupils are equal, round, and reactive to light.  Cardiovascular:     Rate and Rhythm: Normal rate and regular rhythm.  Pulmonary:     Effort: Pulmonary  effort is normal. No respiratory distress.  Abdominal:     General: There is no distension.     Tenderness: There is no abdominal tenderness.  Musculoskeletal:     Comments: Edema of right arm  Skin:    General: Skin is warm and dry.  Neurological:     Mental Status: She is alert.     Comments: Sensation is grossly intact in all dermatomes.  Patient has equal strength in bilateral upper and lower extremities.  She is able to ambulate on scene with EMS. Patient reports loss of vision in her left eye.  She cannot tell me whether she can even see my fingers.  She appears of difficulty with leftward gaze, but does not have a gaze deviation at baseline.  No hemineglect on exam.     ED Results / Procedures / Treatments   Labs (all labs ordered are listed, but only abnormal results are displayed) Labs Reviewed  PROTIME-INR - Abnormal; Notable for the following components:      Result Value   Prothrombin Time 16.1 (*)    INR 1.3 (*)    All other components within normal limits  CBC - Abnormal; Notable for the following components:   WBC 17.8 (*)    Hemoglobin 9.7 (*)    HCT 32.2 (*)    MCV 77.8 (*)    MCH 23.4 (*)    RDW 19.6 (*)    Platelets 433 (*)    All other components within normal limits  DIFFERENTIAL - Abnormal; Notable for the following components:   Neutro Abs 15.3 (*)    Monocytes Absolute 1.5 (*)    Abs Immature Granulocytes 0.13 (*)    All other components within normal limits  COMPREHENSIVE METABOLIC PANEL - Abnormal; Notable for the following components:   Sodium 132 (*)    Chloride 91 (*)    Glucose, Bld 116 (*)    Creatinine, Ser 1.63 (*)    Calcium 8.8 (*)    Albumin 3.1 (*)    AST 198 (*)    ALT 833 (*)    GFR, Estimated 34 (*)    All other components within normal limits  AMMONIA - Abnormal; Notable for the following components:   Ammonia 110 (*)    All other components within normal limits  I-STAT CHEM 8, ED - Abnormal; Notable for the following  components:   Sodium 129 (*)    Potassium 8.0 (*)    Chloride 94 (*)    BUN 29 (*)    Creatinine, Ser 1.70 (*)    Glucose, Bld 109 (*)    Calcium, Ion 1.03 (*)  TCO2 34 (*)    Hemoglobin 10.9 (*)    HCT 32.0 (*)    All other components within normal limits  CBG MONITORING, ED - Abnormal; Notable for the following components:   Glucose-Capillary 132 (*)    All other components within normal limits  ETHANOL  APTT  RAPID URINE DRUG SCREEN, HOSP PERFORMED  URINALYSIS, ROUTINE W REFLEX MICROSCOPIC  BLOOD GAS, VENOUS    EKG EKG Interpretation  Date/Time:  Wednesday May 04 2022 10:34:18 EDT Ventricular Rate:  71 PR Interval:  134 QRS Duration: 79 QT Interval:  383 QTC Calculation: 417 R Axis:   78 Text Interpretation: Sinus rhythm Atrial premature complexes Minimal ST depression, diffuse leads Confirmed by Octaviano Glow [phone]) on 05/04/2022 10:37:27 AM  Radiology CT HEAD CODE STROKE WO CONTRAST  Result Date: 05/04/2022 CLINICAL DATA:  Code stroke.  Vision loss left eye EXAM: CT HEAD WITHOUT CONTRAST TECHNIQUE: Contiguous axial images were obtained from the base of the skull through the vertex without intravenous contrast. RADIATION DOSE REDUCTION: This exam was performed according to the departmental dose-optimization program which includes automated exposure control, adjustment of the mA and/or kV according to patient size and/or use of iterative reconstruction technique. COMPARISON:  CT Head 04/08/19 FINDINGS: Brain: No hemorrhage. No hydrocephalus. No extra-axial fluid collection. Redemonstrated is a chronic infarct in the medial left frontal lobe and in the left lentiform nucleus. No CT evidence of a new cortical infarct. Vascular: No hyperdense vessel or unexpected calcification. Skull: Normal. Negative for fracture or focal lesion. Sinuses/Orbits: No middle ear or mastoid effusion. Paranasal sinuses are clear. Orbits are unremarkable. Other: None. IMPRESSION: No hemorrhage or  CT evidence of a new cortical infarct Findings were discussed with Dr. Langston Masker on 05/04/22 at 10:54 AM Electronically Signed   By: Marin Roberts M.D.   On: 05/04/2022 10:55    Procedures Procedures    Medications Ordered in ED Medications  aspirin EC tablet 81 mg (has no administration in time range)  clopidogrel (PLAVIX) tablet 75 mg (75 mg Oral Given 05/04/22 1156)  sodium chloride 0.9 % bolus 1,000 mL (has no administration in time range)  lactulose (CHRONULAC) 10 GM/15ML solution 10 g (has no administration in time range)    ED Course/ Medical Decision Making/ A&P Clinical Course as of 05/04/22 1620  Wed May 04, 2022  1034 Given the patient's reported confusion and the onset of her left eye vision loss at approx 930 AM, which appears painless, I have activated a code stroke. [MT]  1225 Admitted to hospitalist [MT]    Clinical Course User Index [MT] Yuleimy Kretz, Carola Rhine, MD                             Medical Decision Making Amount and/or Complexity of Data Reviewed Labs: ordered. Radiology: ordered.  Risk Prescription drug management. Decision regarding hospitalization.   This patient presents to the ED with concern for altered mental status, vision loss. This involves an extensive number of treatment options, and is a complaint that carries with it a high risk of complications and morbidity.  The differential diagnosis includes polypharmacy vs new CNS lesion/CVA vs electrolyte derangement vs other  Co-morbidities that complicate the patient evaluation: hx of metastatic malignancy at risk of new CNS lesions  Additional history obtained from patient's husband  External records from outside source obtained and reviewed including oncology office record from 04/26/21 noting patient and family wishing to pursue chemotherapy at  that time.  From my discussion with her husband Monica Pugh today, it appears that her family and husband now feel the patient would benefit more from home hospice  care then continued chemotherapy or aggressive treatments.  However they are looking for a second opinion from an oncologist to help clarify these goals of care.  They would also wish to pursue an immediate stroke and CNS lesion workup in the hospital to get "the full picture".  I think it is reasonable therefore to admit the patient to the hospital to complete stroke workup, and ask oncology to consult on the patient, and potentially palliative care regarding goals of care.  At this time, do not believe the patient is demonstrating capacity to make her own rational decisions at the moment, given this abrupt change in her mental status.  She is DNR with signed papers at the bedside.  I ordered and personally interpreted labs.  The pertinent results include: New transaminitis.  T bilirubin level within normal limits.  Ammonia is elevated at 110.  Creatinine has some elevation, potassium 5.0.  Labs are consistent with some developing dehydration.  Pneumonia elevation may be causing this waxing and waning confusion.  CT scan of the abdomen has been ordered now to evaluate for new liver lesion or obstructing biliary lesion, given her history of bowel malignancy.  MRI brain is also pending at the time of admission.  I ordered imaging studies including CT head I independently visualized and interpreted imaging which showed no acute infarct noted on CT imaging I agree with the radiologist interpretation  The patient was maintained on a cardiac monitor.  I personally viewed and interpreted the cardiac monitored which showed an underlying rhythm of: Regular heart rate  Per my interpretation the patient's ECG shows no acute ischemic findings  I ordered medication including lactulose for elevated ammonia.  Fluids for prerenal dehydration.  Aspirin and Plavix ordered per neurology recommendations  I have reviewed the patients home medicines and have made adjustments as needed  I requested consultation with the  neurology,  and discussed lab and imaging findings as well as pertinent plan - they recommend:  Admission for stroke workup  After the interventions noted above, I reevaluated the patient and found that they have: improved -according to the husband the patient at the status is somewhat improved, as she is now able to talk to more focus questions, but she is still altered and somewhat confused.  Social Determinants of Health: patient would likely benefit from palliative care consultation and hospital  Dispostion:  After consideration of the diagnostic results and the patients response to treatment, I feel that the patent would benefit from medical admission as noted         Final Clinical Impression(s) / ED Diagnoses Final diagnoses:  Altered mental status, unspecified altered mental status type  Vision loss, left eye  Hyperammonemia ([place])  Transaminitis    Rx / DC Orders ED Discharge Orders     None         Graig Hessling, Carola Rhine, MD 05/04/22 1620

## 2022-05-04 NOTE — Progress Notes (Signed)
Code stroke activated per elert @ [id number].  Dr Quinn Axe notified per page @ 1037, on camera to assedd @ 1047.  Pt to CT @ 1043. Assessed per Dr Quinn Axe in CT, no further imaging needed. No TNK, LKWT 2200 last night.  Pt returned to ER from [place] @ 1059.  MRS 2  Insurance claims handler

## 2022-05-04 NOTE — ED Notes (Signed)
ED TO INPATIENT HANDOFF REPORT  ED Nurse Name and Phone #: Hebert Soho, RN [phone]  S Name/Age/Gender Monica Pugh 72 y.o. female Room/Bed: WA24/WA24  Code Status   Code Status: DNR  Home/SNF/Other Home Patient oriented to: self, place, time, and situation Is this baseline? Yes   Triage Complete: Triage complete  Chief Complaint Acute ischemic stroke [facility]) [I63.9]  Triage Note Patient BIB [facility] from home. Last seen normal 10pm last night when patient went to sleep. Husband woke patient up at 8:45am and noticed patient was slow to respond and that maybe she took too much of her morphine. Diagnosed with ovarian cancer 2 weeks ago and it has spread all over her body. New onset swelling to right hand. Patient stopped taking her blood thinner last week. History of stroke 6 years ago.  Dr. Langston Masker at bedside and called a Code Stroke at 10:29am.   Allergies Allergies  Allergen Reactions   Cefadroxil Other (See Comments)    unknown    Level of Care/Admitting Diagnosis ED Disposition     ED Disposition  Admit   Condition  --   [place]: [place] [[id number]]  Level of Care: Telemetry [5]  Admit to tele based on following criteria: Monitor QTC interval  May admit patient to [facility] or [facility] if equivalent level of care is available:: Yes  Covid Evaluation: Asymptomatic - no recent exposure (last 10 days) testing not required  Diagnosis: Acute ischemic stroke [facility]) [id number]  Admitting Physician: Lucillie Garfinkel [id number]  Attending Physician: [place], MIR Kari.Conine [id number]  Certification:: I certify this patient will need inpatient services for at least 2 midnights  Estimated Length of Stay: 3          B Medical/Surgery History Past Medical History:  Diagnosis Date   Anemia    Carotid artery occlusion    GERD (gastroesophageal reflux disease)    Hearing loss    bilateral - no hearing aids   Hyperlipidemia    Intractable  nausea and vomiting 04/16/2022   Peripheral vascular disease ([place])    Sleep apnea    does not use cpap   Stroke ([place])    Vitamin B 12 deficiency    Vitamin D deficiency    Past Surgical History:  Procedure Laterality Date   ABDOMINAL HYSTERECTOMY     BIOPSY  04/18/2022   Procedure: BIOPSY;  Surgeon: Sharyn Creamer, MD;  Location: [facility];  Service: Gastroenterology;;   COLONOSCOPY     COLONOSCOPY WITH PROPOFOL N/A 04/18/2022   Procedure: COLONOSCOPY WITH PROPOFOL;  Surgeon: Sharyn Creamer, MD;  Location: [place];  Service: Gastroenterology;  Laterality: N/A;   IR ANGIO INTRA EXTRACRAN SEL COM CAROTID INNOMINATE UNI R MOD SED  05/11/2020   IR ANGIO INTRA EXTRACRAN SEL INTERNAL CAROTID UNI L MOD SED  05/11/2020   IR ANGIO INTRA EXTRACRAN SEL INTERNAL CAROTID UNI L MOD SED  06/08/2020   IR ANGIO INTRA EXTRACRAN SEL INTERNAL CAROTID UNI L MOD SED  06/26/2020   IR ANGIO INTRA EXTRACRAN SEL INTERNAL CAROTID UNI L MOD SED  06/09/2021   IR ANGIO VERTEBRAL SEL SUBCLAVIAN INNOMINATE BILAT MOD SED  05/11/2020   IR ANGIOGRAM EXTREMITY BILATERAL  06/08/2020   IR CT HEAD LTD  06/26/2020   IR ILIAC ART STENT INC PTA MOD SED  06/26/2020   IR ILIAC ART STENT INC PTA MOD SED  06/26/2020   IR INTRAVSC STENT CERV CAROTID W/EMB-PROT MOD SED INCL ANGIO  06/26/2020   IR IVUS EACH ADDITIONAL NON CORONARY VESSEL  06/26/2020   IR RADIOLOGIST EVAL & MGMT  05/28/2020   IR RADIOLOGIST EVAL & MGMT  07/15/2020   IR RADIOLOGIST EVAL & MGMT  07/27/2020   IR US GUIDE VASC ACCESS LEFT  06/26/2020   IR US GUIDE VASC ACCESS RIGHT  05/11/2020   IR US GUIDE VASC ACCESS RIGHT  06/08/2020   IR US GUIDE VASC ACCESS RIGHT  06/26/2020   IR US GUIDE VASC ACCESS RIGHT  06/09/2021   RADIOLOGY WITH ANESTHESIA N/A 05/09/2015   Procedure: RADIOLOGY WITH ANESTHESIA;  Surgeon: Luanne Bras, MD;  Location: [facility];  Service: Radiology;  Laterality: N/A;   RADIOLOGY WITH ANESTHESIA N/A 06/08/2020   Procedure: IR WITH  ANESTHESIA ANGIOPLASTY;  Surgeon: Luanne Bras, MD;  Location: [place];  Service: Radiology;  Laterality: N/A;   RADIOLOGY WITH ANESTHESIA N/A 06/26/2020   Procedure: REVASCULATION;  Surgeon: Luanne Bras, MD;  Location: [facility];  Service: Radiology;  Laterality: N/A;   SUBMUCOSAL TATTOO INJECTION  04/18/2022   Procedure: SUBMUCOSAL TATTOO INJECTION;  Surgeon: Sharyn Creamer, MD;  Location: [facility] ENDOSCOPY;  Service: Gastroenterology;;   TUBAL LIGATION       A IV Location/Drains/Wounds Patient Lines/Drains/Airways Status     Active Line/Drains/Airways     Name Placement date Placement time Site Days   Peripheral IV 05/04/22 20 G 1.25" Anterior;Left;Proximal Forearm 05/04/22  1100  Forearm  less than 1   Wound / Incision (Open or Dehisced) 05/11/20 Puncture Wrist Right arterial access puncture site 05/11/20  1001  Wrist  723            Intake/Output Last 24 hours  Intake/Output Summary (Last 24 hours) at 05/04/2022 1525 Last data filed at 05/04/2022 1504 Gross per 24 hour  Intake 1040.97 ml  Output --  Net 1040.97 ml    Labs/Imaging Results for orders placed or performed during the hospital encounter of 05/04/22 (from the past 48 hour(s))  CBG monitoring, ED     Status: Abnormal   Collection Time: 05/04/22 10:36 AM  Result Value Ref Range   Glucose-Capillary 132 (H) 70 - 99 mg/dL    Comment: Glucose reference range applies only to samples taken after fasting for at least 8 hours.  Ethanol     Status: None   Collection Time: 05/04/22 11:16 AM  Result Value Ref Range   Alcohol, Ethyl (B) <10 <10 mg/dL    Comment: (NOTE) Lowest detectable limit for serum alcohol is 10 mg/dL.  For medical purposes only. Performed at [facility], [place] [address], [place], [place] [address]   Protime-INR     Status: Abnormal   Collection Time: 05/04/22 11:16 AM  Result Value Ref Range   Prothrombin Time 16.1 (H) 11.4 - 15.2 seconds   INR 1.3 (H) 0.8 - 1.2     Comment: (NOTE) INR goal varies based on device and disease states. Performed at [facility], [place] [address] [place], [place] [address]   APTT     Status: None   Collection Time: 05/04/22 11:16 AM  Result Value Ref Range   aPTT 33 24 - 36 seconds    Comment: Performed at [facility], [place] [address] [place], [place] [address]  CBC     Status: Abnormal   Collection Time: 05/04/22 11:16 AM  Result Value Ref Range   WBC 17.8 (H) 4.0 - 10.5 K/uL   RBC 4.14 3.87 - 5.11 MIL/uL  Hemoglobin 9.7 (L) 12.0 - 15.0 g/dL   HCT 32.2 (L) 36.0 - 46.0 %   MCV 77.8 (L) 80.0 - 100.0 fL   MCH 23.4 (L) 26.0 - 34.0 pg   MCHC 30.1 30.0 - 36.0 g/dL   RDW 19.6 (H) 11.5 - 15.5 %   Platelets 433 (H) 150 - 400 K/uL   nRBC 0.0 0.0 - 0.2 %    Comment: Performed at [facility], [place] [address] [place], [place] [address]  Differential     Status: Abnormal   Collection Time: 05/04/22 11:16 AM  Result Value Ref Range   Neutrophils Relative % 85 %   Neutro Abs 15.3 (H) 1.7 - 7.7 K/uL   Lymphocytes Relative 5 %   Lymphs Abs 0.8 0.7 - 4.0 K/uL   Monocytes Relative 9 %   Monocytes Absolute 1.5 (H) 0.1 - 1.0 K/uL   Eosinophils Relative 0 %   Eosinophils Absolute 0.0 0.0 - 0.5 K/uL   Basophils Relative 0 %   Basophils Absolute 0.0 0.0 - 0.1 K/uL   Immature Granulocytes 1 %   Abs Immature Granulocytes 0.13 (H) 0.00 - 0.07 K/uL    Comment: Performed at [facility], [place] [address] [place], [place] [address]  Comprehensive metabolic panel     Status: Abnormal   Collection Time: 05/04/22 11:16 AM  Result Value Ref Range   Sodium 132 (L) 135 - 145 mmol/L   Potassium 5.0 3.5 - 5.1 mmol/L   Chloride 91 (L) 98 - 111 mmol/L   CO2 28 22 - 32 mmol/L   Glucose, Bld 116 (H) 70 - 99 mg/dL    Comment: Glucose reference range applies only to samples taken after fasting for at least 8 hours.   BUN 22 8 - 23 mg/dL   Creatinine, Ser 1.63 (H)  0.44 - 1.00 mg/dL   Calcium 8.8 (L) 8.9 - 10.3 mg/dL   Total Protein 6.7 6.5 - 8.1 g/dL   Albumin 3.1 (L) 3.5 - 5.0 g/dL   AST 198 (H) 15 - 41 U/L   ALT 833 (H) 0 - 44 U/L   Alkaline Phosphatase 105 38 - 126 U/L   Total Bilirubin 0.8 0.3 - 1.2 mg/dL   GFR, Estimated 34 (L) >60 mL/min    Comment: (NOTE) Calculated using the CKD-EPI Creatinine Equation (2021)    Anion gap 13 5 - 15    Comment: Performed at [facility], [place] [address], [place], [place] [address]  Ammonia     Status: Abnormal   Collection Time: 05/04/22 11:16 AM  Result Value Ref Range   Ammonia 110 (H) 9 - 35 umol/L    Comment: Performed at [facility], [place] [address] [place], [place] [address]  Ginger Carne 8, ED     Status: Abnormal   Collection Time: 05/04/22 12:09 PM  Result Value Ref Range   Sodium 129 (L) 135 - 145 mmol/L   Potassium 8.0 (HH) 3.5 - 5.1 mmol/L   Chloride 94 (L) 98 - 111 mmol/L   BUN 29 (H) 8 - 23 mg/dL   Creatinine, Ser 1.70 (H) 0.44 - 1.00 mg/dL   Glucose, Bld 109 (H) 70 - 99 mg/dL    Comment: Glucose reference range applies only to samples taken after fasting for at least 8 hours.   Calcium, Ion 1.03 (L) 1.15 - 1.40 mmol/L   TCO2 34 (H) 22 - 32 mmol/L   Hemoglobin 10.9 (L) 12.0 -  15.0 g/dL   HCT 32.0 (L) 36.0 - 46.0 %   Comment NOTIFIED PHYSICIAN   Blood gas, venous (at [facility] and [facility])     Status: Abnormal   Collection Time: 05/04/22 12:09 PM  Result Value Ref Range   pH, Ven 7.39 7.25 - 7.43   pCO2, Ven 55 44 - 60 mmHg   pO2, Ven 38 32 - 45 mmHg   Bicarbonate 33.3 (H) 20.0 - 28.0 mmol/L   Acid-Base Excess 6.7 (H) 0.0 - 2.0 mmol/L   O2 Saturation 64.8 %   Patient temperature 37.0     Comment: Performed at [facility], [place] [address] [place],  [address]   CT ABDOMEN PELVIS W CONTRAST  Result Date: 05/04/2022 CLINICAL DATA:  Right-sided abdominal pain and new elevated liver function studies. History of bile malignancy.  EXAM: CT ABDOMEN AND PELVIS WITH CONTRAST TECHNIQUE: Multidetector CT imaging of the abdomen and pelvis was performed using the standard protocol following bolus administration of intravenous contrast. RADIATION DOSE REDUCTION: This exam was performed according to the departmental dose-optimization program which includes automated exposure control, adjustment of the mA and/or kV according to patient size and/or use of iterative reconstruction technique. CONTRAST:  81m OMNIPAQUE IOHEXOL 300 MG/ML  SOLN COMPARISON:  04/16/2022 FINDINGS: Lower chest: 2 new adjacent nodules are noted at the right lung base. The larger more lateral lesion measures 17 mm. This is likely inflammatory or infectious. Follow-up full chest CT may be helpful. No pleural effusions. The heart is normal in size. Hiatal hernia noted with fluid in the distal esophagus. Hepatobiliary: A few small scattered low-attenuation hepatic lesions are stable. These are indeterminate but certainly could not exclude metastasis. Heterogeneous appearance of the liver parenchyma may suggest hepatitis this also could be related to non opacification of the hepatic veins. The liver parenchyma appears more normal on the delayed images. No worrisome hepatic lesions. No intrahepatic biliary dilatation. The gallbladder is unremarkable. No common bile duct dilatation. Pancreas: Normal Spleen: Normal Adrenals/Urinary Tract: Normal Stomach/Bowel: The stomach, duodenum, small bowel and colon are grossly normal. No acute inflammatory process, mass lesions or obstructive findings. Persistent asymmetric enhancing partially circumferential mass involving the sigmoid colon consistent with colon cancer. Small lymph nodes again noted in the sigmoid mesocolon. Vascular/Lymphatic: Bilateral common iliac artery stents. Advanced aortoiliac vascular disease. The major venous structures are patent. Necrotic left para-aortic lymphadenopathy again demonstrated. There is also a E soft  tissue lesion in the anterior left pelvis anterior to the sigmoid colon which measures 14 mm and is suspicious for a metastatic implant. Reproductive: The uterus is surgically absent. The right ovary is still present. I do not see the left ovary for certain. Other: Small inguinal hernias containing fat. No subcutaneous lesions. Musculoskeletal: The bony structures are. IMPRESSION: 1. Persistent asymmetric enhancing partially circumferential mass involving the sigmoid colon consistent with colon cancer. 2. Necrotic left para-aortic lymphadenopathy. 3. 14 mm soft tissue lesion in the anterior left pelvis anterior to the sigmoid colon is suspicious for a metastatic implant. 4. Stable small scattered low-attenuation hepatic lesions. These are indeterminate but certainly could not exclude metastasis. 5. Heterogeneous appearance of the liver parenchyma may suggest hepatitis or perfusion abnormality. No biliary obstruction. 6. 2 new adjacent nodules at the right lung base. This is likely inflammatory or infectious but could not exclude new metastatic disease. Follow-up full chest CT may be helpful. 7. Advanced aortoiliac vascular disease. Electronically Signed   By: PMarijo SanesM.D.   On: 05/04/2022 13:48  MR BRAIN W WO CONTRAST  Result Date: 05/04/2022 CLINICAL DATA:  Transient ischemic attack. EXAM: MRI HEAD WITHOUT AND WITH CONTRAST TECHNIQUE: Multiplanar, multiecho pulse sequences of the brain and surrounding structures were obtained without and with intravenous contrast. CONTRAST:  23m GADAVIST GADOBUTROL 1 MMOL/ML IV SOLN COMPARISON:  Brain MRI 05/10/2015.  Head CT 05/04/2022. FINDINGS: Brain: Punctate focus of acute infarction along the left middle frontal gyrus (coronal image 21 series 6). No acute intracranial hemorrhage. Encephalomalacia with surrounding hemosiderin staining from prior infarcts in the left anterior cingulate gyrus and left lentiform nucleus. Superficial siderosis along the left superior  frontal sulcus. No mass or midline shift. No hydrocephalus or extra-axial collection. 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 No foci of abnormal enhancement to suggest intracranial metastasis. Vascular: Unchanged chronic loss of the right ICA flow void. Skull and upper cervical spine: Normal marrow signal and enhancement. Sinuses/Orbits: Unremarkable. Other: None. IMPRESSION: 1. Punctate focus of acute infarction along the left middle frontal gyrus. 2. Encephalomalacia with surrounding hemosiderin staining from prior infarcts in the left anterior cingulate gyrus and left lentiform nucleus. 3. 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 Electronically Signed   By: WEmmit AlexandersM.D.   On: 05/04/2022 13:08   CT HEAD CODE STROKE WO CONTRAST  Result Date: 05/04/2022 CLINICAL DATA:  Code stroke.  Vision loss left eye EXAM: CT HEAD WITHOUT CONTRAST TECHNIQUE: Contiguous axial images were obtained from the base of the skull through the vertex without intravenous contrast. RADIATION DOSE REDUCTION: This exam was performed according to the departmental dose-optimization program which includes automated exposure control, adjustment of the mA and/or kV according to patient size and/or use of iterative reconstruction technique. COMPARISON:  CT Head 04/08/19 FINDINGS: Brain: No hemorrhage. No hydrocephalus. No extra-axial fluid collection. Redemonstrated is a chronic infarct in the medial left frontal lobe and in the left lentiform nucleus. No CT evidence of a new cortical infarct. Vascular: No hyperdense vessel or unexpected calcification. Skull: Normal. Negative for fracture or focal lesion. Sinuses/Orbits: No middle ear or mastoid effusion. Paranasal sinuses are clear. Orbits are unremarkable. Other: None. IMPRESSION: No hemorrhage or CT evidence of a new cortical infarct Findings were discussed with Dr. TLangston Maskeron 05/04/22 at 10:54 AM  Electronically Signed   By: HMarin RobertsM.D.   On: 05/04/2022 10:55    Pending Labs Unresulted Labs (From admission, onward)     Start     Ordered   05/05/22 0500  Lipid panel  (Labs)  Tomorrow morning,   R       Comments: Fasting    05/04/22 1339   05/04/22 1900  Potassium  Once,   STAT        05/04/22 1256   05/04/22 1405  CBC  Once,   R        05/04/22 1405   05/04/22 1339  Hemoglobin A1c  (Labs)  Once,   R       Comments: To assess prior glycemic control    05/04/22 1339   05/04/22 1029  Urine rapid drug screen (hosp performed)  Once,   STAT        05/04/22 1029   05/04/22 1029  Urinalysis, Routine w reflex microscopic -Urine, Clean Catch  Once,   URGENT       Question:  Specimen Source  Answer:  Urine, Clean Catch   05/04/22 1029            Vitals/Pain  Today's Vitals   05/04/22 1038 05/04/22 1039 05/04/22 1403  BP: (!) 139/51    Pulse: 76    Resp: 14    Temp: 97.6 F (36.4 C)    TempSrc: Oral    SpO2: 99%    Weight:  80.7 kg   Height:  5' 3.5" (1.613 m)   PainSc:  0-No pain 0-No pain    Isolation Precautions No active isolations  Medications Medications  aspirin EC tablet 81 mg (has no administration in time range)  clopidogrel (PLAVIX) tablet 75 mg (75 mg Oral Given 05/04/22 1156)  lactulose (CHRONULAC) 10 GM/15ML solution 10 g (10 g Oral Given 05/04/22 1341)  0.9 %  sodium chloride infusion ( Intravenous New Bag/Given 05/04/22 1341)  atorvastatin (LIPITOR) tablet 40 mg (has no administration in time range)  mirtazapine (REMERON) tablet 7.5 mg (has no administration in time range)  pantoprazole (PROTONIX) EC tablet 40 mg (has no administration in time range)  heparin injection 5,000 Units (5,000 Units Subcutaneous Given 05/04/22 1347)  acetaminophen (TYLENOL) tablet 650 mg (has no administration in time range)    Or  acetaminophen (TYLENOL) suppository 650 mg (has no administration in time range)  docusate sodium (COLACE) capsule 100 mg (100 mg Oral  Given 05/04/22 1341)   stroke: early stages of recovery book (has no administration in time range)  sodium chloride 0.9 % bolus 1,000 mL (0 mLs Intravenous Stopped 05/04/22 1504)  sodium chloride (PF) 0.9 % injection (  Given by Other 05/04/22 1238)  gadobutrol (GADAVIST) 1 MMOL/ML injection 8 mL (8 mLs Intravenous Contrast Given 05/04/22 1241)  calcium gluconate 1 g/ 50 mL sodium chloride IVPB (0 mg Intravenous Stopped 05/04/22 1504)  iohexol (OMNIPAQUE) 300 MG/ML solution 80 mL (80 mLs Intravenous Contrast Given 05/04/22 1259)    Mobility walks     Focused Assessments Neuro Assessment Handoff:  Swallow screen pass? Yes          Neuro Assessment: Within Defined Limits Neuro Checks:      Has TPA been given? No If patient is a Neuro Trauma and patient is going to OR before floor call report to [place] nurse: [phone] or [phone]   R Recommendations: See Admitting Provider Note  Report given to:   Additional Notes: N/A

## 2022-05-04 NOTE — ED Notes (Signed)
Patient transported to MRI 

## 2022-05-04 NOTE — ED Triage Notes (Addendum)
Patient BIB [facility] from home. Last seen normal 10pm last night when patient went to sleep. Husband woke patient up at 8:45am and noticed patient was slow to respond and that maybe she took too much of her morphine. Diagnosed with ovarian cancer 2 weeks ago and it has spread all over her body. New onset swelling to right hand. Patient stopped taking her blood thinner last week. History of stroke 6 years ago.  Dr. Langston Masker at bedside and called a Code Stroke at 10:29am.

## 2022-05-04 NOTE — Progress Notes (Signed)
Pt arrived from ed without iv, stated she had pulled it out downstairs before transport up here. Pt is a difficult iv stick and will need iv team. Order has been placed. Relayed information to hospitalist that pt is on a specific diet at home, pt is still npo at this time.

## 2022-05-04 NOTE — ED Notes (Signed)
Patient attempted to get up to go to bathroom during this time IV was removed, area was cleaned and wrapped

## 2022-05-04 NOTE — H&P (Addendum)
History and Physical  Monica Pugh [id number] DOB: [birth year] DOA: 05/04/2022   PCP: Bonnita Nasuti, MD   Patient coming from: Home   Chief Complaint: Confusion   HPI: Monica Pugh is a 72 y.o. female with medical history significant for anemia, carotid disease, hyperlipidemia recently diagnosed metastatic ovarian cancer presents this morning with altered mental status.  History is provided by the patient's husband, states that she was pretty normal when she went to bed last night, but this morning when she woke up, she seemed confused by the way she spoke to him, she also complained that she had no vision in her left eye.  This is new for her.  She was seen urgently by neurology in the ER, code stroke was called, stat head CT with no acute changes.  Of note, family is still in discussion about whether they want to pursue chemotherapy treatment, or perhaps hospice for her unfortunate recent diagnosis of metastatic cancer.  Currently.  Patient is resting comfortably, with her husband at the bedside.  Workup with imaging revealed the findings below.  Hospitalist was contacted for admission.  Review of Systems: Please see HPI for pertinent positives and negatives. A complete 10 system review of systems are otherwise negative.  Past Medical History:  Diagnosis Date   Anemia    Carotid artery occlusion    GERD (gastroesophageal reflux disease)    Hearing loss    bilateral - no hearing aids   Hyperlipidemia    Intractable nausea and vomiting 04/16/2022   Peripheral vascular disease (HCC)    Sleep apnea    does not use cpap   Stroke [facility])    Vitamin B 12 deficiency    Vitamin D deficiency    Past Surgical History:  Procedure Laterality Date   ABDOMINAL HYSTERECTOMY     BIOPSY  04/18/2022   Procedure: BIOPSY;  Surgeon: Sharyn Creamer, MD;  Location: [facility] ENDOSCOPY;  Service: Gastroenterology;;   COLONOSCOPY     COLONOSCOPY WITH PROPOFOL N/A 04/18/2022   Procedure: COLONOSCOPY WITH PROPOFOL;   Surgeon: Sharyn Creamer, MD;  Location: [facility] ENDOSCOPY;  Service: Gastroenterology;  Laterality: N/A;   IR ANGIO INTRA EXTRACRAN SEL COM CAROTID INNOMINATE UNI R MOD SED  05/11/2020   IR ANGIO INTRA EXTRACRAN SEL INTERNAL CAROTID UNI L MOD SED  05/11/2020   IR ANGIO INTRA EXTRACRAN SEL INTERNAL CAROTID UNI L MOD SED  06/08/2020   IR ANGIO INTRA EXTRACRAN SEL INTERNAL CAROTID UNI L MOD SED  06/26/2020   IR ANGIO INTRA EXTRACRAN SEL INTERNAL CAROTID UNI L MOD SED  06/09/2021   IR ANGIO VERTEBRAL SEL SUBCLAVIAN INNOMINATE BILAT MOD SED  05/11/2020   IR ANGIOGRAM EXTREMITY BILATERAL  06/08/2020   IR CT HEAD LTD  06/26/2020   IR ILIAC ART STENT INC PTA MOD SED  06/26/2020   IR ILIAC ART STENT INC PTA MOD SED  06/26/2020   IR INTRAVSC STENT CERV CAROTID W/EMB-PROT MOD SED INCL ANGIO  06/26/2020   IR IVUS EACH ADDITIONAL NON CORONARY VESSEL  06/26/2020   IR RADIOLOGIST EVAL & MGMT  05/28/2020   IR RADIOLOGIST EVAL & MGMT  07/15/2020   IR RADIOLOGIST EVAL & MGMT  07/27/2020   IR US GUIDE VASC ACCESS LEFT  06/26/2020   IR US GUIDE VASC ACCESS RIGHT  05/11/2020   IR US GUIDE VASC ACCESS RIGHT  06/08/2020   IR US GUIDE VASC ACCESS RIGHT  06/26/2020   IR US GUIDE VASC ACCESS RIGHT  06/09/2021  RADIOLOGY WITH ANESTHESIA N/A 05/09/2015   Procedure: RADIOLOGY WITH ANESTHESIA;  Surgeon: Luanne Bras, MD;  Location: [place];  Service: Radiology;  Laterality: N/A;   RADIOLOGY WITH ANESTHESIA N/A 06/08/2020   Procedure: IR WITH ANESTHESIA ANGIOPLASTY;  Surgeon: Luanne Bras, MD;  Location: [place];  Service: Radiology;  Laterality: N/A;   RADIOLOGY WITH ANESTHESIA N/A 06/26/2020   Procedure: REVASCULATION;  Surgeon: Luanne Bras, MD;  Location: [place];  Service: Radiology;  Laterality: N/A;   SUBMUCOSAL TATTOO INJECTION  04/18/2022   Procedure: SUBMUCOSAL TATTOO INJECTION;  Surgeon: Sharyn Creamer, MD;  Location: [facility] ENDOSCOPY;  Service: Gastroenterology;;   TUBAL LIGATION      Social History:   reports that she has been smoking cigarettes. She has a 20.00 pack-year smoking history. She has never used smokeless tobacco. She reports that she does not currently use alcohol. She reports that she does not use drugs.   Allergies  Allergen Reactions   Cefadroxil Other (See Comments)    unknown    Family History  Problem Relation Age of Onset   Cancer Mother    Hypertension Father    Heart attack Father    Cirrhosis Brother    Cancer Brother        In spine   Heart attack Brother      Prior to Admission medications   Medication Sig Start Date End Date Taking? Authorizing Provider  LASIX 20 MG tablet 1 tablet Orally Once a day for 5 days 04/26/22  Yes [provider]  aspirin EC 81 MG tablet Take 81 mg by mouth in the morning. Swallow whole.    [provider]  atorvastatin (LIPITOR) 40 MG tablet Take 40 mg by mouth in the morning.    [provider]  Cholecalciferol (DIALYVITE VITAMIN D 5000) 125 MCG (5000 UT) capsule Take 5,000 Units by mouth daily.    [provider]  clopidogrel (PLAVIX) 75 MG tablet Take 1 tablet (75 mg total) by mouth daily with breakfast. 05/21/15   Angiulli, Lavon Paganini, PA-C  Cyanocobalamin (VITAMIN B-12) 2500 MCG SUBL Take 2,500 mcg by mouth in the morning.    [provider]  mirtazapine (REMERON) 7.5 MG tablet Take 1 tablet (7.5 mg total) by mouth at bedtime. 04/25/22 05/25/22  Manuella Ghazi, Pratik D, DO  morphine (MSIR) 15 MG tablet Take 0.5 tablets (7.5 mg total) by mouth every 4 (four) hours as needed for severe pain. 04/27/22   Barbee Cough, MD  pantoprazole (PROTONIX) 40 MG tablet Take 1 tablet (40 mg total) by mouth 2 (two) times daily. Patient taking differently: Take 40 mg by mouth in the morning. 05/21/15   Angiulli, Lavon Paganini, PA-C  potassium chloride SA (KLOR-CON M) 20 MEQ tablet Take 1 tablet (20 mEq total) by mouth 2 (two) times daily for 14 days. 04/12/22 04/26/22  Barbee Cough, MD  senna-docusate  (SENOKOT-S) 8.6-50 MG tablet Take 2 tablets by mouth 2 (two) times daily. 04/25/22 05/25/22  Heath Lark D, DO    Physical Exam: BP (!) 139/51   Pulse 76   Temp 97.6 F (36.4 C) (Oral)   Resp 14   Ht 5' 3.5" (1.613 m)   Wt 80.7 kg   LMP  (LMP Unknown)   SpO2 99%   BMI 31.04 kg/m   General:  Alert, oriented, calm, in no acute distress, resting in bed appears her stated age.  Speech is clear, she is alert and oriented x 4.  She still states  that she cannot see well out of her left eye. Eyes: EOMI, clear conjuctivae, white sclerea Neck: supple, no masses, trachea mildline  Cardiovascular: RRR, no murmurs or rubs, no peripheral edema  Respiratory: clear to auscultation bilaterally, no wheezes, no crackles  Abdomen: soft, nontender, nondistended, normal bowel tones heard  Skin: dry, no rashes  Musculoskeletal: no joint effusions, normal range of motion  Psychiatric: appropriate affect, normal speech  Neurologic: extraocular muscles intact, clear speech, moving all extremities with intact sensorium          Labs on Admission:  Basic Metabolic Panel: Recent Labs  Lab 05/04/22 1116 05/04/22 1209  NA 132* 129*  K 5.0 8.0*  CL 91* 94*  CO2 28  --   GLUCOSE 116* 109*  BUN 22 29*  CREATININE 1.63* 1.70*  CALCIUM 8.8*  --    Liver Function Tests: Recent Labs  Lab 05/04/22 1116  AST 198*  ALT 833*  ALKPHOS 105  BILITOT 0.8  PROT 6.7  ALBUMIN 3.1*   No results for input(s): "LIPASE", "AMYLASE" in the last 168 hours. Recent Labs  Lab 05/04/22 1116  AMMONIA 110*   CBC: Recent Labs  Lab 05/04/22 1116 05/04/22 1209  WBC 17.8*  --   NEUTROABS 15.3*  --   HGB 9.7* 10.9*  HCT 32.2* 32.0*  MCV 77.8*  --   PLT 433*  --    Cardiac Enzymes: No results for input(s): "CKTOTAL", "CKMB", "CKMBINDEX", "TROPONINI" in the last 168 hours.  BNP (last 3 results) No results for input(s): "BNP" in the last 8760 hours.  ProBNP (last 3 results) No results for input(s): "PROBNP" in  the last 8760 hours.  CBG: Recent Labs  Lab 05/04/22 1036  GLUCAP 132*    Radiological Exams on Admission: CT HEAD CODE STROKE WO CONTRAST  Result Date: 05/04/2022 CLINICAL DATA:  Code stroke.  Vision loss left eye EXAM: CT HEAD WITHOUT CONTRAST TECHNIQUE: Contiguous axial images were obtained from the base of the skull through the vertex without intravenous contrast. RADIATION DOSE REDUCTION: This exam was performed according to the departmental dose-optimization program which includes automated exposure control, adjustment of the mA and/or kV according to patient size and/or use of iterative reconstruction technique. COMPARISON:  CT Head 04/08/19 FINDINGS: Brain: No hemorrhage. No hydrocephalus. No extra-axial fluid collection. Redemonstrated is a chronic infarct in the medial left frontal lobe and in the left lentiform nucleus. No CT evidence of a new cortical infarct. Vascular: No hyperdense vessel or unexpected calcification. Skull: Normal. Negative for fracture or focal lesion. Sinuses/Orbits: No middle ear or mastoid effusion. Paranasal sinuses are clear. Orbits are unremarkable. Other: None. IMPRESSION: No hemorrhage or CT evidence of a new cortical infarct Findings were discussed with Dr. Langston Masker on 05/04/22 at 10:54 AM Electronically Signed   By: Marin Roberts M.D.   On: 05/04/2022 10:55    MRI brain done today 05/04/2022: 1. Punctate focus of acute infarction along the left middle frontal gyrus. 2. Encephalomalacia with surrounding hemosiderin staining from prior infarcts in the left anterior cingulate gyrus and left lentiform nucleus. 3. 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 -Inpatient admission with telemetry -Check hemoglobin A1c, fasting lipids -Check 2D echo - daily ASA/Plavix and statin -Neurology Dr. Quinn Axe is already following  along -N.p.o. for now, until nurse bedside swallow screen.  PT/OT/SLP evaluations as appropriate -Permissive hypertension and further workup per neurology recommendations Active Problems:   Hemiparesis, aphasia, and dysphagia as late effect of cerebrovascular accident (CVA) ([place])   Tobacco abuse   Chronic diastolic heart failure ([place])   Essential hypertension   GERD (gastroesophageal reflux disease)   Hypothyroidism   Hyperlipidemia continue statin for now, check-Fasting lipids   Endometrial cancer, FIGO stage IIIA ([place]) -patient and family in discussion with her oncologist about utility and appropriateness of aggressive therapy, currently she is scheduled for port placement to initiate chemotherapy soon   Hyperkalemia -unclear to me whether this is a true value, or spurious result.  EKG reviewed without any T wave changes.  She has some mild acute kidney injury possibly due to dehydration, but no other reason for this hyperkalemia.  Notably, she does take potassium supplementation with her Lasix.  When he is, will give calcium gluconate, and recheck potassium level now.   AKI (acute kidney injury) ([place])   Acute ischemic stroke (HCC)  DVT prophylaxis: Lovenox  Code Status: DNR, confirmed with the patient and her husband at the bedside at the time of admission.  Time spent: 38 minutes  Birt Reinoso Neva Seat MD [facility] Pager [phone]  If 7PM-7AM, please contact night-coverage [email or web address] Password [facility]  05/04/2022, 1:03 PM

## 2022-05-04 NOTE — Progress Notes (Deleted)
[facility] Follow up Visit:  Patient Care Team: Bonnita Nasuti, MD as PCP - General (Internal Medicine) Barbee Cough, MD as Consulting Physician (Internal Medicine)  CHIEF COMPLAINTS/PURPOSE OF CONSULTATION:  Oncology History  Endometrial cancer, FIGO stage IIIA ([place])  04/06/2022 Initial Diagnosis   Endometrial cancer, FIGO stage IIIA ([place])   04/06/2022 Cancer Staging   Staging form: Corpus Uteri - Carcinoma and Carcinosarcoma, AJCC 8th Edition - Clinical stage from 04/06/2022: FIGO Stage IIIA (cT3a, cN0, cM0) - Signed by Barbee Cough, MD on 04/06/2022 Histopathologic type: Carcinoma, undifferentiated, NOS Stage prefix: Initial diagnosis Histologic grade (G): G2 Histologic grading system: 3 grade system   04/27/2022 Cancer Staging   Staging form: Corpus Uteri - Carcinoma and Carcinosarcoma, AJCC 8th Edition - Pathologic stage from 04/27/2022: FIGO Stage IVB (rpT4, pN2a, pM1) - Signed by Barbee Cough, MD on 04/27/2022 Histopathologic type: Adenocarcinoma, NOS Stage prefix: Recurrence Method of lymph node assessment: Clinical Histologic grade (G): GX Histologic grading system: 3 grade system   05/11/2022 -  Chemotherapy   Patient is on Treatment Plan : UTERINE ENDOMETRIAL Dostarlimab-gxly (500 mg) + Carboplatin (AUC 5) + Paclitaxel (175 mg/m2) q21d x 6 cycles / Dostarlimab-gxly (1000 mg) q42d x 6 cycles        HISTORY OF PRESENTING ILLNESS: Monica Pugh 72 y.o. female is here because of  endometrial cancer  Medical history notable for anemia, carotid artery disease, GERD, hearing loss, hyperlipidemia, peripheral vascular disease, sleep apnea (does not use CPAP) CVA, B12 deficiency vitamin D deficiency  April 05 2021:  Presented to [facility] ED with 8 days of vaginal bleeding  Pelvic ultrasound--showed a possibly thickened endometrium at 1.3 cm with an area of heterogeneous echogenicity near the fundus which may represent calcifications..   Anatomy poorly visualized owing to body habitus. CT abdomen pelvis--suspected 3.0 cm diameter submucosal leiomyoma and anterior upper uterus corresponding to the finding question proceeding ultrasound  May 18, 2021: Presented to gynecologic oncology with newly diagnosed grade 2 endometrioid cancer in setting of heavy postmenopausal bleeding since February 2023  May 25, 2021: CT chest for metastasis  June 17, 2021: Underwent total laparoscopic hysterectomy and bilateral salpingo-oophorectomy revealing a FIGO stage IIIa (T3a N0) endometrial cancer.  Immunohistochemistry showed loss of nuclear expression in MLH1 and PM S2 with [facility] 2 and MSH6 intact  Jun 30 2021:  Given final stage it was recommended to the patient that she receive adjuvant chemotherapy with carboplatin + paclitaxel as well as vaginal cuff brachytherapy. Patient would like to have chemotherapy locally - referral to heme/onc in [place] placed and coordinator and nurse navigator messaged. Treatment can start anytime between 4-6 weeks postop.   April 06 2022:  [place]  Patient declined pursuing any adjuvant therapy because she was concerned that the treatment would make her sicker and because the "surgeon got it all".  She also declined follow up with Medical Oncology or Gynecology.  Craving ice.  Can not tolerate oral iron.   Supposed to see Dr. Lars Mage in March  Social:  Married.  Made medical kits.  Began smoking at 72 years of age and quit age 40.  EtOH none  [place] Mother died 80 lung cancer (smoker) Father died 77 MI Brother died 40 "boone cancer" Brother died 109 cirrhosis (EtOH)   WBC 10.3 hemoglobin 10.7 MCV 78 platelet count 225; 66 segs 23 lymphs 9 monos 1 EO 1 basophil CA125 83.5 Ferritin 8 B12 298 CMP notable  for potassium 2.8 albumin 3.4  March 27, 2022: Admitted to [facility]  CT AP-circumferential wall thickening involving mid sigmoid colon with associated  hyperemia suspicious for infiltrative mass.  Associated infiltration within the sigmoid mesocolon suggest transmural infiltration.  There is associated pathologic adenopathy within the sigmoid mesentery, left periaortic lymph node group. CT chest without contrast showed bulky mediastinal nodal metastases.  Oblong nodule in right lower lobe.  Increase in size of left lower lobe nodule.    Underwent colonoscopy which revealed partially obstructing sigmoid colon mass involving two thirds of the lumen circumference.  This could not be traversed with an ultraslim colonoscope. Pathology from biopsy of the sigmoid mass demonstrated adenocarcinoma which was positive for CK7 and the gynecologic marker PAX8.  Negative for colonic markers cytokeratin 20 and CDX2.  IHC pattern supports metastasis of gynecologic or endometrial origin.  IHC also showed loss of and minor MMR proteins MLH1 and PM S2 further testing for BRAF mutation and/or MLH1 methylation testing was indicated  Surgery was consulted and given that she was not obstructed clinically she was managed conservatively  April 27 2022:  Scheduled follow up for management of Stage IV endometrial cancer.  Has gained 3 lbs.   Taking Oxycodone 5 mg po q 4 hrs PRN.  She is also using ibuprofen 800 mg PRN Will stop Ibuprofen, change Oxycodone to MSIR 7.5 mg po q4 hrs PRN.  Stopping oral iron  WBC 10.9 hemoglobin 9.1 MCV 79 platelet count 517; 79 segs 10 lymphs 8 monos 2 eos 1 basophil Albumin 2.9 calcium 8.5  May 04, 2022: Scheduled follow-up for management of stage IV endometrial cancer and anemia Venofer 200 mg  May 06, 2022: Venofer 200 mg   Review of Systems  Constitutional:  Negative for appetite change, chills, fatigue, fever and unexpected weight change.       Has gained weight.  Appetite fair  HENT:   Positive for hearing loss. Negative for lump/mass, mouth sores, nosebleeds, sore throat, tinnitus, trouble swallowing and voice change.   Eyes:   Negative for eye problems and icterus.       Vision changes:  None  Respiratory:  Negative for chest tightness, cough, hemoptysis, shortness of breath and wheezing.        PND:  none Orthopnea:  none DOE:    Cardiovascular:  Negative for chest pain, leg swelling and palpitations.       PND:  none Orthopnea:  none  Gastrointestinal:  Positive for constipation, nausea and vomiting. Negative for abdominal distention, abdominal pain, blood in stool and diarrhea.  Endocrine: Negative for hot flashes.       Cold intolerance:  none Heat intolerance:  none  Genitourinary:  Positive for frequency. Negative for difficulty urinating, dysuria, hematuria, nocturia and vaginal bleeding.        Has some stress incontinence and suprapubic pain  Musculoskeletal:  Negative for arthralgias, back pain, gait problem, myalgias, neck pain and neck stiffness.  Skin:  Negative for itching, rash and wound.  Neurological:  Positive for extremity weakness. Negative for dizziness, gait problem, light-headedness, numbness, seizures and speech difficulty.       Occasional right sided headaches.    Hematological:  Negative for adenopathy. Does not bruise/bleed easily.  Psychiatric/Behavioral:  Negative for sleep disturbance and suicidal ideas. The patient is not nervous/anxious.     MEDICAL HISTORY: Past Medical History:  Diagnosis Date   Anemia    Carotid artery occlusion    GERD (gastroesophageal reflux disease)  Hearing loss    bilateral - no hearing aids   Hyperlipidemia    Intractable nausea and vomiting 04/16/2022   Peripheral vascular disease ([place])    Sleep apnea    does not use cpap   Stroke ([place])    Vitamin B 12 deficiency    Vitamin D deficiency     SURGICAL HISTORY: Past Surgical History:  Procedure Laterality Date   ABDOMINAL HYSTERECTOMY     BIOPSY  04/18/2022   Procedure: BIOPSY;  Surgeon: Sharyn Creamer, MD;  Location: [facility] ENDOSCOPY;  Service: Gastroenterology;;   COLONOSCOPY      COLONOSCOPY WITH PROPOFOL N/A 04/18/2022   Procedure: COLONOSCOPY WITH PROPOFOL;  Surgeon: Sharyn Creamer, MD;  Location: [facility] ENDOSCOPY;  Service: Gastroenterology;  Laterality: N/A;   IR ANGIO INTRA EXTRACRAN SEL COM CAROTID INNOMINATE UNI R MOD SED  05/11/2020   IR ANGIO INTRA EXTRACRAN SEL INTERNAL CAROTID UNI L MOD SED  05/11/2020   IR ANGIO INTRA EXTRACRAN SEL INTERNAL CAROTID UNI L MOD SED  06/08/2020   IR ANGIO INTRA EXTRACRAN SEL INTERNAL CAROTID UNI L MOD SED  06/26/2020   IR ANGIO INTRA EXTRACRAN SEL INTERNAL CAROTID UNI L MOD SED  06/09/2021   IR ANGIO VERTEBRAL SEL SUBCLAVIAN INNOMINATE BILAT MOD SED  05/11/2020   IR ANGIOGRAM EXTREMITY BILATERAL  06/08/2020   IR CT HEAD LTD  06/26/2020   IR ILIAC ART STENT INC PTA MOD SED  06/26/2020   IR ILIAC ART STENT INC PTA MOD SED  06/26/2020   IR INTRAVSC STENT CERV CAROTID W/EMB-PROT MOD SED INCL ANGIO  06/26/2020   IR IVUS EACH ADDITIONAL NON CORONARY VESSEL  06/26/2020   IR RADIOLOGIST EVAL & MGMT  05/28/2020   IR RADIOLOGIST EVAL & MGMT  07/15/2020   IR RADIOLOGIST EVAL & MGMT  07/27/2020   IR US GUIDE VASC ACCESS LEFT  06/26/2020   IR US GUIDE VASC ACCESS RIGHT  05/11/2020   IR US GUIDE VASC ACCESS RIGHT  06/08/2020   IR US GUIDE VASC ACCESS RIGHT  06/26/2020   IR US GUIDE VASC ACCESS RIGHT  06/09/2021   RADIOLOGY WITH ANESTHESIA N/A 05/09/2015   Procedure: RADIOLOGY WITH ANESTHESIA;  Surgeon: Luanne Bras, MD;  Location: [place];  Service: Radiology;  Laterality: N/A;   RADIOLOGY WITH ANESTHESIA N/A 06/08/2020   Procedure: IR WITH ANESTHESIA ANGIOPLASTY;  Surgeon: Luanne Bras, MD;  Location: [place];  Service: Radiology;  Laterality: N/A;   RADIOLOGY WITH ANESTHESIA N/A 06/26/2020   Procedure: REVASCULATION;  Surgeon: Luanne Bras, MD;  Location: [place];  Service: Radiology;  Laterality: N/A;   SUBMUCOSAL TATTOO INJECTION  04/18/2022   Procedure: SUBMUCOSAL TATTOO INJECTION;  Surgeon: Sharyn Creamer, MD;  Location: [facility]  ENDOSCOPY;  Service: Gastroenterology;;   TUBAL LIGATION      SOCIAL HISTORY: Social History   Socioeconomic History   Marital status: Married    Spouse name: Not on file   Number of children: Not on file   Years of education: Not on file   Highest education level: Not on file  Occupational History   Not on file  Tobacco Use   Smoking status: Every Day    Packs/day: 0.50    Years: 40.00    Total pack years: 20.00    Types: Cigarettes   Smokeless tobacco: Never   Tobacco comments:    1-1 1/2 ppd  Vaping Use   Vaping Use: Never used  Substance and Sexual Activity   Alcohol use: Not  Currently    Alcohol/week: 0.0 standard drinks of alcohol   Drug use: Never   Sexual activity: Not on file  Other Topics Concern   Not on file  Social History Narrative   Not on file   Social Determinants of Health   Financial Resource Strain: Not on file  Food Insecurity: No Food Insecurity (04/17/2022)   Hunger Vital Sign    Worried About Running Out of Food in the Last Year: Never true    Ran Out of Food in the Last Year: Never true  Transportation Needs: No Transportation Needs (04/17/2022)   PRAPARE - Hydrologist (Medical): No    Lack of Transportation (Non-Medical): No  Physical Activity: Not on file  Stress: Not on file  Social Connections: Not on file  Intimate Partner Violence: Not At Risk (04/17/2022)   Humiliation, Afraid, Rape, and Kick questionnaire    Fear of Current or Ex-Partner: No    Emotionally Abused: No    Physically Abused: No    Sexually Abused: No    FAMILY HISTORY Family History  Problem Relation Age of Onset   Cancer Mother    Hypertension Father    Heart attack Father    Cirrhosis Brother    Cancer Brother        In spine   Heart attack Brother     ALLERGIES:  is allergic to cefadroxil.  MEDICATIONS:  Current Outpatient Medications  Medication Sig Dispense Refill   aspirin EC 81 MG tablet Take 81 mg by mouth in the  morning. Swallow whole.     atorvastatin (LIPITOR) 40 MG tablet Take 40 mg by mouth in the morning.     Cholecalciferol (DIALYVITE VITAMIN D 5000) 125 MCG (5000 UT) capsule Take 5,000 Units by mouth daily.     clopidogrel (PLAVIX) 75 MG tablet Take 1 tablet (75 mg total) by mouth daily with breakfast. 30 tablet 1   Cyanocobalamin (VITAMIN B-12) 2500 MCG SUBL Take 2,500 mcg by mouth in the morning.     mirtazapine (REMERON) 7.5 MG tablet Take 1 tablet (7.5 mg total) by mouth at bedtime. 30 tablet 0   morphine (MSIR) 15 MG tablet Take 0.5 tablets (7.5 mg total) by mouth every 4 (four) hours as needed for severe pain. 60 tablet 0   pantoprazole (PROTONIX) 40 MG tablet Take 1 tablet (40 mg total) by mouth 2 (two) times daily. (Patient taking differently: Take 40 mg by mouth in the morning.) 60 tablet 2   potassium chloride SA (KLOR-CON M) 20 MEQ tablet Take 1 tablet (20 mEq total) by mouth 2 (two) times daily for 14 days. 28 tablet 0   senna-docusate (SENOKOT-S) 8.6-50 MG tablet Take 2 tablets by mouth 2 (two) times daily. 120 tablet 0   No current facility-administered medications for this visit.    PHYSICAL EXAMINATION:  ECOG PERFORMANCE STATUS: 2 - Symptomatic, <50% confined to bed   There were no vitals filed for this visit.   There were no vitals filed for this visit.    Physical Exam Vitals and nursing note reviewed.  Constitutional:      Appearance: Normal appearance. She is not toxic-appearing or diaphoretic.     Comments: Here with husband  HENT:     Head: Normocephalic and atraumatic.     Right Ear: External ear normal.     Left Ear: External ear normal.     Nose: Nose normal. No congestion or rhinorrhea.  Eyes:  General: No scleral icterus.    Extraocular Movements: Extraocular movements intact.     Conjunctiva/sclera: Conjunctivae normal.     Pupils: Pupils are equal, round, and reactive to light.  Cardiovascular:     Rate and Rhythm: Normal rate.     Heart  sounds: No murmur heard.    No friction rub. No gallop.  Pulmonary:     Effort: Pulmonary effort is normal. No respiratory distress.     Breath sounds: Normal breath sounds. No stridor. No wheezing or rhonchi.  Abdominal:     General: Bowel sounds are normal.     Palpations: Abdomen is soft.  Musculoskeletal:        General: No swelling, tenderness or deformity.     Cervical back: Normal range of motion and neck supple. No rigidity or tenderness.  Lymphadenopathy:     Head:     Right side of head: No submental, submandibular, tonsillar, preauricular, posterior auricular or occipital adenopathy.     Left side of head: No submental, submandibular, tonsillar, preauricular, posterior auricular or occipital adenopathy.     Cervical: No cervical adenopathy.     Right cervical: No superficial, deep or posterior cervical adenopathy.    Left cervical: No superficial, deep or posterior cervical adenopathy.     Upper Body:     Right upper body: No supraclavicular, axillary, pectoral or epitrochlear adenopathy.     Left upper body: No supraclavicular, axillary, pectoral or epitrochlear adenopathy.  Skin:    General: Skin is warm.     Coloration: Skin is pale. Skin is not jaundiced.     Findings: No erythema.  Neurological:     General: No focal deficit present.     Mental Status: She is alert and oriented to person, place, and time.     Comments: Hard of hearing  Psychiatric:        Mood and Affect: Mood normal.        Behavior: Behavior normal.        Thought Content: Thought content normal.        Judgment: Judgment normal.    LABORATORY DATA: I have personally reviewed the data as listed:  Office Visit on 04/27/2022  Component Date Value Ref Range Status   WBC 04/27/2022 10.9 (H)  4.0 - 10.5 K/uL Final   RBC 04/27/2022 3.84 (L)  3.87 - 5.11 MIL/uL Final   Hemoglobin 04/27/2022 9.1 (L)  12.0 - 15.0 g/dL Final   HCT 04/27/2022 30.3 (L)  36.0 - 46.0 % Final   MCV 04/27/2022 78.9 (L)   80.0 - 100.0 fL Final   MCH 04/27/2022 23.7 (L)  26.0 - 34.0 pg Final   MCHC 04/27/2022 30.0  30.0 - 36.0 g/dL Final   RDW 04/27/2022 20.3 (H)  11.5 - 15.5 % Final   Platelets 04/27/2022 517 (H)  150 - 400 K/uL Final   nRBC 04/27/2022 0.0  0.0 - 0.2 % Final   Neutrophils Relative % 04/27/2022 79  % Final   Neutro Abs 04/27/2022 8.7 (H)  1.7 - 7.7 K/uL Final   Lymphocytes Relative 04/27/2022 10  % Final   Lymphs Abs 04/27/2022 1.1  0.7 - 4.0 K/uL Final   Monocytes Relative 04/27/2022 8  % Final   Monocytes Absolute 04/27/2022 0.9  0.1 - 1.0 K/uL Final   Eosinophils Relative 04/27/2022 2  % Final   Eosinophils Absolute 04/27/2022 0.2  0.0 - 0.5 K/uL Final   Basophils Relative 04/27/2022 1  % Final  Basophils Absolute 04/27/2022 0.1  0.0 - 0.1 K/uL Final   Immature Granulocytes 04/27/2022 0  % Final   Abs Immature Granulocytes 04/27/2022 0.02  0.00 - 0.07 K/uL Final   Performed at [facility], [place] [address] [place], [place] [address]   Sodium 04/27/2022 132 (L)  135 - 145 mmol/L Final   Potassium 04/27/2022 4.1  3.5 - 5.1 mmol/L Final   Chloride 04/27/2022 98  98 - 111 mmol/L Final   CO2 04/27/2022 27  22 - 32 mmol/L Final   Glucose, Bld 04/27/2022 87  70 - 99 mg/dL Final   Glucose reference range applies only to samples taken after fasting for at least 8 hours.   BUN 04/27/2022 10  8 - 23 mg/dL Final   Creatinine, Ser 04/27/2022 0.68  0.44 - 1.00 mg/dL Final   Calcium 04/27/2022 8.5 (L)  8.9 - 10.3 mg/dL Final   Total Protein 04/27/2022 6.8  6.5 - 8.1 g/dL Final   Albumin 04/27/2022 2.9 (L)  3.5 - 5.0 g/dL Final   AST 04/27/2022 17  15 - 41 U/L Final   ALT 04/27/2022 16  0 - 44 U/L Final   Alkaline Phosphatase 04/27/2022 93  38 - 126 U/L Final   Total Bilirubin 04/27/2022 0.5  0.3 - 1.2 mg/dL Final   GFR, Estimated 04/27/2022 >60  >60 mL/min Final   Comment: (NOTE) Calculated using the CKD-EPI Creatinine Equation (2021)    Anion gap 04/27/2022 7  5 - 15  Final   Performed at [facility], [place] [address] [place], [place] [address]  No results displayed because visit has over 200 results.    Appointment on 04/06/2022  Component Date Value Ref Range Status   Cancer Antigen (CA) 125 04/06/2022 83.5 (H)  0.0 - 38.1 U/mL Final   Comment: (NOTE) Roche Diagnostics Electrochemiluminescence Immunoassay (ECLIA) Values obtained with different assay methods or kits cannot be used interchangeably.  Results cannot be interpreted as absolute evidence of the presence or absence of malignant disease. Performed At: [facility] [place], [place] [id number] Rush Farmer MD [id number]    Vitamin B-12 04/06/2022 298  180 - 914 pg/mL Final   Comment: (NOTE) This assay is not validated for testing neonatal or myeloproliferative syndrome specimens for Vitamin B12 levels. Performed at [facility], [place] [address], [place], [place] [address]    Ferritin 04/06/2022 8 (L)  11 - 307 ng/mL Final   Performed at [place] [address], [place], [place] [address]  Office Visit on 04/06/2022  Component Date Value Ref Range Status   WBC 04/06/2022 10.3  4.0 - 10.5 K/uL Final   RBC 04/06/2022 4.64  3.87 - 5.11 MIL/uL Final   Hemoglobin 04/06/2022 10.7 (L)  12.0 - 15.0 g/dL Final   HCT 04/06/2022 36.0  36.0 - 46.0 % Final   MCV 04/06/2022 77.6 (L)  80.0 - 100.0 fL Final   MCH 04/06/2022 23.1 (L)  26.0 - 34.0 pg Final   MCHC 04/06/2022 29.7 (L)  30.0 - 36.0 g/dL Final   RDW 04/06/2022 20.9 (H)  11.5 - 15.5 % Final   Platelets 04/06/2022 225  150 - 400 K/uL Final   Comment: SPECIMEN CHECKED FOR CLOTS REPEATED TO VERIFY PLATELET COUNT CONFIRMED BY SMEAR    nRBC 04/06/2022 0.0  0.0 - 0.2 % Final   Neutrophils Relative % 04/06/2022 66  % Final   Neutro Abs 04/06/2022 6.7  1.7 -  7.7 K/uL Final   Lymphocytes Relative 04/06/2022 23  % Final   Lymphs Abs 04/06/2022 2.4  0.7 - 4.0  K/uL Final   Monocytes Relative 04/06/2022 9  % Final   Monocytes Absolute 04/06/2022 1.0  0.1 - 1.0 K/uL Final   Eosinophils Relative 04/06/2022 1  % Final   Eosinophils Absolute 04/06/2022 0.1  0.0 - 0.5 K/uL Final   Basophils Relative 04/06/2022 1  % Final   Basophils Absolute 04/06/2022 0.1  0.0 - 0.1 K/uL Final   Immature Granulocytes 04/06/2022 0  % Final   Abs Immature Granulocytes 04/06/2022 0.02  0.00 - 0.07 K/uL Final   Performed at [facility], [place] [address] [place], [place] [address]   Sodium 04/06/2022 138  135 - 145 mmol/L Final   Potassium 04/06/2022 2.8 (L)  3.5 - 5.1 mmol/L Final   Chloride 04/06/2022 96 (L)  98 - 111 mmol/L Final   CO2 04/06/2022 27  22 - 32 mmol/L Final   Glucose, Bld 04/06/2022 79  70 - 99 mg/dL Final   Glucose reference range applies only to samples taken after fasting for at least 8 hours.   BUN 04/06/2022 9  8 - 23 mg/dL Final   Creatinine, Ser 04/06/2022 0.96  0.44 - 1.00 mg/dL Final   Calcium 04/06/2022 8.8 (L)  8.9 - 10.3 mg/dL Final   Total Protein 04/06/2022 6.7  6.5 - 8.1 g/dL Final   Albumin 04/06/2022 3.4 (L)  3.5 - 5.0 g/dL Final   AST 04/06/2022 15  15 - 41 U/L Final   ALT 04/06/2022 8  0 - 44 U/L Final   Alkaline Phosphatase 04/06/2022 100  38 - 126 U/L Final   Total Bilirubin 04/06/2022 0.6  0.3 - 1.2 mg/dL Final   GFR, Estimated 04/06/2022 >60  >60 mL/min Final   Comment: (NOTE) Calculated using the CKD-EPI Creatinine Equation (2021)    Anion gap 04/06/2022 15  5 - 15 Final   Performed at [facility], [place] [address], [place], [place] [address]    RADIOGRAPHIC STUDIES: I have personally reviewed the radiological images as listed and agree with the findings in the report  No results found.  ASSESSMENT/PLAN   72 y.o. female with medical history notable for anemia, carotid artery disease, GERD, hearing loss, hyperlipidemia, peripheral vascular disease, sleep apnea (does not use CPAP)  CVA, B12 deficiency vitamin D deficiency.  Patient has history of endometrial carcinoma treated with resection in April 2023  Endometrial carcinoma, FIGO  Stage IIIA (T3a N0 M0), Grade 2, MSI-H:   June 17 2021:  Total laparoscopic hysterectomy and bilateral salpingo-oophorectomy performed Adjuvant chemotherapy and XRT recommended which patient declined .   Patient also declined regular follow up March 27 2022:  CT CAP bulky mediastinal nodal mets, wall thickening in sigmoid colon and mesentery, pathologic intra-abdominal adenopathy.  Bx consistent with endometrial cancer  Therapeutics April 27 2022: In light of patient having Stage IV disease goal of care is palliative.  Options are chemotherapy vs hospice.  Patient is at high risk for complication owing to her comorbidities and decreased performance status.  Patient and husband would like to proceed with a trial of chemotherapy which would consist of Taxol/Carbo/ Dostarlimab-gxly   MSI High status:  Suggests that patient should be evaluated for Lynch syndrome.  Should she develop recurrent endometrial cancer would potentially benefit from immunotherapy  Anemia:  Secondary to iron deficiency from GI blood loss due to involvement of colon from  metastatic endometrial cancer  April 06 2022:  IV iron written for but not given due to intercurrent illness  May 02 2022:  To receive IV iron  Poor venous access:  In preparation for chemotherapy we will arrange for port-a-cath placement.  We discussed benefits (more efficient access to administer chemotherapy and supportive care medications/IVF, imaging contrast, decreased risk of tissue damage from medications) and disadvantages (risks of infections, thrombosis, surgical procedure, periodic flushing).  Patient has elected to proceed with port placement.       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  April 06 2022:  Will recommend Genetics consult    Cancer Staging  Endometrial cancer, FIGO stage IIIA  ([place]) Staging form: Corpus Uteri - Carcinoma and Carcinosarcoma, AJCC 8th Edition - Clinical stage from 04/06/2022: FIGO Stage IIIA (cT3a, cN0, cM0) - Signed by Barbee Cough, MD on 04/06/2022 Histopathologic type: Carcinoma, undifferentiated, NOS Stage prefix: Initial diagnosis Histologic grade (G): G2 Histologic grading system: 3 grade system - Pathologic stage from 04/27/2022: FIGO Stage IVB (rpT4, pN2a, pM1) - Signed by Barbee Cough, MD on 04/27/2022 Histopathologic type: Adenocarcinoma, NOS Stage prefix: Recurrence Method of lymph node assessment: Clinical Histologic grade (G): GX Histologic grading system: 3 grade system    No problem-specific Assessment & Plan notes found for this encounter.   No orders of the defined types were placed in this encounter.  109  minutes was spent in patient care.  This included time spent preparing to see the patient (e.g., review of tests), obtaining and/or reviewing separately obtained history, counseling and educating the patient/family/caregiver, ordering tests; medications, procedures, documenting clinical information in the electronic or other health record, independently interpreting results and communicating results to the patient/family/caregiver as well as coordination of care.      All questions were answered. The patient knows to call the clinic with any problems, questions or concerns.  This note was electronically signed.    Barbee Cough, MD  05/04/2022 8:31 AM

## 2022-05-04 NOTE — Consult Note (Signed)
NEUROLOGY TELECONSULTATION NOTE   Date of service: May 04, 2022 Patient Name: Monica Pugh MRN:  [id number] DOB:  1950-09-02 Reason for consult: telestroke  Requesting Provider: Dr. Langston Masker Consult Participants: myself, bedside RN, telestroke RN, patient Location of the provider: [facility] Location of the patient: [facility]  This consult was provided via telemedicine with 2-way video and audio communication. The patient/family was informed that care would be provided in this way and agreed to receive care in this manner.   _ _ _   _ __   _ __ _ _  __ __   _ __   __ _  History of Present Illness   This is a 72 yo patient with hx anemia, carotid occlusion, HL, prior stroke, recent dx metastatic ovarian cancer who presents with AMS, LKW last night. Patient also reports that she cannot see out of her L eye since shortly after waking up at 0600. On exam she has no vision in her L eye, otherwise no focal deficits. She is completely oriented for me. NIHSS = 2 for visual fields. Head CT no acute process, chronic infarct in the medial left frontal lobe and in the left lentiform nucleus, personal review. TNK not administered 2/2 presentation outside the window. CTA not performed 2/2 exam not c/w LVO. Family considering hospice in the setting of new dx metastatic ovarian cancer but have not made a final decision about this yet.   ROS   Per HPI; all other systems reviewed and are negative  Past History   The following was personally reviewed:  Past Medical History:  Diagnosis Date   Anemia    Carotid artery occlusion    GERD (gastroesophageal reflux disease)    Hearing loss    bilateral - no hearing aids   Hyperlipidemia    Intractable nausea and vomiting 04/16/2022   Peripheral vascular disease (HCC)    Sleep apnea    does not use cpap   Stroke ([place])    Vitamin B 12 deficiency    Vitamin D deficiency    Past Surgical History:  Procedure Laterality Date   ABDOMINAL HYSTERECTOMY     BIOPSY   04/18/2022   Procedure: BIOPSY;  Surgeon: Sharyn Creamer, MD;  Location: [facility] ENDOSCOPY;  Service: Gastroenterology;;   COLONOSCOPY     COLONOSCOPY WITH PROPOFOL N/A 04/18/2022   Procedure: COLONOSCOPY WITH PROPOFOL;  Surgeon: Sharyn Creamer, MD;  Location: [facility] ENDOSCOPY;  Service: Gastroenterology;  Laterality: N/A;   IR ANGIO INTRA EXTRACRAN SEL COM CAROTID INNOMINATE UNI R MOD SED  05/11/2020   IR ANGIO INTRA EXTRACRAN SEL INTERNAL CAROTID UNI L MOD SED  05/11/2020   IR ANGIO INTRA EXTRACRAN SEL INTERNAL CAROTID UNI L MOD SED  06/08/2020   IR ANGIO INTRA EXTRACRAN SEL INTERNAL CAROTID UNI L MOD SED  06/26/2020   IR ANGIO INTRA EXTRACRAN SEL INTERNAL CAROTID UNI L MOD SED  06/09/2021   IR ANGIO VERTEBRAL SEL SUBCLAVIAN INNOMINATE BILAT MOD SED  05/11/2020   IR ANGIOGRAM EXTREMITY BILATERAL  06/08/2020   IR CT HEAD LTD  06/26/2020   IR ILIAC ART STENT INC PTA MOD SED  06/26/2020   IR ILIAC ART STENT INC PTA MOD SED  06/26/2020   IR INTRAVSC STENT CERV CAROTID W/EMB-PROT MOD SED INCL ANGIO  06/26/2020   IR IVUS EACH ADDITIONAL NON CORONARY VESSEL  06/26/2020   IR RADIOLOGIST EVAL & MGMT  05/28/2020   IR RADIOLOGIST EVAL & MGMT  07/15/2020   IR RADIOLOGIST EVAL &  MGMT  07/27/2020   IR US GUIDE VASC ACCESS LEFT  06/26/2020   IR US GUIDE VASC ACCESS RIGHT  05/11/2020   IR US GUIDE VASC ACCESS RIGHT  06/08/2020   IR US GUIDE VASC ACCESS RIGHT  06/26/2020   IR US GUIDE VASC ACCESS RIGHT  06/09/2021   RADIOLOGY WITH ANESTHESIA N/A 05/09/2015   Procedure: RADIOLOGY WITH ANESTHESIA;  Surgeon: Luanne Bras, MD;  Location: [place];  Service: Radiology;  Laterality: N/A;   RADIOLOGY WITH ANESTHESIA N/A 06/08/2020   Procedure: IR WITH ANESTHESIA ANGIOPLASTY;  Surgeon: Luanne Bras, MD;  Location: [place];  Service: Radiology;  Laterality: N/A;   RADIOLOGY WITH ANESTHESIA N/A 06/26/2020   Procedure: REVASCULATION;  Surgeon: Luanne Bras, MD;  Location: [place];  Service: Radiology;   Laterality: N/A;   SUBMUCOSAL TATTOO INJECTION  04/18/2022   Procedure: SUBMUCOSAL TATTOO INJECTION;  Surgeon: Sharyn Creamer, MD;  Location: [facility] ENDOSCOPY;  Service: Gastroenterology;;   TUBAL LIGATION     Family History  Problem Relation Age of Onset   Cancer Mother    Hypertension Father    Heart attack Father    Cirrhosis Brother    Cancer Brother        In spine   Heart attack Brother    Social History   Socioeconomic History   Marital status: Married    Spouse name: Not on file   Number of children: Not on file   Years of education: Not on file   Highest education level: Not on file  Occupational History   Not on file  Tobacco Use   Smoking status: Every Day    Packs/day: 0.50    Years: 40.00    Total pack years: 20.00    Types: Cigarettes   Smokeless tobacco: Never   Tobacco comments:    1-1 1/2 ppd  Vaping Use   Vaping Use: Never used  Substance and Sexual Activity   Alcohol use: Not Currently    Alcohol/week: 0.0 standard drinks of alcohol   Drug use: Never   Sexual activity: Not on file  Other Topics Concern   Not on file  Social History Narrative   Not on file   Social Determinants of Health   Financial Resource Strain: Not on file  Food Insecurity: No Food Insecurity (04/17/2022)   Hunger Vital Sign    Worried About Running Out of Food in the Last Year: Never true    Ran Out of Food in the Last Year: Never true  Transportation Needs: No Transportation Needs (04/17/2022)   PRAPARE - Hydrologist (Medical): No    Lack of Transportation (Non-Medical): No  Physical Activity: Not on file  Stress: Not on file  Social Connections: Not on file   Allergies  Allergen Reactions   Cefadroxil Other (See Comments)    unknown    Medications   (Not in a hospital admission)     Current Facility-Administered Medications:    [START ON 05/05/2022] aspirin EC tablet 81 mg, 81 mg, Oral, Daily, Derek Jack, MD   clopidogrel  (PLAVIX) tablet 75 mg, 75 mg, Oral, Daily, Derek Jack, MD  Current Outpatient Medications:    aspirin EC 81 MG tablet, Take 81 mg by mouth in the morning. Swallow whole., Disp: , Rfl:    atorvastatin (LIPITOR) 40 MG tablet, Take 40 mg by mouth in the morning., Disp: , Rfl:    Cholecalciferol (DIALYVITE VITAMIN D 5000) 125 MCG (5000  UT) capsule, Take 5,000 Units by mouth daily., Disp: , Rfl:    clopidogrel (PLAVIX) 75 MG tablet, Take 1 tablet (75 mg total) by mouth daily with breakfast., Disp: 30 tablet, Rfl: 1   Cyanocobalamin (VITAMIN B-12) 2500 MCG SUBL, Take 2,500 mcg by mouth in the morning., Disp: , Rfl:    mirtazapine (REMERON) 7.5 MG tablet, Take 1 tablet (7.5 mg total) by mouth at bedtime., Disp: 30 tablet, Rfl: 0   morphine (MSIR) 15 MG tablet, Take 0.5 tablets (7.5 mg total) by mouth every 4 (four) hours as needed for severe pain., Disp: 60 tablet, Rfl: 0   pantoprazole (PROTONIX) 40 MG tablet, Take 1 tablet (40 mg total) by mouth 2 (two) times daily. (Patient taking differently: Take 40 mg by mouth in the morning.), Disp: 60 tablet, Rfl: 2   potassium chloride SA (KLOR-CON M) 20 MEQ tablet, Take 1 tablet (20 mEq total) by mouth 2 (two) times daily for 14 days., Disp: 28 tablet, Rfl: 0   senna-docusate (SENOKOT-S) 8.6-50 MG tablet, Take 2 tablets by mouth 2 (two) times daily., Disp: 120 tablet, Rfl: 0  Vitals   Vitals:   05/04/22 1038 05/04/22 1039  BP: (!) 139/51   Pulse: 76   Resp: 14   Temp: 97.6 F (36.4 C)   TempSrc: Oral   SpO2: 99%   Weight:  80.7 kg  Height:  5' 3.5" (1.613 m)     Body mass index is 31.04 kg/m.  Physical Exam   Exam performed over telemedicine with 2-way video and audio communication and with assistance of bedside RN  Physical Exam Gen: A&O x4, NAD Resp: normal WOB CV: extremities appear well-perfused  Neuro: *MS: A&O x4. Follows multi-step commands.  *Speech: nondysarthric, no aphasia, able to name and repeat *CN: PERRL 42m, EOMI,  VFF R eye, no vision L eye, sensation intact, smile symmetric, hearing intact to voice *Motor:   Normal bulk.  No tremor, rigidity or bradykinesia. No pronator drift. All extremities appear full-strength and symmetric. *Sensory: SILT. Symmetric. No double-simultaneous extinction.  *Coordination:  Finger-to-nose, heel-to-shin, rapid alternating motions were intact. *Reflexes:  UTA 2/2 tele-exam *Gait: deferred  NIHSS = 2 for VF  Premorbid mRS = 2   Labs   CBC:  Recent Labs  Lab 04/27/22 1151  WBC 10.9*  NEUTROABS 8.7*  HGB 9.1*  HCT 30.3*  MCV 78.9*  PLT 517*    Basic Metabolic Panel:  Lab Results  Component Value Date   NA 132 (L) 04/27/2022   K 4.1 04/27/2022   CO2 27 04/27/2022   GLUCOSE 87 04/27/2022   BUN 10 04/27/2022   CREATININE 0.68 04/27/2022   CALCIUM 8.5 (L) 04/27/2022   GFRNONAA >60 04/27/2022   GFRAA >60 05/21/2015   Lipid Panel:  Lab Results  Component Value Date   LDLCALC 82 05/10/2015   HgbA1c:  Lab Results  Component Value Date   HGBA1C 5.7 (H) 05/10/2015   Urine Drug Screen:     Component Value Date/Time   LABOPIA NONE DETECTED 05/09/2015 1224   COCAINSCRNUR NONE DETECTED 05/09/2015 1224   LABBENZ NONE DETECTED 05/09/2015 1224   AMPHETMU NONE DETECTED 05/09/2015 1224   THCU NONE DETECTED 05/09/2015 1224   LABBARB NONE DETECTED 05/09/2015 1224    Alcohol Level     Component Value Date/Time   ETH <5 05/09/2015 1152     Impression   This is a 72yo patient with hx anemia, carotid occlusion, HL, prior stroke, recent dx metastatic ovarian  cancer who presents with AMS since last night, and loss of vision in L eye. TNK not administered 2/2 presentation outside the window. Exam c/f CRAO. Patient will be admitted to [facility] for second opinion from oncology and additional stroke workup.  Recommendations   - Admit for stroke workup - Permissive HTN x48 hrs from sx onset or until stroke ruled out by MRI goal BP <220/110. PRN labetalol or  hydralazine if BP above these parameters. Avoid oral antihypertensives. - MRI brain wo contrast - CTA or MRA H&N - TTE  - Check A1c and LDL + add statin per guidelines - ASA '81mg'$  daily + plavix '75mg'$  daily x21 days f/b ASA '81mg'$  daily monotherapy after that - q4 hr neuro checks - STAT head CT for any change in neuro exam - Tele - PT/OT/SLP - Stroke education - Amb referral to neurology upon discharge   Neurology will f/u remotely on results of stroke workup and make any further recommendations at that time.  ______________________________________________________________________   Thank you for the opportunity to take part in the care of this patient. If you have any further questions, please contact the neurology consultation attending.  Signed,  Su Monks, MD [facility] [phone]  If 7pm- 7am, please page neurology on call as listed in [place].  **Any copied and pasted documentation in this note was written by me in another application not billed for and pasted by me into this document.  Addendum:  MRI brain  1. Punctate focus of acute infarction along the left middle frontal gyrus. 2. Encephalomalacia with surrounding hemosiderin staining from prior infarcts in the left anterior cingulate gyrus and left lentiform nucleus. 3. 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  Infarct likely incidental as would not explain L eye acute vision loss, still warrants workup above. No changes to recommendations.

## 2022-05-04 NOTE — Evaluation (Signed)
SLP Cancellation Note  Patient Details Name: Monica Pugh MRN: [id number] DOB: [birth year]   Cancelled treatment:       Reason Eval/Treat Not Completed: Other (comment) (pt passed [facility] swallow screen; alerted RN; will follow up next date for speech evaluation as ordered)  Kathleen Lime, MS [place] Office [phone]  Macario Golds 05/04/2022, 6:10 PM

## 2022-05-05 ENCOUNTER — Inpatient Hospital Stay (HOSPITAL_COMMUNITY): Payer: Medicare HMO

## 2022-05-05 DIAGNOSIS — I6389 Other cerebral infarction: Secondary | ICD-10-CM | POA: Diagnosis not present

## 2022-05-05 DIAGNOSIS — E7220 Disorder of urea cycle metabolism, unspecified: Secondary | ICD-10-CM

## 2022-05-05 DIAGNOSIS — I63329 Cerebral infarction due to thrombosis of unspecified anterior cerebral artery: Secondary | ICD-10-CM

## 2022-05-05 DIAGNOSIS — N179 Acute kidney failure, unspecified: Secondary | ICD-10-CM

## 2022-05-05 DIAGNOSIS — C799 Secondary malignant neoplasm of unspecified site: Secondary | ICD-10-CM

## 2022-05-05 DIAGNOSIS — I63322 Cerebral infarction due to thrombosis of left anterior cerebral artery: Secondary | ICD-10-CM

## 2022-05-05 LAB — HEMOGLOBIN A1C
Hgb A1c MFr Bld: 6.3 % — ABNORMAL HIGH (ref 4.8–5.6)
Mean Plasma Glucose: 134 mg/dL

## 2022-05-05 LAB — ECHOCARDIOGRAM LIMITED
Height: 63.5 in
S' Lateral: 3 cm
Weight: 2848 oz

## 2022-05-05 LAB — LIPID PANEL
Cholesterol: 97 mg/dL (ref 0–200)
HDL: 31 mg/dL — ABNORMAL LOW (ref >40)
LDL Cholesterol: 50 mg/dL (ref 0–99)
Total CHOL/HDL Ratio: 3.1 RATIO
Triglycerides: 78 mg/dL (ref <150)
VLDL: 16 mg/dL (ref 0–40)

## 2022-05-05 MED ORDER — MORPHINE SULFATE 15 MG PO TABS
7.5000 mg | ORAL_TABLET | ORAL | Status: DC | PRN
  Administered 2022-05-05 – 2022-05-06 (×3): 7.5 mg via ORAL
  Filled 2022-05-05 (×3): qty 1

## 2022-05-05 MED ORDER — SENNOSIDES-DOCUSATE SODIUM 8.6-50 MG PO TABS
2.0000 | ORAL_TABLET | Freq: Two times a day (BID) | ORAL | Status: DC
  Administered 2022-05-05 – 2022-05-06 (×3): 2 via ORAL
  Filled 2022-05-05 (×3): qty 2

## 2022-05-05 NOTE — Progress Notes (Signed)
  Echocardiogram 2D Echocardiogram has been performed.  Monica Pugh 05/05/2022, 10:45 AM

## 2022-05-05 NOTE — Evaluation (Signed)
Occupational Therapy Evaluation and Discharge Patient Details Name: Monica Pugh MRN: [id number] DOB: Aug 11, 1950 Today's Date: 05/05/2022   History of Present Illness Monica Pugh is a 71 y.o. female presents this morning with altered mental status. [id number] focus of acute infarction along the left middle frontal  gyrus. PHMx: anemia, carotid disease, hyperlipidemia recently diagnosed metastatic ovarian cancer, was here 2 weeks ago with suspected infiltrated mass of the mid sigmoid colon.   Clinical Impression   This 72 yo female admitted with above presents to acute OT with PLOF of being independent with basic ADLs up until the day before admission. She currently is at a setup/S with min guard A level when up on her feet for basic ADLs. All education and equipment recommendations have been completed with family and placed in chart, we will D/C from acute OT.      Recommendations for follow up therapy are one component of a multi-disciplinary discharge planning process, led by the attending physician.  Recommendations may be updated based on patient status, additional functional criteria and insurance authorization.   Follow Up Recommendations  No OT follow up     Assistance Recommended at Discharge Frequent or constant Supervision/Assistance  Patient can return home with the following A little help with walking and/or transfers;A little help with bathing/dressing/bathroom;Help with stairs or ramp for entrance;Assistance with cooking/housework;Assist for transportation;Direct supervision/assist for financial management;Direct supervision/assist for medications management    Functional Status Assessment  Patient has had a recent decline in their functional status and demonstrates the ability to make significant improvements in function in a reasonable and predictable amount of time. (without futher need for skilled OT, all education and recommendations have been completed with pt and husband)   Equipment Recommendations  Tub/shower bench;BSC/3in1       Precautions / Restrictions Precautions Precautions: Fall Restrictions Weight Bearing Restrictions: No      Mobility Bed Mobility Overal bed mobility: Modified Independent             General bed mobility comments: HoB elevated    Transfers Overall transfer level: Needs assistance Equipment used: None Transfers: Sit to/from Stand Sit to Stand: Min guard           General transfer comment: ambulation to bathroom min guard A no AD      Balance Overall balance assessment: Needs assistance Sitting-balance support: No upper extremity supported, Feet supported Sitting balance-Leahy Scale: Good     Standing balance support: No upper extremity supported, During functional activity Standing balance-Leahy Scale: Fair Standing balance comment: standing at sink to wash hands; but when she turned to leave bathroom she had a slight LOB that she did correct                           ADL either performed or assessed with clinical judgement   ADL Overall ADL's : Needs assistance/impaired                                       General ADL Comments: setup/S with min guard A when up on her feet     Vision Patient Visual Report: Blurring of vision (left eye intermittently)              Pertinent Vitals/Pain Pain Assessment Pain Assessment: Faces Faces Pain Scale: Hurts a little bit Pain Location: left lower abdomen Pain Descriptors / Indicators: Sore  Pain Intervention(s): Limited activity within patient's tolerance, Monitored during session, Repositioned     Hand Dominance Right   Extremity/Trunk Assessment Upper Extremity Assessment Upper Extremity Assessment: RUE deficits/detail RUE Deficits / Details: slightly weak compared to LUE with GM and FM movements           Communication Communication Communication: HOH   Cognition Arousal/Alertness: Awake/alert Behavior  During Therapy: Flat affect Overall Cognitive Status: Impaired/Different from baseline                                 General Comments: Does not initiate any conversation. A & O x4, decreased safety awareness     General Comments  on RA pt dropped to 74% post getting up to EOB and sitting there ~2 minutes, applied 3 liters back to patient and sats upto 96%            Home Living Family/patient expects to be discharged to:: Private residence Living Arrangements: Spouse/significant other;Children;Other relatives Available Help at Discharge: Available 24 hours/day;Family Type of Home: House Home Access: Level entry     Home Layout: One level     Bathroom Shower/Tub: Tub/shower unit;Curtain   Bathroom Toilet: Standard     Home Equipment: Grab bars - tub/shower;Hand held shower head;Shower seat;Hospital bed   Additional Comments: O2      Prior Functioning/Environment Prior Level of Function : Independent/Modified Independent                        OT Problem List: Impaired balance (sitting and/or standing);Decreased safety awareness         OT Goals(Current goals can be found in the care plan section) Acute Rehab OT Goals Patient Stated Goal: to go home         AM-PAC OT "6 Clicks" Daily Activity     Outcome Measure Help from another person eating meals?: None Help from another person taking care of personal grooming?: A Little Help from another person toileting, which includes using toliet, bedpan, or urinal?: A Little Help from another person bathing (including washing, rinsing, drying)?: A Little Help from another person to put on and taking off regular upper body clothing?: A Little Help from another person to put on and taking off regular lower body clothing?: A Little 6 Click Score: 19   End of Session Equipment Utilized During Treatment: Gait belt Nurse Communication: Mobility status (secure chat. Same with NT)  Activity Tolerance:  Patient tolerated treatment well Patient left: in chair;with call bell/phone within reach;with chair alarm set  OT Visit Diagnosis: Unsteadiness on feet (R26.81);Other abnormalities of gait and mobility (R26.89);Muscle weakness (generalized) (M62.81);Pain Pain - Right/Left: Left Pain - part of body:  (lower abdomen)                Time: [id number] OT Time Calculation (min): 34 min Charges:  OT General Charges $OT Visit: 1 Visit OT Evaluation $OT Eval Moderate Complexity: 1 Mod OT Treatments $Self Care/Home Management : 8-22 mins  [place] Office [phone]    Almon Register 05/05/2022, 9:50 AM

## 2022-05-05 NOTE — Progress Notes (Signed)
   [place] office received a call from [place] [phone]) this am. She states that her Mother ln law, Monica Pugh, 11[birth year]19[birth year]52 is at [facility]. She reports  the family has received information that the pt. will need to go home with hospice possibly tomorrow. She is calling our office to see if we have gotten the referral information. We have instructed her to let the hospital know their wishes and speak with her oncologist as well to update them on the wishes and get there expert opinions. I have advised her that the hospital will make the referral to [facility] when they have the acquired knowledge of their choice/ goals.   If we can assist with this pt in any way please reach out to [place] [phone]. She will be happy to assist.   Webb Silversmith RN

## 2022-05-05 NOTE — Progress Notes (Signed)
SLP alerted by RN last night that spouse reports she frequently coughs with intake and concern is present for dysphagia.  Please order SLP swallow evaluation if agree. Thanks.   Kathleen Lime, MS [place] Office [phone]

## 2022-05-05 NOTE — Progress Notes (Signed)
[facility] PROGRESS NOTE   Monica Pugh [id number] DOB: [birth year] DOA: 05/04/2022  PCP: Bonnita Nasuti, MD  Brief History/Interval Summary: 72 y.o. female with medical history significant for anemia, carotid disease, hyperlipidemia recently diagnosed metastatic ovarian cancer presented with altered mental status.  Patient was experiencing right arm weakness and vision problems in her left eye.  She was diagnosed with acute stroke.  She was hospitalized for further management.    Consultants: Neurology  Procedures: Transthoracic echocardiogram is pending    Subjective/Interval History: Patient somewhat lethargic this morning.  Confused.  She mentions that she is able to see with her left eye and vision seems to be improving.  She is able to follow commands.  Husband is at the bedside.    Assessment/Plan:  Acute stroke MRI showed stroke in the left cerebral hemisphere.  No focal neurological deficits noted this morning. Patient with metastatic cancer which is likely reason for her stroke. LDL is 50.  HbA1c 6.3. She is currently on aspirin and Plavix.  Also on statin. Echocardiogram is pending. Considering her metastatic cancer and her functional quality of life which has been worsening according to the patient's husband we will not proceed with any further testing at this time.  Patient's husband wants to transition to hospice/comfort care.  Hyperammonemia Ammonia level was noted to be elevated.  Patient started on lactulose.  Having frequent bowel movements.  Will recheck ammonia level tomorrow morning.  Metastatic cancer Patient with previous history of endometrial cancer.  Was found to have a colonic mass.  Biopsy suggest that this is a recurrence of the endometrial cancer.  Patient was seen by medical oncology here as well as in [place].  Goal is mainly to provide palliative chemo.  This is not a curative cancer.  Husband mentions that her functional quality of life  has been worsening over the past few weeks.  Unlikely that the patient will be able to withstand chemotherapy.  Husband is now interested in transitioning to hospice which was something that was offered to them at oncology visitation. We will ask TOC to make referral to hospice. CT of the abdomen pelvis did raise concern for liver lesions. They are thought to be cysts but metastatic process cannot be ruled out.  Asians were also noted in the lung which could be new metastatic process.  Acute kidney injury Likely due to poor oral intake.  Also a poor prognostic sign.  Hyperkalemia Reason for elevated potassium level is not clear.  Noted to be normal when checked subsequently.  Microcytic anemia No evidence of overt bleeding.  This is also in the setting of metastatic cancer.    Obesity Estimated body mass index is 31.04 kg/m as calculated from the following:   Height as of this encounter: 5' 3.5" (1.613 m).   Weight as of this encounter: 80.7 kg.  Goals of care Patient with stage IV cancer.  Oncology note from March 6 reviewed.  According to that note patient's options include chemotherapy or hospice.  Patient was thought to be at high risk for complication due to her comorbidities and decreased performance status.  At that time patient and husband wanted to proceed with chemotherapy.  However upon further discussion with patient's husband today he feels that patient's condition has worsened in the last 2 to 3 weeks.  He he does not want to proceed with chemotherapy anymore.  Wants to transition to hospice/comfort care.  Will ask TOC to make referral to hospice.  Will  also notify palliative medicine.  DVT Prophylaxis: Subcutaneous heparin Code Status: DNR Family Communication: Discussed with patient's husband Disposition Plan: Hopefully home with hospice tomorrow  Status is: Inpatient Remains inpatient appropriate because: Acute kidney injury, acute stroke      Medications:  Scheduled:   stroke: early stages of recovery book   Does not apply Once   aspirin EC  81 mg Oral Daily   atorvastatin  40 mg Oral q AM   clopidogrel  75 mg Oral Daily   docusate sodium  100 mg Oral BID   heparin  5,000 Units Subcutaneous Q8H   lactulose  10 g Oral TID   mirtazapine  7.5 mg Oral QHS   pantoprazole  40 mg Oral q AM   senna-docusate  2 tablet Oral BID   Continuous:  sodium chloride 75 mL/hr at 05/05/22 0622   [id number] **OR** acetaminophen, morphine  Antibiotics: Anti-infectives (From admission, onward)    None       Objective:  Vital Signs  Vitals:   05/04/22 2048 05/05/22 0054 05/05/22 0127 05/05/22 0426  BP: (!) 156/51 (!) 175/73 (!) 166/64 (!) 165/61  Pulse: 74 73  85  Resp: '20 18  20  '$ Temp: 98.5 F (36.9 C) 98.1 F (36.7 C)  98.3 F (36.8 C)  TempSrc: Oral Oral  Axillary  SpO2: 97% 100%  93%  Weight:      Height:        Intake/Output Summary (Last 24 hours) at 05/05/2022 1048 Last data filed at 05/04/2022 2200 Gross per 24 hour  Intake 1478.71 ml  Output --  Net 1478.71 ml   Filed Weights   05/04/22 1039  Weight: 80.7 kg    General appearance: Awake alert.  In no distress.  Distracted.  Follows commands. Resp: Clear to auscultation bilaterally.  Normal effort Cardio: S1-S2 is normal regular.  No S3-S4.  No rubs murmurs or bruit GI: Abdomen is soft.  Nontender nondistended.  Bowel sounds are present normal.  No masses organomegaly Extremities: No edema.  Neurologic:  No focal neurological deficits.    Lab Results:  Data Reviewed: I have personally reviewed following labs and reports of the imaging studies  CBC: Recent Labs  Lab 05/04/22 1116 05/04/22 1209 05/04/22 1658  WBC 17.8*  --  15.3*  NEUTROABS 15.3*  --   --   HGB 9.7* 10.9* 9.0*  HCT 32.2* 32.0* 30.5*  MCV 77.8*  --  79.8*  PLT 433*  --  [id number]    Basic Metabolic Panel: Recent Labs  Lab 05/04/22 1116 05/04/22 1209 05/04/22 1658  NA 132* 129*  --    K 5.0 8.0* 4.8  CL 91* 94*  --   CO2 28  --   --   GLUCOSE 116* 109*  --   BUN 22 29*  --   CREATININE 1.63* 1.70*  --   CALCIUM 8.8*  --   --     GFR: Estimated Creatinine Clearance: 31.3 mL/min (A) (by C-G formula based on SCr of 1.7 mg/dL (H)).  Liver Function Tests: Recent Labs  Lab 05/04/22 1116  AST 198*  ALT 833*  ALKPHOS 105  BILITOT 0.8  PROT 6.7  ALBUMIN 3.1*    Recent Labs  Lab 05/04/22 1116  AMMONIA 110*    Coagulation Profile: Recent Labs  Lab 05/04/22 1116  INR 1.3*    HbA1C: Recent Labs    05/04/22 1658  HGBA1C 6.3*    CBG: Recent Labs  Lab 05/04/22 1036  GLUCAP 132*    Lipid Profile: Recent Labs    05/05/22 0421  CHOL 97  HDL 31*  LDLCALC 50  TRIG 78  CHOLHDL 3.1     Radiology Studies: CT ABDOMEN PELVIS W CONTRAST  Result Date: 05/04/2022 CLINICAL DATA:  Right-sided abdominal pain and new elevated liver function studies. History of bile malignancy. EXAM: CT ABDOMEN AND PELVIS WITH CONTRAST TECHNIQUE: Multidetector CT imaging of the abdomen and pelvis was performed using the standard protocol following bolus administration of intravenous contrast. RADIATION DOSE REDUCTION: This exam was performed according to the departmental dose-optimization program which includes automated exposure control, adjustment of the mA and/or kV according to patient size and/or use of iterative reconstruction technique. CONTRAST:  72m OMNIPAQUE IOHEXOL 300 MG/ML  SOLN COMPARISON:  04/16/2022 FINDINGS: Lower chest: 2 new adjacent nodules are noted at the right lung base. The larger more lateral lesion measures 17 mm. This is likely inflammatory or infectious. Follow-up full chest CT may be helpful. No pleural effusions. The heart is normal in size. Hiatal hernia noted with fluid in the distal esophagus. Hepatobiliary: A few small scattered low-attenuation hepatic lesions are stable. These are indeterminate but certainly could not exclude metastasis.  Heterogeneous appearance of the liver parenchyma may suggest hepatitis this also could be related to non opacification of the hepatic veins. The liver parenchyma appears more normal on the delayed images. No worrisome hepatic lesions. No intrahepatic biliary dilatation. The gallbladder is unremarkable. No common bile duct dilatation. Pancreas: Normal Spleen: Normal Adrenals/Urinary Tract: Normal Stomach/Bowel: The stomach, duodenum, small bowel and colon are grossly normal. No acute inflammatory process, mass lesions or obstructive findings. Persistent asymmetric enhancing partially circumferential mass involving the sigmoid colon consistent with colon cancer. Small lymph nodes again noted in the sigmoid mesocolon. Vascular/Lymphatic: Bilateral common iliac artery stents. Advanced aortoiliac vascular disease. The major venous structures are patent. Necrotic left para-aortic lymphadenopathy again demonstrated. There is also a E soft tissue lesion in the anterior left pelvis anterior to the sigmoid colon which measures 14 mm and is suspicious for a metastatic implant. Reproductive: The uterus is surgically absent. The right ovary is still present. I do not see the left ovary for certain. Other: Small inguinal hernias containing fat. No subcutaneous lesions. Musculoskeletal: The bony structures are. IMPRESSION: 1. Persistent asymmetric enhancing partially circumferential mass involving the sigmoid colon consistent with colon cancer. 2. Necrotic left para-aortic lymphadenopathy. 3. 14 mm soft tissue lesion in the anterior left pelvis anterior to the sigmoid colon is suspicious for a metastatic implant. 4. Stable small scattered low-attenuation hepatic lesions. These are indeterminate but certainly could not exclude metastasis. 5. Heterogeneous appearance of the liver parenchyma may suggest hepatitis or perfusion abnormality. No biliary obstruction. 6. 2 new adjacent nodules at the right lung base. This is likely  inflammatory or infectious but could not exclude new metastatic disease. Follow-up full chest CT may be helpful. 7. Advanced aortoiliac vascular disease. Electronically Signed   By: PMarijo SanesM.D.   On: 05/04/2022 13:48   MR BRAIN W WO CONTRAST  Result Date: 05/04/2022 CLINICAL DATA:  Transient ischemic attack. EXAM: MRI HEAD WITHOUT AND WITH CONTRAST TECHNIQUE: Multiplanar, multiecho pulse sequences of the brain and surrounding structures were obtained without and with intravenous contrast. CONTRAST:  87mGADAVIST GADOBUTROL 1 MMOL/ML IV SOLN COMPARISON:  Brain MRI 05/10/2015.  Head CT 05/04/2022. FINDINGS: Brain: Punctate focus of acute infarction along the left middle frontal gyrus (coronal image 21 series 6). No acute intracranial hemorrhage. Encephalomalacia with  surrounding hemosiderin staining from prior infarcts in the left anterior cingulate gyrus and left lentiform nucleus. Superficial siderosis along the left superior frontal sulcus. No mass or midline shift. No hydrocephalus or extra-axial collection. 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 No foci of abnormal enhancement to suggest intracranial metastasis. Vascular: Unchanged chronic loss of the right ICA flow void. Skull and upper cervical spine: Normal marrow signal and enhancement. Sinuses/Orbits: Unremarkable. Other: None. IMPRESSION: 1. Punctate focus of acute infarction along the left middle frontal gyrus. 2. Encephalomalacia with surrounding hemosiderin staining from prior infarcts in the left anterior cingulate gyrus and left lentiform nucleus. 3. 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 Electronically Signed   By: Emmit Alexanders M.D.   On: 05/04/2022 13:08   CT HEAD CODE STROKE WO CONTRAST  Result Date: 05/04/2022 CLINICAL DATA:  Code stroke.  Vision loss left eye EXAM: CT HEAD WITHOUT CONTRAST TECHNIQUE: Contiguous axial images were obtained  from the base of the skull through the vertex without intravenous contrast. RADIATION DOSE REDUCTION: This exam was performed according to the departmental dose-optimization program which includes automated exposure control, adjustment of the mA and/or kV according to patient size and/or use of iterative reconstruction technique. COMPARISON:  CT Head 04/08/19 FINDINGS: Brain: No hemorrhage. No hydrocephalus. No extra-axial fluid collection. Redemonstrated is a chronic infarct in the medial left frontal lobe and in the left lentiform nucleus. No CT evidence of a new cortical infarct. Vascular: No hyperdense vessel or unexpected calcification. Skull: Normal. Negative for fracture or focal lesion. Sinuses/Orbits: No middle ear or mastoid effusion. Paranasal sinuses are clear. Orbits are unremarkable. Other: None. IMPRESSION: No hemorrhage or CT evidence of a new cortical infarct Findings were discussed with Dr. Langston Masker on 05/04/22 at 10:54 AM Electronically Signed   By: Marin Roberts M.D.   On: 05/04/2022 10:55       LOS: 1 day   [facility] Pager on [email or web address]  05/05/2022, 10:48 AM

## 2022-05-05 NOTE — Progress Notes (Signed)
   Referral was made to [facility] for hospice services at home from [facility]. Spoke to the spouse Chrissie Noa about hospice services. He confirms wanting to switch from a curative approach to focus on making pt more comfortable after the findings of liver involvement and her new stroke. He confirms she has a DNR and reports having a hospital bed and oxygen in the home He feels that he will need a BSC and we have taken care of this order. I have discussed with our provider and the pt has been approved for hospice care at home. We will be prepared to admit same day once she is d/c home. Webb Silversmith RN [phone]

## 2022-05-05 NOTE — Evaluation (Signed)
SLP Cancellation Note  Patient Details Name: Aretina Fingerhut MRN: [id number] DOB: Feb 09, 1951   Cancelled treatment:       Reason Eval/Treat Not Completed: Other (comment) (pt now comfort care, SLP will sign off)  Kathleen Lime, MS [facility] SLP Acute Rehab Services Office [phone]   Macario Golds 05/05/2022, 7:16 PM

## 2022-05-05 NOTE — Progress Notes (Signed)
Pt transitioned to comfort care. Neurology will signoff.  [place] Pager Number [phone]

## 2022-05-05 NOTE — TOC Progression Note (Signed)
Transition of Care [facility]) - Progression Note    Patient Details  Name: Larine Pruski MRN: [id number] Date of Birth: 1950/07/18  Transition of [facility]) CM/SW [place], LCSW Phone Number: 05/05/2022, 11:36 AM  Clinical Narrative:    CSW received consult for home hospice referral. CSW spoke with pt's husband to offer choice, pt's husband chose [facility]. CSW made referral with [facility]. TOC to follow for d/c needs.    Expected Discharge Plan: Home w Hospice Care Barriers to Discharge: Continued Medical Work up  Expected Discharge Plan and Services       Living arrangements for the past 2 months: Single Family Home                                       Social Determinants of Health (SDOH) Interventions SDOH Screenings   Food Insecurity: No Food Insecurity (05/04/2022)  Housing: Low Risk  (05/04/2022)  Transportation Needs: No Transportation Needs (05/04/2022)  Utilities: Not At Risk (05/04/2022)  Depression (PHQ2-9): Low Risk  (04/06/2022)  Tobacco Use: High Risk (05/04/2022)    Readmission Risk Interventions     No data to display

## 2022-05-05 NOTE — Progress Notes (Addendum)
PT Cancellation Note/ SCREEN  Patient Details Name: Monica Pugh MRN: [id number] DOB: [birth year]   Cancelled Treatment:    Reason Eval/Treat Not Completed: PT screened, no needs identified, will sign off  Pt declined to participate.  Plans are currently for comfort care and home with hospice.  Spouse reports pt just up to bathroom and SOB.  Pt will likely need to conserve energy for ADLs at this time.  PT to sign off at this time.   Myrtis Hopping [place] 05/05/2022, 2:40 PM Arlyce Dice, DPT Physical Therapist Acute Rehabilitation Services Preferred contact method: Secure Chat Weekend Pager Only: [phone] Office: [phone]

## 2022-05-06 ENCOUNTER — Ambulatory Visit: Payer: Medicare HMO

## 2022-05-06 ENCOUNTER — Other Ambulatory Visit: Payer: Self-pay | Admitting: Pharmacist

## 2022-05-06 LAB — CBC
HCT: 27.3 % — ABNORMAL LOW (ref 36.0–46.0)
Hemoglobin: 8.4 g/dL — ABNORMAL LOW (ref 12.0–15.0)
MCH: 23.7 pg — ABNORMAL LOW (ref 26.0–34.0)
MCHC: 30.8 g/dL (ref 30.0–36.0)
MCV: 76.9 fL — ABNORMAL LOW (ref 80.0–100.0)
Platelets: 340 10*3/uL (ref 150–400)
RBC: 3.55 MIL/uL — ABNORMAL LOW (ref 3.87–5.11)
RDW: 19.6 % — ABNORMAL HIGH (ref 11.5–15.5)
WBC: 12.2 10*3/uL — ABNORMAL HIGH (ref 4.0–10.5)
nRBC: 0 % (ref 0.0–0.2)

## 2022-05-06 LAB — BASIC METABOLIC PANEL
Anion gap: 9 (ref 5–15)
BUN: 13 mg/dL (ref 8–23)
CO2: 25 mmol/L (ref 22–32)
Calcium: 8.3 mg/dL — ABNORMAL LOW (ref 8.9–10.3)
Chloride: 98 mmol/L (ref 98–111)
Creatinine, Ser: 1.04 mg/dL — ABNORMAL HIGH (ref 0.44–1.00)
GFR, Estimated: 58 mL/min — ABNORMAL LOW (ref >60)
Glucose, Bld: 79 mg/dL (ref 70–99)
Potassium: 3.7 mmol/L (ref 3.5–5.1)
Sodium: 132 mmol/L — ABNORMAL LOW (ref 135–145)

## 2022-05-06 LAB — AMMONIA: Ammonia: 16 umol/L (ref 9–35)

## 2022-05-06 MED ORDER — ALPRAZOLAM 0.25 MG PO TABS: 0.2500 mg | ORAL_TABLET | Freq: Three times a day (TID) | ORAL | 0 refills | Status: DC | PRN

## 2022-05-06 MED ORDER — MORPHINE SULFATE (PF) 2 MG/ML IV SOLN: 2.0000 mg | Freq: Once | INTRAVENOUS | Status: DC

## 2022-05-06 MED ORDER — LACTULOSE 10 GM/15ML PO SOLN: 10.0000 g | Freq: Three times a day (TID) | ORAL | 0 refills | Status: DC

## 2022-05-06 MED ORDER — MORPHINE SULFATE 15 MG PO TABS: 7.5000 mg | ORAL_TABLET | ORAL | 0 refills | Status: DC | PRN

## 2022-05-06 MED ORDER — MORPHINE SULFATE (PF) 2 MG/ML IV SOLN: 1.5000 mg | Freq: Once | INTRAVENOUS | Status: DC

## 2022-05-06 NOTE — Discharge Summary (Signed)
[facility]  Physician Discharge Summary   Patient ID: Monica Pugh MRN: [id number] DOB/AGE: 09/14/1950 72 y.o.  Admit date: 05/04/2022 Discharge date: 05/06/2022    PCP: Bonnita Nasuti, MD  DISCHARGE DIAGNOSES:   Acute ischemic stroke ([place])   Chronic diastolic heart failure ([place])   Essential hypertension   GERD (gastroesophageal reflux disease)   Hypothyroidism   Hyperlipidemia Stage 4  Endometrial cancer   AKI (acute kidney injury) ([place])     RECOMMENDATIONS FOR OUTPATIENT FOLLOW UP: Hospice to follow patient at home.    CODE STATUS: DNR  DISCHARGE CONDITION: fair  Diet recommendation: Regular as tolerated  INITIAL HISTORY: 72 y.o. female with medical history significant for anemia, carotid disease, hyperlipidemia recently diagnosed metastatic ovarian cancer presented with altered mental status.  Patient was experiencing right arm weakness and vision problems in her left eye.  She was diagnosed with acute stroke.  She was hospitalized for further management.     Consultants: Neurology   Procedures: Transthoracic echocardiogram   HOSPITAL COURSE:   Acute stroke MRI showed stroke in the left cerebral hemisphere.   Patient with metastatic cancer which is likely reason for her stroke. LDL is 50.  HbA1c 6.3. She is on aspirin and Plavix.  Also on statin. Patient underwent echocardiogram as well. Considering her metastatic cancer and her functional quality of life which has been worsening according to the patient's husband we will not proceed with any further testing at this time.  Patient's husband wants to transition to hospice/comfort care.   Hyperammonemia Ammonia level was noted to be elevated.  Patient started on lactulose.  Improvement in ammonia level noted.    Metastatic cancer Patient with previous history of endometrial cancer.  Was found to have a colonic mass.  Biopsy suggest that this is a recurrence of the endometrial cancer.  Patient was seen by  medical oncology here as well as in [place].  Goal is mainly to provide palliative chemo.  This is not a curative cancer.  Husband mentions that her functional quality of life has been worsening over the past few weeks.  Unlikely that the patient will be able to withstand chemotherapy.  Husband is now interested in transitioning to hospice which was something that was offered to them at oncology visitation. CT of the abdomen pelvis did raise concern for liver lesions. They are thought to be cysts but metastatic process cannot be ruled out.  Lesions were also noted in the lung which could be new metastatic process.   Acute kidney injury Likely due to poor oral intake.  Improved with hydration.   Hyperkalemia Reason for elevated potassium level is not clear.  Noted to be normal when checked subsequently.   Microcytic anemia No evidence of overt bleeding.  This is also in the setting of metastatic cancer.     Obesity Estimated body mass index is 31.04 kg/m as calculated from the following:   Height as of this encounter: 5' 3.5" (1.613 m).   Weight as of this encounter: 80.7 kg.   Goals of care Patient with stage IV cancer.  Oncology note from March 6 reviewed.  According to that note patient's options include chemotherapy or hospice.  Patient was thought to be at high risk for complication due to her comorbidities and decreased performance status.  At that time patient and husband wanted to proceed with chemotherapy.  However upon further discussion with patient's husband he feels that patient's condition has worsened in the last 2 to 3 weeks.  He does not want to proceed with chemotherapy anymore.  Wants to transition to hospice/comfort care.  Patient was seen by hospice.  Discussed with oncology who also agrees.  Will be discharged with hospice services at home.   PERTINENT LABS:  The results of significant diagnostics from this hospitalization (including imaging, microbiology, ancillary and  laboratory) are listed below for reference.      Labs:   Basic Metabolic Panel: Recent Labs  Lab 05/04/22 1116 05/04/22 1209 05/04/22 1658 05/06/22 0503  NA 132* 129*  --  132*  K 5.0 8.0* 4.8 3.7  CL 91* 94*  --  98  CO2 28  --   --  25  GLUCOSE 116* 109*  --  79  BUN 22 29*  --  13  CREATININE 1.63* 1.70*  --  1.04*  CALCIUM 8.8*  --   --  8.3*   Liver Function Tests: Recent Labs  Lab 05/04/22 1116  AST 198*  ALT 833*  ALKPHOS 105  BILITOT 0.8  PROT 6.7  ALBUMIN 3.1*    Recent Labs  Lab 05/04/22 1116 05/06/22 0503  AMMONIA 110* 16   CBC: Recent Labs  Lab 05/04/22 1116 05/04/22 1209 05/04/22 1658 05/06/22 0503  WBC 17.8*  --  15.3* 12.2*  NEUTROABS 15.3*  --   --   --   HGB 9.7* 10.9* 9.0* 8.4*  HCT 32.2* 32.0* 30.5* 27.3*  MCV 77.8*  --  79.8* 76.9*  PLT 433*  --  378 340     CBG: Recent Labs  Lab 05/04/22 1036  GLUCAP 132*     IMAGING STUDIES ECHOCARDIOGRAM LIMITED  Result Date: 05/05/2022    ECHOCARDIOGRAM LIMITED REPORT   Patient Name:   Monica Pugh Date of Exam: 05/05/2022 Medical Rec #:  [id number]  Height:       63.5 in Accession #:    [id number] Weight:       178.0 lb Date of Birth:  1950-08-29   BSA:          1.851 m Patient Age:    72 years   BP:           165/61 mmHg Patient Gender: F          HR:           76 bpm. Exam Location:  Inpatient Procedure: Limited Echo, Cardiac Doppler and Color Doppler Indications:    Stroke  History:        Patient has prior history of Echocardiogram examinations, most                 recent 04/19/2022. Signs/Symptoms:Edema; Risk Factors:Current                 Smoker, Sleep Apnea and Diabetes. Cancer.  Sonographer:    Roseanna Rainbow RDCS Referring Phys: [id number] MIR [facility]  Sonographer Comments: Image acquisition challenging due to patient body habitus. IMPRESSIONS  1. Left ventricular ejection fraction, by estimation, is 60 to 65%. The left ventricle has normal function. The left ventricle has no regional wall  motion abnormalities. Left ventricular diastolic function could not be evaluated.  2. Right ventricular systolic function is normal. The right ventricular size is normal.  3. The mitral valve is normal in structure. Mild mitral valve regurgitation. No evidence of mitral stenosis.  4. The aortic valve is normal in structure. Aortic valve regurgitation is not visualized. No aortic stenosis is present.  5. The inferior vena cava is dilated in size  with <50% respiratory variability, suggesting right atrial pressure of 15 mmHg. FINDINGS  Left Ventricle: Left ventricular ejection fraction, by estimation, is 60 to 65%. The left ventricle has normal function. The left ventricle has no regional wall motion abnormalities. The left ventricular internal cavity size was normal in size. There is  no left ventricular hypertrophy. Left ventricular diastolic function could not be evaluated. Right Ventricle: The right ventricular size is normal. No increase in right ventricular wall thickness. Right ventricular systolic function is normal. Left Atrium: Left atrial size was normal in size. Right Atrium: Right atrial size was normal in size. Pericardium: There is no evidence of pericardial effusion. Mitral Valve: The mitral valve is normal in structure. Mild mitral valve regurgitation. No evidence of mitral valve stenosis. Tricuspid Valve: The tricuspid valve is normal in structure. Tricuspid valve regurgitation is mild . No evidence of tricuspid stenosis. Aortic Valve: The aortic valve is normal in structure. Aortic valve regurgitation is not visualized. No aortic stenosis is present. Pulmonic Valve: The pulmonic valve was grossly normal. Pulmonic valve regurgitation is not visualized. No evidence of pulmonic stenosis. Aorta: The aortic root is normal in size and structure. Venous: The inferior vena cava is dilated in size with less than 50% respiratory variability, suggesting right atrial pressure of 15 mmHg. IAS/Shunts: No atrial  level shunt detected by color flow Doppler. Additional Comments: Spectral Doppler performed. Color Doppler performed.  LEFT VENTRICLE PLAX 2D LVIDd:         4.50 cm LVIDs:         3.00 cm LV PW:         1.00 cm LV IVS:        0.90 cm  IVC IVC diam: 2.50 cm LEFT ATRIUM         Index LA diam:    3.70 cm 2.00 cm/m   AORTA Ao Root diam: 3.00 cm Ao Asc diam:  3.00 cm TRICUSPID VALVE TR Peak grad:   35.3 mmHg TR Vmax:        297.00 cm/s Glori Bickers MD Electronically signed by Glori Bickers MD Signature Date/Time: 05/05/2022/11:12:03 AM    Final    CT ABDOMEN PELVIS W CONTRAST  Result Date: 05/04/2022 CLINICAL DATA:  Right-sided abdominal pain and new elevated liver function studies. History of bile malignancy. EXAM: CT ABDOMEN AND PELVIS WITH CONTRAST TECHNIQUE: Multidetector CT imaging of the abdomen and pelvis was performed using the standard protocol following bolus administration of intravenous contrast. RADIATION DOSE REDUCTION: This exam was performed according to the departmental dose-optimization program which includes automated exposure control, adjustment of the mA and/or kV according to patient size and/or use of iterative reconstruction technique. CONTRAST:  53mL OMNIPAQUE IOHEXOL 300 MG/ML  SOLN COMPARISON:  04/16/2022 FINDINGS: Lower chest: 2 new adjacent nodules are noted at the right lung base. The larger more lateral lesion measures 17 mm. This is likely inflammatory or infectious. Follow-up full chest CT may be helpful. No pleural effusions. The heart is normal in size. Hiatal hernia noted with fluid in the distal esophagus. Hepatobiliary: A few small scattered low-attenuation hepatic lesions are stable. These are indeterminate but certainly could not exclude metastasis. Heterogeneous appearance of the liver parenchyma may suggest hepatitis this also could be related to non opacification of the hepatic veins. The liver parenchyma appears more normal on the delayed images. No worrisome hepatic  lesions. No intrahepatic biliary dilatation. The gallbladder is unremarkable. No common bile duct dilatation. Pancreas: Normal Spleen: Normal Adrenals/Urinary Tract: Normal Stomach/Bowel: The  stomach, duodenum, small bowel and colon are grossly normal. No acute inflammatory process, mass lesions or obstructive findings. Persistent asymmetric enhancing partially circumferential mass involving the sigmoid colon consistent with colon cancer. Small lymph nodes again noted in the sigmoid mesocolon. Vascular/Lymphatic: Bilateral common iliac artery stents. Advanced aortoiliac vascular disease. The major venous structures are patent. Necrotic left para-aortic lymphadenopathy again demonstrated. There is also a E soft tissue lesion in the anterior left pelvis anterior to the sigmoid colon which measures 14 mm and is suspicious for a metastatic implant. Reproductive: The uterus is surgically absent. The right ovary is still present. I do not see the left ovary for certain. Other: Small inguinal hernias containing fat. No subcutaneous lesions. Musculoskeletal: The bony structures are. IMPRESSION: 1. Persistent asymmetric enhancing partially circumferential mass involving the sigmoid colon consistent with colon cancer. 2. Necrotic left para-aortic lymphadenopathy. 3. 14 mm soft tissue lesion in the anterior left pelvis anterior to the sigmoid colon is suspicious for a metastatic implant. 4. Stable small scattered low-attenuation hepatic lesions. These are indeterminate but certainly could not exclude metastasis. 5. Heterogeneous appearance of the liver parenchyma may suggest hepatitis or perfusion abnormality. No biliary obstruction. 6. 2 new adjacent nodules at the right lung base. This is likely inflammatory or infectious but could not exclude new metastatic disease. Follow-up full chest CT may be helpful. 7. Advanced aortoiliac vascular disease. Electronically Signed   By: Marijo Sanes M.D.   On: 05/04/2022 13:48   MR  BRAIN W WO CONTRAST  Result Date: 05/04/2022 CLINICAL DATA:  Transient ischemic attack. EXAM: MRI HEAD WITHOUT AND WITH CONTRAST TECHNIQUE: Multiplanar, multiecho pulse sequences of the brain and surrounding structures were obtained without and with intravenous contrast. CONTRAST:  1mL GADAVIST GADOBUTROL 1 MMOL/ML IV SOLN COMPARISON:  Brain MRI 05/10/2015.  Head CT 05/04/2022. FINDINGS: Brain: Punctate focus of acute infarction along the left middle frontal gyrus (coronal image 21 series 6). No acute intracranial hemorrhage. Encephalomalacia with surrounding hemosiderin staining from prior infarcts in the left anterior cingulate gyrus and left lentiform nucleus. Superficial siderosis along the left superior frontal sulcus. No mass or midline shift. No hydrocephalus or extra-axial collection. 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 No foci of abnormal enhancement to suggest intracranial metastasis. Vascular: Unchanged chronic loss of the right ICA flow void. Skull and upper cervical spine: Normal marrow signal and enhancement. Sinuses/Orbits: Unremarkable. Other: None. IMPRESSION: 1. Punctate focus of acute infarction along the left middle frontal gyrus. 2. Encephalomalacia with surrounding hemosiderin staining from prior infarcts in the left anterior cingulate gyrus and left lentiform nucleus. 3. 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 Electronically Signed   By: Emmit Alexanders M.D.   On: 05/04/2022 13:08   CT HEAD CODE STROKE WO CONTRAST  Result Date: 05/04/2022 CLINICAL DATA:  Code stroke.  Vision loss left eye EXAM: CT HEAD WITHOUT CONTRAST TECHNIQUE: Contiguous axial images were obtained from the base of the skull through the vertex without intravenous contrast. RADIATION DOSE REDUCTION: This exam was performed according to the departmental dose-optimization program which includes automated exposure control,  adjustment of the mA and/or kV according to patient size and/or use of iterative reconstruction technique. COMPARISON:  CT Head 04/08/19 FINDINGS: Brain: No hemorrhage. No hydrocephalus. No extra-axial fluid collection. Redemonstrated is a chronic infarct in the medial left frontal lobe and in the left lentiform nucleus. No CT evidence of a new cortical infarct. Vascular: No hyperdense vessel or unexpected calcification. Skull:  Normal. Negative for fracture or focal lesion. Sinuses/Orbits: No middle ear or mastoid effusion. Paranasal sinuses are clear. Orbits are unremarkable. Other: None. IMPRESSION: No hemorrhage or CT evidence of a new cortical infarct Findings were discussed with Dr. Langston Masker on 05/04/22 at 10:54 AM Electronically Signed   By: Marin Roberts M.D.   On: 05/04/2022 10:55     DISCHARGE EXAMINATION: Vitals:   05/05/22 2300 05/06/22 0145 05/06/22 0418 05/06/22 0629  BP: (!) 174/75 (!) 148/59 (!) 172/67 (!) 162/65  Pulse: 82  76   Resp: (!) 21  18   Temp: 98.1 F (36.7 C)  99.1 F (37.3 C)   TempSrc: Oral  Oral   SpO2: 99%  99%   Weight:      Height:       General appearance: Awake alert.  In no distress Resp: Clear to auscultation bilaterally.  Normal effort Cardio: S1-S2 is normal regular.  No S3-S4.  No rubs murmurs or bruit GI: Abdomen is soft.  Nontender nondistended.  Bowel sounds are present normal.  No masses organomegaly    DISPOSITION: Home with hospice  Discharge Instructions     Call MD for:  difficulty breathing, headache or visual disturbances   Complete by: As directed    Call MD for:  extreme fatigue   Complete by: As directed    Call MD for:  persistant dizziness or light-headedness   Complete by: As directed    Call MD for:  persistant nausea and vomiting   Complete by: As directed    Call MD for:  severe uncontrolled pain   Complete by: As directed    Call MD for:  temperature >100.4   Complete by: As directed    Diet general   Complete by: As  directed    Increase activity slowly   Complete by: As directed          Allergies as of 05/06/2022       Reactions   Cefadroxil Other (See Comments)   unknown        Medication List     STOP taking these medications    atorvastatin 40 MG tablet Commonly known as: LIPITOR   Dialyvite Vitamin D 5000 125 MCG (5000 UT) capsule Generic drug: Cholecalciferol   oxyCODONE 5 MG immediate release tablet Commonly known as: Oxy IR/ROXICODONE   potassium chloride SA 20 MEQ tablet Commonly known as: KLOR-CON M   senna-docusate 8.6-50 MG tablet Commonly known as: Senokot-S   Vitamin B-12 2500 MCG Subl       TAKE these medications    ALPRAZolam 0.25 MG tablet Commonly known as: XANAX Take 1 tablet (0.25 mg total) by mouth 3 (three) times daily as needed for anxiety.   aspirin EC 81 MG tablet Take 81 mg by mouth in the morning. Swallow whole.   clopidogrel 75 MG tablet Commonly known as: PLAVIX Take 1 tablet (75 mg total) by mouth daily with breakfast.   lactulose 10 GM/15ML solution Commonly known as: CHRONULAC Take 15 mLs (10 g total) by mouth 3 (three) times daily.   Lasix 20 MG tablet Generic drug: furosemide Take 20 mg by mouth daily as needed for fluid.   mirtazapine 7.5 MG tablet Commonly known as: REMERON Take 1 tablet (7.5 mg total) by mouth at bedtime.   morphine 15 MG tablet Commonly known as: MSIR Take 0.5 tablets (7.5 mg total) by mouth every 4 (four) hours as needed for severe pain.   pantoprazole 40 MG tablet  Commonly known as: PROTONIX Take 1 tablet (40 mg total) by mouth 2 (two) times daily. What changed: when to take this          Follow-up Information     [place], Rosalyn Charters, MD Follow up.   Specialty: Internal Medicine Why: As needed Contact information: [address] [place] [address] [phone]                 TOTAL DISCHARGE TIME: 31 minutes  [facility] [place]  [facility] Pager on  [email or web address]  05/06/2022, 12:26 PM

## 2022-05-19 LAB — POCT I-STAT, CHEM 8
BUN: 29 mg/dL — ABNORMAL HIGH (ref 8–23)
Calcium, Ion: 1.03 mmol/L — ABNORMAL LOW (ref 1.15–1.40)
Chloride: 94 mmol/L — ABNORMAL LOW (ref 98–111)
Creatinine, Ser: 1.7 mg/dL — ABNORMAL HIGH (ref 0.44–1.00)
Glucose, Bld: 109 mg/dL — ABNORMAL HIGH (ref 70–99)
HCT: 32 % — ABNORMAL LOW (ref 36.0–46.0)
Hemoglobin: 10.9 g/dL — ABNORMAL LOW (ref 12.0–15.0)
Potassium: 8 mmol/L (ref 3.5–5.1)
Sodium: 129 mmol/L — ABNORMAL LOW (ref 135–145)
TCO2: 34 mmol/L — ABNORMAL HIGH (ref 22–32)

## 2022-06-22 DEATH — deceased
